# Patient Record
Sex: Female | Born: 1970 | Race: White | Hispanic: No | Smoking: Former smoker
Health system: Southern US, Community
[De-identification: ages and names within clinical notes are randomized; demographics above are authoritative.]

## PROBLEM LIST (undated history)

## (undated) DIAGNOSIS — G35 Multiple sclerosis: Secondary | ICD-10-CM

## (undated) DIAGNOSIS — G35D Multiple sclerosis, unspecified: Secondary | ICD-10-CM

## (undated) DIAGNOSIS — R42 Dizziness and giddiness: Secondary | ICD-10-CM

## (undated) DIAGNOSIS — R519 Headache, unspecified: Secondary | ICD-10-CM

## (undated) DIAGNOSIS — R200 Anesthesia of skin: Secondary | ICD-10-CM

## (undated) DIAGNOSIS — R202 Paresthesia of skin: Secondary | ICD-10-CM

## (undated) DIAGNOSIS — Z972 Presence of dental prosthetic device (complete) (partial): Secondary | ICD-10-CM

## (undated) DIAGNOSIS — M1990 Unspecified osteoarthritis, unspecified site: Secondary | ICD-10-CM

## (undated) DIAGNOSIS — E119 Type 2 diabetes mellitus without complications: Secondary | ICD-10-CM

## (undated) DIAGNOSIS — K219 Gastro-esophageal reflux disease without esophagitis: Secondary | ICD-10-CM

## (undated) DIAGNOSIS — G8918 Other acute postprocedural pain: Secondary | ICD-10-CM

## (undated) HISTORY — PX: TUMOR REMOVAL: SHX12

## (undated) HISTORY — PX: ABDOMINAL HYSTERECTOMY: SHX81

---
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
## 2016-11-23 DIAGNOSIS — G35 Multiple sclerosis: Secondary | ICD-10-CM | POA: Insufficient documentation

## 2016-11-23 DIAGNOSIS — G40309 Generalized idiopathic epilepsy and epileptic syndromes, not intractable, without status epilepticus: Secondary | ICD-10-CM | POA: Insufficient documentation

## 2016-11-23 DIAGNOSIS — R251 Tremor, unspecified: Secondary | ICD-10-CM | POA: Insufficient documentation

## 2016-12-21 ENCOUNTER — Other Ambulatory Visit: Payer: Self-pay | Admitting: Neurology

## 2016-12-21 DIAGNOSIS — G35 Multiple sclerosis: Secondary | ICD-10-CM

## 2017-01-04 ENCOUNTER — Ambulatory Visit: Payer: Medicaid Other

## 2017-01-09 ENCOUNTER — Ambulatory Visit: Payer: Medicaid Other | Attending: Neurology | Admitting: Physical Therapy

## 2017-01-10 ENCOUNTER — Ambulatory Visit
Admission: RE | Admit: 2017-01-10 | Discharge: 2017-01-10 | Disposition: A | Discharge status: Home / Self Care | Payer: Medicaid Other | Source: Ambulatory Visit | Attending: Neurology | Admitting: Neurology

## 2017-01-10 DIAGNOSIS — G35 Multiple sclerosis: Secondary | ICD-10-CM | POA: Diagnosis present

## 2017-01-10 DIAGNOSIS — G35D Multiple sclerosis, unspecified: Secondary | ICD-10-CM

## 2017-01-10 MED ORDER — GADOBENATE DIMEGLUMINE 529 MG/ML IV SOLN
20.0000 mL | Freq: Once | INTRAVENOUS | Status: AC | PRN
  Administered 2017-01-10: 16 mL via INTRAVENOUS

## 2017-01-30 DIAGNOSIS — G8929 Other chronic pain: Secondary | ICD-10-CM | POA: Insufficient documentation

## 2017-01-30 DIAGNOSIS — M533 Sacrococcygeal disorders, not elsewhere classified: Secondary | ICD-10-CM | POA: Insufficient documentation

## 2017-06-10 DIAGNOSIS — Z79899 Other long term (current) drug therapy: Secondary | ICD-10-CM | POA: Insufficient documentation

## 2017-06-18 ENCOUNTER — Other Ambulatory Visit: Payer: Self-pay | Admitting: Neurology

## 2017-06-18 DIAGNOSIS — M545 Low back pain: Secondary | ICD-10-CM

## 2017-07-02 ENCOUNTER — Ambulatory Visit
Admission: RE | Admit: 2017-07-02 | Discharge: 2017-07-02 | Disposition: A | Discharge status: Home / Self Care | Payer: Medicaid Other | Source: Ambulatory Visit | Attending: Neurology | Admitting: Neurology

## 2017-07-02 DIAGNOSIS — G8929 Other chronic pain: Secondary | ICD-10-CM | POA: Diagnosis not present

## 2017-07-02 DIAGNOSIS — M5136 Other intervertebral disc degeneration, lumbar region: Secondary | ICD-10-CM | POA: Insufficient documentation

## 2017-07-02 DIAGNOSIS — M5126 Other intervertebral disc displacement, lumbar region: Secondary | ICD-10-CM | POA: Insufficient documentation

## 2017-07-02 DIAGNOSIS — M5137 Other intervertebral disc degeneration, lumbosacral region: Secondary | ICD-10-CM | POA: Diagnosis not present

## 2017-07-02 DIAGNOSIS — M545 Low back pain: Secondary | ICD-10-CM

## 2017-11-12 ENCOUNTER — Ambulatory Visit: Payer: Medicaid Other | Admitting: Physical Therapy

## 2017-11-18 ENCOUNTER — Ambulatory Visit: Payer: Medicaid Other | Attending: Anesthesiology

## 2018-01-14 DIAGNOSIS — M792 Neuralgia and neuritis, unspecified: Secondary | ICD-10-CM | POA: Insufficient documentation

## 2018-01-14 DIAGNOSIS — F4321 Adjustment disorder with depressed mood: Secondary | ICD-10-CM | POA: Insufficient documentation

## 2018-01-14 DIAGNOSIS — F419 Anxiety disorder, unspecified: Secondary | ICD-10-CM | POA: Insufficient documentation

## 2018-01-14 DIAGNOSIS — F32A Depression, unspecified: Secondary | ICD-10-CM | POA: Insufficient documentation

## 2018-01-14 DIAGNOSIS — E559 Vitamin D deficiency, unspecified: Secondary | ICD-10-CM | POA: Insufficient documentation

## 2018-02-19 DIAGNOSIS — E1169 Type 2 diabetes mellitus with other specified complication: Secondary | ICD-10-CM | POA: Insufficient documentation

## 2018-02-19 DIAGNOSIS — E669 Obesity, unspecified: Secondary | ICD-10-CM

## 2018-05-19 DIAGNOSIS — D649 Anemia, unspecified: Secondary | ICD-10-CM | POA: Insufficient documentation

## 2018-08-18 ENCOUNTER — Emergency Department
Admission: EM | Admit: 2018-08-18 | Discharge: 2018-08-18 | Disposition: A | Discharge status: Home / Self Care | Payer: Medicaid Other | Attending: Emergency Medicine | Admitting: Emergency Medicine

## 2018-08-18 ENCOUNTER — Other Ambulatory Visit: Payer: Self-pay

## 2018-08-18 ENCOUNTER — Emergency Department: Payer: Medicaid Other

## 2018-08-18 DIAGNOSIS — S134XXA Sprain of ligaments of cervical spine, initial encounter: Secondary | ICD-10-CM | POA: Diagnosis not present

## 2018-08-18 DIAGNOSIS — E119 Type 2 diabetes mellitus without complications: Secondary | ICD-10-CM | POA: Insufficient documentation

## 2018-08-18 DIAGNOSIS — S299XXA Unspecified injury of thorax, initial encounter: Secondary | ICD-10-CM | POA: Diagnosis present

## 2018-08-18 DIAGNOSIS — Y92410 Unspecified street and highway as the place of occurrence of the external cause: Secondary | ICD-10-CM | POA: Insufficient documentation

## 2018-08-18 DIAGNOSIS — Z79899 Other long term (current) drug therapy: Secondary | ICD-10-CM | POA: Insufficient documentation

## 2018-08-18 DIAGNOSIS — Y999 Unspecified external cause status: Secondary | ICD-10-CM | POA: Diagnosis not present

## 2018-08-18 DIAGNOSIS — Z91040 Latex allergy status: Secondary | ICD-10-CM | POA: Diagnosis not present

## 2018-08-18 DIAGNOSIS — Y9389 Activity, other specified: Secondary | ICD-10-CM | POA: Diagnosis not present

## 2018-08-18 DIAGNOSIS — S20219A Contusion of unspecified front wall of thorax, initial encounter: Secondary | ICD-10-CM | POA: Insufficient documentation

## 2018-08-18 HISTORY — DX: Type 2 diabetes mellitus without complications: E11.9

## 2018-08-18 HISTORY — DX: Multiple sclerosis: G35

## 2018-08-18 HISTORY — DX: Multiple sclerosis, unspecified: G35.D

## 2018-08-18 LAB — COMPREHENSIVE METABOLIC PANEL
ALT: 35 U/L (ref 0–44)
ANION GAP: 10 (ref 5–15)
AST: 24 U/L (ref 15–41)
Albumin: 3.3 g/dL — ABNORMAL LOW (ref 3.5–5.0)
Alkaline Phosphatase: 102 U/L (ref 38–126)
BUN: 8 mg/dL (ref 6–20)
CHLORIDE: 99 mmol/L (ref 98–111)
CO2: 28 mmol/L (ref 22–32)
Calcium: 9.2 mg/dL (ref 8.9–10.3)
Creatinine, Ser: 0.72 mg/dL (ref 0.44–1.00)
GFR calc Af Amer: >60 mL/min (ref >60)
Glucose, Bld: 149 mg/dL — ABNORMAL HIGH (ref 70–99)
Potassium: 4.2 mmol/L (ref 3.5–5.1)
Sodium: 137 mmol/L (ref 135–145)
TOTAL PROTEIN: 8 g/dL (ref 6.5–8.1)
Total Bilirubin: 0.2 mg/dL — ABNORMAL LOW (ref 0.3–1.2)

## 2018-08-18 LAB — CBC WITH DIFFERENTIAL/PLATELET
Abs Immature Granulocytes: 0.05 10*3/uL (ref 0.00–0.07)
BASOS PCT: 1 %
Basophils Absolute: 0 10*3/uL (ref 0.0–0.1)
EOS ABS: 0.2 10*3/uL (ref 0.0–0.5)
EOS PCT: 2 %
HCT: 38.3 % (ref 36.0–46.0)
Hemoglobin: 11.9 g/dL — ABNORMAL LOW (ref 12.0–15.0)
IMMATURE GRANULOCYTES: 1 %
Lymphocytes Relative: 9 %
Lymphs Abs: 0.6 10*3/uL — ABNORMAL LOW (ref 0.7–4.0)
MCH: 26.6 pg (ref 26.0–34.0)
MCHC: 31.1 g/dL (ref 30.0–36.0)
MCV: 85.5 fL (ref 80.0–100.0)
MONOS PCT: 7 %
Monocytes Absolute: 0.5 10*3/uL (ref 0.1–1.0)
NEUTROS PCT: 80 %
Neutro Abs: 5.6 10*3/uL (ref 1.7–7.7)
PLATELETS: 226 10*3/uL (ref 150–400)
RBC: 4.48 MIL/uL (ref 3.87–5.11)
RDW: 20.2 % — AB (ref 11.5–15.5)
WBC: 6.9 10*3/uL (ref 4.0–10.5)
nRBC: 0 % (ref 0.0–0.2)

## 2018-08-18 MED ORDER — IOPAMIDOL (ISOVUE-300) INJECTION 61%
100.0000 mL | Freq: Once | INTRAVENOUS | Status: AC | PRN
  Administered 2018-08-18: 100 mL via INTRAVENOUS

## 2018-08-18 MED ORDER — SODIUM CHLORIDE 0.9 % IV BOLUS
1000.0000 mL | Freq: Once | INTRAVENOUS | Status: AC
  Administered 2018-08-18: 1000 mL via INTRAVENOUS

## 2018-08-18 MED ORDER — ONDANSETRON HCL 4 MG/2ML IJ SOLN
4.0000 mg | Freq: Once | INTRAMUSCULAR | Status: AC
  Administered 2018-08-18: 4 mg via INTRAVENOUS
  Filled 2018-08-18: qty 2

## 2018-08-18 MED ORDER — MORPHINE SULFATE (PF) 4 MG/ML IV SOLN
4.0000 mg | Freq: Once | INTRAVENOUS | Status: AC
  Administered 2018-08-18: 4 mg via INTRAVENOUS
  Filled 2018-08-18: qty 1
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
## 2018-08-18 NOTE — ED Notes (Signed)
Pt alert and oriented X4, active, cooperative, pt in NAD. RR even and unlabored, color WNL.  Pt informed to return if any life threatening symptoms occur.  Discharge and followup instructions reviewed. Given water and phone to call for ride.
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
## 2018-08-18 NOTE — ED Notes (Signed)
Pt sitting in stretcher. c-collar taken off by Dr. Clearnce Hasten

## 2018-08-18 NOTE — ED Notes (Signed)
Pt in CT.

## 2018-08-18 NOTE — ED Notes (Signed)
Pt sitting up in the bed, c collar remains on. Pt ambulated to and from bathroom while [facility] officer was in room per patient.

## 2018-08-18 NOTE — ED Notes (Signed)
SHP at bedside.

## 2018-08-18 NOTE — ED Notes (Signed)
ED Provider at bedside. 
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
## 2018-08-18 NOTE — ED Notes (Signed)
Pt requesting to use restroom, offered bedpan due to keeping patient still until CT are read for spine safety. Declines.
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
## 2018-11-27 ENCOUNTER — Encounter: Payer: Self-pay | Admitting: Nurse Practitioner

## 2018-11-27 ENCOUNTER — Other Ambulatory Visit: Payer: Self-pay

## 2018-11-27 ENCOUNTER — Ambulatory Visit: Payer: Medicaid Other | Attending: Nurse Practitioner | Admitting: Nurse Practitioner

## 2018-11-27 VITALS — BP 92/64 | HR 70 | Temp 97.7°F | Ht 61.0 in | Wt 217.0 lb

## 2018-11-27 DIAGNOSIS — G8929 Other chronic pain: Secondary | ICD-10-CM | POA: Insufficient documentation

## 2018-11-27 DIAGNOSIS — M79605 Pain in left leg: Secondary | ICD-10-CM | POA: Diagnosis present

## 2018-11-27 DIAGNOSIS — Z79899 Other long term (current) drug therapy: Secondary | ICD-10-CM | POA: Insufficient documentation

## 2018-11-27 DIAGNOSIS — G894 Chronic pain syndrome: Secondary | ICD-10-CM | POA: Insufficient documentation

## 2018-11-27 DIAGNOSIS — M5441 Lumbago with sciatica, right side: Secondary | ICD-10-CM

## 2018-11-27 DIAGNOSIS — M79604 Pain in right leg: Secondary | ICD-10-CM | POA: Diagnosis present

## 2018-11-27 DIAGNOSIS — M5442 Lumbago with sciatica, left side: Secondary | ICD-10-CM | POA: Diagnosis not present

## 2018-11-27 DIAGNOSIS — Z789 Other specified health status: Secondary | ICD-10-CM | POA: Insufficient documentation

## 2018-11-27 DIAGNOSIS — M899 Disorder of bone, unspecified: Secondary | ICD-10-CM | POA: Diagnosis present

## 2018-11-27 NOTE — Patient Instructions (Signed)
____________________________________________________________________________________________  Appointment Policy Summary  It is our goal and responsibility to provide the medical community with assistance in the evaluation and management of patients with chronic pain. Unfortunately our resources are limited. Because we do not have an unlimited amount of time, or available appointments, we are required to closely monitor and manage their use. The following rules exist to maximize their use:  Patient's responsibilities: 1. Punctuality:  At what time should I arrive? You should be physically present in our office 30 minutes before your scheduled appointment. Your scheduled appointment is with your assigned healthcare provider. However, it takes 5-10 minutes to be "checked-in", and another 15 minutes for the nurses to do the admission. If you arrive to our office at the time you were given for your appointment, you will end up being at least 20-25 minutes late to your appointment with the provider. 2. Tardiness:  What happens if I arrive only a few minutes after my scheduled appointment time? You will need to reschedule your appointment. The cutoff is your appointment time. This is why it is so important that you arrive at least 30 minutes before that appointment. If you have an appointment scheduled for 10:00 AM and you arrive at 10:01, you will be required to reschedule your appointment.  3. Plan ahead:  Always assume that you will encounter traffic on your way in. Plan for it. If you are dependent on a driver, make sure they understand these rules and the need to arrive early. 4. Other appointments and responsibilities:  Avoid scheduling any other appointments before or after your pain clinic appointments.  5. Be prepared:  Write down everything that you need to discuss with your healthcare provider and give this information to the admitting nurse. Write down the medications that you will need  refilled. Bring your pills and bottles (even the empty ones), to all of your appointments, except for those where a procedure is scheduled. 6. No children or pets:  Find someone to take care of them. It is not appropriate to bring them in. 7. Scheduling changes:  We request "advanced notification" of any changes or cancellations. 8. Advanced notification:  Defined as a time period of more than 24 hours prior to the originally scheduled appointment. This allows for the appointment to be offered to other patients. 9. Rescheduling:  When a visit is rescheduled, it will require the cancellation of the original appointment. For this reason they both fall within the category of "Cancellations".  10. Cancellations:  They require advanced notification. Any cancellation less than 24 hours before the  appointment will be recorded as a "No Show". 11. No Show:  Defined as an unkept appointment where the patient failed to notify or declare to the practice their intention or inability to keep the appointment.  Corrective process for repeat offenders:  1. Tardiness: Three (3) episodes of rescheduling due to late arrivals will be recorded as one (1) "No Show". 2. Cancellation or reschedule: Three (3) cancellations or rescheduling will be recorded as one (1) "No Show". 3. "No Shows": Three (3) "No Shows" within a 12 month period will result in discharge from the practice. ____________________________________________________________________________________________   ______________________________________________________________________________________________  Specialty Pain Scale  Introduction:  There are significant differences in how pain is reported. The word pain usually refers to physical pain, but it is also a common synonym of suffering. The medical community uses a scale from 0 (zero) to 10 (ten) to report pain level. Zero (0) is described as "no pain",   while ten (10) is described as "the worse pain  you can imagine". The problem with this scale is that physical pain is reported along with suffering. Suffering refers to mental pain, or more often yet it refers to any unpleasant feeling, emotion or aversion associated with the perception of harm or threat of harm. It is the psychological component of pain.  Pain Specialists prefer to separate the two components. The pain scale used by this practice is the Verbal Numerical Rating Scale (VNRS-11). This scale is for the physical pain only. DO NOT INCLUDE how your pain psychologically affects you. This scale is for adults 49 years of age and older. It has 11 (eleven) levels. The 1st level is 0/10. This means: "right now, I have no pain". In the context of pain management, it also means: "right now, my physical pain is under control with the current therapy".  General Information:  The scale should reflect your current level of pain. Unless you are specifically asked for the level of your worst pain, or your average pain. If you are asked for one of these two, then it should be understood that it is over the past 24 hours.  Levels 1 (one) through 5 (five) are described below, and can be treated as an outpatient. Ambulatory pain management facilities such as ours are more than adequate to treat these levels. Levels 6 (six) through 10 (ten) are also described below, however, these must be treated as a hospitalized patient. While levels 6 (six) and 7 (seven) may be evaluated at an urgent care facility, levels 8 (eight) through 10 (ten) constitute medical emergencies and as such, they belong in a hospital's emergency department. When having these levels (as described below), do not come to our office. Our facility is not equipped to manage these levels. Go directly to an urgent care facility or an emergency department to be evaluated.  Definitions:  Activities of Daily Living (ADL): Activities of daily living (ADL or ADLs) is a term used in healthcare to refer to  people's daily self-care activities. Health professionals often use a person's ability or inability to perform ADLs as a measurement of their functional status, particularly in regard to people post injury, with disabilities and the elderly. There are two ADL levels: Basic and Instrumental. Basic Activities of Daily Living (BADL  or BADLs) consist of self-care tasks that include: Bathing and showering; personal hygiene and grooming (including brushing/combing/styling hair); dressing; Toilet hygiene (getting to the toilet, cleaning oneself, and getting back up); eating and self-feeding (not including cooking or chewing and swallowing); functional mobility, often referred to as "transferring", as measured by the ability to walk, get in and out of bed, and get into and out of a chair; the broader definition (moving from one place to another while performing activities) is useful for people with different physical abilities who are still able to get around independently. Basic ADLs include the things many people do when they get up in the morning and get ready to go out of the house: get out of bed, go to the toilet, bathe, dress, groom, and eat. On the average, loss of function typically follows a particular order. Hygiene is the first to go, followed by loss of toilet use and locomotion. The last to go is the ability to eat. When there is only one remaining area in which the person is independent, there is a 62.9% chance that it is eating and only a 3.5% chance that it is hygiene. Instrumental Activities   of Daily Living (IADL or IADLs) are not necessary for fundamental functioning, but they let an individual live independently in a community. IADL consist of tasks that include: cleaning and maintaining the house; home establishment and maintenance; care of others (including selecting and supervising caregivers); care of pets; child rearing; managing money; managing financials (investments, etc.); meal preparation  and cleanup; shopping for groceries and necessities; moving within the community; safety procedures and emergency responses; health management and maintenance (taking prescribed medications); and using the telephone or other form of communication.  Instructions:  Most patients tend to report their pain as a combination of two factors, their physical pain and their psychosocial pain. This last one is also known as "suffering" and it is reflection of how physical pain affects you socially and psychologically. From now on, report them separately.  From this point on, when asked to report your pain level, report only your physical pain. Use the following table for reference.  Pain Clinic Pain Levels (0-5/10)  Pain Level Score  Description  No Pain 0   Mild pain 1 Nagging, annoying, but does not interfere with basic activities of daily living (ADL). Patients are able to eat, bathe, get dressed, toileting (being able to get on and off the toilet and perform personal hygiene functions), transfer (move in and out of bed or a chair without assistance), and maintain continence (able to control bladder and bowel functions). Blood pressure and heart rate are unaffected. A normal heart rate for a healthy adult ranges from 60 to 100 bpm (beats per minute).   Mild to moderate pain 2 Noticeable and distracting. Impossible to hide from other people. More frequent flare-ups. Still possible to adapt and function close to normal. It can be very annoying and may have occasional stronger flare-ups. With discipline, patients may get used to it and adapt.   Moderate pain 3 Interferes significantly with activities of daily living (ADL). It becomes difficult to feed, bathe, get dressed, get on and off the toilet or to perform personal hygiene functions. Difficult to get in and out of bed or a chair without assistance. Very distracting. With effort, it can be ignored when deeply involved in activities.   Moderately severe pain  4 Impossible to ignore for more than a few minutes. With effort, patients may still be able to manage work or participate in some social activities. Very difficult to concentrate. Signs of autonomic nervous system discharge are evident: dilated pupils (mydriasis); mild sweating (diaphoresis); sleep interference. Heart rate becomes elevated (>115 bpm). Diastolic blood pressure (lower number) rises above 100 mmHg. Patients find relief in laying down and not moving.   Severe pain 5 Intense and extremely unpleasant. Associated with frowning face and frequent crying. Pain overwhelms the senses.  Ability to do any activity or maintain social relationships becomes significantly limited. Conversation becomes difficult. Pacing back and forth is common, as getting into a comfortable position is nearly impossible. Pain wakes you up from deep sleep. Physical signs will be obvious: pupillary dilation; increased sweating; goosebumps; brisk reflexes; cold, clammy hands and feet; nausea, vomiting or dry heaves; loss of appetite; significant sleep disturbance with inability to fall asleep or to remain asleep. When persistent, significant weight loss is observed due to the complete loss of appetite and sleep deprivation.  Blood pressure and heart rate becomes significantly elevated. Caution: If elevated blood pressure triggers a pounding headache associated with blurred vision, then the patient should immediately seek attention at an urgent or emergency care unit, as   these may be signs of an impending stroke.    Emergency Department Pain Levels (6-10/10)  Emergency Room Pain 6 Severely limiting. Requires emergency care and should not be seen or managed at an outpatient pain management facility. Communication becomes difficult and requires great effort. Assistance to reach the emergency department may be required. Facial flushing and profuse sweating along with potentially dangerous increases in heart rate and blood pressure  will be evident.   Distressing pain 7 Self-care is very difficult. Assistance is required to transport, or use restroom. Assistance to reach the emergency department will be required. Tasks requiring coordination, such as bathing and getting dressed become very difficult.   Disabling pain 8 Self-care is no longer possible. At this level, pain is disabling. The individual is unable to do even the most "basic" activities such as walking, eating, bathing, dressing, transferring to a bed, or toileting. Fine motor skills are lost. It is difficult to think clearly.   Incapacitating pain 9 Pain becomes incapacitating. Thought processing is no longer possible. Difficult to remember your own name. Control of movement and coordination are lost.   The worst pain imaginable 10 At this level, most patients pass out from pain. When this level is reached, collapse of the autonomic nervous system occurs, leading to a sudden drop in blood pressure and heart rate. This in turn results in a temporary and dramatic drop in blood flow to the brain, leading to a loss of consciousness. Fainting is one of the body's self defense mechanisms. Passing out puts the brain in a calmed state and causes it to shut down for a while, in order to begin the healing process.    Summary: 1.   Refer to this scale when providing us with your pain level. 2.   Be accurate and careful when reporting your pain level. This will help with your care. 3.   Over-reporting your pain level will lead to loss of credibility. 4.   Even a level of 1/10 means that there is pain and will be treated at our facility. 5.   High, inaccurate reporting will be documented as "Symptom Exaggeration", leading to loss of credibility and suspicions of possible secondary gains such as obtaining more narcotics, or wanting to appear disabled, for fraudulent reasons. 6.   Only pain levels of 5 or below will be seen at our facility. 7.   Pain levels of 6 and above will be  sent to the Emergency Department and the appointment cancelled.  ______________________________________________________________________________________________     

## 2018-11-27 NOTE — Progress Notes (Addendum)
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 without complication ([place]) and MS (multiple sclerosis) ([place]). Family: family history is not on file. She was adopted.  Past Surgical History:  Procedure Laterality Date  . ABDOMINAL HYSTERECTOMY    . CESAREAN SECTION    . TUMOR REMOVAL Left    pt was 48 years old   Active Ambulatory Problems    Diagnosis Date Noted  . Anemia 05/19/2018  . Anxiety 01/14/2018  . Chronic back pain 12/05/2017  . Chronic low back pain without sciatica 07/19/2017  . Depression 01/14/2018  . Diabetes mellitus type 2 in obese ([place]) 02/19/2018  . Generalized seizure disorder ([place]) 11/23/2016  . Medication management 06/10/2017  . Multiple sclerosis ([place]) 11/23/2016  . Neuropathic pain 01/14/2018  . Pain disorder 06/10/2017  . Sacroiliac pain 01/30/2017  . Tremor 11/23/2016  . Vitamin D deficiency 01/14/2018  . Chronic pain syndrome 11/27/2018  . Chronic bilateral  low back pain with bilateral sciatica (Primary Area of Pain) (R>L) 11/27/2018  . Chronic pain of both lower extremities (Secondary Area of Pain) (R>L) 11/27/2018  . Long term prescription benzodiazepine use 11/27/2018  . Pharmacologic therapy 11/27/2018  . Disorder of skeletal system 11/27/2018  . Problems influencing health status 11/27/2018   Resolved Ambulatory Problems    Diagnosis Date Noted  . No Resolved Ambulatory Problems   Past Medical History:  Diagnosis Date  . Diabetes mellitus without complication ([place])   . MS (multiple sclerosis) ([place])    Constitutional Exam  General appearance: Well nourished, well developed, and well hydrated. In no apparent acute distress Vitals:   11/27/18 1331  BP: 92/64  Pulse: 70  Temp: 97.7 F (36.5 C)  SpO2: 94%  Weight: 217 lb (98.4 kg)  Height: _0  (1.549 m)   BMI Assessment: Estimated body mass index is 41 kg/m as calculated from the following:   Height as of this encounter: _1  (1.549 m).   Weight as of this encounter: 217 lb (98.4 kg).  BMI interpretation table: BMI level Category Range association with higher incidence of chronic pain  <18 kg/m2 Underweight   18.5-24.9 kg/m2 Ideal body weight   25-29.9 kg/m2 Overweight Increased incidence by 20%  30-34.9 kg/m2 Obese (Class I) Increased incidence by 68%  35-39.9 kg/m2 Severe obesity (Class II) Increased incidence by 136%  >40 kg/m2 Extreme obesity (Class III) Increased incidence by 254%   BMI Readings from Last 4 Encounters:  11/27/18 41.00 kg/m  08/18/18 42.51 kg/m   Wt Readings from Last 4 Encounters:  11/27/18 217 lb (98.4 kg)  08/18/18 225 lb (102.1 kg)  Psych/Mental status: Alert, oriented x 3 (person, place, & time) Ms. Cecilio speech  pattern and demeanor seems to suggest oversedation Eyes: PERLA Respiratory: No evidence of acute respiratory distress  Cervical Spine Exam  Inspection: No masses, redness, or swelling Alignment: Symmetrical Functional  ROM: Unrestricted ROM      Stability: No instability detected Muscle strength & Tone: Functionally intact Sensory: Unimpaired Palpation: No palpable anomalies              Upper Extremity (UE) Exam    Side: Right upper extremity  Side: Left upper extremity  Inspection: No masses, redness, swelling, or asymmetry. No contractures  Inspection: No masses, redness, swelling, or asymmetry. No contractures  Functional ROM: Unrestricted ROM          Functional ROM: Unrestricted ROM          Muscle strength & Tone: Functionally intact  Muscle strength & Tone: Functionally intact  Sensory: Unimpaired  Sensory: Unimpaired  Palpation: No palpable anomalies              Palpation: No palpable anomalies              Specialized Test(s): Deferred         Specialized Test(s): Deferred          Thoracic Spine Exam  Inspection: No masses, redness, or swelling Alignment: Symmetrical Functional ROM: Unrestricted ROM Stability: No instability detected Sensory: Unimpaired Muscle strength & Tone: No palpable anomalies  Lumbar Spine Exam  Inspection: No masses, redness, or swelling Alignment: Symmetrical Functional ROM: Unrestricted ROM      Stability: No instability detected Muscle strength & Tone: Functionally intact Sensory: Unimpaired Palpation: Complains of area being tender to palpation       Provocative Tests: Lumbar Hyperextension and rotation test: evaluation deferred today      due to patient unsteadiness Patrick's Maneuver: Non-diagnostic                    Gait & Posture Assessment  Ambulation: Unassisted Gait: Staggering Posture: Difficulty standing up straight, very unsteady  Lower Extremity Exam    Side: Right lower extremity  Side: Left lower extremity  Inspection: No masses, redness, swelling, or asymmetry. No contractures  Inspection: No masses, redness, swelling, or asymmetry. No contractures  Functional ROM: Unrestricted ROM          Functional ROM: Unrestricted ROM           Muscle strength & Tone: Functionally intact  Muscle strength & Tone: Functionally intact  Sensory: Unimpaired  Sensory: Unimpaired  Palpation: No palpable anomalies  Palpation: No palpable anomalies   Assessment  Primary Diagnosis & Pertinent Problem List: The primary encounter diagnosis was Chronic bilateral low back pain with bilateral sciatica (Primary Area of Pain) (R>L). Diagnoses of Chronic pain of both lower extremities (Secondary Area of Pain) (R>L), Long term prescription benzodiazepine use, Chronic pain syndrome, Pharmacologic therapy, Disorder of skeletal system, and Problems influencing health status were also pertinent to this visit.  Visit Diagnosis: 1. Chronic bilateral low back pain with bilateral sciatica (Primary Area of Pain) (R>L)   2. Chronic pain of both lower extremities (Secondary Area of Pain) (R>L)   3. Long term prescription benzodiazepine use   4. Chronic pain syndrome   5. Pharmacologic therapy   6. Disorder of skeletal system   7. Problems influencing health status    Plan of Care  Initial treatment plan:  Please be advised that as per protocol, today's visit has been an evaluation only. We have not taken over the patient's controlled substance management.  Problem-specific plan: No problem-specific Assessment & Plan notes found for this encounter.  Ordered Lab-work, Procedure(s), Referral(s), & Consult(s): Orders Placed This Encounter  Procedures  . Compliance Drug Analysis, Ur  . Comp. Metabolic Panel (12)  . Magnesium  . Vitamin B12  . Sedimentation rate  . 25-Hydroxyvitamin D Lcms D2+D3  . C-reactive protein  . Ambulatory referral to Physical Therapy   Pharmacotherapy: Medications ordered:  No orders of the defined types were placed in this encounter.  Medications administered during this visit: Itzell Mcaleer had no medications administered during this visit.   Pharmacotherapy under consideration:  Opioid Analgesics: The patient was  informed that there is no guarantee that she would be a candidate for opioid analgesics. The decision will be made following CDC guidelines. This decision will be based on the results of diagnostic studies, as well as Ms. Porr's risk profile.  Membrane stabilizer: To be determined at a later time Muscle relaxant: To be determined at a later time NSAID: To be determined at a later time Other analgesic(s): To be determined at a later time   Interventional therapies under consideration: Ms. Alia was informed that there is no guarantee that she would be a candidate for interventional therapies. The decision will be based on the results of diagnostic studies, as well as Ms. Sedlar's risk profile.  Possible procedure(s): None   Provider-requested follow-up: Return for 2nd Visit, w/ Dr. Dossie Arbour, medical record release.  Future Appointments  Date Time Provider [place]  12/17/2018  9:30 AM Milinda Pointer, MD [facility] None    Primary Care Physician: Marygrace Drought, MD Location: [facility] Note by:  Date: 11/27/2018; Time: 9:01 AM  Pain Score Disclaimer: We use the NRS-11 scale. This is a self-reported, subjective measurement of pain severity with only modest accuracy. It is used primarily to identify changes within a particular patient. It must be understood that outpatient pain scales are significantly less accurate that those used for research, where they can be applied under ideal controlled circumstances with minimal exposure to variables. In reality, the score is likely to be a combination of pain intensity and pain affect, where pain affect describes the degree of emotional arousal or changes in action readiness caused by the sensory experience of pain. Factors such as social and work situation, setting, emotional state, anxiety levels, expectation, and prior pain experience may influence pain perception and show large  inter-individual differences that may also be affected by time variables.  Patient instructions provided during this appointment: Patient Instructions   ____________________________________________________________________________________________  Appointment Policy Summary  It is our goal and responsibility to provide the medical community with assistance in the evaluation and management of patients with chronic pain. Unfortunately our resources are limited. Because we do not have an unlimited amount of time, or available appointments, we are required to closely monitor and manage their use. The following rules exist to maximize their use:  Patient's responsibilities: 1. Punctuality:  At what time should I arrive? You should be physically present in our office 30 minutes before your scheduled appointment. Your scheduled appointment is with your assigned healthcare provider. However, it takes 5-10 minutes to be "checked-in", and another 15 minutes for the nurses to do the admission. If you arrive to our office at the time you were given for your appointment, you will end up being at least 20-25 minutes late to your appointment with the provider. 2. Tardiness:  What happens if I arrive only a few minutes after my scheduled  appointment time? You will need to reschedule your appointment. The cutoff is your appointment time. This is why it is so important that you arrive at least 30 minutes before that appointment. If you have an appointment scheduled for 10:00 AM and you arrive at 10:01, you will be required to reschedule your appointment.  3. Plan ahead:  Always assume that you will encounter traffic on your way in. Plan for it. If you are dependent on a driver, make sure they understand these rules and the need to arrive early. 4. Other appointments and responsibilities:  Avoid scheduling any other appointments before or after your pain clinic appointments.  5. Be prepared:  Write down everything  that you need to discuss with your healthcare provider and give this information to the admitting nurse. Write down the medications that you will need refilled. Bring your pills and bottles (even the empty ones), to all of your appointments, except for those where a procedure is scheduled. 6. No children or pets:  Find someone to take care of them. It is not appropriate to bring them in. 7. Scheduling changes:  We request "advanced notification" of any changes or cancellations. 8. Advanced notification:  Defined as a time period of more than 24 hours prior to the originally scheduled appointment. This allows for the appointment to be offered to other patients. 9. Rescheduling:  When a visit is rescheduled, it will require the cancellation of the original appointment. For this reason they both fall within the category of "Cancellations".  10. Cancellations:  They require advanced notification. Any cancellation less than 24 hours before the  appointment will be recorded as a "No Show". 11. No Show:  Defined as an unkept appointment where the patient failed to notify or declare to the practice their intention or inability to keep the appointment.  Corrective process for repeat offenders:  1. Tardiness: Three (3) episodes of rescheduling due to late arrivals will be recorded as one (1) "No Show". 2. Cancellation or reschedule: Three (3) cancellations or rescheduling will be recorded as one (1) "No Show". 3. "No Shows": Three (3) "No Shows" within a 12 month period will result in discharge from the practice. ____________________________________________________________________________________________   ______________________________________________________________________________________________  Specialty Pain Scale  Introduction:  There are significant differences in how pain is reported. The word pain usually refers to physical pain, but it is also a common synonym of suffering. The medical  community uses a scale from 0 (zero) to 10 (ten) to report pain level. Zero (0) is described as "no pain", while ten (10) is described as "the worse pain you can imagine". The problem with this scale is that physical pain is reported along with suffering. Suffering refers to mental pain, or more often yet it refers to any unpleasant feeling, emotion or aversion associated with the perception of harm or threat of harm. It is the psychological component of pain.  Pain Specialists prefer to separate the two components. The pain scale used by this practice is the Verbal Numerical Rating Scale (VNRS-11). This scale is for the physical pain only. DO NOT INCLUDE how your pain psychologically affects you. This scale is for adults 7 years of age and older. It has 11 (eleven) levels. The 1st level is 0/10. This means: "right now, I have no pain". In the context of pain management, it also means: "right now, my physical pain is under control with the current therapy".  General Information:  The scale should reflect your current level of pain.  Unless you are specifically asked for the level of your worst pain, or your average pain. If you are asked for one of these two, then it should be understood that it is over the past 24 hours.  Levels 1 (one) through 5 (five) are described below, and can be treated as an outpatient. Ambulatory pain management facilities such as ours are more than adequate to treat these levels. Levels 6 (six) through 10 (ten) are also described below, however, these must be treated as a hospitalized patient. While levels 6 (six) and 7 (seven) may be evaluated at an urgent care facility, levels 8 (eight) through 10 (ten) constitute medical emergencies and as such, they belong in a hospital's emergency department. When having these levels (as described below), do not come to our office. Our facility is not equipped to manage these levels. Go directly to an urgent care facility or an emergency  department to be evaluated.  Definitions:  Activities of Daily Living (ADL): Activities of daily living (ADL or ADLs) is a term used in healthcare to refer to people's daily self-care activities. Health professionals often use a person's ability or inability to perform ADLs as a measurement of their functional status, particularly in regard to people post injury, with disabilities and the elderly. There are two ADL levels: Basic and Instrumental. Basic Activities of Daily Living (BADL  or BADLs) consist of self-care tasks that include: Bathing and showering; personal hygiene and grooming (including brushing/combing/styling hair); dressing; Toilet hygiene (getting to the toilet, cleaning oneself, and getting back up); eating and self-feeding (not including cooking or chewing and swallowing); functional mobility, often referred to as "transferring", as measured by the ability to walk, get in and out of bed, and get into and out of a chair; the broader definition (moving from one place to another while performing activities) is useful for people with different physical abilities who are still able to get around independently. Basic ADLs include the things many people do when they get up in the morning and get ready to go out of the house: get out of bed, go to the toilet, bathe, dress, groom, and eat. On the average, loss of function typically follows a particular order. Hygiene is the first to go, followed by loss of toilet use and locomotion. The last to go is the ability to eat. When there is only one remaining area in which the person is independent, there is a 62.9% chance that it is eating and only a 3.5% chance that it is hygiene. Instrumental Activities of Daily Living (IADL or IADLs) are not necessary for fundamental functioning, but they let an individual live independently in a community. IADL consist of tasks that include: cleaning and maintaining the house; home establishment and maintenance; care of  others (including selecting and supervising caregivers); care of pets; child rearing; managing money; managing financials (investments, etc.); meal preparation and cleanup; shopping for groceries and necessities; moving within the community; safety procedures and emergency responses; health management and maintenance (taking prescribed medications); and using the telephone or other form of communication.  Instructions:  Most patients tend to report their pain as a combination of two factors, their physical pain and their psychosocial pain. This last one is also known as "suffering" and it is reflection of how physical pain affects you socially and psychologically. From now on, report them separately.  From this point on, when asked to report your pain level, report only your physical pain. Use the following table for reference.  Pain Clinic Pain Levels (0-5/10)  Pain Level Score  Description  No Pain 0   Mild pain 1 Nagging, annoying, but does not interfere with basic activities of daily living (ADL). Patients are able to eat, bathe, get dressed, toileting (being able to get on and off the toilet and perform personal hygiene functions), transfer (move in and out of bed or a chair without assistance), and maintain continence (able to control bladder and bowel functions). Blood pressure and heart rate are unaffected. A normal heart rate for a healthy adult ranges from 60 to 100 bpm (beats per minute).   Mild to moderate pain 2 Noticeable and distracting. Impossible to hide from other people. More frequent flare-ups. Still possible to adapt and function close to normal. It can be very annoying and may have occasional stronger flare-ups. With discipline, patients may get used to it and adapt.   Moderate pain 3 Interferes significantly with activities of daily living (ADL). It becomes difficult to feed, bathe, get dressed, get on and off the toilet or to perform personal hygiene functions. Difficult to get  in and out of bed or a chair without assistance. Very distracting. With effort, it can be ignored when deeply involved in activities.   Moderately severe pain 4 Impossible to ignore for more than a few minutes. With effort, patients may still be able to manage work or participate in some social activities. Very difficult to concentrate. Signs of autonomic nervous system discharge are evident: dilated pupils (mydriasis); mild sweating (diaphoresis); sleep interference. Heart rate becomes elevated (>115 bpm). Diastolic blood pressure (lower number) rises above 100 mmHg. Patients find relief in laying down and not moving.   Severe pain 5 Intense and extremely unpleasant. Associated with frowning face and frequent crying. Pain overwhelms the senses.  Ability to do any activity or maintain social relationships becomes significantly limited. Conversation becomes difficult. Pacing back and forth is common, as getting into a comfortable position is nearly impossible. Pain wakes you up from deep sleep. Physical signs will be obvious: pupillary dilation; increased sweating; goosebumps; brisk reflexes; cold, clammy hands and feet; nausea, vomiting or dry heaves; loss of appetite; significant sleep disturbance with inability to fall asleep or to remain asleep. When persistent, significant weight loss is observed due to the complete loss of appetite and sleep deprivation.  Blood pressure and heart rate becomes significantly elevated. Caution: If elevated blood pressure triggers a pounding headache associated with blurred vision, then the patient should immediately seek attention at an urgent or emergency care unit, as these may be signs of an impending stroke.    Emergency Department Pain Levels (6-10/10)  Emergency Room Pain 6 Severely limiting. Requires emergency care and should not be seen or managed at an outpatient pain management facility. Communication becomes difficult and requires great effort. Assistance to  reach the emergency department may be required. Facial flushing and profuse sweating along with potentially dangerous increases in heart rate and blood pressure will be evident.   Distressing pain 7 Self-care is very difficult. Assistance is required to transport, or use restroom. Assistance to reach the emergency department will be required. Tasks requiring coordination, such as bathing and getting dressed become very difficult.   Disabling pain 8 Self-care is no longer possible. At this level, pain is disabling. The individual is unable to do even the most "basic" activities such as walking, eating, bathing, dressing, transferring to a bed, or toileting. Fine motor skills are lost. It is difficult to think clearly.  Incapacitating pain 9 Pain becomes incapacitating. Thought processing is no longer possible. Difficult to remember your own name. Control of movement and coordination are lost.   The worst pain imaginable 10 At this level, most patients pass out from pain. When this level is reached, collapse of the autonomic nervous system occurs, leading to a sudden drop in blood pressure and heart rate. This in turn results in a temporary and dramatic drop in blood flow to the brain, leading to a loss of consciousness. Fainting is one of the body's self defense mechanisms. Passing out puts the brain in a calmed state and causes it to shut down for a while, in order to begin the healing process.    Summary: 1. Refer to this scale when providing [place] with your pain level. 2. Be accurate and careful when reporting your pain level. This will help with your care. 3. Over-reporting your pain level will lead to loss of credibility. 4. Even a level of 1/10 means that there is pain and will be treated at our facility. 5. High, inaccurate reporting will be documented as "Symptom Exaggeration", leading to loss of credibility and suspicions of possible secondary gains such as obtaining more narcotics, or wanting  to appear disabled, for fraudulent reasons. 6. Only pain levels of 5 or below will be seen at our facility. 7. Pain levels of 6 and above will be sent to the Emergency Department and the appointment cancelled. ______________________________________________________________________________________________

## 2018-12-02 LAB — COMP. METABOLIC PANEL (12)
ALBUMIN: 4.1 g/dL (ref 3.8–4.8)
ALK PHOS: 91 IU/L (ref 39–117)
AST: 17 IU/L (ref 0–40)
Albumin/Globulin Ratio: 1.2 (ref 1.2–2.2)
BUN/Creatinine Ratio: 13 (ref 9–23)
BUN: 10 mg/dL (ref 6–24)
Bilirubin Total: <0.2 mg/dL (ref 0.0–1.2)
CALCIUM: 9.8 mg/dL (ref 8.7–10.2)
CREATININE: 0.79 mg/dL (ref 0.57–1.00)
Chloride: 102 mmol/L (ref 96–106)
GFR, EST AFRICAN AMERICAN: 102 mL/min/{1.73_m2} (ref >59)
GFR, EST NON AFRICAN AMERICAN: 89 mL/min/{1.73_m2} (ref >59)
GLOBULIN, TOTAL: 3.4 g/dL (ref 1.5–4.5)
GLUCOSE: 103 mg/dL — AB (ref 65–99)
Potassium: 4.1 mmol/L (ref 3.5–5.2)
SODIUM: 142 mmol/L (ref 134–144)
Total Protein: 7.5 g/dL (ref 6.0–8.5)

## 2018-12-02 LAB — 25-HYDROXY VITAMIN D LCMS D2+D3
25-Hydroxy, Vitamin D-3: 55 ng/mL
25-Hydroxy, Vitamin D: 55 ng/mL

## 2018-12-02 LAB — VITAMIN B12: VITAMIN B 12: 372 pg/mL (ref 232–1245)

## 2018-12-02 LAB — MAGNESIUM: Magnesium: 2.2 mg/dL (ref 1.6–2.3)

## 2018-12-02 LAB — C-REACTIVE PROTEIN: CRP: 10 mg/L (ref 0–10)

## 2018-12-02 LAB — SEDIMENTATION RATE: SED RATE: 42 mm/h — AB (ref 0–32)

## 2018-12-02 LAB — 25-HYDROXYVITAMIN D LCMS D2+D3

## 2018-12-02 LAB — COMPLIANCE DRUG ANALYSIS, UR

## 2018-12-16 NOTE — Progress Notes (Signed)
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 Tone/Strength: Functionally intact. No obvious neuro-muscular anomalies detected. Sensory (Neurological): Unimpaired Palpation: No palpable anomalies              Upper Extremity (UE) Exam    Side: Right upper extremity  Side: Left upper extremity  Skin & Extremity Inspection: Skin color, temperature, and hair growth are WNL. No peripheral edema or cyanosis. No masses, redness, swelling, asymmetry, or associated skin lesions. No contractures.  Skin & Extremity Inspection: Skin color, temperature, and hair growth are WNL. No peripheral edema or cyanosis. No masses, redness, swelling, asymmetry, or associated skin lesions. No contractures.  Functional ROM: Unrestricted ROM          Functional ROM: Unrestricted ROM          Muscle Tone/Strength: Functionally intact. No obvious neuro-muscular  anomalies detected.  Muscle Tone/Strength: Functionally intact. No obvious neuro-muscular anomalies detected.  Sensory (Neurological): Unimpaired          Sensory (Neurological): Unimpaired          Palpation: No palpable anomalies              Palpation: No palpable anomalies              Provocative Test(s):  Phalen's test: deferred Tinel's test: deferred Apley's scratch test (touch opposite shoulder):  Action 1 (Across chest): deferred Action 2 (Overhead): deferred Action 3 (LB reach): deferred   Provocative Test(s):  Phalen's test: deferred Tinel's test: deferred Apley's scratch test (touch opposite shoulder):  Action 1 (Across chest): deferred Action 2 (Overhead): deferred Action 3 (LB reach): deferred    Thoracic Spine Area Exam  Skin & Axial Inspection: No masses, redness, or swelling Alignment: Symmetrical Functional ROM: Unrestricted ROM Stability: No instability detected Muscle Tone/Strength: Functionally intact. No obvious neuro-muscular anomalies detected. Sensory (Neurological): Unimpaired Muscle strength & Tone: No palpable anomalies  Lumbar Spine Area Exam  Skin & Axial Inspection: No masses, redness, or swelling Alignment: Symmetrical Functional ROM: Minimal ROM affecting both sides Stability: No instability detected Muscle Tone/Strength: Increased muscle tone over affected area Sensory (Neurological): Movement-associated pain Palpation: Complains of area being tender to palpation       Provocative Tests: Hyperextension/rotation test: (+) bilaterally for facet joint pain. Lumbar quadrant test (Kemp's test): (+) bilaterally for facet joint pain. Lateral bending test: deferred today       Patrick's Maneuver: deferred today  FABER* test: deferred today                   S-I anterior distraction/compression test: deferred today         S-I lateral compression test: deferred today         S-I Thigh-thrust test: deferred today         S-I  Gaenslen's test: deferred today         *(Flexion, ABduction and External Rotation)  Gait & Posture Assessment  Ambulation: Limited Gait: Ataxic Posture: Antalgic   Lower Extremity Exam    Side: Right lower extremity  Side: Left lower extremity  Stability: No instability observed          Stability: No instability observed          Skin & Extremity Inspection: Skin color, temperature, and hair growth are WNL. No peripheral edema or cyanosis. No masses, redness, swelling, asymmetry, or associated skin lesions. No contractures.  Skin & Extremity Inspection: Skin color, temperature, and hair growth are WNL. No peripheral edema or cyanosis. No masses, redness, swelling, asymmetry, or associated skin lesions. No contractures.  Functional ROM: Unrestricted ROM                  Functional ROM: Unrestricted ROM                  Muscle Tone/Strength: Functionally intact. No obvious neuro-muscular anomalies detected.  Muscle Tone/Strength: Functionally intact. No obvious neuro-muscular anomalies detected.  Sensory (Neurological): Unimpaired        Sensory (Neurological): Unimpaired        DTR: Patellar: deferred today Achilles: deferred today Plantar: deferred today  DTR: Patellar: deferred today Achilles: deferred today Plantar: deferred today  Palpation: No palpable anomalies  Palpation: No palpable anomalies   Assessment & Plan  Primary Diagnosis & Pertinent Problem List: The primary encounter diagnosis was Chronic pain syndrome. Diagnoses of Multiple sclerosis ([place]), Chronic low back pain (Primary area of Pain) (Bilateral) (L>R) w/ sciatica (Bilateral), Chronic lower extremity pain (Secondary Area of Pain) (Bilateral) (R>L), Disorder of skeletal system, Pharmacologic therapy, Long term prescription benzodiazepine use, Problems influencing health status, Generalized seizure disorder ([place]), Muscle spasticity, and Neurogenic pain were also pertinent to this visit.  Visit Diagnosis: 1. Chronic  pain syndrome   2. Multiple sclerosis ([place])   3. Chronic low back pain (Primary area of Pain) (Bilateral) (L>R) w/ sciatica (Bilateral)   4. Chronic lower extremity pain (Secondary Area of Pain) (Bilateral) (R>L)   5. Disorder of skeletal system   6. Pharmacologic therapy   7. Long term prescription benzodiazepine use   8. Problems influencing health status   9. Generalized seizure disorder ([place])   10. Muscle spasticity   11. Neurogenic pain    Problems updated and reviewed during this visit: Problem  Chronic low back pain (Bilateral) (L>R) w/o sciatica  Muscle Spasticity  Chronic Pain Syndrome  Chronic low back pain (Primary area of Pain) (Bilateral) (L>R) w/ sciatica (Bilateral)  Chronic lower extremity pain (Secondary Area of Pain) (Bilateral) (R>L)  Neurogenic Pain  Sacroiliac Pain  Multiple Sclerosis ([facility])  Long Term Prescription Benzodiazepine Use  Pharmacologic Therapy  Disorder of Skeletal System  Problems Influencing Health Status  Vitamin D Deficiency  Medication Management  Generalized Seizure Disorder ([facility])  Anemia  Diabetes Mellitus Type 2 in Obese ([facility])  Anxiety  Depression  Tremor    Plan of Care  Pharmacotherapy (Medications Ordered): Meds ordered this encounter  Medications  .  baclofen (LIORESAL) 10 MG tablet    Sig: Take 2 tablets (20 mg total) by mouth 4 (four) times daily.    Dispense:  240 tablet    Refill:  2    Do not place medication on "Automatic Refill". Fill one day early if pharmacy is closed on scheduled refill date.  . gabapentin (NEURONTIN) 800 MG tablet    Sig: Take 1 tablet (800 mg total) by mouth every 8 (eight) hours.    Dispense:  90 tablet    Refill:  2    Do not place this medication, or any other prescription from our practice, on "Automatic Refill". Patient may have prescription filled one day early if pharmacy is closed on scheduled refill date.    Procedure Orders     LUMBAR FACET(MEDIAL BRANCH NERVE BLOCK) [facility] Lab  Orders  No laboratory test(s) ordered today   Imaging Orders  No imaging studies ordered today   Referral Orders  No referral(s) requested today   Pharmacological management options:  Opioid Analgesics: I will not be prescribing any opioids at this time Membrane stabilizer: Currently on a membrane stabilizer Muscle relaxant: Currently on a muscle relaxant NSAID: None prescribed at this time Other analgesic(s): None prescribed at this time   Interventional management options: Planned, scheduled, and/or pending:    Diagnostic bilateral lumbar facet block #1 under fluoroscopic guidance and IV seadation   Considering:   Diagnostic bilateral lumbar facet block  Possible bilateral lumbar facet RFA    PRN Procedures:   None at this time   Provider-requested follow-up: Return for Procedure (w/ sedation): (B) L-FCT BLK #1.  Future Appointments  Date Time Provider [place]  01/01/2019 12:45 PM Milinda Pointer, MD [facility] None    Primary Care Physician: Marygrace Drought, MD Location: [facility] Note by: [name], MD Date: 12/17/2018; Time: 5:32 PM

## 2018-12-17 ENCOUNTER — Encounter: Payer: Self-pay | Admitting: Pain Medicine

## 2018-12-17 ENCOUNTER — Ambulatory Visit: Payer: Medicaid Other | Attending: Pain Medicine | Admitting: Pain Medicine

## 2018-12-17 ENCOUNTER — Other Ambulatory Visit: Payer: Self-pay

## 2018-12-17 VITALS — BP 127/84 | HR 68 | Temp 98.7°F | Ht 61.0 in | Wt 215.0 lb

## 2018-12-17 DIAGNOSIS — G894 Chronic pain syndrome: Secondary | ICD-10-CM

## 2018-12-17 DIAGNOSIS — G35 Multiple sclerosis: Secondary | ICD-10-CM

## 2018-12-17 DIAGNOSIS — M79604 Pain in right leg: Secondary | ICD-10-CM

## 2018-12-17 DIAGNOSIS — M5441 Lumbago with sciatica, right side: Secondary | ICD-10-CM

## 2018-12-17 DIAGNOSIS — G40309 Generalized idiopathic epilepsy and epileptic syndromes, not intractable, without status epilepticus: Secondary | ICD-10-CM | POA: Diagnosis present

## 2018-12-17 DIAGNOSIS — M545 Low back pain: Secondary | ICD-10-CM

## 2018-12-17 DIAGNOSIS — M62838 Other muscle spasm: Secondary | ICD-10-CM | POA: Diagnosis present

## 2018-12-17 DIAGNOSIS — M79605 Pain in left leg: Secondary | ICD-10-CM

## 2018-12-17 DIAGNOSIS — M5442 Lumbago with sciatica, left side: Secondary | ICD-10-CM | POA: Diagnosis present

## 2018-12-17 DIAGNOSIS — M792 Neuralgia and neuritis, unspecified: Secondary | ICD-10-CM | POA: Diagnosis present

## 2018-12-17 DIAGNOSIS — M899 Disorder of bone, unspecified: Secondary | ICD-10-CM | POA: Diagnosis present

## 2018-12-17 DIAGNOSIS — Z789 Other specified health status: Secondary | ICD-10-CM

## 2018-12-17 DIAGNOSIS — Z79899 Other long term (current) drug therapy: Secondary | ICD-10-CM

## 2018-12-17 DIAGNOSIS — G8929 Other chronic pain: Secondary | ICD-10-CM

## 2018-12-17 MED ORDER — BACLOFEN 10 MG PO TABS: 20.0000 mg | ORAL_TABLET | Freq: Four times a day (QID) | ORAL | 2 refills | Status: DC

## 2018-12-17 MED ORDER — GABAPENTIN 800 MG PO TABS: 800.0000 mg | ORAL_TABLET | Freq: Three times a day (TID) | ORAL | 2 refills | Status: DC

## 2018-12-17 NOTE — Patient Instructions (Addendum)
______________________________________________________________________________________________  Specialty Pain Scale  Introduction:  There are significant differences in how pain is reported. The word pain usually refers to physical pain, but it is also a common synonym of suffering. The medical community uses a scale from 0 (zero) to 10 (ten) to report pain level. Zero (0) is described as "no pain", while ten (10) is described as "the worse pain you can imagine". The problem with this scale is that physical pain is reported along with suffering. Suffering refers to mental pain, or more often yet it refers to any unpleasant feeling, emotion or aversion associated with the perception of harm or threat of harm. It is the psychological component of pain.  Pain Specialists prefer to separate the two components. The pain scale used by this practice is the Verbal Numerical Rating Scale (VNRS-11). This scale is for the physical pain only. DO NOT INCLUDE how your pain psychologically affects you. This scale is for adults 21 years of age and older. It has 11 (eleven) levels. The 1st level is 0/10. This means: "right now, I have no pain". In the context of pain management, it also means: "right now, my physical pain is under control with the current therapy".  General Information:  The scale should reflect your current level of pain. Unless you are specifically asked for the level of your worst pain, or your average pain. If you are asked for one of these two, then it should be understood that it is over the past 24 hours.  Levels 1 (one) through 5 (five) are described below, and can be treated as an outpatient. Ambulatory pain management facilities such as ours are more than adequate to treat these levels. Levels 6 (six) through 10 (ten) are also described below, however, these must be treated as a hospitalized patient. While levels 6 (six) and 7 (seven) may be evaluated at an urgent care facility, levels 8  (eight) through 10 (ten) constitute medical emergencies and as such, they belong in a hospital's emergency department. When having these levels (as described below), do not come to our office. Our facility is not equipped to manage these levels. Go directly to an urgent care facility or an emergency department to be evaluated.  Definitions:  Activities of Daily Living (ADL): Activities of daily living (ADL or ADLs) is a term used in healthcare to refer to people's daily self-care activities. Health professionals often use a person's ability or inability to perform ADLs as a measurement of their functional status, particularly in regard to people post injury, with disabilities and the elderly. There are two ADL levels: Basic and Instrumental. Basic Activities of Daily Living (BADL  or BADLs) consist of self-care tasks that include: Bathing and showering; personal hygiene and grooming (including brushing/combing/styling hair); dressing; Toilet hygiene (getting to the toilet, cleaning oneself, and getting back up); eating and self-feeding (not including cooking or chewing and swallowing); functional mobility, often referred to as "transferring", as measured by the ability to walk, get in and out of bed, and get into and out of a chair; the broader definition (moving from one place to another while performing activities) is useful for people with different physical abilities who are still able to get around independently. Basic ADLs include the things many people do when they get up in the morning and get ready to go out of the house: get out of bed, go to the toilet, bathe, dress, groom, and eat. On the average, loss of function typically follows a particular order.   Hygiene is the first to go, followed by loss of toilet use and locomotion. The last to go is the ability to eat. When there is only one remaining area in which the person is independent, there is a 62.9% chance that it is eating and only a 3.5% chance  that it is hygiene. Instrumental Activities of Daily Living (IADL or IADLs) are not necessary for fundamental functioning, but they let an individual live independently in a community. IADL consist of tasks that include: cleaning and maintaining the house; home establishment and maintenance; care of others (including selecting and supervising caregivers); care of pets; child rearing; managing money; managing financials (investments, etc.); meal preparation and cleanup; shopping for groceries and necessities; moving within the community; safety procedures and emergency responses; health management and maintenance (taking prescribed medications); and using the telephone or other form of communication.  Instructions:  Most patients tend to report their pain as a combination of two factors, their physical pain and their psychosocial pain. This last one is also known as "suffering" and it is reflection of how physical pain affects you socially and psychologically. From now on, report them separately.  From this point on, when asked to report your pain level, report only your physical pain. Use the following table for reference.  Pain Clinic Pain Levels (0-5/10)  Pain Level Score  Description  No Pain 0   Mild pain 1 Nagging, annoying, but does not interfere with basic activities of daily living (ADL). Patients are able to eat, bathe, get dressed, toileting (being able to get on and off the toilet and perform personal hygiene functions), transfer (move in and out of bed or a chair without assistance), and maintain continence (able to control bladder and bowel functions). Blood pressure and heart rate are unaffected. A normal heart rate for a healthy adult ranges from 60 to 100 bpm (beats per minute).   Mild to moderate pain 2 Noticeable and distracting. Impossible to hide from other people. More frequent flare-ups. Still possible to adapt and function close to normal. It can be very annoying and may have  occasional stronger flare-ups. With discipline, patients may get used to it and adapt.   Moderate pain 3 Interferes significantly with activities of daily living (ADL). It becomes difficult to feed, bathe, get dressed, get on and off the toilet or to perform personal hygiene functions. Difficult to get in and out of bed or a chair without assistance. Very distracting. With effort, it can be ignored when deeply involved in activities.   Moderately severe pain 4 Impossible to ignore for more than a few minutes. With effort, patients may still be able to manage work or participate in some social activities. Very difficult to concentrate. Signs of autonomic nervous system discharge are evident: dilated pupils (mydriasis); mild sweating (diaphoresis); sleep interference. Heart rate becomes elevated (>115 bpm). Diastolic blood pressure (lower number) rises above 100 mmHg. Patients find relief in laying down and not moving.   Severe pain 5 Intense and extremely unpleasant. Associated with frowning face and frequent crying. Pain overwhelms the senses.  Ability to do any activity or maintain social relationships becomes significantly limited. Conversation becomes difficult. Pacing back and forth is common, as getting into a comfortable position is nearly impossible. Pain wakes you up from deep sleep. Physical signs will be obvious: pupillary dilation; increased sweating; goosebumps; brisk reflexes; cold, clammy hands and feet; nausea, vomiting or dry heaves; loss of appetite; significant sleep disturbance with inability to fall asleep or to   remain asleep. When persistent, significant weight loss is observed due to the complete loss of appetite and sleep deprivation.  Blood pressure and heart rate becomes significantly elevated. Caution: If elevated blood pressure triggers a pounding headache associated with blurred vision, then the patient should immediately seek attention at an urgent or emergency care unit, as  these may be signs of an impending stroke.    Emergency Department Pain Levels (6-10/10)  Emergency Room Pain 6 Severely limiting. Requires emergency care and should not be seen or managed at an outpatient pain management facility. Communication becomes difficult and requires great effort. Assistance to reach the emergency department may be required. Facial flushing and profuse sweating along with potentially dangerous increases in heart rate and blood pressure will be evident.   Distressing pain 7 Self-care is very difficult. Assistance is required to transport, or use restroom. Assistance to reach the emergency department will be required. Tasks requiring coordination, such as bathing and getting dressed become very difficult.   Disabling pain 8 Self-care is no longer possible. At this level, pain is disabling. The individual is unable to do even the most "basic" activities such as walking, eating, bathing, dressing, transferring to a bed, or toileting. Fine motor skills are lost. It is difficult to think clearly.   Incapacitating pain 9 Pain becomes incapacitating. Thought processing is no longer possible. Difficult to remember your own name. Control of movement and coordination are lost.   The worst pain imaginable 10 At this level, most patients pass out from pain. When this level is reached, collapse of the autonomic nervous system occurs, leading to a sudden drop in blood pressure and heart rate. This in turn results in a temporary and dramatic drop in blood flow to the brain, leading to a loss of consciousness. Fainting is one of the body's self defense mechanisms. Passing out puts the brain in a calmed state and causes it to shut down for a while, in order to begin the healing process.    Summary: 1. Refer to this scale when providing [place] with your pain level. 2. Be accurate and careful when reporting your pain level. This will help with your care. 3. Over-reporting your pain level will  lead to loss of credibility. 4. Even a level of 1/10 means that there is pain and will be treated at our facility. 5. High, inaccurate reporting will be documented as "Symptom Exaggeration", leading to loss of credibility and suspicions of possible secondary gains such as obtaining more narcotics, or wanting to appear disabled, for fraudulent reasons. 6. Only pain levels of 5 or below will be seen at our facility. 7. Pain levels of 6 and above will be sent to the Emergency Department and the appointment cancelled. ______________________________________________________________________________________________   ____________________________________________________________________________________________  Preparing for Procedure with Sedation  Instructions: . Oral Intake: Do not eat or drink anything for at least 8 hours prior to your procedure. . Transportation: Public transportation is not allowed. Bring an adult driver. The driver must be physically present in our waiting room before any procedure can be started. Marland Kitchen Physical Assistance: Bring an adult physically capable of assisting you, in the event you need help. This adult should keep you company at home for at least 6 hours after the procedure. . Blood Pressure Medicine: Take your blood pressure medicine with a sip of water the morning of the procedure. . Blood thinners: Notify our staff if you are taking any blood thinners. Depending on which one you take, there will be  specific instructions on how and when to stop it. . Diabetics on insulin: Notify the staff so that you can be scheduled 1st case in the morning. If your diabetes requires high dose insulin, take only  of your normal insulin dose the morning of the procedure and notify the staff that you have done so. . Preventing infections: Shower with an antibacterial soap the morning of your procedure. . Build-up your immune system: Take 1000 mg of Vitamin C with every meal (3 times a day)  the day prior to your procedure. Marland Kitchen Antibiotics: Inform the staff if you have a condition or reason that requires you to take antibiotics before dental procedures. . Pregnancy: If you are pregnant, call and cancel the procedure. . Sickness: If you have a cold, fever, or any active infections, call and cancel the procedure. . Arrival: You must be in the facility at least 30 minutes prior to your scheduled procedure. . Children: Do not bring children with you. . Dress appropriately: Bring dark clothing that you would not mind if they get stained. . Valuables: Do not bring any jewelry or valuables.  Procedure appointments are reserved for interventional treatments only. Marland Kitchen No Prescription Refills. . No medication changes will be discussed during procedure appointments. . No disability issues will be discussed.  Reasons to call and reschedule or cancel your procedure: (Following these recommendations will minimize the risk of a serious complication.) . Surgeries: Avoid having procedures within 2 weeks of any surgery. (Avoid for 2 weeks before or after any surgery). . Flu Shots: Avoid having procedures within 2 weeks of a flu shots or . (Avoid for 2 weeks before or after immunizations). . Barium: Avoid having a procedure within 7-10 days after having had a radiological study involving the use of radiological contrast. (Myelograms, Barium swallow or enema study). . Heart attacks: Avoid any elective procedures or surgeries for the initial 6 months after a "Myocardial Infarction" (Heart Attack). . Blood thinners: It is imperative that you stop these medications before procedures. Let us know if you if you take any blood thinner.  . Infection: Avoid procedures during or within two weeks of an infection (including chest colds or gastrointestinal problems). Symptoms associated with infections include: Localized redness, fever, chills, night sweats or profuse sweating, burning sensation when voiding, cough,  congestion, stuffiness, runny nose, sore throat, diarrhea, nausea, vomiting, cold or Flu symptoms, recent or current infections. It is specially important if the infection is over the area that we intend to treat. Marland Kitchen Heart and lung problems: Symptoms that may suggest an active cardiopulmonary problem include: cough, chest pain, breathing difficulties or shortness of breath, dizziness, ankle swelling, uncontrolled high or unusually low blood pressure, and/or palpitations. If you are experiencing any of these symptoms, cancel your procedure and contact your primary care physician for an evaluation.  Remember:  Regular Business hours are:  Monday to Thursday 8:00 AM to 4:00 PM  Provider's Schedule: Milinda Pointer, MD:  Procedure days: Tuesday and Thursday 7:30 AM to 4:00 PM  Gillis Santa, MD:  Procedure days: Monday and Wednesday 7:30 AM to 4:00 PM ____________________________________________________________________________________________   Facet Blocks Patient Information  Description: The facets are joints in the spine between the vertebrae.  Like any joints in the body, facets can become irritated and painful.  Arthritis can also effect the facets.  By injecting steroids and local anesthetic in and around these joints, we can temporarily block the nerve supply to them.  Steroids act directly on irritated nerves and  tissues to reduce selling and inflammation which often leads to decreased pain.  Facet blocks may be done anywhere along the spine from the neck to the low back depending upon the location of your pain.   After numbing the skin with local anesthetic (like Novocaine), a small needle is passed onto the facet joints under x-ray guidance.  You may experience a sensation of pressure while this is being done.  The entire block usually lasts about 15-25 minutes.   Conditions which may be treated by facet blocks:   Low back/buttock pain  Neck/shoulder pain  Certain types of  headaches  Preparation for the injection:  1. Do not eat any solid food or dairy products within 8 hours of your appointment. 2. You may drink clear liquid up to 3 hours before appointment.  Clear liquids include water, black coffee, juice or soda.  No milk or cream please. 3. You may take your regular medication, including pain medications, with a sip of water before your appointment.  Diabetics should hold regular insulin (if taken separately) and take 1/2 normal NPH dose the morning of the procedure.  Carry some sugar containing items with you to your appointment. 4. A driver must accompany you and be prepared to drive you home after your procedure. 5. Bring all your current medications with you. 6. An IV may be inserted and sedation may be given at the discretion of the physician. 7. A blood pressure cuff, EKG and other monitors will often be applied during the procedure.  Some patients may need to have extra oxygen administered for a short period. 8. You will be asked to provide medical information, including your allergies and medications, prior to the procedure.  We must know immediately if you are taking blood thinners (like Coumadin/Warfarin) or if you are allergic to IV iodine contrast (dye).  We must know if you could possible be pregnant.  Possible side-effects:   Bleeding from needle site  Infection (rare, may require surgery)  Nerve injury (rare)  Numbness & tingling (temporary)  Difficulty urinating (rare, temporary)  Spinal headache (a headache worse with upright posture)  Light-headedness (temporary)  Pain at injection site (serveral days)  Decreased blood pressure (rare, temporary)  Weakness in arm/leg (temporary)  Pressure sensation in back/neck (temporary)   Call if you experience:   Fever/chills associated with headache or increased back/neck pain  Headache worsened by an upright position  New onset, weakness or numbness of an extremity below the  injection site  Hives or difficulty breathing (go to the emergency room)  Inflammation or drainage at the injection site(s)  Severe back/neck pain greater than usual  New symptoms which are concerning to you  Please note:  Although the local anesthetic injected can often make your back or neck feel good for several hours after the injection, the pain will likely return. It takes 3-7 days for steroids to work.  You may not notice any pain relief for at least one week.  If effective, we will often do a series of 2-3 injections spaced 3-6 weeks apart to maximally decrease your pain.  After the initial series, you may be a candidate for a more permanent nerve block of the facets.  If you have any questions, please call #[phone]) [facility]

## 2019-01-01 ENCOUNTER — Ambulatory Visit
Admission: RE | Admit: 2019-01-01 | Discharge: 2019-01-01 | Disposition: A | Discharge status: Home / Self Care | Payer: Medicaid Other | Source: Ambulatory Visit | Attending: Pain Medicine | Admitting: Pain Medicine

## 2019-01-01 ENCOUNTER — Encounter: Payer: Self-pay | Admitting: Pain Medicine

## 2019-01-01 ENCOUNTER — Encounter (INDEPENDENT_AMBULATORY_CARE_PROVIDER_SITE_OTHER): Payer: Self-pay

## 2019-01-01 ENCOUNTER — Other Ambulatory Visit: Payer: Self-pay

## 2019-01-01 ENCOUNTER — Ambulatory Visit (HOSPITAL_BASED_OUTPATIENT_CLINIC_OR_DEPARTMENT_OTHER): Payer: Medicaid Other | Admitting: Pain Medicine

## 2019-01-01 VITALS — BP 95/54 | HR 78 | Temp 97.7°F | Resp 16 | Ht 61.0 in | Wt 215.0 lb

## 2019-01-01 DIAGNOSIS — M545 Low back pain: Secondary | ICD-10-CM

## 2019-01-01 DIAGNOSIS — M5137 Other intervertebral disc degeneration, lumbosacral region: Secondary | ICD-10-CM | POA: Insufficient documentation

## 2019-01-01 DIAGNOSIS — G8929 Other chronic pain: Secondary | ICD-10-CM | POA: Diagnosis present

## 2019-01-01 DIAGNOSIS — R937 Abnormal findings on diagnostic imaging of other parts of musculoskeletal system: Secondary | ICD-10-CM | POA: Insufficient documentation

## 2019-01-01 DIAGNOSIS — M51379 Other intervertebral disc degeneration, lumbosacral region without mention of lumbar back pain or lower extremity pain: Secondary | ICD-10-CM

## 2019-01-01 DIAGNOSIS — M47817 Spondylosis without myelopathy or radiculopathy, lumbosacral region: Secondary | ICD-10-CM | POA: Insufficient documentation

## 2019-01-01 DIAGNOSIS — M47816 Spondylosis without myelopathy or radiculopathy, lumbar region: Secondary | ICD-10-CM | POA: Insufficient documentation

## 2019-01-01 MED ORDER — FENTANYL CITRATE (PF) 100 MCG/2ML IJ SOLN
25.0000 ug | INTRAMUSCULAR | Status: DC | PRN
  Administered 2019-01-01: 50 ug via INTRAVENOUS
  Filled 2019-01-01: qty 2

## 2019-01-01 MED ORDER — TRIAMCINOLONE ACETONIDE 40 MG/ML IJ SUSP
80.0000 mg | Freq: Once | INTRAMUSCULAR | Status: AC
  Administered 2019-01-01: 80 mg
  Filled 2019-01-01: qty 2

## 2019-01-01 MED ORDER — ROPIVACAINE HCL 2 MG/ML IJ SOLN
18.0000 mL | Freq: Once | INTRAMUSCULAR | Status: AC
  Administered 2019-01-01: 18 mL via PERINEURAL
  Filled 2019-01-01: qty 20

## 2019-01-01 MED ORDER — MIDAZOLAM HCL 5 MG/5ML IJ SOLN
1.0000 mg | INTRAMUSCULAR | Status: DC | PRN
  Administered 2019-01-01: 3 mg via INTRAVENOUS
  Filled 2019-01-01: qty 5

## 2019-01-01 MED ORDER — LACTATED RINGERS IV SOLN
1000.0000 mL | Freq: Once | INTRAVENOUS | Status: AC
  Administered 2019-01-01: 1000 mL via INTRAVENOUS

## 2019-01-01 MED ORDER — LIDOCAINE HCL 2 % IJ SOLN
20.0000 mL | Freq: Once | INTRAMUSCULAR | Status: AC
  Administered 2019-01-01: 400 mg
  Filled 2019-01-01: qty 40

## 2019-01-01 NOTE — Progress Notes (Signed)
Patient's Name: Shannon Benitez  MRN: [id number]  Referring Provider: Marygrace Drought, MD  DOB: [birth year]  PCP: Marygrace Drought, MD  DOS: 01/01/2019  Note by: [name], MD  Service setting: Ambulatory outpatient  Specialty: Interventional Pain Management  Patient type: Established  Location: [facility] (AMB) Pain Management Facility  Visit type: Interventional Procedure   Primary Reason for Visit: Interventional Pain Management Treatment. CC: Back Pain (low)  Procedure:          Anesthesia, Analgesia, Anxiolysis:  Type: Lumbar Facet, Medial Branch Block(s) #1  Primary Purpose: Diagnostic Region: Posterolateral Lumbosacral Spine Level: L2, L3, L4, L5, & S1 Medial Branch Level(s). Injecting these levels blocks the L3-4, L4-5, and L5-S1 lumbar facet joints. Laterality: Bilateral  Type: Moderate (Conscious) Sedation combined with Local Anesthesia Indication(s): Analgesia and Anxiety Route: Intravenous (IV) IV Access: Secured Sedation: Meaningful verbal contact was maintained at all times during the procedure  Local Anesthetic: Lidocaine 1-2%  Position: Prone   Indications: 1. Spondylosis without myelopathy or radiculopathy, lumbosacral region   2. Lumbar facet syndrome (Bilateral) (L>R)   3. Lumbar facet hypertrophy (Multilevel) (Bilateral)   4. Osteoarthritis of facet joint of lumbar spine   5. Osteoarthritis of lumbar spine   6. DDD (degenerative disc disease), lumbosacral   7. Lumbar spondylosis   8. Chronic low back pain (Bilateral) (L>R) w/o sciatica   9. Abnormal MRI, lumbar spine (07/02/2017)    Pain Score: Pre-procedure: 5 /10 Post-procedure: 0-No pain/10  Pre-op Assessment:  Ms. Lingle is a 48 y.o. (year old), female patient, seen today for interventional treatment. She  has a past surgical history that includes Abdominal hysterectomy; Cesarean section; and Tumor removal (Left). Ms. Olberding has a current medication list which includes the following  prescription(s): baclofen, clonazepam, dimethyl fumarate, duloxetine, gabapentin, gabapentin, lamotrigine, metformin, omeprazole, primidone, propranolol, sitagliptin, and solifenacin, and the following Facility-Administered Medications: fentanyl and midazolam. Her primarily concern today is the Back Pain (low)  Initial Vital Signs:  Pulse/HCG Rate: 77ECG Heart Rate: 72 Temp: 97.6 F (36.4 C) Resp: 16 BP: 117/70 SpO2: 99 %  BMI: Estimated body mass index is 40.62 kg/m as calculated from the following:   Height as of this encounter: 5\' 1"  (1.549 m).   Weight as of this encounter: 215 lb (97.5 kg).  Risk Assessment: Allergies: Reviewed. She is allergic to glatiramer; latex; and rebif [interferon beta-1a].  Allergy Precautions: None required Coagulopathies: Reviewed. None identified.  Blood-thinner therapy: None at this time Active Infection(s): Reviewed. None identified. Ms. Mcmurphy is afebrile  Site Confirmation: Ms. Fritzsche was asked to confirm the procedure and laterality before marking the site Procedure checklist: Completed Consent: Before the procedure and under the influence of no sedative(s), amnesic(s), or anxiolytics, the patient was informed of the treatment options, risks and possible complications. To fulfill our ethical and legal obligations, as recommended by the American Medical Association's Code of Ethics, I have informed the patient of my clinical impression; the nature and purpose of the treatment or procedure; the risks, benefits, and possible complications of the intervention; the alternatives, including doing nothing; the risk(s) and benefit(s) of the alternative treatment(s) or procedure(s); and the risk(s) and benefit(s) of doing nothing. The patient was provided information about the general risks and possible complications associated with the procedure. These may include, but are not limited to: failure to achieve desired goals, infection, bleeding, organ or  nerve damage, allergic reactions, paralysis, and death. In addition, the patient was informed of those risks and complications associated to Spine-related procedures,  such as failure to decrease pain; infection (i.e.: Meningitis, epidural or intraspinal abscess); bleeding (i.e.: epidural hematoma, subarachnoid hemorrhage, or any other type of intraspinal or peri-dural bleeding); organ or nerve damage (i.e.: Any type of peripheral nerve, nerve root, or spinal cord injury) with subsequent damage to sensory, motor, and/or autonomic systems, resulting in permanent pain, numbness, and/or weakness of one or several areas of the body; allergic reactions; (i.e.: anaphylactic reaction); and/or death. Furthermore, the patient was informed of those risks and complications associated with the medications. These include, but are not limited to: allergic reactions (i.e.: anaphylactic or anaphylactoid reaction(s)); adrenal axis suppression; blood sugar elevation that in diabetics may result in ketoacidosis or comma; water retention that in patients with history of congestive heart failure may result in shortness of breath, pulmonary edema, and decompensation with resultant heart failure; weight gain; swelling or edema; medication-induced neural toxicity; particulate matter embolism and blood vessel occlusion with resultant organ, and/or nervous system infarction; and/or aseptic necrosis of one or more joints. Finally, the patient was informed that Medicine is not an exact science; therefore, there is also the possibility of unforeseen or unpredictable risks and/or possible complications that may result in a catastrophic outcome. The patient indicated having understood very clearly. We have given the patient no guarantees and we have made no promises. Enough time was given to the patient to ask questions, all of which were answered to the patient's satisfaction. Ms. Konicki has indicated that she wanted to continue with the  procedure. Attestation: I, the ordering provider, attest that I have discussed with the patient the benefits, risks, side-effects, alternatives, likelihood of achieving goals, and potential problems during recovery for the procedure that I have provided informed consent. Date  Time: 01/01/2019 12:38 PM  Pre-Procedure Preparation:  Monitoring: As per clinic protocol. Respiration, ETCO2, SpO2, BP, heart rate and rhythm monitor placed and checked for adequate function Safety Precautions: Patient was assessed for positional comfort and pressure points before starting the procedure. Time-out: I initiated and conducted the "Time-out" before starting the procedure, as per protocol. The patient was asked to participate by confirming the accuracy of the "Time Out" information. Verification of the correct person, site, and procedure were performed and confirmed by me, the nursing staff, and the patient. "Time-out" conducted as per Joint Commission's Universal Protocol (UP.01.01.01). Time: 1328  Description of Procedure:          Laterality: Bilateral. The procedure was performed in identical fashion on both sides. Levels:  L2, L3, L4, L5, & S1 Medial Branch Level(s) Area Prepped: Posterior Lumbosacral Region Prepping solution: ChloraPrep (2% chlorhexidine gluconate and 70% isopropyl alcohol) Safety Precautions: Aspiration looking for blood return was conducted prior to all injections. At no point did we inject any substances, as a needle was being advanced. Before injecting, the patient was told to immediately notify me if she was experiencing any new onset of "ringing in the ears, or metallic taste in the mouth". No attempts were made at seeking any paresthesias. Safe injection practices and needle disposal techniques used. Medications properly checked for expiration dates. SDV (single dose vial) medications used. After the completion of the procedure, all disposable equipment used was discarded in the proper  designated medical waste containers. Local Anesthesia: Protocol guidelines were followed. The patient was positioned over the fluoroscopy table. The area was prepped in the usual manner. The time-out was completed. The target area was identified using fluoroscopy. A 12-in long, straight, sterile hemostat was used with fluoroscopic guidance to locate the  targets for each level blocked. Once located, the skin was marked with an approved surgical skin marker. Once all sites were marked, the skin (epidermis, dermis, and hypodermis), as well as deeper tissues (fat, connective tissue and muscle) were infiltrated with a small amount of a short-acting local anesthetic, loaded on a 10cc syringe with a 25G, 1.5-in  Needle. An appropriate amount of time was allowed for local anesthetics to take effect before proceeding to the next step. Local Anesthetic: Lidocaine 2.0% The unused portion of the local anesthetic was discarded in the proper designated containers. Technical explanation of process:  L2 Medial Branch Nerve Block (MBB): The target area for the L2 medial branch is at the junction of the postero-lateral aspect of the superior articular process and the superior, posterior, and medial edge of the transverse process of L3. Under fluoroscopic guidance, a Quincke needle was inserted until contact was made with os over the superior postero-lateral aspect of the pedicular shadow (target area). After negative aspiration for blood, 0.5 mL of the nerve block solution was injected without difficulty or complication. The needle was removed intact. L3 Medial Branch Nerve Block (MBB): The target area for the L3 medial branch is at the junction of the postero-lateral aspect of the superior articular process and the superior, posterior, and medial edge of the transverse process of L4. Under fluoroscopic guidance, a Quincke needle was inserted until contact was made with os over the superior postero-lateral aspect of the  pedicular shadow (target area). After negative aspiration for blood, 0.5 mL of the nerve block solution was injected without difficulty or complication. The needle was removed intact. L4 Medial Branch Nerve Block (MBB): The target area for the L4 medial branch is at the junction of the postero-lateral aspect of the superior articular process and the superior, posterior, and medial edge of the transverse process of L5. Under fluoroscopic guidance, a Quincke needle was inserted until contact was made with os over the superior postero-lateral aspect of the pedicular shadow (target area). After negative aspiration for blood, 0.5 mL of the nerve block solution was injected without difficulty or complication. The needle was removed intact. L5 Medial Branch Nerve Block (MBB): The target area for the L5 medial branch is at the junction of the postero-lateral aspect of the superior articular process and the superior, posterior, and medial edge of the sacral ala. Under fluoroscopic guidance, a Quincke needle was inserted until contact was made with os over the superior postero-lateral aspect of the pedicular shadow (target area). After negative aspiration for blood, 0.5 mL of the nerve block solution was injected without difficulty or complication. The needle was removed intact. S1 Medial Branch Nerve Block (MBB): The target area for the S1 medial branch is at the posterior and inferior 6 o'clock position of the L5-S1 facet joint. Under fluoroscopic guidance, the Quincke needle inserted for the L5 MBB was redirected until contact was made with os over the inferior and postero aspect of the sacrum, at the 6 o' clock position under the L5-S1 facet joint (Target area). After negative aspiration for blood, 0.5 mL of the nerve block solution was injected without difficulty or complication. The needle was removed intact.  Nerve block solution: 0.2% PF-Ropivacaine + Triamcinolone (40 mg/mL) diluted to a final concentration of 4  mg of Triamcinolone/mL of Ropivacaine The unused portion of the solution was discarded in the proper designated containers. Procedural Needles: 22-gauge, 3.5-inch, Quincke needles used for all levels.  Once the entire procedure was completed, the treated  area was cleaned, making sure to leave some of the prepping solution back to take advantage of its long term bactericidal properties.   Illustration of the posterior view of the lumbar spine and the posterior neural structures. Laminae of L2 through S1 are labeled. DPRL5, dorsal primary ramus of L5; DPRS1, dorsal primary ramus of S1; DPR3, dorsal primary ramus of L3; FJ, facet (zygapophyseal) joint L3-L4; I, inferior articular process of L4; LB1, lateral branch of dorsal primary ramus of L1; IAB, inferior articular branches from L3 medial branch (supplies L4-L5 facet joint); IBP, intermediate branch plexus; MB3, medial branch of dorsal primary ramus of L3; NR3, third lumbar nerve root; S, superior articular process of L5; SAB, superior articular branches from L4 (supplies L4-5 facet joint also); TP3, transverse process of L3.  Vitals:   01/01/19 1340 01/01/19 1351 01/01/19 1400 01/01/19 1410  BP: 108/82 92/81 110/75 (!) 95/54  Pulse: 78     Resp: 12 11 14 16   Temp:  98.1 F (36.7 C)  97.7 F (36.5 C)  TempSrc:  Temporal    SpO2: 96% 100% 97% 100%  Weight:      Height:         Start Time: 1328 hrs. End Time: 1339 hrs.  Imaging Guidance (Spinal):          Type of Imaging Technique: Fluoroscopy Guidance (Spinal) Indication(s): Assistance in needle guidance and placement for procedures requiring needle placement in or near specific anatomical locations not easily accessible without such assistance. Exposure Time: Please see nurses notes. Contrast: None used. Fluoroscopic Guidance: I was personally present during the use of fluoroscopy. "Tunnel Vision Technique" used to obtain the best possible view of the target area. Parallax error  corrected before commencing the procedure. "Direction-depth-direction" technique used to introduce the needle under continuous pulsed fluoroscopy. Once target was reached, antero-posterior, oblique, and lateral fluoroscopic projection used confirm needle placement in all planes. Images permanently stored in EMR. Interpretation: No contrast injected. I personally interpreted the imaging intraoperatively. Adequate needle placement confirmed in multiple planes. Permanent images saved into the patient's record.  Antibiotic Prophylaxis:   Anti-infectives (From admission, onward)   None     Indication(s): None identified  Post-operative Assessment:  Post-procedure Vital Signs:  Pulse/HCG Rate: 7873 Temp: 97.7 F (36.5 C) Resp: 16 BP: (!) 95/54 SpO2: 100 %  EBL: None  Complications: No immediate post-treatment complications observed by team, or reported by patient.  Note: The patient tolerated the entire procedure well. A repeat set of vitals were taken after the procedure and the patient was kept under observation following institutional policy, for this type of procedure. Post-procedural neurological assessment was performed, showing return to baseline, prior to discharge. The patient was provided with post-procedure discharge instructions, including a section on how to identify potential problems. Should any problems arise concerning this procedure, the patient was given instructions to immediately contact us, at any time, without hesitation. In any case, we plan to contact the patient by telephone for a follow-up status report regarding this interventional procedure.  Comments:  No additional relevant information.  Plan of Care    Imaging Orders     DG C-Arm 1-60 Min-No Report  Procedure Orders     LUMBAR FACET(MEDIAL BRANCH NERVE BLOCK) MBNB  Medications ordered for procedure: Meds ordered this encounter  Medications  . lidocaine (XYLOCAINE) 2 % (with pres) injection 400 mg  .  midazolam (VERSED) 5 MG/5ML injection 1-2 mg    Make sure Flumazenil is available in the  pyxis when using this medication. If oversedation occurs, administer 0.2 mg IV over 15 sec. If after 45 sec no response, administer 0.2 mg again over 1 min; may repeat at 1 min intervals; not to exceed 4 doses (1 mg)  . fentaNYL (SUBLIMAZE) injection 25-50 mcg    Make sure Narcan is available in the pyxis when using this medication. In the event of respiratory depression (RR< 8/min): Titrate NARCAN (naloxone) in increments of 0.1 to 0.2 mg IV at 2-3 minute intervals, until desired degree of reversal.  . lactated ringers infusion 1,000 mL  . ropivacaine (PF) 2 mg/mL (0.2%) (NAROPIN) injection 18 mL  . triamcinolone acetonide (KENALOG-40) injection 80 mg   Medications administered: We administered lidocaine, midazolam, fentaNYL, lactated ringers, ropivacaine (PF) 2 mg/mL (0.2%), and triamcinolone acetonide.  See the medical record for exact dosing, route, and time of administration.  Disposition: Discharge home  Discharge Date & Time: 01/01/2019; 1417 hrs.   Physician-requested Follow-up: Return for PPE (2 wks), w/ Dr. Dossie Arbour.  Future Appointments  Date Time Provider Azure  01/19/2019 10:15 AM Milinda Pointer, MD [facility] None   Primary Care Physician: Marygrace Drought, MD Location: [facility] Note by: [name], MD Date: 01/01/2019; Time: 3:17 PM  Disclaimer:  Medicine is not an Chief Strategy Officer. The only guarantee in medicine is that nothing is guaranteed. It is important to note that the decision to proceed with this intervention was based on the information collected from the patient. The Data and conclusions were drawn from the patient's questionnaire, the interview, and the physical examination. Because the information was provided in large part by the patient, it cannot be guaranteed that it has not been purposely or unconsciously manipulated.  Every effort has been made to obtain as much relevant data as possible for this evaluation. It is important to note that the conclusions that lead to this procedure are derived in large part from the available data. Always take into account that the treatment will also be dependent on availability of resources and existing treatment guidelines, considered by other Pain Management Practitioners as being common knowledge and practice, at the time of the intervention. For Medico-Legal purposes, it is also important to point out that variation in procedural techniques and pharmacological choices are the acceptable norm. The indications, contraindications, technique, and results of the above procedure should only be interpreted and judged by a Board-Certified Interventional Pain Specialist with extensive familiarity and expertise in the same exact procedure and technique.

## 2019-01-01 NOTE — Progress Notes (Signed)
Safety precautions to be maintained throughout the outpatient stay will include: orient to surroundings, keep bed in low position, maintain call bell within reach at all times, provide assistance with transfer out of bed and ambulation.  

## 2019-01-01 NOTE — Patient Instructions (Signed)

## 2019-01-02 ENCOUNTER — Telehealth: Payer: Self-pay

## 2019-01-02 NOTE — Telephone Encounter (Signed)
Post procedure phone call. Patient states she is doing good.  

## 2019-01-18 NOTE — Progress Notes (Signed)
Patient's Name: Shannon Benitez  MRN: [id number]  Referring Provider: Marygrace Drought, MD  DOB: July 29, 1971  PCP: Marygrace Drought, MD  DOS: 01/19/2019  Note by: [name], MD  Service setting: Ambulatory outpatient  Specialty: Interventional Pain Management  Location: [facility] (AMB) Pain Management Facility    Patient type: Established   Primary Reason(s) for Visit: Encounter for post-procedure evaluation of chronic illness with mild to moderate exacerbation CC: Back Pain (low) and Leg Pain (bilateral and posterior)  HPI  Shannon Benitez is a 48 y.o. year old, female patient, who comes today for a post-procedure evaluation. She has Anemia; Anxiety; Situational depression; Diabetes mellitus type 2 in obese ([place]); Generalized seizure disorder ([place]); Medication management; Multiple sclerosis ([place]); Neurogenic pain; Sacroiliac pain; Tremor; Vitamin D deficiency; Chronic pain syndrome; Chronic low back pain (Primary area of Pain) (Bilateral) (L>R) w/ sciatica (Bilateral); Chronic lower extremity pain (Secondary Area of Pain) (Bilateral) (R>L); Long term prescription benzodiazepine use; Pharmacologic therapy; Disorder of skeletal system; Problems influencing health status; Chronic low back pain (Bilateral) (L>R) w/o sciatica; Muscle spasticity; Spondylosis without myelopathy or radiculopathy, lumbosacral region; Lumbar facet syndrome (Bilateral) (L>R); Abnormal MRI, lumbar spine (07/02/2017); Lumbar facet hypertrophy (Multilevel) (Bilateral); Osteoarthritis of facet joint of lumbar spine; Osteoarthritis of lumbar spine; DDD (degenerative disc disease), lumbosacral; Lumbar spondylosis; and Morbid obesity with BMI of 40.0-44.9, adult ([place]) on their problem list. Her primarily concern today is the Back Pain (low) and Leg Pain (bilateral and posterior)  Pain Assessment: Location: Lower Back Radiating: legs posterior Onset: More than a month ago Duration: Chronic pain Quality: Aching, Constant,  Throbbing Severity: 3 /10 (subjective, self-reported pain score)  Note: Reported level is compatible with observation.                         When using our objective Pain Scale, levels between 6 and 10/10 are said to belong in an emergency room, as it progressively worsens from a 6/10, described as severely limiting, requiring emergency care not usually available at an outpatient pain management facility. At a 6/10 level, communication becomes difficult and requires great effort. Assistance to reach the emergency department may be required. Facial flushing and profuse sweating along with potentially dangerous increases in heart rate and blood pressure will be evident. Timing: Constant Modifying factors: rest BP: 125/74  HR: 82  Shannon Benitez comes in today for post-procedure evaluation.  Based on today's evaluation, we have confirmed that the patient have facet arthropathy and a bilateral lumbar facet syndrome.  However, there are factors that seem to continue irritating the area and therefore the pain has returned.  We will go ahead and plan to repeat the procedure and see we get similar results.  She is having a lot of problems accepting that these are conditions that are chronic.  Today she again asked me if I could put her on some type of medication for the pain.  Even though we are writing for gabapentin and baclofen, she voices this in a manner that it would seem [place] we were not doing anything for her back pain.  When I told her that we needed to have her come back for her second diagnostic procedure, she said: "So you mean to say that I have to come back to get stuck over 20 times again".  I have extensively talked to the patient about this and today I went over it again.  I informed her that the first face is to diagnose where  her pain is coming from.  The second 1 would be to confirm that the results on the first diagnostic injection were correct and if so then see and compare the results  of the first to the second 1 to see if she is benefiting from subsequent steroid injection.  If she is not, then we will plan on doing a radiofrequency ablation.  Today I also talked with the patient about the origin of this type of pain and how it is associated with osteoarthritis and her BMI above 30.  The patient attempted to tell me about her prior exercise habits and I have pointed out that she now has chronic pain and therefore she needs to think more in terms of dieting as opposed to losing the weight by way of exercising.  Today I will start her on an NSAID (Mobic 15 mg p.o. daily) since her renal labs were within normal limits.  Today she spent quite a bit of time telling me how she is having some difficulty dealing with this secondary to the death of her son, and apparently other members of her immediate family.  She is also having difficulty dealing with the fact that now she has MS and that she has recently been diagnosed with bilateral lumbar facet syndrome.  It is for this reason that today I have placed on order for referral to medical psychology to help her deal with some of these problems, perhaps with some grief counseling and also for counseling associated to chronic pain.  In addition to this I have placed a referral for the patient to go to a medically supervised weight management program and also a referral for a bariatric surgery evaluation.  Hopefully she will keep these and begin to work on part of the cause of her pain.  Further details on both, my assessment(s), as well as the proposed treatment plan, please see below.  Post-Procedure Assessment  01/01/2019 Procedure: Diagnostic bilateral lumbar facet block #1 under fluoroscopic guidance and IV sedation Pre-procedure pain score:  5/10 Post-procedure pain score: 0/10 (100% relief) Influential Factors: BMI: 40.62 kg/m Intra-procedural challenges: None observed.         Assessment challenges: None detected.              Reported  side-effects: None.        Post-procedural adverse reactions or complications: None reported         Sedation: Sedation provided. When no sedatives are used, the analgesic levels obtained are directly associated to the effectiveness of the local anesthetics. However, when sedation is provided, the level of analgesia obtained during the initial 1 hour following the intervention, is believed to be the result of a combination of factors. These factors may include, but are not limited to: 1. The effectiveness of the local anesthetics used. 2. The effects of the analgesic(s) and/or anxiolytic(s) used. 3. The degree of discomfort experienced by the patient at the time of the procedure. 4. The patients ability and reliability in recalling and recording the events. 5. The presence and influence of possible secondary gains and/or psychosocial factors. Reported result: Relief experienced during the 1st hour after the procedure: 100 % (Ultra-Short Term Relief)            Interpretative annotation: Clinically appropriate result. Analgesia during this period is likely to be Local Anesthetic and/or IV Sedative (Analgesic/Anxiolytic) related.          Effects of local anesthetic: The analgesic effects attained during this period are directly  associated to the localized infiltration of local anesthetics and therefore cary significant diagnostic value as to the etiological location, or anatomical origin, of the pain. Expected duration of relief is directly dependent on the pharmacodynamics of the local anesthetic used. Long-acting (4-6 hours) anesthetics used.  Reported result: Relief during the next 4 to 6 hour after the procedure: 100 % (Short-Term Relief)            Interpretative annotation: Clinically appropriate result. Analgesia during this period is likely to be Local Anesthetic-related.          Long-term benefit: Defined as the period of time past the expected duration of local anesthetics (1 hour for  short-acting and 4-6 hours for long-acting). With the possible exception of prolonged sympathetic blockade from the local anesthetics, benefits during this period are typically attributed to, or associated with, other factors such as analgesic sensory neuropraxia, antiinflammatory effects, or beneficial biochemical changes provided by agents other than the local anesthetics.  Reported result: Extended relief following procedure: 75 %(patient states return of pre procedure pain after the third day) (Long-Term Relief)            Interpretative annotation: Clinically possible results. Good relief. No permanent benefit expected. Inflammation plays a part in the etiology to the pain.          Current benefits: Defined as reported results that persistent at this point in time.   Analgesia: <50 %            Function: Somewhat improved ROM: Somewhat improved Interpretative annotation: Recurrence of symptoms. No permanent benefit expected. Results would suggest persistent aggravating factors.          Interpretation: Results would suggest a successful diagnostic intervention.                  Plan:  Proceed with diagnostic procedure No.: 2          Laboratory Chemistry  Inflammation Markers (CRP: Acute Phase) (ESR: Chronic Phase) Lab Results  Component Value Date   CRP 10 11/27/2018   ESRSEDRATE 42 (H) 11/27/2018                         Renal Markers Lab Results  Component Value Date   BUN 10 11/27/2018   CREATININE 0.79 11/27/2018   BCR 13 11/27/2018   GFRAA 102 11/27/2018   GFRNONAA 89 11/27/2018                             Hepatic Markers Lab Results  Component Value Date   AST 17 11/27/2018   ALT 35 08/18/2018   ALBUMIN 4.1 11/27/2018                        Note: Lab results reviewed.  Recent Imaging Results   Results for orders placed in visit on 01/01/19  DG C-Arm 1-60 Min-No Report   Narrative Fluoroscopy was utilized by the requesting physician.  No radiographic   interpretation.    Interpretation Report: Fluoroscopy was used during the procedure to assist with needle guidance. The images were interpreted intraoperatively by the requesting physician.        Meds   Current Outpatient Medications:  .  baclofen (LIORESAL) 10 MG tablet, Take 2 tablets (20 mg total) by mouth 4 (four) times daily., Disp: 240 tablet, Rfl: 2 .  clonazePAM (KLONOPIN) 1 MG tablet, Take  1 mg by mouth 2 (two) times daily as needed for anxiety (sleep). , Disp: , Rfl:  .  Dimethyl Fumarate 240 MG CPDR, Take 240 mg by mouth 2 (two) times daily., Disp: , Rfl:  .  DULoxetine (CYMBALTA) 30 MG capsule, Take 30 mg by mouth at bedtime., Disp: , Rfl:  .  gabapentin (NEURONTIN) 800 MG tablet, Take 1 tablet (800 mg total) by mouth every 8 (eight) hours., Disp: 90 tablet, Rfl: 2 .  lamoTRIgine (LAMICTAL) 25 MG tablet, Take 50 mg by mouth 2 (two) times daily., Disp: , Rfl:  .  metFORMIN (GLUCOPHAGE-XR) 500 MG 24 hr tablet, Take 500 mg by mouth daily., Disp: , Rfl:  .  omeprazole (PRILOSEC) 20 MG capsule, Take 20 mg by mouth daily., Disp: , Rfl:  .  primidone (MYSOLINE) 50 MG tablet, Take 50 mg by mouth 4 (four) times daily., Disp: , Rfl:  .  propranolol (INDERAL) 40 MG tablet, Take 40 mg by mouth 3 (three) times daily., Disp: , Rfl:  .  sitaGLIPtin (JANUVIA) 100 MG tablet, Take 100 mg by mouth daily., Disp: , Rfl:  .  solifenacin (VESICARE) 10 MG tablet, Take by mouth daily., Disp: , Rfl:  .  gabapentin (NEURONTIN) 400 MG capsule, Take 800 mg by mouth 3 (three) times daily. , Disp: , Rfl:  .  meloxicam (MOBIC) 15 MG tablet, Take 1 tablet (15 mg total) by mouth daily for 30 days., Disp: 30 tablet, Rfl: 0  ROS  Constitutional: Denies any fever or chills Gastrointestinal: No reported hemesis, hematochezia, vomiting, or acute GI distress Musculoskeletal: Denies any acute onset joint swelling, redness, loss of ROM, or weakness Neurological: No reported episodes of acute onset apraxia, aphasia,  dysarthria, agnosia, amnesia, paralysis, loss of coordination, or loss of consciousness  Allergies  Ms. Schaff is allergic to glatiramer; latex; and rebif [interferon beta-1a].  Renova  Drug: Ms. Whelpley  has no history on file for drug. Alcohol:  reports no history of alcohol use. Tobacco:  reports that she quit smoking about 2 years ago. Her smoking use included cigarettes. She has quit using smokeless tobacco. Medical:  has a past medical history of Diabetes mellitus without complication ([place]) and MS (multiple sclerosis) ([place]). Surgical: Ms. Skalla  has a past surgical history that includes Abdominal hysterectomy; Cesarean section; and Tumor removal (Left). Family: family history is not on file. She was adopted.  Constitutional Exam  General appearance: Well nourished, well developed, and well hydrated. In no apparent acute distress Vitals:   01/19/19 1013  BP: 125/74  Pulse: 82  Resp: 18  Temp: 98.7 F (37.1 C)  TempSrc: Oral  SpO2: 98%  Weight: 215 lb (97.5 kg)  Height: 5' 1"  (1.549 m)   BMI Assessment: Estimated body mass index is 40.62 kg/m as calculated from the following:   Height as of this encounter: 5' 1"  (1.549 m).   Weight as of this encounter: 215 lb (97.5 kg).  BMI interpretation table: BMI level Category Range association with higher incidence of chronic pain  <18 kg/m2 Underweight   18.5-24.9 kg/m2 Ideal body weight   25-29.9 kg/m2 Overweight Increased incidence by 20%  30-34.9 kg/m2 Obese (Class I) Increased incidence by 68%  35-39.9 kg/m2 Severe obesity (Class II) Increased incidence by 136%  >40 kg/m2 Extreme obesity (Class III) Increased incidence by 254%   Patient's current BMI Ideal Body weight  Body mass index is 40.62 kg/m. Ideal body weight: 47.8 kg (105 lb 6.1 oz) Adjusted ideal body weight:  67.7 kg (149 lb 3.6 oz)   BMI Readings from Last 4 Encounters:  01/19/19 40.62 kg/m  01/01/19 40.62 kg/m  12/17/18 40.62 kg/m  11/27/18  41.00 kg/m   Wt Readings from Last 4 Encounters:  01/19/19 215 lb (97.5 kg)  01/01/19 215 lb (97.5 kg)  12/17/18 215 lb (97.5 kg)  11/27/18 217 lb (98.4 kg)  Psych/Mental status: Alert, oriented x 3 (person, place, & time)       Eyes: PERLA Respiratory: No evidence of acute respiratory distress  Cervical Spine Area Exam  Skin & Axial Inspection: No masses, redness, edema, swelling, or associated skin lesions Alignment: Symmetrical Functional ROM: Unrestricted ROM      Stability: No instability detected Muscle Tone/Strength: Functionally intact. No obvious neuro-muscular anomalies detected. Sensory (Neurological): Unimpaired Palpation: No palpable anomalies              Upper Extremity (UE) Exam    Side: Right upper extremity  Side: Left upper extremity  Skin & Extremity Inspection: Skin color, temperature, and hair growth are WNL. No peripheral edema or cyanosis. No masses, redness, swelling, asymmetry, or associated skin lesions. No contractures.  Skin & Extremity Inspection: Skin color, temperature, and hair growth are WNL. No peripheral edema or cyanosis. No masses, redness, swelling, asymmetry, or associated skin lesions. No contractures.  Functional ROM: Unrestricted ROM          Functional ROM: Unrestricted ROM          Muscle Tone/Strength: Functionally intact. No obvious neuro-muscular anomalies detected.  Muscle Tone/Strength: Functionally intact. No obvious neuro-muscular anomalies detected.  Sensory (Neurological): Unimpaired          Sensory (Neurological): Unimpaired          Palpation: No palpable anomalies              Palpation: No palpable anomalies              Provocative Test(s):  Phalen's test: deferred Tinel's test: deferred Apley's scratch test (touch opposite shoulder):  Action 1 (Across chest): deferred Action 2 (Overhead): deferred Action 3 (LB reach): deferred   Provocative Test(s):  Phalen's test: deferred Tinel's test: deferred Apley's scratch test  (touch opposite shoulder):  Action 1 (Across chest): deferred Action 2 (Overhead): deferred Action 3 (LB reach): deferred    Thoracic Spine Area Exam  Skin & Axial Inspection: No masses, redness, or swelling Alignment: Symmetrical Functional ROM: Unrestricted ROM Stability: No instability detected Muscle Tone/Strength: Functionally intact. No obvious neuro-muscular anomalies detected. Sensory (Neurological): Unimpaired Muscle strength & Tone: No palpable anomalies  Lumbar Spine Area Exam  Skin & Axial Inspection: No masses, redness, or swelling Alignment: Symmetrical Functional ROM: Decreased ROM affecting both sides Stability: No instability detected Muscle Tone/Strength: Functionally intact. No obvious neuro-muscular anomalies detected. Sensory (Neurological): Unimpaired Palpation: No palpable anomalies       Provocative Tests: Hyperextension/rotation test: (+) bilaterally for facet joint pain. Lumbar quadrant test (Kemp's test): (+) bilaterally for facet joint pain. Lateral bending test: deferred today       Patrick's Maneuver: deferred today                   FABER* test: deferred today                   S-I anterior distraction/compression test: deferred today         S-I lateral compression test: deferred today         S-I Thigh-thrust test: deferred today  S-I Gaenslen's test: deferred today         *(Flexion, ABduction and External Rotation)  Gait & Posture Assessment  Ambulation: Unassisted Gait: Relatively normal for age and body habitus Posture: Difficulty standing up straight, due to pain   Lower Extremity Exam    Side: Right lower extremity  Side: Left lower extremity  Stability: No instability observed          Stability: No instability observed          Skin & Extremity Inspection: Skin color, temperature, and hair growth are WNL. No peripheral edema or cyanosis. No masses, redness, swelling, asymmetry, or associated skin lesions. No contractures.   Skin & Extremity Inspection: Skin color, temperature, and hair growth are WNL. No peripheral edema or cyanosis. No masses, redness, swelling, asymmetry, or associated skin lesions. No contractures.  Functional ROM: Unrestricted ROM                  Functional ROM: Unrestricted ROM                  Muscle Tone/Strength: Functionally intact. No obvious neuro-muscular anomalies detected.  Muscle Tone/Strength: Functionally intact. No obvious neuro-muscular anomalies detected.  Sensory (Neurological): Unimpaired        Sensory (Neurological): Unimpaired        DTR: Patellar: deferred today Achilles: deferred today Plantar: deferred today  DTR: Patellar: deferred today Achilles: deferred today Plantar: deferred today  Palpation: No palpable anomalies  Palpation: No palpable anomalies   Assessment   Status Diagnosis  Recurring Controlled Persistent 1. Chronic low back pain (Bilateral) (L>R) w/o sciatica   2. Chronic lower extremity pain (Secondary Area of Pain) (Bilateral) (R>L)   3. Lumbar facet syndrome (Bilateral) (L>R)   4. Multiple sclerosis ([place])   5. Osteoarthritis of facet joint of lumbar spine   6. Osteoarthritis of lumbar spine   7. Situational depression   8. Morbid obesity with BMI of 40.0-44.9, adult ([place])   9. Diabetes mellitus type 2 in obese [facility])      Updated Problems: Problem  Morbid Obesity With Bmi of 40.0-44.9, Adult ([facility])  Situational Depression    Plan of Care  Pharmacotherapy (Medications Ordered): Meds ordered this encounter  Medications  . meloxicam (MOBIC) 15 MG tablet    Sig: Take 1 tablet (15 mg total) by mouth daily for 30 days.    Dispense:  30 tablet    Refill:  0    Do not add this medication to the electronic "Automatic Refill" notification system. Patient may have prescription filled one day early if pharmacy is closed on scheduled refill date.   Medications administered today: Seriyah Castelli had no medications administered during this  visit.  Orders:  Orders Placed This Encounter  Procedures  . LUMBAR FACET(MEDIAL BRANCH NERVE BLOCK) MBNB    Standing Status:   Future    Standing Expiration Date:   02/19/2019    Scheduling Instructions:     Side: Bilateral     Level: L3-4, L4-5, & L5-S1 Facets (L2, L3, L4, L5, & S1 Medial Branch Nerves)     Sedation: With Sedation.     Timeframe: ASAA    Order Specific Question:   Where will this procedure be performed?    Answer:   [facility]  . Ambulatory referral to Psychology    Referral Priority:   Routine    Referral Type:   Psychiatric    Referral Reason:   Specialty Services Required  Requested Specialty:   Psychology    Number of Visits Requested:   1  . Amb Ref to Medical Weight Management    Referral Priority:   Routine    Referral Type:   Consultation    Referral Reason:   Specialty Services Required    Number of Visits Requested:   1  . Amb Referral to Bariatric Surgery    Referral Priority:   Routine    Referral Type:   Consultation    Referral Reason:   Specialty Services Required    Number of Visits Requested:   1   Lab Orders  No laboratory test(s) ordered today   Imaging Orders  No imaging studies ordered today    Referral Orders     Ambulatory referral to Psychology     Amb Ref to Medical Weight Management     Amb Referral to Bariatric Surgery Planned follow-up:   Return for Procedure (w/ sedation): (B) L-FCT BLK #2. Diagnostic bilateral lumbar facet block #2 under fluoroscopic guidance and IV sedation NOTE: (NO RFA until BMI < 35)   Interventional management options: Considering:   Diagnostic bilateral lumbar facet block  Possible bilateral lumbar facet RFA (NO RFA until BMI < 35)   Palliative PRN treatment(s):   Palliative bilateral lumbar facet blocks    Future Appointments  Date Time Provider [place]  02/10/2019 12:45 PM Milinda Pointer, MD [facility] None   Primary Care Physician: Marygrace Drought, MD Location:  [facility] Note by: [name], MD Date: 01/19/2019; Time: 1:06 PM

## 2019-01-19 ENCOUNTER — Encounter: Payer: Self-pay | Admitting: Pain Medicine

## 2019-01-19 ENCOUNTER — Ambulatory Visit: Payer: Medicaid Other | Attending: Pain Medicine | Admitting: Pain Medicine

## 2019-01-19 ENCOUNTER — Other Ambulatory Visit: Payer: Self-pay

## 2019-01-19 VITALS — BP 125/74 | HR 82 | Temp 98.7°F | Resp 18 | Ht 61.0 in | Wt 215.0 lb

## 2019-01-19 DIAGNOSIS — G8929 Other chronic pain: Secondary | ICD-10-CM | POA: Diagnosis present

## 2019-01-19 DIAGNOSIS — E1169 Type 2 diabetes mellitus with other specified complication: Secondary | ICD-10-CM | POA: Insufficient documentation

## 2019-01-19 DIAGNOSIS — M79605 Pain in left leg: Secondary | ICD-10-CM | POA: Diagnosis present

## 2019-01-19 DIAGNOSIS — E66813 Obesity, class 3: Secondary | ICD-10-CM | POA: Insufficient documentation

## 2019-01-19 DIAGNOSIS — Z6841 Body Mass Index (BMI) 40.0 and over, adult: Secondary | ICD-10-CM | POA: Diagnosis present

## 2019-01-19 DIAGNOSIS — E669 Obesity, unspecified: Secondary | ICD-10-CM | POA: Diagnosis present

## 2019-01-19 DIAGNOSIS — M545 Low back pain: Secondary | ICD-10-CM | POA: Diagnosis not present

## 2019-01-19 DIAGNOSIS — F4321 Adjustment disorder with depressed mood: Secondary | ICD-10-CM

## 2019-01-19 DIAGNOSIS — M47816 Spondylosis without myelopathy or radiculopathy, lumbar region: Secondary | ICD-10-CM | POA: Diagnosis present

## 2019-01-19 DIAGNOSIS — G35D Multiple sclerosis, unspecified: Secondary | ICD-10-CM

## 2019-01-19 DIAGNOSIS — M79604 Pain in right leg: Secondary | ICD-10-CM | POA: Diagnosis not present

## 2019-01-19 DIAGNOSIS — G35 Multiple sclerosis: Secondary | ICD-10-CM | POA: Diagnosis present

## 2019-01-19 MED ORDER — MELOXICAM 15 MG PO TABS: 15.0000 mg | ORAL_TABLET | Freq: Every day | ORAL | 0 refills | Status: DC

## 2019-01-19 NOTE — Patient Instructions (Addendum)
____________________________________________________________________________________________  Preparing for Procedure with Sedation  Procedure appointments are limited to planned procedures: . No Prescription Refills. . No disability issues will be discussed. . No medication changes will be discussed.  Instructions: . Oral Intake: Do not eat or drink anything for at least 8 hours prior to your procedure. . Transportation: Public transportation is not allowed. Bring an adult driver. The driver must be physically present in our waiting room before any procedure can be started. . Physical Assistance: Bring an adult physically capable of assisting you, in the event you need help. This adult should keep you company at home for at least 6 hours after the procedure. . Blood Pressure Medicine: Take your blood pressure medicine with a sip of water the morning of the procedure. . Blood thinners: Notify our staff if you are taking any blood thinners. Depending on which one you take, there will be specific instructions on how and when to stop it. . Diabetics on insulin: Notify the staff so that you can be scheduled 1st case in the morning. If your diabetes requires high dose insulin, take only  of your normal insulin dose the morning of the procedure and notify the staff that you have done so. . Preventing infections: Shower with an antibacterial soap the morning of your procedure. . Build-up your immune system: Take 1000 mg of Vitamin C with every meal (3 times a day) the day prior to your procedure. . Antibiotics: Inform the staff if you have a condition or reason that requires you to take antibiotics before dental procedures. . Pregnancy: If you are pregnant, call and cancel the procedure. . Sickness: If you have a cold, fever, or any active infections, call and cancel the procedure. . Arrival: You must be in the facility at least 30 minutes prior to your scheduled procedure. . Children: Do not bring  children with you. . Dress appropriately: Bring dark clothing that you would not mind if they get stained. . Valuables: Do not bring any jewelry or valuables.  Reasons to call and reschedule or cancel your procedure: (Following these recommendations will minimize the risk of a serious complication.) . Surgeries: Avoid having procedures within 2 weeks of any surgery. (Avoid for 2 weeks before or after any surgery). . Flu Shots: Avoid having procedures within 2 weeks of a flu shots or . (Avoid for 2 weeks before or after immunizations). . Barium: Avoid having a procedure within 7-10 days after having had a radiological study involving the use of radiological contrast. (Myelograms, Barium swallow or enema study). . Heart attacks: Avoid any elective procedures or surgeries for the initial 6 months after a "Myocardial Infarction" (Heart Attack). . Blood thinners: It is imperative that you stop these medications before procedures. Let us know if you if you take any blood thinner.  . Infection: Avoid procedures during or within two weeks of an infection (including chest colds or gastrointestinal problems). Symptoms associated with infections include: Localized redness, fever, chills, night sweats or profuse sweating, burning sensation when voiding, cough, congestion, stuffiness, runny nose, sore throat, diarrhea, nausea, vomiting, cold or Flu symptoms, recent or current infections. It is specially important if the infection is over the area that we intend to treat. . Heart and lung problems: Symptoms that may suggest an active cardiopulmonary problem include: cough, chest pain, breathing difficulties or shortness of breath, dizziness, ankle swelling, uncontrolled high or unusually low blood pressure, and/or palpitations. If you are experiencing any of these symptoms, cancel your procedure and contact   your primary care physician for an evaluation.  Remember:  Regular Business hours are:  Monday to Thursday  8:00 AM to 4:00 PM  Provider's Schedule: Milinda Pointer, MD:  Procedure days: Tuesday and Thursday 7:30 AM to 4:00 PM  Gillis Santa, MD:  Procedure days: Monday and Wednesday 7:30 AM to 4:00 PM ____________________________________________________________________________________________    ______________________________________________________________________________________________  Weight Management Required  URGENT: Your weight has been found to be adversely affecting your health.  Dear Ms. Melnik:  Your current Body mass index is 40.62 kg/m.Marland Kitchen Estimated body mass index is 40.62 kg/m as calculated from the following:   Height as of this encounter: 5\' 1"  (1.549 m).   Weight as of this encounter: 215 lb (97.5 kg).  Your last four (4) weight and BMI calculations are as follows: Wt Readings from Last 4 Encounters:  01/19/19 215 lb (97.5 kg)  01/01/19 215 lb (97.5 kg)  12/17/18 215 lb (97.5 kg)  11/27/18 217 lb (98.4 kg)   BMI Readings from Last 4 Encounters:  01/19/19 40.62 kg/m  01/01/19 40.62 kg/m  12/17/18 40.62 kg/m  11/27/18 41.00 kg/m    Calculations estimate your ideal body weight to be: Ideal body weight: 47.8 kg (105 lb 6.1 oz) Adjusted ideal body weight: 67.7 kg (149 lb 3.6 oz)  Please use the table below to identify your weight category and associated incidence of chronic pain, secondary to your weight.  BMI interpretation table: BMI level Category Associated incidence of chronic pain  <18 kg/m2 Underweight   18.5-24.9 kg/m2 Ideal body weight   25-29.9 kg/m2 Overweight  20%  30-34.9 kg/m2 Obese (Class I)  68%  35-39.9 kg/m2 Severe obesity (Class II)  136%  >40 kg/m2 Extreme obesity (Class III)  254%   In addition: You will be considered "Morbidly Obese", if your BMI is above 30 and you have one or more of the following conditions which are known to be directly associated with obesity: 1.    Type 2 Diabetes (Which in turn can lead to  cardiovascular diseases (CVD), stroke, peripheral vascular diseases (PVD), retinopathy, nephropathy, and neuropathy) 2.    Cardiovascular Disease (High Blood Pressure; Congestive Heart Failure; High Cholesterol; Coronary Artery Disease; Angina; or History of Heart Attacks) 3.    Breathing problems (Asthma; obesity-hypoventilation syndrome; obstructive sleep apnea; chronic inflammatory airway disease; reactive airway disease; or shortness of breath) 4.    Chronic kidney disease 5.    Liver disease (nonalcoholic fatty liver disease) 6.    High blood pressure 7.    Acid reflux (gastroesophageal reflux disease; heartburn) 8.    Osteoarthritis (OA) (with any of the following: hip pain; knee pain; and/or low back pain) 9.    Low back pain (Lumbar Facet Syndrome; and/or Degenerative Disc Disease) 10.  Hip pain (Osteoarthritis of hip) (For every 1 lbs of added body weight, there is a 2 lbs increase in pressure inside of each hip articulation. 1:2 mechanical relationship) 11.  Knee pain (Osteoarthritis of knee) (For every 1 lbs of added body weight, there is a 4 lbs increase in pressure inside of each knee articulation. 1:4 mechanical relationship) (patients with a BMI>30 kg/m2 were 6.8 times more likely to develop knee OA than normal-weight individuals) 12.  Cancer. Epidemiological studies have shown that obesity is a risk factor for: post-menopausal breast cancer; cancers of the endometrium, colon and kidney cancer; malignant adenomas of the oesophagus. Obese subjects have an approximately 1.5-3.5-fold increased risk of developing these cancers compared with normal-weight subjects, and it has  been estimated that between 15 and 45% of these cancers can be attributed to overweight. More recent studies suggest that obesity may also increase the risk of other types of cancer, including pancreatic, hepatic and gallbladder cancer. (Ref: Obesity and cancer. Pischon T, Nthlings U, [employer] H. Proc Nutr Soc. 2008  May;67(2):128-45. doi: 96.2229/N9892119417408144.)  Recommendation: At this point it is urgent that you take a step back and concentrate in loosing weight. Dedicate 100% of your efforts on this task. Nothing else will improve your health more than bringing down your BMI to less than 30. Because most chronic pain patients do have difficulty exercising secondary to their pain, you must rely on proper nutrition and dieting in order to lose the weight. If your BMI is above 40, you should seriously consider bariatric surgery. A realistic goal is to lose 10% of your body weight over a period of 12 months.  If over time you have unsuccessfully try to lose weight, then it is time for you to seek professional help and to enter a medically supervised weight management program.  Pain management considerations:  1.    Pharmacological Problems: Be advised that the use of opioid analgesics (oxycodone; hydrocodone; morphine; methadone; codeine; and all of their derivatives) have been associated with decreased metabolism and weight gain.  For this reason, should we see that you are unable to lose weight while taking these medications, it may become necessary for [place] to taper down and indefinitely discontinue them.  2.    Technical Problems: The incidence of successful interventional therapies decreases as the patient's BMI increases. It is much more difficult to accomplish a safe and effective interventional therapy on a patient with a BMI above 35. Yours is Body mass index is 40.62 kg/m.Marland Kitchen  3.    Radiation Exposure Problems: The x-rays machine, used to accomplish injection therapies, will automatically increase their x-ray output in order to capture an appropriate bone image. This means that radiation exposure increases exponentially with the patient's BMI. (The higher the BMI, the higher the radiation exposure.) Although the level of radiation used at a given time is still safe to the patient, it is not for the physician  and/or assisting staff. Unfortunately, radiation exposure is accumulative. Because physicians and the staff have to do procedures and be exposed on a daily basis, this can result in health problems such as cancer and radiation burns. Radiation exposure to the staff is monitored by the radiation batches that they wear. The exposure levels are reported back to the staff on a quarterly basis. Depending on levels of exposure, physicians and staff may be obligated by law to decrease this exposure. This means that they have the right and obligation to refuse providing therapies where they may be overexposed to radiation. For this reason, physicians may decline to offer therapies such as radiofrequency ablation or implants to patients with a BMI above 40. 4.    Current Trends: Be advised that the current trend is to no longer offer certain therapies to patients with a BMI equal to, or above 35, due to increase perioperative risks, increased technical procedural difficulties, and excessive radiation exposure to healthcare personnel.  ______________________________________________________________________________________________   GENERAL RISKS AND COMPLICATIONS  What are the risk, side effects and possible complications? Generally speaking, most procedures are safe.  However, with any procedure there are risks, side effects, and the possibility of complications.  The risks and complications are dependent upon the sites that are lesioned, or the type of nerve block to  be performed.  The closer the procedure is to the spine, the more serious the risks are.  Great care is taken when placing the radio frequency needles, block needles or lesioning probes, but sometimes complications can occur. 1. Infection: Any time there is an injection through the skin, there is a risk of infection.  This is why sterile conditions are used for these blocks.  There are four possible types of infection. 1. Localized skin  infection. 2. Central Nervous System Infection-This can be in the form of Meningitis, which can be deadly. 3. Epidural Infections-This can be in the form of an epidural abscess, which can cause pressure inside of the spine, causing compression of the spinal cord with subsequent paralysis. This would require an emergency surgery to decompress, and there are no guarantees that the patient would recover from the paralysis. 4. Discitis-This is an infection of the intervertebral discs.  It occurs in about 1% of discography procedures.  It is difficult to treat and it may lead to surgery.        2. Pain: the needles have to go through skin and soft tissues, will cause soreness.       3. Damage to internal structures:  The nerves to be lesioned may be near blood vessels or    other nerves which can be potentially damaged.       4. Bleeding: Bleeding is more common if the patient is taking blood thinners such as  aspirin, Coumadin, Ticiid, Plavix, etc., or if he/she have some genetic predisposition  such as hemophilia. Bleeding into the spinal canal can cause compression of the spinal  cord with subsequent paralysis.  This would require an emergency surgery to  decompress and there are no guarantees that the patient would recover from the  paralysis.       5. Pneumothorax:  Puncturing of a lung is a possibility, every time a needle is introduced in  the area of the chest or upper back.  Pneumothorax refers to free air around the  collapsed lung(s), inside of the thoracic cavity (chest cavity).  Another two possible  complications related to a similar event would include: Hemothorax and Chylothorax.   These are variations of the Pneumothorax, where instead of air around the collapsed  lung(s), you may have blood or chyle, respectively.       6. Spinal headaches: They may occur with any procedures in the area of the spine.       7. Persistent CSF (Cerebro-Spinal Fluid) leakage: This is a rare problem, but may occur   with prolonged intrathecal or epidural catheters either due to the formation of a fistulous  track or a dural tear.       8. Nerve damage: By working so close to the spinal cord, there is always a possibility of  nerve damage, which could be as serious as a permanent spinal cord injury with  paralysis.       9. Death:  Although rare, severe deadly allergic reactions known as "Anaphylactic  reaction" can occur to any of the medications used.      10. Worsening of the symptoms:  We can always make thing worse.  What are the chances of something like this happening? Chances of any of this occuring are extremely low.  By statistics, you have more of a chance of getting killed in a motor vehicle accident: while driving to the hospital than any of the above occurring .  Nevertheless, you should be aware  that they are possibilities.  In general, it is similar to taking a shower.  Everybody knows that you can slip, hit your head and get killed.  Does that mean that you should not shower again?  Nevertheless always keep in mind that statistics do not mean anything if you happen to be on the wrong side of them.  Even if a procedure has a 1 (one) in a 1,000,000 (million) chance of going wrong, it you happen to be that one..Also, keep in mind that by statistics, you have more of a chance of having something go wrong when taking medications.  Who should not have this procedure? If you are on a blood thinning medication (e.g. Coumadin, Plavix, see list of "Blood Thinners"), or if you have an active infection going on, you should not have the procedure.  If you are taking any blood thinners, please inform your physician.  How should I prepare for this procedure?  Do not eat or drink anything at least six hours prior to the procedure.  Bring a driver with you .  It cannot be a taxi.  Come accompanied by an adult that can drive you back, and that is strong enough to help you if your legs get weak or numb from the  local anesthetic.  Take all of your medicines the morning of the procedure with just enough water to swallow them.  If you have diabetes, make sure that you are scheduled to have your procedure done first thing in the morning, whenever possible.  If you have diabetes, take only half of your insulin dose and notify our nurse that you have done so as soon as you arrive at the clinic.  If you are diabetic, but only take blood sugar pills (oral hypoglycemic), then do not take them on the morning of your procedure.  You may take them after you have had the procedure.  Do not take aspirin or any aspirin-containing medications, at least eleven (11) days prior to the procedure.  They may prolong bleeding.  Wear loose fitting clothing that may be easy to take off and that you would not mind if it got stained with Betadine or blood.  Do not wear any jewelry or perfume  Remove any nail coloring.  It will interfere with some of our monitoring equipment.  NOTE: Remember that this is not meant to be interpreted as a complete list of all possible complications.  Unforeseen problems may occur.  BLOOD THINNERS The following drugs contain aspirin or other products, which can cause increased bleeding during surgery and should not be taken for 2 weeks prior to and 1 week after surgery.  If you should need take something for relief of minor pain, you may take acetaminophen which is found in Tylenol,m Datril, Anacin-3 and Panadol. It is not blood thinner. The products listed below are.  Do not take any of the products listed below in addition to any listed on your instruction sheet.  A.P.C or A.P.C with Codeine Codeine Phosphate Capsules #3 Ibuprofen Ridaura  ABC compound Congesprin Imuran rimadil  Advil Cope Indocin Robaxisal  Alka-Seltzer Effervescent Pain Reliever and Antacid Coricidin or Coricidin-D  Indomethacin Rufen  Alka-Seltzer plus Cold Medicine Cosprin Ketoprofen S-A-C Tablets  Anacin Analgesic  Tablets or Capsules Coumadin Korlgesic Salflex  Anacin Extra Strength Analgesic tablets or capsules CP-2 Tablets Lanoril Salicylate  Anaprox Cuprimine Capsules Levenox Salocol  Anexsia-D Dalteparin Magan Salsalate  Anodynos Darvon compound Magnesium Salicylate Sine-off  Ansaid Dasin Capsules Magsal Sodium  Salicylate  Anturane Depen Capsules Marnal Soma  APF Arthritis pain formula Dewitt's Pills Measurin Stanback  Argesic Dia-Gesic Meclofenamic Sulfinpyrazone  Arthritis Bayer Timed Release Aspirin Diclofenac Meclomen Sulindac  Arthritis pain formula Anacin Dicumarol Medipren Supac  Analgesic (Safety coated) Arthralgen Diffunasal Mefanamic Suprofen  Arthritis Strength Bufferin Dihydrocodeine Mepro Compound Suprol  Arthropan liquid Dopirydamole Methcarbomol with Aspirin Synalgos  ASA tablets/Enseals Disalcid Micrainin Tagament  Ascriptin Doan's Midol Talwin  Ascriptin A/D Dolene Mobidin Tanderil  Ascriptin Extra Strength Dolobid Moblgesic Ticlid  Ascriptin with Codeine Doloprin or Doloprin with Codeine Momentum Tolectin  Asperbuf Duoprin Mono-gesic Trendar  Aspergum Duradyne Motrin or Motrin IB Triminicin  Aspirin plain, buffered or enteric coated Durasal Myochrisine Trigesic  Aspirin Suppositories Easprin Nalfon Trillsate  Aspirin with Codeine Ecotrin Regular or Extra Strength Naprosyn Uracel  Atromid-S Efficin Naproxen Ursinus  Auranofin Capsules Elmiron Neocylate Vanquish  Axotal Emagrin Norgesic Verin  Azathioprine Empirin or Empirin with Codeine Normiflo Vitamin E  Azolid Emprazil Nuprin Voltaren  Bayer Aspirin plain, buffered or children's or timed BC Tablets or powders Encaprin Orgaran Warfarin Sodium  Buff-a-Comp Enoxaparin Orudis Zorpin  Buff-a-Comp with Codeine Equegesic Os-Cal-Gesic   Buffaprin Excedrin plain, buffered or Extra Strength Oxalid   Bufferin Arthritis Strength Feldene Oxphenbutazone   Bufferin plain or Extra Strength Feldene Capsules Oxycodone with Aspirin    Bufferin with Codeine Fenoprofen Fenoprofen Pabalate or Pabalate-SF   Buffets II Flogesic Panagesic   Buffinol plain or Extra Strength Florinal or Florinal with Codeine Panwarfarin   Buf-Tabs Flurbiprofen Penicillamine   Butalbital Compound Four-way cold tablets Penicillin   Butazolidin Fragmin Pepto-Bismol   Carbenicillin Geminisyn Percodan   Carna Arthritis Reliever Geopen Persantine   Carprofen Gold's salt Persistin   Chloramphenicol Goody's Phenylbutazone   Chloromycetin Haltrain Piroxlcam   Clmetidine heparin Plaquenil   Cllnoril Hyco-pap Ponstel   Clofibrate Hydroxy chloroquine Propoxyphen         Before stopping any of these medications, be sure to consult the physician who ordered them.  Some, such as Coumadin (Warfarin) are ordered to prevent or treat serious conditions such as "deep thrombosis", "pumonary embolisms", and other heart problems.  The amount of time that you may need off of the medication may also vary with the medication and the reason for which you were taking it.  If you are taking any of these medications, please make sure you notify your pain physician before you undergo any procedures.         Pain Management Discharge Instructions  General Discharge Instructions :  If you need to reach your doctor call: Monday-Friday 8:00 am - 4:00 pm at [phone] or toll free [phone].  After clinic hours [phone] to have operator reach doctor.  Bring all of your medication bottles to all your appointments in the pain clinic.  To cancel or reschedule your appointment with Pain Management please remember to call 24 hours in advance to avoid a fee.  Refer to the educational materials which you have been given on: General Risks, I had my Procedure. Discharge Instructions, Post Sedation.  Post Procedure Instructions:  The drugs you were given will stay in your system until tomorrow, so for the next 24 hours you should not drive, make any legal decisions  or drink any alcoholic beverages.  You may eat anything you prefer, but it is better to start with liquids then soups and crackers, and gradually work up to solid foods.  Please notify your doctor immediately if you have any unusual bleeding, trouble breathing or pain  that is not related to your normal pain.  Depending on the type of procedure that was done, some parts of your body may feel week and/or numb.  This usually clears up by tonight or the next day.  Walk with the use of an assistive device or accompanied by an adult for the 24 hours.  You may use ice on the affected area for the first 24 hours.  Put ice in a Ziploc bag and cover with a towel and place against area 15 minutes on 15 minutes off.  You may switch to heat after 24 hours.Facet Blocks Patient Information  Description: The facets are joints in the spine between the vertebrae.  Like any joints in the body, facets can become irritated and painful.  Arthritis can also effect the facets.  By injecting steroids and local anesthetic in and around these joints, we can temporarily block the nerve supply to them.  Steroids act directly on irritated nerves and tissues to reduce selling and inflammation which often leads to decreased pain.  Facet blocks may be done anywhere along the spine from the neck to the low back depending upon the location of your pain.   After numbing the skin with local anesthetic (like Novocaine), a small needle is passed onto the facet joints under x-ray guidance.  You may experience a sensation of pressure while this is being done.  The entire block usually lasts about 15-25 minutes.   Conditions which may be treated by facet blocks:   Low back/buttock pain  Neck/shoulder pain  Certain types of headaches  Preparation for the injection:  1. Do not eat any solid food or dairy products within 8 hours of your appointment. 2. You may drink clear liquid up to 3 hours before appointment.  Clear liquids  include water, black coffee, juice or soda.  No milk or cream please. 3. You may take your regular medication, including pain medications, with a sip of water before your appointment.  Diabetics should hold regular insulin (if taken separately) and take 1/2 normal NPH dose the morning of the procedure.  Carry some sugar containing items with you to your appointment. 4. A driver must accompany you and be prepared to drive you home after your procedure. 5. Bring all your current medications with you. 6. An IV may be inserted and sedation may be given at the discretion of the physician. 7. A blood pressure cuff, EKG and other monitors will often be applied during the procedure.  Some patients may need to have extra oxygen administered for a short period. 8. You will be asked to provide medical information, including your allergies and medications, prior to the procedure.  We must know immediately if you are taking blood thinners (like Coumadin/Warfarin) or if you are allergic to IV iodine contrast (dye).  We must know if you could possible be pregnant.  Possible side-effects:   Bleeding from needle site  Infection (rare, may require surgery)  Nerve injury (rare)  Numbness & tingling (temporary)  Difficulty urinating (rare, temporary)  Spinal headache (a headache worse with upright posture)  Light-headedness (temporary)  Pain at injection site (serveral days)  Decreased blood pressure (rare, temporary)  Weakness in arm/leg (temporary)  Pressure sensation in back/neck (temporary)   Call if you experience:   Fever/chills associated with headache or increased back/neck pain  Headache worsened by an upright position  New onset, weakness or numbness of an extremity below the injection site  Hives or difficulty breathing (go to  the emergency room)  Inflammation or drainage at the injection site(s)  Severe back/neck pain greater than usual  New symptoms which are concerning to  you  Please note:  Although the local anesthetic injected can often make your back or neck feel good for several hours after the injection, the pain will likely return. It takes 3-7 days for steroids to work.  You may not notice any pain relief for at least one week.  If effective, we will often do a series of 2-3 injections spaced 3-6 weeks apart to maximally decrease your pain.  After the initial series, you may be a candidate for a more permanent nerve block of the facets.  If you have any questions, please call #[phone]) [facility]

## 2019-01-19 NOTE — Progress Notes (Signed)
Safety precautions to be maintained throughout the outpatient stay will include: orient to surroundings, keep bed in low position, maintain call bell within reach at all times, provide assistance with transfer out of bed and ambulation.  

## 2019-02-02 ENCOUNTER — Ambulatory Visit: Admit: 2019-02-02 | Payer: No Typology Code available for payment source | Admitting: Ophthalmology

## 2019-02-02 SURGERY — PHACOEMULSIFICATION, CATARACT, WITH IOL INSERTION
Anesthesia: Topical | Laterality: Right

## 2019-02-10 ENCOUNTER — Ambulatory Visit (HOSPITAL_BASED_OUTPATIENT_CLINIC_OR_DEPARTMENT_OTHER): Payer: Medicaid Other | Admitting: Pain Medicine

## 2019-02-10 ENCOUNTER — Ambulatory Visit
Admission: RE | Admit: 2019-02-10 | Discharge: 2019-02-10 | Disposition: A | Discharge status: Home / Self Care | Payer: Medicaid Other | Source: Ambulatory Visit | Attending: Pain Medicine | Admitting: Pain Medicine

## 2019-02-10 ENCOUNTER — Other Ambulatory Visit: Payer: Self-pay

## 2019-02-10 ENCOUNTER — Encounter: Payer: Self-pay | Admitting: Pain Medicine

## 2019-02-10 VITALS — BP 105/77 | HR 68 | Temp 98.1°F | Resp 16 | Ht 61.0 in | Wt 210.0 lb

## 2019-02-10 DIAGNOSIS — G8929 Other chronic pain: Secondary | ICD-10-CM | POA: Diagnosis present

## 2019-02-10 DIAGNOSIS — M5137 Other intervertebral disc degeneration, lumbosacral region: Secondary | ICD-10-CM | POA: Diagnosis present

## 2019-02-10 DIAGNOSIS — M545 Low back pain: Secondary | ICD-10-CM

## 2019-02-10 DIAGNOSIS — M47816 Spondylosis without myelopathy or radiculopathy, lumbar region: Secondary | ICD-10-CM

## 2019-02-10 DIAGNOSIS — M47817 Spondylosis without myelopathy or radiculopathy, lumbosacral region: Secondary | ICD-10-CM

## 2019-02-10 MED ORDER — LIDOCAINE HCL 2 % IJ SOLN
20.0000 mL | Freq: Once | INTRAMUSCULAR | Status: AC
  Administered 2019-02-10: 11:00:00 400 mg
  Filled 2019-02-10: qty 40

## 2019-02-10 MED ORDER — MIDAZOLAM HCL 5 MG/5ML IJ SOLN
1.0000 mg | INTRAMUSCULAR | Status: AC | PRN
  Administered 2019-02-10: 11:00:00 4 mg via INTRAVENOUS
  Filled 2019-02-10: qty 5

## 2019-02-10 MED ORDER — ROPIVACAINE HCL 2 MG/ML IJ SOLN
18.0000 mL | Freq: Once | INTRAMUSCULAR | Status: AC
  Administered 2019-02-10: 11:00:00 18 mL via PERINEURAL
  Filled 2019-02-10: qty 20

## 2019-02-10 MED ORDER — TRIAMCINOLONE ACETONIDE 40 MG/ML IJ SUSP
80.0000 mg | Freq: Once | INTRAMUSCULAR | Status: AC
  Administered 2019-02-10: 11:00:00 80 mg
  Filled 2019-02-10: qty 2

## 2019-02-10 MED ORDER — FENTANYL CITRATE (PF) 100 MCG/2ML IJ SOLN
25.0000 ug | INTRAMUSCULAR | Status: AC | PRN
  Administered 2019-02-10: 50 ug via INTRAVENOUS
  Filled 2019-02-10: qty 2

## 2019-02-10 MED ORDER — LACTATED RINGERS IV SOLN
1000.0000 mL | Freq: Once | INTRAVENOUS | Status: AC
  Administered 2019-02-10: 1000 mL via INTRAVENOUS

## 2019-02-10 NOTE — Progress Notes (Signed)
Patient's Name: Shannon Benitez  MRN: [id number]  Referring Provider: Marygrace Drought, MD  DOB: 1971-01-06  PCP: Marygrace Drought, MD  DOS: 02/10/2019  Note by: [name], MD  Service setting: Ambulatory outpatient  Specialty: Interventional Pain Management  Patient type: Established  Location: [facility] (AMB) Pain Management Facility  Visit type: Interventional Procedure   Primary Reason for Visit: Interventional Pain Management Treatment. CC: Back Pain (lower)  Procedure:          Anesthesia, Analgesia, Anxiolysis:  Type: Lumbar Facet, Medial Branch Block(s)          Primary Purpose: Diagnostic Region: Posterolateral Lumbosacral Spine Level: L2, L3, L4, L5, & S1 Medial Branch Level(s). Injecting these levels blocks the L3-4, L4-5, and L5-S1 lumbar facet joints. Laterality: Bilateral  Type: Moderate (Conscious) Sedation combined with Local Anesthesia Indication(s): Analgesia and Anxiety Route: Intravenous (IV) IV Access: Secured Sedation: Meaningful verbal contact was maintained at all times during the procedure  Local Anesthetic: Lidocaine 1-2%  Position: Prone   Indications: 1. Lumbar facet syndrome (Bilateral) (L>R)   2. Spondylosis without myelopathy or radiculopathy, lumbosacral region   3. Lumbar facet hypertrophy (Multilevel) (Bilateral)   4. Chronic low back pain (Bilateral) (L>R) w/o sciatica   5. DDD (degenerative disc disease), lumbosacral   6. Osteoarthritis of lumbar spine    Pain Score: Pre-procedure: 3 /10 Post-procedure: 0-No pain/10  Pre-op Assessment:  Shannon Benitez is a 48 y.o. (year old), female patient, seen today for interventional treatment. She  has a past surgical history that includes Abdominal hysterectomy; Cesarean section; and Tumor removal (Left). Shannon Benitez has a current medication list which includes the following prescription(s): baclofen, dimethyl fumarate, duloxetine, gabapentin, gabapentin, lamotrigine, meloxicam, metformin,  omeprazole, propranolol, sitagliptin, solifenacin, clonazepam, and primidone. Her primarily concern today is the Back Pain (lower)  Initial Vital Signs:  Pulse/HCG Rate: 74ECG Heart Rate: 71 Temp: 98.8 F (37.1 C) Resp: 12 BP: 109/70 SpO2: 99 %  BMI: Estimated body mass index is 39.68 kg/m as calculated from the following:   Height as of this encounter: 5\' 1"  (1.549 m).   Weight as of this encounter: 210 lb (95.3 kg).  Risk Assessment: Allergies: Reviewed. She is allergic to glatiramer; latex; and rebif [interferon beta-1a].  Allergy Precautions: None required Coagulopathies: Reviewed. None identified.  Blood-thinner therapy: None at this time Active Infection(s): Reviewed. None identified. Shannon Benitez is afebrile  Site Confirmation: Shannon Benitez was asked to confirm the procedure and laterality before marking the site Procedure checklist: Completed Consent: Before the procedure and under the influence of no sedative(s), amnesic(s), or anxiolytics, the patient was informed of the treatment options, risks and possible complications. To fulfill our ethical and legal obligations, as recommended by the American Medical Association's Code of Ethics, I have informed the patient of my clinical impression; the nature and purpose of the treatment or procedure; the risks, benefits, and possible complications of the intervention; the alternatives, including doing nothing; the risk(s) and benefit(s) of the alternative treatment(s) or procedure(s); and the risk(s) and benefit(s) of doing nothing. The patient was provided information about the general risks and possible complications associated with the procedure. These may include, but are not limited to: failure to achieve desired goals, infection, bleeding, organ or nerve damage, allergic reactions, paralysis, and death. In addition, the patient was informed of those risks and complications associated to Spine-related procedures, such as  failure to decrease pain; infection (i.e.: Meningitis, epidural or intraspinal abscess); bleeding (i.e.: epidural hematoma, subarachnoid hemorrhage, or any other type  of intraspinal or peri-dural bleeding); organ or nerve damage (i.e.: Any type of peripheral nerve, nerve root, or spinal cord injury) with subsequent damage to sensory, motor, and/or autonomic systems, resulting in permanent pain, numbness, and/or weakness of one or several areas of the body; allergic reactions; (i.e.: anaphylactic reaction); and/or death. Furthermore, the patient was informed of those risks and complications associated with the medications. These include, but are not limited to: allergic reactions (i.e.: anaphylactic or anaphylactoid reaction(s)); adrenal axis suppression; blood sugar elevation that in diabetics may result in ketoacidosis or comma; water retention that in patients with history of congestive heart failure may result in shortness of breath, pulmonary edema, and decompensation with resultant heart failure; weight gain; swelling or edema; medication-induced neural toxicity; particulate matter embolism and blood vessel occlusion with resultant organ, and/or nervous system infarction; and/or aseptic necrosis of one or more joints. Finally, the patient was informed that Medicine is not an exact science; therefore, there is also the possibility of unforeseen or unpredictable risks and/or possible complications that may result in a catastrophic outcome. The patient indicated having understood very clearly. We have given the patient no guarantees and we have made no promises. Enough time was given to the patient to ask questions, all of which were answered to the patient's satisfaction. Shannon Benitez has indicated that she wanted to continue with the procedure. Attestation: I, the ordering provider, attest that I have discussed with the patient the benefits, risks, side-effects, alternatives, likelihood of achieving goals,  and potential problems during recovery for the procedure that I have provided informed consent. Date   Time: 02/10/2019 10:15 AM  Pre-Procedure Preparation:  Monitoring: As per clinic protocol. Respiration, ETCO2, SpO2, BP, heart rate and rhythm monitor placed and checked for adequate function Safety Precautions: Patient was assessed for positional comfort and pressure points before starting the procedure. Time-out: I initiated and conducted the "Time-out" before starting the procedure, as per protocol. The patient was asked to participate by confirming the accuracy of the "Time Out" information. Verification of the correct person, site, and procedure were performed and confirmed by me, the nursing staff, and the patient. "Time-out" conducted as per Joint Commission's Universal Protocol (UP.01.01.01). Time: 1103  Description of Procedure:          Laterality: Bilateral. The procedure was performed in identical fashion on both sides. Levels:  L2, L3, L4, L5, & S1 Medial Branch Level(s) Area Prepped: Posterior Lumbosacral Region Prepping solution: ChloraPrep (2% chlorhexidine gluconate and 70% isopropyl alcohol) Safety Precautions: Aspiration looking for blood return was conducted prior to all injections. At no point did we inject any substances, as a needle was being advanced. Before injecting, the patient was told to immediately notify me if she was experiencing any new onset of "ringing in the ears, or metallic taste in the mouth". No attempts were made at seeking any paresthesias. Safe injection practices and needle disposal techniques used. Medications properly checked for expiration dates. SDV (single dose vial) medications used. After the completion of the procedure, all disposable equipment used was discarded in the proper designated medical waste containers. Local Anesthesia: Protocol guidelines were followed. The patient was positioned over the fluoroscopy table. The area was prepped in the  usual manner. The time-out was completed. The target area was identified using fluoroscopy. A 12-in long, straight, sterile hemostat was used with fluoroscopic guidance to locate the targets for each level blocked. Once located, the skin was marked with an approved surgical skin marker. Once all sites were marked,  the skin (epidermis, dermis, and hypodermis), as well as deeper tissues (fat, connective tissue and muscle) were infiltrated with a small amount of a short-acting local anesthetic, loaded on a 10cc syringe with a 25G, 1.5-in  Needle. An appropriate amount of time was allowed for local anesthetics to take effect before proceeding to the next step. Local Anesthetic: Lidocaine 2.0% The unused portion of the local anesthetic was discarded in the proper designated containers. Technical explanation of process:  L2 Medial Branch Nerve Block (MBB): The target area for the L2 medial branch is at the junction of the postero-lateral aspect of the superior articular process and the superior, posterior, and medial edge of the transverse process of L3. Under fluoroscopic guidance, a Quincke needle was inserted until contact was made with os over the superior postero-lateral aspect of the pedicular shadow (target area). After negative aspiration for blood, 0.5 mL of the nerve block solution was injected without difficulty or complication. The needle was removed intact. L3 Medial Branch Nerve Block (MBB): The target area for the L3 medial branch is at the junction of the postero-lateral aspect of the superior articular process and the superior, posterior, and medial edge of the transverse process of L4. Under fluoroscopic guidance, a Quincke needle was inserted until contact was made with os over the superior postero-lateral aspect of the pedicular shadow (target area). After negative aspiration for blood, 0.5 mL of the nerve block solution was injected without difficulty or complication. The needle was removed  intact. L4 Medial Branch Nerve Block (MBB): The target area for the L4 medial branch is at the junction of the postero-lateral aspect of the superior articular process and the superior, posterior, and medial edge of the transverse process of L5. Under fluoroscopic guidance, a Quincke needle was inserted until contact was made with os over the superior postero-lateral aspect of the pedicular shadow (target area). After negative aspiration for blood, 0.5 mL of the nerve block solution was injected without difficulty or complication. The needle was removed intact. L5 Medial Branch Nerve Block (MBB): The target area for the L5 medial branch is at the junction of the postero-lateral aspect of the superior articular process and the superior, posterior, and medial edge of the sacral ala. Under fluoroscopic guidance, a Quincke needle was inserted until contact was made with os over the superior postero-lateral aspect of the pedicular shadow (target area). After negative aspiration for blood, 0.5 mL of the nerve block solution was injected without difficulty or complication. The needle was removed intact. S1 Medial Branch Nerve Block (MBB): The target area for the S1 medial branch is at the posterior and inferior 6 o'clock position of the L5-S1 facet joint. Under fluoroscopic guidance, the Quincke needle inserted for the L5 MBB was redirected until contact was made with os over the inferior and postero aspect of the sacrum, at the 6 o' clock position under the L5-S1 facet joint (Target area). After negative aspiration for blood, 0.5 mL of the nerve block solution was injected without difficulty or complication. The needle was removed intact.  Nerve block solution: 0.2% PF-Ropivacaine + Triamcinolone (40 mg/mL) diluted to a final concentration of 4 mg of Triamcinolone/mL of Ropivacaine The unused portion of the solution was discarded in the proper designated containers. Procedural Needles: 22-gauge, 3.5-inch, Quincke  needles used for all levels.  Once the entire procedure was completed, the treated area was cleaned, making sure to leave some of the prepping solution back to take advantage of its long term bactericidal properties.  Illustration of the posterior view of the lumbar spine and the posterior neural structures. Laminae of L2 through S1 are labeled. DPRL5, dorsal primary ramus of L5; DPRS1, dorsal primary ramus of S1; DPR3, dorsal primary ramus of L3; FJ, facet (zygapophyseal) joint L3-L4; I, inferior articular process of L4; LB1, lateral branch of dorsal primary ramus of L1; IAB, inferior articular branches from L3 medial branch (supplies L4-L5 facet joint); IBP, intermediate branch plexus; MB3, medial branch of dorsal primary ramus of L3; NR3, third lumbar nerve root; S, superior articular process of L5; SAB, superior articular branches from L4 (supplies L4-5 facet joint also); TP3, transverse process of L3.  Vitals:   02/10/19 1115 02/10/19 1125 02/10/19 1135 02/10/19 1146  BP: 110/68 105/72 124/67 105/77  Pulse: 68     Resp: 16 16 15 16   Temp:  98 F (36.7 C)  98.1 F (36.7 C)  TempSrc:      SpO2: 99% 97% 100% 99%  Weight:      Height:         Start Time: 1103 hrs. End Time:   hrs.  Imaging Guidance (Spinal):          Type of Imaging Technique: Fluoroscopy Guidance (Spinal) Indication(s): Assistance in needle guidance and placement for procedures requiring needle placement in or near specific anatomical locations not easily accessible without such assistance. Exposure Time: Please see nurses notes. Contrast: None used. Fluoroscopic Guidance: I was personally present during the use of fluoroscopy. "Tunnel Vision Technique" used to obtain the best possible view of the target area. Parallax error corrected before commencing the procedure. "Direction-depth-direction" technique used to introduce the needle under continuous pulsed fluoroscopy. Once target was reached, antero-posterior,  oblique, and lateral fluoroscopic projection used confirm needle placement in all planes. Images permanently stored in EMR. Interpretation: No contrast injected. I personally interpreted the imaging intraoperatively. Adequate needle placement confirmed in multiple planes. Permanent images saved into the patient's record.  Antibiotic Prophylaxis:   Anti-infectives (From admission, onward)   None     Indication(s): None identified  Post-operative Assessment:  Post-procedure Vital Signs:  Pulse/HCG Rate: 6871 Temp: 98.1 F (36.7 C) Resp: 16 BP: 105/77 SpO2: 99 %  EBL: None  Complications: No immediate post-treatment complications observed by team, or reported by patient.  Note: The patient tolerated the entire procedure well. A repeat set of vitals were taken after the procedure and the patient was kept under observation following institutional policy, for this type of procedure. Post-procedural neurological assessment was performed, showing return to baseline, prior to discharge. The patient was provided with post-procedure discharge instructions, including a section on how to identify potential problems. Should any problems arise concerning this procedure, the patient was given instructions to immediately contact us, at any time, without hesitation. In any case, we plan to contact the patient by telephone for a follow-up status report regarding this interventional procedure.  Comments:  No additional relevant information.  Plan of Care  Orders:  Orders Placed This Encounter  Procedures   LUMBAR FACET(MEDIAL BRANCH NERVE BLOCK) MBNB    Scheduling Instructions:     Side: Bilateral     Level: L3-4, L4-5, & L5-S1 Facets (L2, L3, L4, L5, & S1 Medial Branch Nerves)     Sedation: With Sedation.     Timeframe: Today    Order Specific Question:   Where will this procedure be performed?    Answer:   [facility]   DG C-Arm 1-60 Min-No Report    Intraoperative  interpretation by  procedural physician at [place].    Standing Status:   Standing    Number of Occurrences:   1    Order Specific Question:   Reason for exam:    Answer:   Assistance in needle guidance and placement for procedures requiring needle placement in or near specific anatomical locations not easily accessible without such assistance.   Provider attestation of informed consent for procedure/surgical case    I, the ordering provider, attest that I have discussed with the patient the benefits, risks, side effects, alternatives, likelihood of achieving goals and potential problems during recovery for the procedure that I have provided informed consent.    Standing Status:   Standing    Number of Occurrences:   1   Informed Consent Details: Transcribe to consent form and obtain patient signature    Standing Status:   Standing    Number of Occurrences:   1    Order Specific Question:   Procedure    Answer:   Bilateral Lumbar facet block (medial branch block) under fluoroscopic guidance. (See notes for levels.)    Order Specific Question:   Surgeon    Answer:   Iva Posten A. Dossie Arbour, MD    Order Specific Question:   Indication/Reason    Answer:   Bilateral low back pain with or without lower extremity pain   Medications ordered for procedure: Meds ordered this encounter  Medications   lidocaine (XYLOCAINE) 2 % (with pres) injection 400 mg   lactated ringers infusion 1,000 mL   midazolam (VERSED) 5 MG/5ML injection 1-2 mg    Make sure Flumazenil is available in the pyxis when using this medication. If oversedation occurs, administer 0.2 mg IV over 15 sec. If after 45 sec no response, administer 0.2 mg again over 1 min; may repeat at 1 min intervals; not to exceed 4 doses (1 mg)   fentaNYL (SUBLIMAZE) injection 25-50 mcg    Make sure Narcan is available in the pyxis when using this medication. In the event of respiratory depression (RR< 8/min): Titrate NARCAN (naloxone) in increments  of 0.1 to 0.2 mg IV at 2-3 minute intervals, until desired degree of reversal.   ropivacaine (PF) 2 mg/mL (0.2%) (NAROPIN) injection 18 mL   triamcinolone acetonide (KENALOG-40) injection 80 mg   Medications administered: We administered lidocaine, lactated ringers, midazolam, fentaNYL, ropivacaine (PF) 2 mg/mL (0.2%), and triamcinolone acetonide.  See the medical record for exact dosing, route, and time of administration.  Disposition: Discharge home  Discharge Date & Time: 02/10/2019; 1146 hrs.   Follow-up plan:   Return for EPP (2 wks), w/ Dr. Dossie Arbour, (Virtual).     Future Appointments  Date Time Provider [place]  02/25/2019  8:30 AM Milinda Pointer, MD [facility] None   Primary Care Physician: Marygrace Drought, MD Location: [facility] Note by: [name], MD Date: 02/10/2019; Time: 11:46 AM  Disclaimer:  Medicine is not an exact science. The only guarantee in medicine is that nothing is guaranteed. It is important to note that the decision to proceed with this intervention was based on the information collected from the patient. The Data and conclusions were drawn from the patient's questionnaire, the interview, and the physical examination. Because the information was provided in large part by the patient, it cannot be guaranteed that it has not been purposely or unconsciously manipulated. Every effort has been made to obtain as much relevant data as possible for this evaluation. It is  important to note that the conclusions that lead to this procedure are derived in large part from the available data. Always take into account that the treatment will also be dependent on availability of resources and existing treatment guidelines, considered by other Pain Management Practitioners as being common knowledge and practice, at the time of the intervention. For Medico-Legal purposes, it is also important to point out that variation in procedural  techniques and pharmacological choices are the acceptable norm. The indications, contraindications, technique, and results of the above procedure should only be interpreted and judged by a Board-Certified Interventional Pain Specialist with extensive familiarity and expertise in the same exact procedure and technique.

## 2019-02-10 NOTE — Progress Notes (Signed)
Safety precautions to be maintained throughout the outpatient stay will include: orient to surroundings, keep bed in low position, maintain call bell within reach at all times, provide assistance with transfer out of bed and ambulation.  

## 2019-02-10 NOTE — Patient Instructions (Addendum)

## 2019-02-11 ENCOUNTER — Telehealth: Payer: Self-pay

## 2019-02-11 NOTE — Telephone Encounter (Signed)
Post procedure phone call.  Patient states she is doing "same ole, same ole"  Instructed patient to be very specific on her pain diary and to call us for any further questions or concerns.

## 2019-02-23 ENCOUNTER — Other Ambulatory Visit: Payer: Self-pay

## 2019-02-23 ENCOUNTER — Ambulatory Visit: Payer: Medicaid Other | Attending: Pain Medicine | Admitting: Pain Medicine

## 2019-02-23 DIAGNOSIS — M5442 Lumbago with sciatica, left side: Secondary | ICD-10-CM | POA: Diagnosis not present

## 2019-02-23 DIAGNOSIS — G894 Chronic pain syndrome: Secondary | ICD-10-CM | POA: Diagnosis not present

## 2019-02-23 DIAGNOSIS — G35 Multiple sclerosis: Secondary | ICD-10-CM

## 2019-02-23 DIAGNOSIS — E1142 Type 2 diabetes mellitus with diabetic polyneuropathy: Secondary | ICD-10-CM

## 2019-02-23 DIAGNOSIS — G8929 Other chronic pain: Secondary | ICD-10-CM

## 2019-02-23 DIAGNOSIS — M79605 Pain in left leg: Secondary | ICD-10-CM

## 2019-02-23 DIAGNOSIS — M5441 Lumbago with sciatica, right side: Secondary | ICD-10-CM

## 2019-02-23 DIAGNOSIS — M79604 Pain in right leg: Secondary | ICD-10-CM

## 2019-02-23 DIAGNOSIS — M47816 Spondylosis without myelopathy or radiculopathy, lumbar region: Secondary | ICD-10-CM

## 2019-02-23 DIAGNOSIS — M792 Neuralgia and neuritis, unspecified: Secondary | ICD-10-CM

## 2019-02-23 DIAGNOSIS — M62838 Other muscle spasm: Secondary | ICD-10-CM

## 2019-02-23 MED ORDER — BACLOFEN 10 MG PO TABS: 20.0000 mg | ORAL_TABLET | Freq: Four times a day (QID) | ORAL | 0 refills | Status: DC

## 2019-02-23 MED ORDER — MELOXICAM 15 MG PO TABS: 15.0000 mg | ORAL_TABLET | Freq: Every day | ORAL | 0 refills | Status: DC

## 2019-02-23 MED ORDER — OXYCODONE HCL 5 MG PO TABS: 5.0000 mg | ORAL_TABLET | Freq: Two times a day (BID) | ORAL | 0 refills | Status: DC | PRN

## 2019-02-23 MED ORDER — PREGABALIN 25 MG PO CAPS: 25.0000 mg | ORAL_CAPSULE | Freq: Three times a day (TID) | ORAL | 0 refills | Status: DC

## 2019-02-23 NOTE — Progress Notes (Signed)
Pain Management Virtual Encounter Note - Virtual Visit via Telephone Telehealth (real-time audio visits between healthcare provider and patient).  Patient's Phone No. & Preferred Pharmacy:  [phone] (home); There is no such number on file (mobile).; (Preferred) [phone] [email or web address]  [place], [place] [address] Phone: [phone] Fax: [phone]   Pre-screening note:  Our staff contacted Shannon Benitez and offered her an "in person", "face-to-face" appointment versus a telephone encounter. She indicated preferring the telephone encounter, at this time.  Reason for Virtual Visit: COVID-19*  Social distancing based on CDC and AMA recommendations.   I contacted Shannon Benitez on 02/23/2019 at 10:39 AM via telephone and clearly identified myself as [name], MD. I verified that I was speaking with the correct person using two identifiers (Name and date of birth: Apr 21, 1971).  Advanced Informed Consent I sought verbal advanced consent from Shannon Benitez for virtual visit interactions. I informed Shannon Benitez of possible security and privacy concerns, risks, and limitations associated with providing "not-in-person" medical evaluation and management services. I also informed Shannon Benitez of the availability of "in-person" appointments. Finally, I informed her that there would be a charge for the virtual visit and that she could be  personally, fully or partially, financially responsible for it. Shannon Benitez expressed understanding and agreed to proceed.   Historic Elements   Shannon Benitez is a 48 y.o. year old, female patient evaluated today after her last encounter by our practice on 02/11/2019. Shannon Benitez  has a past medical history of Diabetes mellitus without complication ([place]) and MS (multiple sclerosis) ([place]). She also  has a past Shannon history that includes Abdominal hysterectomy;  Cesarean section; and Tumor removal ([place]). Shannon Benitez has a current medication list which includes the following prescription(s): dimethyl fumarate, duloxetine, gabapentin, lamotrigine, metformin, mirabegron er, omeprazole, primidone, propranolol, sitagliptin, baclofen, meloxicam, oxycodone, and pregabalin. She  reports that she quit smoking about 2 years ago. Her smoking use included cigarettes. She has quit using smokeless tobacco. She reports that she does not drink alcohol. No history on file for drug. Shannon Benitez is allergic to glatiramer; latex; and rebif [interferon beta-1a].   HPI  I last saw her on 02/10/2019. She is being evaluated for a post-procedure assessment.  According to the patient she experienced 100% relief of the pain for the duration of local anesthetic.  Unfortunately, once the local anesthetic wore off, she did not get any long-term benefit from the steroids.  With this would suggest this that the patient's pain appears to be coming from the area of the facet joints, but the mechanism sustaining this pain is not an inflammatory one.  This would suggest that the patient's etiology of the pain is 1 associated with mechanical pathology of the facet joints.  Because of this, she would be an excellent candidate for radiofrequency ablation.  Unfortunately, her BMI is still high and we are working on lowering this before we proceed with that radiofrequency ablation.  Today we went over the patient's medications and the first thing that we had to do was to explain to her the difference between medications that "helped" with her pain vs. complete elimination of her pain which is what her expectations were.  Once we addressed these unrealistic expectations and I explained to her that her pain was a combination of neuropathic pain, arthralgia, and musculoskeletal somatic pain, then it was easier to explain to her how different medicines would contribute  towards getting her better.  We spent a  great deal of time with the membrane stabilizers since she claims that she does not get any type of benefit by taking gabapentin 800 mg 3 times daily.  I seriously doubt that this is the case.  I simply think that she has difficulties identifying these benefits.  Because of this, the first thing that I suggest that she do is to start going down her gabapentin from 800 mg 3 times daily to twice daily, and then to 4 times daily before completely stopping it.  Once she stops the gabapentin she was asked to reassess her pain and then let me know whether or not she truly felt that the gabapentin was not contributing to her analgesia.  I also told the patient that in the event that the gabapentin was really not contributing to her analgesia, we would go ahead and do a trial of Lyrica.  She stated that she has had Lyrica in the past and that "also did not help".  However, in looking at her [place], it would seem that she did get a total of 2 prescriptions for Lyrica 75 mg to be taken twice daily, but then it was stopped.  Clearly this is not an adequate trial and therefore today I will write a prescription for Lyrica 25 mg to be taken 1 tablet p.o. 3 times daily and I will continue to increase it until she reaches a the maximum dose of 450 mg/day or she gets some side effects from the medication.  After having addressed the issue of the membrane stabilizers and then neuropathic component of her pain, then we moved onto the arthralgia.  I have provided her with a prescription for Mobic 15 mg p.o. daily which she states that she has take in the past.  Before prescribing the medicine I checked her latest kidney function labs and all of it seemed to be okay.  In addition, since she does have an elevated sed rate, I would seem that she would benefit from some type of anti-inflammatory medication.  Following this we moved our attention to the muscle relaxants.  Here she had no  problem in admitting that the baclofen does provide her with good relief of the pain and she has also noticed that when the effects of the baclofen are going down, she feels more stiffness in the legs.  Therefore, will continue with this medicine.  Finally, we moved to the opioid analgesics.  Her entire visit today seem to have been geared towards getting to that point.  It would appear that she has to use some oxycodone/APAP 5/325 twice daily in the past, and primarily for pain from a fracture.  Because she has tried this medication in the past, I went ahead and wrote her a prescription for oxycodone IR 5 mg to be taken 1 tablet p.o. twice daily PRN for pain.   She was given all the appropriate warnings and she was instructed to go into "my chart" and look for today's after visit summary so that she can n in the past with some benefit and no side effects.read the information that I put in there about our medication rules, regulations, and policies.  I also make sure to tell her that this medication cannot be mixed with any type of alcohol or benzodiazepine since it can lead to respiratory depression and death.    Post-Procedure Evaluation  Procedure: Diagnostic bilateral lumbar facet block #2 under  fluoroscopic guidance and IV sedation Pre-procedure pain level:  3/10 Post-procedure: 0/10 (100% relief)  Sedation: Sedation provided.  Effectiveness during initial hour after procedure(Ultra-Short Term Relief): 100 %  Local anesthetic used: Long-acting (4-6 hours) Effectiveness: Defined as any analgesic benefit obtained secondary to the administration of local anesthetics. This carries significant diagnostic value as to the etiological location, or anatomical origin, of the pain. Duration of benefit is expected to coincide with the duration of the local anesthetic used.  Effectiveness during initial 4-6 hours after procedure(Short-Term Relief): 100 % x 4 hrs.  Long-term benefit: Defined as any relief past  the pharmacologic duration of the local anesthetics.  Effectiveness past the initial 6 hours after procedure(Long-Term Relief): 0 %  Current benefits: Defined as benefit that persist at this time.   Analgesia:  Back to baseline Function: Back to baseline ROM: Back to baseline  Review of recent tests  DG C-Arm 1-60 Min-No Report Fluoroscopy was utilized by the requesting physician.  No radiographic  interpretation.    Office Visit on 11/27/2018  Component Date Value Ref Range Status  . Summary 11/27/2018 FINAL   Final   Comment: ==================================================================== [facility] COMP DRUG ANALYSIS,UR ==================================================================== Test                             Result       Flag       Units Drug Present and Declared for Prescription Verification   7-aminoclonazepam              319          EXPECTED   ng/mg creat    7-aminoclonazepam is an expected metabolite of clonazepam. Source    of clonazepam is a scheduled prescription medication.   Primidone                      PRESENT      EXPECTED   Phenobarbital                  PRESENT      EXPECTED    Phenobarbital is an expected metabolite of primidone;    Phenobarbital may also be administered as a prescription drug.   Gabapentin                     PRESENT      EXPECTED   Lamotrigine                    PRESENT      EXPECTED   Baclofen                       PRESENT      EXPECTED   Duloxetine                     PRESENT      EXPECTED   Propranolol                    PRESENT      E                          XPECTED Drug Present not Declared for Prescription Verification   Metaxalone                     PRESENT      UNEXPECTED  Acetaminophen                  PRESENT      UNEXPECTED   Diphenhydramine                PRESENT      UNEXPECTED ==================================================================== Test                      Result    Flag   Units      Ref  Range   Creatinine              47               mg/dL      >=20 ==================================================================== Declared Medications:  The flagging and interpretation on this report are based on the  following declared medications.  Unexpected results may arise from  inaccuracies in the declared medications.  **Note: The testing scope of this panel includes these medications:  Baclofen (Lioresal)  Clonazepam (Klonopin)  Duloxetine (Cymbalta)  Gabapentin (Neurontin)  Lamotrigine (Lamictal)  Primidone (Mysoline)  Propranolol (Inderal)  **Note: The testing scope of this panel does not includ                          e following  reported medications:  Dimethyl Fumarate  Metformin (Glucophage)  Mirabegron (Myrbetriq)  Omeprazole (Prilosec)  Sitagliptin (Januvia) ==================================================================== For clinical consultation, please call [phone]. ====================================================================   . Glucose 11/27/2018 103* 65 - 99 mg/dL Final  . BUN 11/27/2018 10  6 - 24 mg/dL Final  . Creatinine, Ser 11/27/2018 0.79  0.57 - 1.00 mg/dL Final  . GFR calc non Af Amer 11/27/2018 89  >59 mL/min/1.73 Final  . GFR calc Af Amer 11/27/2018 102  >59 mL/min/1.73 Final  . BUN/Creatinine Ratio 11/27/2018 13  9 - 23 Final  . Sodium 11/27/2018 142  134 - 144 mmol/L Final  . Potassium 11/27/2018 4.1  3.5 - 5.2 mmol/L Final  . Chloride 11/27/2018 102  96 - 106 mmol/L Final  . Calcium 11/27/2018 9.8  8.7 - 10.2 mg/dL Final  . Total Protein 11/27/2018 7.5  6.0 - 8.5 g/dL Final  . Albumin 11/27/2018 4.1  3.8 - 4.8 g/dL Final                 **Please note reference interval change**  . Globulin, Total 11/27/2018 3.4  1.5 - 4.5 g/dL Final  . Albumin/Globulin Ratio 11/27/2018 1.2  1.2 - 2.2 Final  . Bilirubin Total 11/27/2018 <0.2  0.0 - 1.2 mg/dL Final  . Alkaline Phosphatase 11/27/2018 91  39 - 117 IU/L Final  . AST  11/27/2018 17  0 - 40 IU/L Final  . Magnesium 11/27/2018 2.2  1.6 - 2.3 mg/dL Final  . Vitamin B-12 11/27/2018 372  232 - 1,245 pg/mL Final  . Sed Rate 11/27/2018 42* 0 - 32 mm/hr Final  . 25-Hydroxy, Vitamin D 11/27/2018 55  ng/mL Final   Comment: Reference Range: All Ages: Target levels 30 - 100   . 25-Hydroxy, Vitamin D-2 11/27/2018 <1.0  ng/mL Final  . 25-Hydroxy, Vitamin D-3 11/27/2018 55  ng/mL Final  . CRP 11/27/2018 10  0 - 10 mg/L Final   Assessment  The primary encounter diagnosis was Chronic pain syndrome. 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  Plan of Care  I have discontinued Shannon Benitez's clonazePAM, solifenacin, and meloxicam. I have also changed her baclofen. Additionally, I am having her start on meloxicam, pregabalin, and oxyCODONE. Lastly, I am having her maintain her propranolol, primidone, omeprazole, sitaGLIPtin, metFORMIN, lamoTRIgine, Dimethyl Fumarate, gabapentin, DULoxetine, and mirabegron ER.  Pharmacotherapy (Medications Ordered): Meds ordered this encounter  Medications  . baclofen (LIORESAL) 10 MG tablet    Sig: Take 2 tablets (20 mg total) by mouth 4 (four) times daily for 30 days.    Dispense:  240 tablet    Refill:  0    Do not place medication on "Automatic Refill". Fill one day early if pharmacy is closed on scheduled refill date.  . meloxicam (MOBIC) 15 MG tablet    Sig: Take 1 tablet (15 mg total) by mouth daily for 30 days.    Dispense:  30 tablet    Refill:  0    Do not add this medication to the electronic "Automatic Refill" notification system. Patient may have prescription filled one day early if  pharmacy is closed on scheduled refill date.  . pregabalin (LYRICA) 25 MG capsule    Sig: Take 1 capsule (25 mg total) by mouth 3 (three) times daily for 30 days.    Dispense:  90 capsule    Refill:  0    Do not place this medication, or any other prescription from our practice, on "Automatic Refill". Patient may have prescription filled one day early if pharmacy is closed on scheduled refill date.  Marland Kitchen oxyCODONE (OXY IR/ROXICODONE) 5 MG immediate release tablet    Sig: Take 1 tablet (5 mg total) by mouth 2 (two) times daily as needed for up to 30 days for severe pain. Must last 30 days.    Dispense:  60 tablet    Refill:  0    [place] STOP ACT - Not applicable to Chronic Pain Syndrome (G89.4) diagnosis. Fill one day early if pharmacy is closed on scheduled refill date. Do not fill until: 02/23/19. To last until: 03/25/19.   Orders:  No orders of the defined types were placed in this encounter.  Follow-up plan:   Return in about 1 month (around 03/25/2019) for Med-Mgmt, w/ Dr. Dossie Arbour.  At that time, we will review how she is doing on the Lyrica and we will continue to increase it until we get to 450 mg/day.  We will also review whether or not the gabapentin was helping her with her pain.  Will also go over her new prescription of oxycodone 5 mg p.o. twice daily to see if she had any problems with this.  The long-term goal is to have her drop her BMI below 35 at which time we plan to then do the radiofrequency ablation. NOTE: (NO RFA until BMI < 35)   Interventional management options: Considering:   Diagnostic bilateral lumbar facet block Possible bilateral lumbar facet RFA(NO RFA until BMI < 35)   Palliative PRN treatment(s):   Palliative bilateral lumbar facet blocks    I discussed the assessment and treatment plan with the patient. The patient was provided an opportunity to ask questions and all were answered. The patient agreed with the plan and demonstrated an understanding of the  instructions.  Patient advised to call back or seek an in-person evaluation if the symptoms or condition worsens.  Total  duration of non-face-to-face encounter: 40 minutes.  Note by: [name], MD Date: 02/23/2019; Time: 10:39 AM  Disclaimer:  * Given the special circumstances of the COVID-19 pandemic, the federal government has announced that the Office for Civil Rights (OCR) will exercise its enforcement discretion and will not impose penalties on physicians using telehealth in the event of noncompliance with regulatory requirements under the [place] and Accountability Act (HIPAA) in connection with the good faith provision of telehealth during the WUGQB-16 national public health emergency. ([place])

## 2019-02-23 NOTE — Patient Instructions (Signed)
____________________________________________________________________________________________  Medication Rules  Purpose: To inform patients, and their family members, of our rules and regulations.  Applies to: All patients receiving prescriptions (written or electronic).  Pharmacy of record: Pharmacy where electronic prescriptions will be sent. If written prescriptions are taken to a different pharmacy, please inform the nursing staff. The pharmacy listed in the electronic medical record should be the one where you would like electronic prescriptions to be sent.  Electronic prescriptions: In compliance with the [place] Strengthen Opioid Misuse Prevention (STOP) Act of 2017 (Session Law 2017-74/H243), effective November 05, 2018, all controlled substances must be electronically prescribed. Calling prescriptions to the pharmacy will cease to exist.  Prescription refills: Only during scheduled appointments. Applies to all prescriptions.  NOTE: The following applies primarily to controlled substances (Opioid* Pain Medications).   Patient's responsibilities: 1. Pain Pills: Bring all pain pills to every appointment (except for procedure appointments). 2. Pill Bottles: Bring pills in original pharmacy bottle. Always bring the newest bottle. Bring bottle, even if empty. 3. Medication refills: You are responsible for knowing and keeping track of what medications you take and those you need refilled. The day before your appointment: write a list of all prescriptions that need to be refilled. The day of the appointment: give the list to the admitting nurse. Prescriptions will be written only during appointments. No prescriptions will be written on procedure days. If you forget a medication: it will not be "Called in", "Faxed", or "electronically sent". You will need to get another appointment to get these prescribed. No early refills. Do not call asking to have your prescription filled  early. 4. Prescription Accuracy: You are responsible for carefully inspecting your prescriptions before leaving our office. Have the discharge nurse carefully go over each prescription with you, before taking them home. Make sure that your name is accurately spelled, that your address is correct. Check the name and dose of your medication to make sure it is accurate. Check the number of pills, and the written instructions to make sure they are clear and accurate. Make sure that you are given enough medication to last until your next medication refill appointment. 5. Taking Medication: Take medication as prescribed. When it comes to controlled substances, taking less pills or less frequently than prescribed is permitted and encouraged. Never take more pills than instructed. Never take medication more frequently than prescribed.  6. Inform other Doctors: Always inform, all of your healthcare providers, of all the medications you take. 7. Pain Medication from other Providers: You are not allowed to accept any additional pain medication from any other Doctor or Healthcare provider. There are two exceptions to this rule. (see below) In the event that you require additional pain medication, you are responsible for notifying us, as stated below. 8. Medication Agreement: You are responsible for carefully reading and following our Medication Agreement. This must be signed before receiving any prescriptions from our practice. Safely store a copy of your signed Agreement. Violations to the Agreement will result in no further prescriptions. (Additional copies of our Medication Agreement are available upon request.) 9. Laws, Rules, & Regulations: All patients are expected to follow all Federal and State Laws, Statutes, Rules, & Regulations. Ignorance of the Laws does not constitute a valid excuse. The use of any illegal substances is prohibited. 10. Adopted CDC guidelines & recommendations: Target dosing levels will be  at or below 60 MME/day. Use of benzodiazepines** is not recommended.  Exceptions: There are only two exceptions to the rule of not   receiving pain medications from other Healthcare Providers. 1. Exception #1 (Emergencies): In the event of an emergency (i.e.: accident requiring emergency care), you are allowed to receive additional pain medication. However, you are responsible for: As soon as you are able, call our office [phone], at any time of the day or night, and leave a message stating your name, the date and nature of the emergency, and the name and dose of the medication prescribed. In the event that your call is answered by a member of our staff, make sure to document and save the date, time, and the name of the person that took your information.  2. Exception #2 (Planned Surgery): In the event that you are scheduled by another doctor or dentist to have any type of surgery or procedure, you are allowed (for a period no longer than 30 days), to receive additional pain medication, for the acute post-op pain. However, in this case, you are responsible for picking up a copy of our "Post-op Pain Management for Surgeons" handout, and giving it to your surgeon or dentist. This document is available at our office, and does not require an appointment to obtain it. Simply go to our office during business hours (Monday-Thursday from 8:00 AM to 4:00 PM) (Friday 8:00 AM to 12:00 Noon) or if you have a scheduled appointment with us, prior to your surgery, and ask for it by name. In addition, you will need to provide us with your name, name of your surgeon, type of surgery, and date of procedure or surgery.  *Opioid medications include: morphine, codeine, oxycodone, oxymorphone, hydrocodone, hydromorphone, meperidine, tramadol, tapentadol, buprenorphine, fentanyl, methadone. **Benzodiazepine medications include: diazepam (Valium), alprazolam (Xanax), clonazepam (Klonopine), lorazepam (Ativan), clorazepate  (Tranxene), chlordiazepoxide (Librium), estazolam (Prosom), oxazepam (Serax), temazepam (Restoril), triazolam (Halcion) (Last updated: 01/02/2018) ____________________________________________________________________________________________   ____________________________________________________________________________________________  Medication Recommendations and Reminders  Applies to: All patients receiving prescriptions (written and/or electronic).  Medication Rules & Regulations: These rules and regulations exist for your safety and that of others. They are not flexible and neither are we. Dismissing or ignoring them will be considered "non-compliance" with medication therapy, resulting in complete and irreversible termination of such therapy. (See document titled "Medication Rules" for more details.) In all conscience, because of safety reasons, we cannot continue providing a therapy where the patient does not follow instructions.  Pharmacy of record:   Definition: This is the pharmacy where your electronic prescriptions will be sent.   We do not endorse any particular pharmacy.  You are not restricted in your choice of pharmacy.  The pharmacy listed in the electronic medical record should be the one where you want electronic prescriptions to be sent.  If you choose to change pharmacy, simply notify our nursing staff of your choice of new pharmacy.  Recommendations:  Keep all of your pain medications in a safe place, under lock and key, even if you live alone.   After you fill your prescription, take 1 week's worth of pills and put them away in a safe place. You should keep a separate, properly labeled bottle for this purpose. The remainder should be kept in the original bottle. Use this as your primary supply, until it runs out. Once it's gone, then you know that you have 1 week's worth of medicine, and it is time to come in for a prescription refill. If you do this correctly, it  is unlikely that you will ever run out of medicine.  To make sure that the above recommendation works,   it is very important that you make sure your medication refill appointments are scheduled at least 1 week before you run out of medicine. To do this in an effective manner, make sure that you do not leave the office without scheduling your next medication management appointment. Always ask the nursing staff to show you in your prescription , when your medication will be running out. Then arrange for the receptionist to get you a return appointment, at least 7 days before you run out of medicine. Do not wait until you have 1 or 2 pills left, to come in. This is very poor planning and does not take into consideration that we may need to cancel appointments due to bad weather, sickness, or emergencies affecting our staff.  "Partial Fill": If for any reason your pharmacy does not have enough pills/tablets to completely fill or refill your prescription, do not allow for a "partial fill". You will need a separate prescription to fill the remaining amount, which we will not provide. If the reason for the partial fill is your insurance, you will need to talk to the pharmacist about payment alternatives for the remaining tablets, but again, do not accept a partial fill.  Prescription refills and/or changes in medication(s):   Prescription refills, and/or changes in dose or medication, will be conducted only during scheduled medication management appointments. (Applies to both, written and electronic prescriptions.)  No refills on procedure days. No medication will be changed or started on procedure days. No changes, adjustments, and/or refills will be conducted on a procedure day. Doing so will interfere with the diagnostic portion of the procedure.  No phone refills. No medications will be "called into the pharmacy".  No Fax refills.  No weekend refills.  No Holliday refills.  No after hours  refills.  Remember:  Business hours are:  Monday to Thursday 8:00 AM to 4:00 PM Provider's Schedule: Crystal King, NP - Appointments are:  Medication management: Monday to Thursday 8:00 AM to 4:00 PM Chloeanne Poteet, MD - Appointments are:  Medication management: Monday and Wednesday 8:00 AM to 4:00 PM Procedure day: Tuesday and Thursday 7:30 AM to 4:00 PM Bilal Lateef, MD - Appointments are:  Medication management: Tuesday and Thursday 8:00 AM to 4:00 PM Procedure day: Monday and Wednesday 7:30 AM to 4:00 PM (Last update: 01/02/2018) ____________________________________________________________________________________________   ____________________________________________________________________________________________  CANNABIDIOL (AKA: CBD Oil or Pills)  Applies to: All patients receiving prescriptions of controlled substances (written and/or electronic).  General Information: Cannabidiol (CBD) was discovered in 1940. It is one of some 113 identified cannabinoids in cannabis (Marijuana) plants, accounting for up to 40% of the plant's extract. As of 2018, preliminary clinical research on cannabidiol included studies of anxiety, cognition, movement disorders, and pain.  Cannabidiol is consummed in multiple ways, including inhalation of cannabis smoke or vapor, as an aerosol spray into the cheek, and by mouth. It may be supplied as CBD oil containing CBD as the active ingredient (no added tetrahydrocannabinol (THC) or terpenes), a full-plant CBD-dominant hemp extract oil, capsules, dried cannabis, or as a liquid solution. CBD is thought not have the same psychoactivity as THC, and may affect the actions of THC. Studies suggest that CBD may interact with different biological targets, including cannabinoid receptors and other neurotransmitter receptors. As of 2018 the mechanism of action for its biological effects has not been determined.  In the [place], cannabidiol has a limited  approval by the Food and Drug Administration (FDA) for treatment of only two types   of epilepsy disorders. The side effects of long-term use of the drug include somnolence, decreased appetite, diarrhea, fatigue, malaise, weakness, sleeping problems, and others.  CBD remains a Schedule I drug prohibited for any use.  Legality: Some manufacturers ship CBD products nationally, an illegal action which the FDA has not enforced in 2018, with CBD remaining the subject of an FDA investigational new drug evaluation, and is not considered legal as a dietary supplement or food ingredient as of December 2018. Federal illegality has made it difficult historically to conduct research on CBD. CBD is openly sold in head shops and health food stores in some states where such sales have not been explicitly legalized.  Warning: Because it is not FDA approved for general use or treatment of pain, it is not required to undergo the same manufacturing controls as prescription drugs.  This means that the available cannabidiol (CBD) may be contaminated with THC.  If this is the case, it will trigger a positive urine drug screen (UDS) test for cannabinoids (Marijuana).  Because a positive UDS for illicit substances is a violation of our medication agreement, your opioid analgesics (pain medicine) may be permanently discontinued. (Last update: 01/23/2018) ____________________________________________________________________________________________    

## 2019-02-25 ENCOUNTER — Telehealth: Payer: Medicaid Other | Admitting: Pain Medicine

## 2019-03-11 ENCOUNTER — Other Ambulatory Visit: Payer: Self-pay | Admitting: Pain Medicine

## 2019-03-11 DIAGNOSIS — M792 Neuralgia and neuritis, unspecified: Secondary | ICD-10-CM

## 2019-03-13 ENCOUNTER — Other Ambulatory Visit
Admission: RE | Admit: 2019-03-13 | Discharge: 2019-03-13 | Disposition: A | Discharge status: Home / Self Care | Payer: Medicaid Other | Source: Ambulatory Visit | Attending: Ophthalmology | Admitting: Ophthalmology

## 2019-03-13 ENCOUNTER — Other Ambulatory Visit: Payer: Self-pay

## 2019-03-13 DIAGNOSIS — Z1159 Encounter for screening for other viral diseases: Secondary | ICD-10-CM | POA: Diagnosis present

## 2019-03-14 LAB — NOVEL CORONAVIRUS, NAA (HOSP ORDER, SEND-OUT TO REF LAB; TAT 18-24 HRS): SARS-CoV-2, NAA: NOT DETECTED

## 2019-03-16 ENCOUNTER — Encounter: Payer: Self-pay | Admitting: *Deleted

## 2019-03-16 ENCOUNTER — Other Ambulatory Visit: Payer: Self-pay

## 2019-03-17 NOTE — Discharge Instructions (Signed)

## 2019-03-18 ENCOUNTER — Ambulatory Visit
Admission: RE | Admit: 2019-03-18 | Discharge: 2019-03-18 | Disposition: A | Discharge status: Home / Self Care | Payer: Medicaid Other | Attending: Ophthalmology | Admitting: Ophthalmology

## 2019-03-18 ENCOUNTER — Ambulatory Visit: Payer: Medicaid Other | Admitting: Anesthesiology

## 2019-03-18 ENCOUNTER — Encounter
Admission: RE | Disposition: A | Discharge status: Home / Self Care | Payer: Self-pay | Source: Home / Self Care | Attending: Ophthalmology

## 2019-03-18 DIAGNOSIS — R569 Unspecified convulsions: Secondary | ICD-10-CM | POA: Insufficient documentation

## 2019-03-18 DIAGNOSIS — H2511 Age-related nuclear cataract, right eye: Secondary | ICD-10-CM | POA: Diagnosis present

## 2019-03-18 DIAGNOSIS — E119 Type 2 diabetes mellitus without complications: Secondary | ICD-10-CM | POA: Diagnosis not present

## 2019-03-18 DIAGNOSIS — Z87891 Personal history of nicotine dependence: Secondary | ICD-10-CM | POA: Insufficient documentation

## 2019-03-18 DIAGNOSIS — K219 Gastro-esophageal reflux disease without esophagitis: Secondary | ICD-10-CM | POA: Insufficient documentation

## 2019-03-18 DIAGNOSIS — G35 Multiple sclerosis: Secondary | ICD-10-CM | POA: Diagnosis not present

## 2019-03-18 HISTORY — DX: Anesthesia of skin: R20.0

## 2019-03-18 HISTORY — DX: Anesthesia of skin: R20.2

## 2019-03-18 HISTORY — PX: CATARACT EXTRACTION W/PHACO: SHX586

## 2019-03-18 HISTORY — DX: Gastro-esophageal reflux disease without esophagitis: K21.9

## 2019-03-18 HISTORY — DX: Unspecified osteoarthritis, unspecified site: M19.90

## 2019-03-18 HISTORY — DX: Headache, unspecified: R51.9

## 2019-03-18 HISTORY — DX: Presence of dental prosthetic device (complete) (partial): Z97.2

## 2019-03-18 HISTORY — DX: Dizziness and giddiness: R42

## 2019-03-18 LAB — GLUCOSE, CAPILLARY
Glucose-Capillary: 136 mg/dL — ABNORMAL HIGH (ref 70–99)
Glucose-Capillary: 99 mg/dL (ref 70–99)

## 2019-03-18 SURGERY — PHACOEMULSIFICATION, CATARACT, WITH IOL INSERTION
Anesthesia: Monitor Anesthesia Care | Site: Eye | Laterality: Right

## 2019-03-18 MED ORDER — FENTANYL CITRATE (PF) 100 MCG/2ML IJ SOLN
INTRAMUSCULAR | Status: DC | PRN
  Administered 2019-03-18: 50 ug via INTRAVENOUS

## 2019-03-18 MED ORDER — LIDOCAINE HCL (PF) 2 % IJ SOLN
INTRAOCULAR | Status: DC | PRN
  Administered 2019-03-18: 09:00:00 2 mL via INTRAOCULAR

## 2019-03-18 MED ORDER — EPINEPHRINE PF 1 MG/ML IJ SOLN
INTRAOCULAR | Status: DC | PRN
  Administered 2019-03-18: 55 mL via OPHTHALMIC

## 2019-03-18 MED ORDER — MOXIFLOXACIN HCL 0.5 % OP SOLN
OPHTHALMIC | Status: DC | PRN
  Administered 2019-03-18: 0.2 mL via OPHTHALMIC

## 2019-03-18 MED ORDER — MIDAZOLAM HCL 2 MG/2ML IJ SOLN
INTRAMUSCULAR | Status: DC | PRN
  Administered 2019-03-18: 2 mg via INTRAVENOUS

## 2019-03-18 MED ORDER — SODIUM HYALURONATE 10 MG/ML IO SOLN
INTRAOCULAR | Status: DC | PRN
  Administered 2019-03-18: 0.55 mL via INTRAOCULAR

## 2019-03-18 MED ORDER — ACETAMINOPHEN 160 MG/5ML PO SOLN: 325.0000 mg | Freq: Once | ORAL | Status: DC

## 2019-03-18 MED ORDER — SODIUM HYALURONATE 23 MG/ML IO SOLN
INTRAOCULAR | Status: DC | PRN
  Administered 2019-03-18: 0.6 mL via INTRAOCULAR

## 2019-03-18 MED ORDER — TETRACAINE HCL 0.5 % OP SOLN
1.0000 [drp] | OPHTHALMIC | Status: DC | PRN
  Administered 2019-03-18 (×3): 1 [drp] via OPHTHALMIC

## 2019-03-18 MED ORDER — ARMC OPHTHALMIC DILATING DROPS
1.0000 "application " | OPHTHALMIC | Status: DC | PRN
  Administered 2019-03-18 (×3): 1 via OPHTHALMIC

## 2019-03-18 MED ORDER — LACTATED RINGERS IV SOLN: INTRAVENOUS | Status: DC

## 2019-03-18 MED ORDER — ACETAMINOPHEN 325 MG PO TABS: 325.0000 mg | ORAL_TABLET | Freq: Once | ORAL | Status: DC

## 2019-03-18 SURGICAL SUPPLY — 18 items
CANNULA ANT/CHMB 27GA (MISCELLANEOUS) ×6 IMPLANT
DISSECTOR HYDRO NUCLEUS 50X22 (MISCELLANEOUS) ×3 IMPLANT
DRSG TEGADERM 4X4.75 (GAUZE/BANDAGES/DRESSINGS) ×3 IMPLANT
GLOVE PI ULTRA LF STRL 7.5 (GLOVE) ×1 IMPLANT
GLOVE PI ULTRA NON LATEX 7.5 (GLOVE) ×2
GLOVE SURG SYN 8.5  E (GLOVE) ×2
GLOVE SURG SYN 8.5 E (GLOVE) ×1 IMPLANT
GOWN STRL REUS W/ TWL LRG LVL3 (GOWN DISPOSABLE) ×2 IMPLANT
GOWN STRL REUS W/TWL LRG LVL3 (GOWN DISPOSABLE) ×4
LENS IOL TECNIS ITEC 25.0 (Intraocular Lens) ×3 IMPLANT
MARKER SKIN DUAL TIP RULER LAB (MISCELLANEOUS) ×3 IMPLANT
PACK DR. KING ARMS (PACKS) ×3 IMPLANT
PACK EYE AFTER SURG (MISCELLANEOUS) ×3 IMPLANT
PACK OPTHALMIC (MISCELLANEOUS) ×3 IMPLANT
SYR 3ML LL SCALE MARK (SYRINGE) ×3 IMPLANT
SYR TB 1ML LUER SLIP (SYRINGE) ×3 IMPLANT
WATER STERILE IRR 500ML POUR (IV SOLUTION) ×3 IMPLANT
WIPE NON LINTING 3.25X3.25 (MISCELLANEOUS) ×3 IMPLANT

## 2019-03-18 NOTE — Anesthesia Postprocedure Evaluation (Signed)
Anesthesia Post Note  Patient: Shannon Benitez  Procedure(s) Performed: CATARACT EXTRACTION PHACO AND INTRAOCULAR LENS PLACEMENT (IOC) RIGHT (Right Eye)  Patient location during evaluation: PACU Anesthesia Type: MAC Level of consciousness: awake and alert and oriented Pain management: satisfactory to patient Vital Signs Assessment: post-procedure vital signs reviewed and stable Respiratory status: spontaneous breathing, nonlabored ventilation and respiratory function stable Cardiovascular status: blood pressure returned to baseline and stable Postop Assessment: Adequate PO intake and No signs of nausea or vomiting Anesthetic complications: no    Raliegh Ip

## 2019-03-18 NOTE — Anesthesia Procedure Notes (Signed)
Procedure Name: MAC Date/Time: 03/18/2019 8:26 AM Performed by: Janna Arch, CRNA Pre-anesthesia Checklist: Patient identified, Emergency Drugs available, Suction available, Timeout performed and Patient being monitored Patient Re-evaluated:Patient Re-evaluated prior to induction Oxygen Delivery Method: Nasal cannula Placement Confirmation: positive ETCO2

## 2019-03-18 NOTE — H&P (Signed)

## 2019-03-18 NOTE — Transfer of Care (Signed)
Immediate Anesthesia Transfer of Care Note  Patient: Shannon Benitez  Procedure(s) Performed: CATARACT EXTRACTION PHACO AND INTRAOCULAR LENS PLACEMENT (IOC) RIGHT (Right Eye)  Patient Location: PACU  Anesthesia Type: MAC  Level of Consciousness: awake, alert  and patient cooperative  Airway and Oxygen Therapy: Patient Spontanous Breathing and Patient connected to supplemental oxygen  Post-op Assessment: Post-op Vital signs reviewed, Patient's Cardiovascular Status Stable, Respiratory Function Stable, Patent Airway and No signs of Nausea or vomiting  Post-op Vital Signs: Reviewed and stable  Complications: No apparent anesthesia complications

## 2019-03-18 NOTE — Anesthesia Preprocedure Evaluation (Addendum)
Anesthesia Evaluation  Patient identified by MRN, date of birth, ID band Patient awake    Reviewed: Allergy & Precautions, H&P , NPO status , Patient's Chart, lab work & pertinent test results  Airway Mallampati: III  TM Distance: >3 FB Neck ROM: full    Dental no notable dental hx.    Pulmonary former smoker,    Pulmonary exam normal breath sounds clear to auscultation       Cardiovascular Normal cardiovascular exam Rhythm:regular Rate:Normal     Neuro/Psych Seizures -,  PSYCHIATRIC DISORDERS MS  Neuromuscular disease    GI/Hepatic GERD  ,  Endo/Other  diabetes  Renal/GU      Musculoskeletal   Abdominal   Peds  Hematology   Anesthesia Other Findings   Reproductive/Obstetrics                             Anesthesia Physical  Anesthesia Plan  ASA: III  Anesthesia Plan: MAC   Post-op Pain Management:    Induction:   PONV Risk Score and Plan: 2 and Midazolam and Treatment may vary due to age or medical condition  Airway Management Planned:   Additional Equipment:   Intra-op Plan:   Post-operative Plan:   Informed Consent: I have reviewed the patients History and Physical, chart, labs and discussed the procedure including the risks, benefits and alternatives for the proposed anesthesia with the patient or authorized representative who has indicated his/her understanding and acceptance.       Plan Discussed with: CRNA  Anesthesia Plan Comments:         Anesthesia Quick Evaluation  

## 2019-03-18 NOTE — Op Note (Signed)
OPERATIVE NOTE  Shannon Benitez [id number] 03/18/2019   PREOPERATIVE DIAGNOSIS:  Nuclear sclerotic cataract right eye.  H25.11   POSTOPERATIVE DIAGNOSIS:    Nuclear sclerotic cataract right eye.     PROCEDURE:  Phacoemusification with posterior chamber intraocular lens placement of the right eye   LENS:   Implant Name Type Inv. Item Serial No. Manufacturer Lot No. LRB No. Used  LENS IOL DIOP 25.0 - [id number] Intraocular Lens LENS IOL DIOP 25.0 [id number]  Right 1       PCB00 +25.0   ULTRASOUND TIME: 0 minutes 7 seconds.  CDE 0.32   SURGEON:  Benay Pillow, MD, MPH  ANESTHESIOLOGIST: Anesthesiologist: Ronelle Nigh, MD CRNA: Janna Arch, CRNA   ANESTHESIA:  Topical with tetracaine drops augmented with 1% preservative-free intracameral lidocaine.  ESTIMATED BLOOD LOSS: less than 1 mL.   COMPLICATIONS:  None.   DESCRIPTION OF PROCEDURE:  The patient was identified in the holding room and transported to the operating room and placed in the supine position under the operating microscope.  The right eye was identified as the operative eye and it was prepped and draped in the usual sterile ophthalmic fashion.   A 1.0 millimeter clear-corneal paracentesis was made at the 10:30 position. 0.5 ml of preservative-free 1% lidocaine with epinephrine was injected into the anterior chamber.  The anterior chamber was filled with Healon 5 viscoelastic.  A 2.4 millimeter keratome was used to make a near-clear corneal incision at the 8:00 position.  A curvilinear capsulorrhexis was made with a cystotome and capsulorrhexis forceps.  Balanced salt solution was used to hydrodissect and hydrodelineate the nucleus.   Phacoemulsification was then used in stop and chop fashion to remove the lens nucleus and epinucleus.   The lens was relatively soft.  The remaining cortex was then removed using the irrigation and aspiration handpiece. Healon was then placed into the capsular bag to  distend it for lens placement.  A lens was then injected into the capsular bag.  The remaining viscoelastic was aspirated.   Wounds were hydrated with balanced salt solution.  The anterior chamber was inflated to a physiologic pressure with balanced salt solution.   Intracameral vigamox 0.1 mL undiluted was injected into the eye and a drop placed onto the ocular surface.  No wound leaks were noted.  The patient was taken to the recovery room in stable condition without complications of anesthesia or surgery  Benay Pillow 03/18/2019, 8:48 AM

## 2019-03-19 ENCOUNTER — Encounter: Payer: Self-pay | Admitting: Ophthalmology

## 2019-03-19 NOTE — OR Nursing (Signed)
Called patient to follow up after cataract extraction. Patient seemed confused and could not answer questions. Spoke with son. Son stated that this is not her baseline. Encouraged son to take mom to ED. Notified Dr. Edison Pace and Dr. Edison Pace stated that he would follow-up.

## 2019-03-24 ENCOUNTER — Encounter: Payer: Self-pay | Admitting: Pain Medicine

## 2019-03-24 ENCOUNTER — Other Ambulatory Visit: Payer: Self-pay | Admitting: Pain Medicine

## 2019-03-24 DIAGNOSIS — M47816 Spondylosis without myelopathy or radiculopathy, lumbar region: Secondary | ICD-10-CM

## 2019-03-25 ENCOUNTER — Other Ambulatory Visit: Payer: Self-pay

## 2019-03-25 ENCOUNTER — Ambulatory Visit: Payer: Medicaid Other | Attending: Pain Medicine | Admitting: Pain Medicine

## 2019-03-25 DIAGNOSIS — E1142 Type 2 diabetes mellitus with diabetic polyneuropathy: Secondary | ICD-10-CM

## 2019-03-25 DIAGNOSIS — G894 Chronic pain syndrome: Secondary | ICD-10-CM

## 2019-03-25 DIAGNOSIS — M47816 Spondylosis without myelopathy or radiculopathy, lumbar region: Secondary | ICD-10-CM

## 2019-03-25 DIAGNOSIS — M5137 Other intervertebral disc degeneration, lumbosacral region: Secondary | ICD-10-CM | POA: Diagnosis not present

## 2019-03-25 DIAGNOSIS — M792 Neuralgia and neuritis, unspecified: Secondary | ICD-10-CM

## 2019-03-25 DIAGNOSIS — G8929 Other chronic pain: Secondary | ICD-10-CM

## 2019-03-25 DIAGNOSIS — M5441 Lumbago with sciatica, right side: Secondary | ICD-10-CM

## 2019-03-25 DIAGNOSIS — M79605 Pain in left leg: Secondary | ICD-10-CM

## 2019-03-25 DIAGNOSIS — M5442 Lumbago with sciatica, left side: Secondary | ICD-10-CM

## 2019-03-25 DIAGNOSIS — G35 Multiple sclerosis: Secondary | ICD-10-CM

## 2019-03-25 DIAGNOSIS — M79604 Pain in right leg: Secondary | ICD-10-CM

## 2019-03-25 DIAGNOSIS — M62838 Other muscle spasm: Secondary | ICD-10-CM

## 2019-03-25 MED ORDER — MELOXICAM 15 MG PO TABS: 15.0000 mg | ORAL_TABLET | Freq: Every day | ORAL | 0 refills | Status: DC

## 2019-03-25 MED ORDER — PREGABALIN 75 MG PO CAPS: 75.0000 mg | ORAL_CAPSULE | Freq: Three times a day (TID) | ORAL | 0 refills | Status: DC

## 2019-03-25 MED ORDER — OXYCODONE HCL 5 MG PO TABS: 5.0000 mg | ORAL_TABLET | Freq: Two times a day (BID) | ORAL | 0 refills | Status: DC | PRN

## 2019-03-25 MED ORDER — BACLOFEN 20 MG PO TABS: 20.0000 mg | ORAL_TABLET | Freq: Four times a day (QID) | ORAL | 0 refills | Status: DC

## 2019-03-25 NOTE — Progress Notes (Signed)
Pain Management Virtual Encounter Note - Virtual Visit via Telephone Telehealth (real-time audio visits between healthcare provider and patient).  Patient's Phone No. & Preferred Pharmacy:  [phone] (home); [phone] (mobile); (Preferred) [phone] [email or web address]  [place] [place] [address] Phone: [phone] Fax: [phone]   Pre-screening note:  Our staff contacted Shannon Benitez and offered her an "in person", "face-to-face" appointment versus a telephone encounter. She indicated preferring the telephone encounter, at this time.  Reason for Virtual Visit: COVID-19*  Social distancing based on CDC and AMA recommendations.   I contacted Shannon Benitez on 03/25/2019 at 10:25 AM via telephone.      I clearly identified myself as [name], MD. I verified that I was speaking with the correct person using two identifiers (Name and date of birth: 10-22-1971).  Advanced Informed Consent I sought verbal advanced consent from [facility] for virtual visit interactions. I informed Shannon Benitez of possible security and privacy concerns, risks, and limitations associated with providing "not-in-person" medical evaluation and management services. I also informed Shannon Benitez of the availability of "in-person" appointments. Finally, I informed her that there would be a charge for the virtual visit and that she could be  personally, fully or partially, financially responsible for it. Shannon Benitez expressed understanding and agreed to proceed.   Historic Elements   Shannon Benitez is a 48 y.o. year old, female patient evaluated today after her last encounter by our practice on 03/24/2019. Shannon Benitez  has a past medical history of Diabetes mellitus without complication ([place]), GERD (gastroesophageal reflux disease), Headache, MS (multiple sclerosis) ([place]), Numbness and tingling of both legs below knees,  Osteoarthritis, Vertigo, and Wears dentures. She also  has a past surgical history that includes Abdominal hysterectomy; Cesarean section; Tumor removal (Left); and Cataract extraction w/PHACO (Right, 03/18/2019). Shannon Benitez has a current medication list which includes the following prescription(s): aspirin-acetaminophen-caffeine, baclofen, buspirone, duloxetine, lamotrigine, loperamide, meloxicam, metformin, mirabegron er, omeprazole, oxycodone, pregabalin, primidone, propranolol, and sitagliptin. She  reports that she quit smoking about 2 years ago. Her smoking use included cigarettes. She has a 9.00 pack-year smoking history. She has quit using smokeless tobacco. She reports that she does not drink alcohol. No history on file for drug. Shannon Benitez is allergic to glatiramer; rebif [interferon beta-1a]; and latex.   HPI  I last communicated with her on 03/24/2019. Today, she is being contacted for medication management.  Today I went over each of the patient's medications.  She seems to be doing well with all of them and describes no side effects or adverse reactions.  In terms of the gabapentin, she has stopped using it and she had no problems coming off of it as she was also taking the Lyrica.  Initially I prescribed the Lyrica 25 mg 3 times daily and what she ended up doing was taking all 3 tablets at a time, at bedtime.  She describes that taking all 3 tablets at once provided her with several hours of complete relief, but unfortunately did not last long.  She also described known side effects or adverse reactions to taking the medication in this matter.  In view of the fact that she was able to tolerate the 75 mg once, today I have given her a new prescription to start taking Lyrica 75 mg twice daily with instructions to then go to 3 times daily after 1 week, should she experience no side effects.  I  had the patient repeat this back to me, which she did without problems.  Next we talked about the  meloxicam and the oxycodone, both of which she takes and she is not having problems with.  She indicates that she has actually been able to occasionally stay away from taking the oxycodone and because of this, she indicates that she still has about 10 pills left.  Both, the meloxicam and the oxycodone we will keep the same.  The last medication that we spoke off was the baclofen 10 mg 4 times daily.  She indicates that the medication works wonders for her and she describes that if she does not take the medication then she will experience significant spasticity in her legs.  When I asked her how she was taking the medicine she indicated that she was taking 30 mg at bedtime and 20 mg during the day.  This is a total of 50 mg/day.  I reminded the patient that she could take up to 80 mg/day but I instructed her to take it as I was prescribing it.  She indicated that she had taken it like she described because of the fact that she had been taking it like that before.  I informed her that she needs to be careful about this type of pain and she needs to follow-up with directions and the bottle.  She mentioned that she was having difficulty reading the bottle, but as of last week she should not because of some cataract surgery that she had which now allows her to see better.  Once again, I had the patient repeat back to me the changes that were planning on making with the medications and she did, without any problems.  I will follow-up with her in approximately a month and if she is doing well, we will continue to increase the Lyrica, as tolerated, as long as she still feels that she is having some difficulties with the pain and is willing to try going higher.  At this point she was taking 75 mg/day and if she can tolerate the increase that were planning, she will be taking 225 mg/day.  The maximum dose on this medication is approximately 450 mg/day.  Pharmacotherapy Assessment  Analgesic: Oxycodone IR 5 mg 1 tablet p.o.  twice daily (10 mg/day of oxycodone) MME/day: 15 mg/day.   Monitoring: Pharmacotherapy: No side-effects or adverse reactions reported. [place] PMP: PDMP reviewed during this encounter.       Compliance: No problems identified. Effectiveness: Clinically acceptable. Plan: Refer to "POC".  Review of recent tests   Lab Review  Kidney Function Lab Results  Component Value Date   BUN 10 11/27/2018   CREATININE 0.79 11/27/2018   BCR 13 11/27/2018   GFRAA 102 11/27/2018   GFRNONAA 89 11/27/2018  Liver Function Lab Results  Component Value Date   AST 17 11/27/2018   ALT 35 08/18/2018   ALBUMIN 4.1 11/27/2018  Note: Above Lab results reviewed. DG C-Arm 1-60 Min-No Report Fluoroscopy was utilized by the requesting physician.  No radiographic  interpretation.    Admission on 03/18/2019, Discharged on 03/18/2019  Component Date Value Ref Range Status  . Glucose-Capillary 03/18/2019 136* 70 - 99 mg/dL Final  . Glucose-Capillary 03/18/2019 99  70 - 99 mg/dL Final   Assessment  The primary encounter diagnosis was Chronic pain syndrome. 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  Plan of Care  I have discontinued Cledith Pullman's Dimethyl Fumarate, gabapentin, and ergocalciferol. I have also changed her pregabalin and baclofen. Additionally, I am having her maintain her propranolol, omeprazole, sitaGLIPtin, metFORMIN, lamoTRIgine, DULoxetine, mirabegron ER, loperamide, aspirin-acetaminophen-caffeine, busPIRone, primidone, oxyCODONE, and meloxicam.  Pharmacotherapy (Medications Ordered): Meds ordered this  encounter  Medications  . oxyCODONE (OXY IR/ROXICODONE) 5 MG immediate release tablet    Sig: Take 1 tablet (5 mg total) by mouth 2 (two) times daily as needed for up to 30 days for severe pain. Must last 30 days.    Dispense:  60 tablet    Refill:  0    Chronic Pain. (STOP Act - Not applicable). Fill one day early if closed on scheduled refill date. Do not fill until: 03/25/19. To last until: 04/24/19.  Marland Kitchen pregabalin (LYRICA) 75 MG capsule    Sig: Take 1 capsule (75 mg total) by mouth 3 (three) times daily for 30 days.    Dispense:  90 capsule    Refill:  0    Do not place this medication, or any other prescription from our practice, on "Automatic Refill". Patient may have prescription filled one day early if pharmacy is closed on scheduled refill date.  . baclofen (LIORESAL) 20 MG tablet    Sig: Take 1 tablet (20 mg total) by mouth 4 (four) times daily for 30 days.    Dispense:  120 tablet    Refill:  0    Do not place medication on "Automatic Refill". Fill one day early if pharmacy is closed on scheduled refill date.  . meloxicam (MOBIC) 15 MG tablet    Sig: Take 1 tablet (15 mg total) by mouth daily for 30 days.    Dispense:  30 tablet    Refill:  0    Do not add this medication to the electronic "Automatic Refill" notification system. Patient may have prescription filled one day early if pharmacy is closed on scheduled refill date.   Orders:  No orders of the defined types were placed in this encounter.  Follow-up plan:   Return for (1 mo) Med-Mgmt, (Virtual Visit).   I discussed the assessment and treatment plan with the patient. The patient was provided an opportunity to ask questions and all were answered. The patient agreed with the plan and demonstrated an understanding of the instructions.  Patient advised to call back or seek an in-person evaluation if the symptoms or condition worsens.  Total duration of non-face-to-face encounter: 20 minutes.  Note by: [name], MD Date: 03/25/2019; Time: 10:25 AM  Disclaimer:  * Given the special circumstances of the COVID-19 pandemic, the federal government has announced that the Office for Civil Rights (OCR) will exercise its enforcement discretion and will not impose penalties on physicians using telehealth in the event of noncompliance with regulatory requirements under the [place] and Accountability Act (HIPAA) in connection with the good faith provision of telehealth during the HYQMV-78 national public health emergency. ([place])

## 2019-04-07 ENCOUNTER — Encounter: Payer: Self-pay | Admitting: *Deleted

## 2019-04-07 ENCOUNTER — Other Ambulatory Visit: Payer: Self-pay

## 2019-04-09 ENCOUNTER — Other Ambulatory Visit: Payer: Self-pay

## 2019-04-09 ENCOUNTER — Other Ambulatory Visit
Admission: RE | Admit: 2019-04-09 | Discharge: 2019-04-09 | Disposition: A | Discharge status: Home / Self Care | Payer: Medicaid Other | Source: Ambulatory Visit | Attending: Ophthalmology | Admitting: Ophthalmology

## 2019-04-09 ENCOUNTER — Other Ambulatory Visit: Payer: Self-pay | Admitting: Pain Medicine

## 2019-04-09 DIAGNOSIS — Z1159 Encounter for screening for other viral diseases: Secondary | ICD-10-CM | POA: Diagnosis present

## 2019-04-09 DIAGNOSIS — M62838 Other muscle spasm: Secondary | ICD-10-CM

## 2019-04-09 NOTE — Discharge Instructions (Signed)

## 2019-04-10 LAB — NOVEL CORONAVIRUS, NAA (HOSP ORDER, SEND-OUT TO REF LAB; TAT 18-24 HRS): SARS-CoV-2, NAA: NOT DETECTED

## 2019-04-13 ENCOUNTER — Ambulatory Visit
Admission: RE | Admit: 2019-04-13 | Discharge: 2019-04-13 | Disposition: A | Discharge status: Home / Self Care | Payer: Medicaid Other | Attending: Ophthalmology | Admitting: Ophthalmology

## 2019-04-13 ENCOUNTER — Other Ambulatory Visit: Payer: Self-pay

## 2019-04-13 ENCOUNTER — Encounter
Admission: RE | Disposition: A | Discharge status: Home / Self Care | Payer: Self-pay | Source: Home / Self Care | Attending: Ophthalmology

## 2019-04-13 ENCOUNTER — Ambulatory Visit: Payer: Medicaid Other | Admitting: Anesthesiology

## 2019-04-13 DIAGNOSIS — R569 Unspecified convulsions: Secondary | ICD-10-CM | POA: Insufficient documentation

## 2019-04-13 DIAGNOSIS — G35 Multiple sclerosis: Secondary | ICD-10-CM | POA: Insufficient documentation

## 2019-04-13 DIAGNOSIS — Z79899 Other long term (current) drug therapy: Secondary | ICD-10-CM | POA: Diagnosis not present

## 2019-04-13 DIAGNOSIS — Z7984 Long term (current) use of oral hypoglycemic drugs: Secondary | ICD-10-CM | POA: Insufficient documentation

## 2019-04-13 DIAGNOSIS — H2512 Age-related nuclear cataract, left eye: Secondary | ICD-10-CM | POA: Diagnosis present

## 2019-04-13 DIAGNOSIS — Z87891 Personal history of nicotine dependence: Secondary | ICD-10-CM | POA: Diagnosis not present

## 2019-04-13 DIAGNOSIS — Z791 Long term (current) use of non-steroidal anti-inflammatories (NSAID): Secondary | ICD-10-CM | POA: Diagnosis not present

## 2019-04-13 DIAGNOSIS — F329 Major depressive disorder, single episode, unspecified: Secondary | ICD-10-CM | POA: Insufficient documentation

## 2019-04-13 DIAGNOSIS — F419 Anxiety disorder, unspecified: Secondary | ICD-10-CM | POA: Diagnosis not present

## 2019-04-13 DIAGNOSIS — E1136 Type 2 diabetes mellitus with diabetic cataract: Secondary | ICD-10-CM | POA: Insufficient documentation

## 2019-04-13 DIAGNOSIS — K219 Gastro-esophageal reflux disease without esophagitis: Secondary | ICD-10-CM | POA: Insufficient documentation

## 2019-04-13 DIAGNOSIS — Z888 Allergy status to other drugs, medicaments and biological substances status: Secondary | ICD-10-CM | POA: Diagnosis not present

## 2019-04-13 HISTORY — PX: CATARACT EXTRACTION W/PHACO: SHX586

## 2019-04-13 LAB — GLUCOSE, CAPILLARY
Glucose-Capillary: 144 mg/dL — ABNORMAL HIGH (ref 70–99)
Glucose-Capillary: 113 mg/dL — ABNORMAL HIGH (ref 70–99)

## 2019-04-13 SURGERY — PHACOEMULSIFICATION, CATARACT, WITH IOL INSERTION
Anesthesia: Monitor Anesthesia Care | Site: Eye | Laterality: Left

## 2019-04-13 MED ORDER — MOXIFLOXACIN HCL 0.5 % OP SOLN
OPHTHALMIC | Status: DC | PRN
  Administered 2019-04-13: 0.2 mL via OPHTHALMIC

## 2019-04-13 MED ORDER — ONDANSETRON HCL 4 MG/2ML IJ SOLN: 4.0000 mg | Freq: Once | INTRAMUSCULAR | Status: DC | PRN

## 2019-04-13 MED ORDER — MIDAZOLAM HCL 2 MG/2ML IJ SOLN
INTRAMUSCULAR | Status: DC | PRN
  Administered 2019-04-13: 2 mg via INTRAVENOUS

## 2019-04-13 MED ORDER — TETRACAINE HCL 0.5 % OP SOLN
1.0000 [drp] | OPHTHALMIC | Status: DC | PRN
  Administered 2019-04-13 (×3): 1 [drp] via OPHTHALMIC

## 2019-04-13 MED ORDER — FENTANYL CITRATE (PF) 100 MCG/2ML IJ SOLN
INTRAMUSCULAR | Status: DC | PRN
  Administered 2019-04-13: 50 ug via INTRAVENOUS

## 2019-04-13 MED ORDER — LIDOCAINE HCL (PF) 2 % IJ SOLN
INTRAOCULAR | Status: DC | PRN
  Administered 2019-04-13: 1 mL via INTRAOCULAR

## 2019-04-13 MED ORDER — EPINEPHRINE PF 1 MG/ML IJ SOLN
INTRAOCULAR | Status: DC | PRN
  Administered 2019-04-13: 49 mL via OPHTHALMIC

## 2019-04-13 MED ORDER — SODIUM HYALURONATE 10 MG/ML IO SOLN
INTRAOCULAR | Status: DC | PRN
  Administered 2019-04-13: 0.55 mL via INTRAOCULAR

## 2019-04-13 MED ORDER — ARMC OPHTHALMIC DILATING DROPS
1.0000 "application " | OPHTHALMIC | Status: DC | PRN
  Administered 2019-04-13 (×3): 1 via OPHTHALMIC

## 2019-04-13 MED ORDER — LACTATED RINGERS IV SOLN: 10.0000 mL/h | INTRAVENOUS | Status: DC

## 2019-04-13 MED ORDER — SODIUM HYALURONATE 23 MG/ML IO SOLN
INTRAOCULAR | Status: DC | PRN
  Administered 2019-04-13: 0.6 mL via INTRAOCULAR

## 2019-04-13 SURGICAL SUPPLY — 16 items
CANNULA ANT/CHMB 27G (MISCELLANEOUS) ×2 IMPLANT
CANNULA ANT/CHMB 27GA (MISCELLANEOUS) ×6 IMPLANT
DISSECTOR HYDRO NUCLEUS 50X22 (MISCELLANEOUS) ×3 IMPLANT
GLOVE SKINSENSE NS SZ7.5 (GLOVE) ×6
GLOVE SKINSENSE STRL SZ7.5 (GLOVE) IMPLANT
GOWN STRL REUS W/ TWL LRG LVL3 (GOWN DISPOSABLE) ×2 IMPLANT
GOWN STRL REUS W/TWL LRG LVL3 (GOWN DISPOSABLE) ×4
LENS IOL TECNIS ITEC 26.0 (Intraocular Lens) ×2 IMPLANT
MARKER SKIN DUAL TIP RULER LAB (MISCELLANEOUS) ×3 IMPLANT
PACK DR. KING ARMS (PACKS) ×3 IMPLANT
PACK EYE AFTER SURG (MISCELLANEOUS) ×3 IMPLANT
PACK OPTHALMIC (MISCELLANEOUS) ×3 IMPLANT
SYR 3ML LL SCALE MARK (SYRINGE) ×3 IMPLANT
SYR TB 1ML LUER SLIP (SYRINGE) ×3 IMPLANT
WATER STERILE IRR 250ML POUR (IV SOLUTION) ×3 IMPLANT
WIPE NON LINTING 3.25X3.25 (MISCELLANEOUS) ×3 IMPLANT

## 2019-04-13 NOTE — Anesthesia Preprocedure Evaluation (Signed)
Anesthesia Evaluation  Patient identified by MRN, date of birth, ID band Patient awake    Reviewed: Allergy & Precautions, H&P , NPO status , Patient's Chart, lab work & pertinent test results  Airway Mallampati: III  TM Distance: >3 FB Neck ROM: full    Dental no notable dental hx.    Pulmonary former smoker,    Pulmonary exam normal breath sounds clear to auscultation       Cardiovascular Normal cardiovascular exam Rhythm:regular Rate:Normal     Neuro/Psych Seizures -,  PSYCHIATRIC DISORDERS MS  Neuromuscular disease    GI/Hepatic GERD  ,  Endo/Other  diabetes  Renal/GU      Musculoskeletal   Abdominal   Peds  Hematology   Anesthesia Other Findings   Reproductive/Obstetrics                             Anesthesia Physical  Anesthesia Plan  ASA: III  Anesthesia Plan: MAC   Post-op Pain Management:    Induction:   PONV Risk Score and Plan: 2 and Midazolam and Treatment may vary due to age or medical condition  Airway Management Planned:   Additional Equipment:   Intra-op Plan:   Post-operative Plan:   Informed Consent: I have reviewed the patients History and Physical, chart, labs and discussed the procedure including the risks, benefits and alternatives for the proposed anesthesia with the patient or authorized representative who has indicated his/her understanding and acceptance.       Plan Discussed with: CRNA  Anesthesia Plan Comments:         Anesthesia Quick Evaluation

## 2019-04-13 NOTE — Op Note (Signed)
OPERATIVE NOTE  Shannon Benitez [id number] 04/13/2019   PREOPERATIVE DIAGNOSIS:  Nuclear sclerotic cataract left eye.  H25.12   POSTOPERATIVE DIAGNOSIS:    Nuclear sclerotic cataract left eye.     PROCEDURE:  Phacoemusification with posterior chamber intraocular lens placement of the left eye   LENS:   Implant Name Type Inv. Item Serial No. Manufacturer Lot No. LRB No. Used  LENS IOL DIOP 26.0 - [id number] Intraocular Lens LENS IOL DIOP 26.0 [id number] AMO  Left 1       PCB00 +26.0   ULTRASOUND TIME: 0 minutes 15 seconds.  CDE 2.00   SURGEON:  Benay Pillow, MD, MPH   ANESTHESIA:  Topical with tetracaine drops augmented with 1% preservative-free intracameral lidocaine.  ESTIMATED BLOOD LOSS: <1 mL   COMPLICATIONS:  None.   DESCRIPTION OF PROCEDURE:  The patient was identified in the holding room and transported to the operating room and placed in the supine position under the operating microscope.  The left eye was identified as the operative eye and it was prepped and draped in the usual sterile ophthalmic fashion.   A 1.0 millimeter clear-corneal paracentesis was made at the 5:00 position. 0.5 ml of preservative-free 1% lidocaine with epinephrine was injected into the anterior chamber.  The anterior chamber was filled with Healon 5 viscoelastic.  A 2.4 millimeter keratome was used to make a near-clear corneal incision at the 2:00 position.  A curvilinear capsulorrhexis was made with a cystotome and capsulorrhexis forceps.  Balanced salt solution was used to hydrodissect and hydrodelineate the nucleus.   Phacoemulsification was then used in stop and chop fashion to remove the lens nucleus and epinucleus.  The remaining cortex was then removed using the irrigation and aspiration handpiece. Healon was then placed into the capsular bag to distend it for lens placement.  A lens was then injected into the capsular bag.  The remaining viscoelastic was aspirated.   Wounds were hydrated  with balanced salt solution.  The anterior chamber was inflated to a physiologic pressure with balanced salt solution.  Intracameral vigamox 0.1 mL undiltued was injected into the eye and a drop placed onto the ocular surface.  No wound leaks were noted.  The patient was taken to the recovery room in stable condition without complications of anesthesia or surgery  Benay Pillow 04/13/2019, 8:40 AM

## 2019-04-13 NOTE — H&P (Signed)

## 2019-04-13 NOTE — Transfer of Care (Signed)
Immediate Anesthesia Transfer of Care Note  Patient: Shannon Benitez  Procedure(s) Performed: CATARACT EXTRACTION PHACO AND INTRAOCULAR LENS PLACEMENT (IOC)  LEFT (Left Eye)  Patient Location: PACU  Anesthesia Type: MAC  Level of Consciousness: awake, alert  and patient cooperative  Airway and Oxygen Therapy: Patient Spontanous Breathing and Patient connected to supplemental oxygen  Post-op Assessment: Post-op Vital signs reviewed, Patient's Cardiovascular Status Stable, Respiratory Function Stable, Patent Airway and No signs of Nausea or vomiting  Post-op Vital Signs: Reviewed and stable  Complications: No apparent anesthesia complications

## 2019-04-13 NOTE — Anesthesia Postprocedure Evaluation (Signed)
Anesthesia Post Note  Patient: Shannon Benitez  Procedure(s) Performed: CATARACT EXTRACTION PHACO AND INTRAOCULAR LENS PLACEMENT (IOC)  LEFT (Left Eye)  Patient location during evaluation: PACU Anesthesia Type: MAC Level of consciousness: awake and alert Pain management: pain level controlled Vital Signs Assessment: post-procedure vital signs reviewed and stable Respiratory status: spontaneous breathing, nonlabored ventilation, respiratory function stable and patient connected to nasal cannula oxygen Cardiovascular status: stable and blood pressure returned to baseline Postop Assessment: no apparent nausea or vomiting Anesthetic complications: no    Denny Lave ELAINE

## 2019-04-13 NOTE — Anesthesia Procedure Notes (Signed)
Procedure Name: MAC Date/Time: 04/13/2019 8:22 AM Performed by: Cameron Ali, CRNA Pre-anesthesia Checklist: Patient identified, Emergency Drugs available, Suction available, Timeout performed and Patient being monitored Patient Re-evaluated:Patient Re-evaluated prior to induction Oxygen Delivery Method: Nasal cannula Placement Confirmation: positive ETCO2

## 2019-04-14 ENCOUNTER — Encounter: Payer: Self-pay | Admitting: Ophthalmology

## 2019-04-21 ENCOUNTER — Encounter: Payer: Self-pay | Admitting: Pain Medicine

## 2019-04-21 NOTE — Progress Notes (Signed)
Pain Management Virtual Encounter Note - Virtual Visit via Telephone Telehealth (real-time audio visits between healthcare provider and patient).   Patient's Phone No. & Preferred Pharmacy:  [phone] (home); [phone] (mobile); (Preferred) [phone] [email or web address]  [name], [name] Phone: [phone] Fax: [phone]    Pre-screening note:  Our staff contacted Shannon Benitez and offered her an "in person", "face-to-face" appointment versus a telephone encounter. She indicated preferring the telephone encounter, at this time.   Reason for Virtual Visit: COVID-19*  Social distancing based on CDC and AMA recommendations.   I contacted Briante Baruch on 04/22/2019 via telephone.      I clearly identified myself as [name], MD. I verified that I was speaking with the correct person using two identifiers (Name: Shannon Benitez, and date of birth: 1971/08/10).  Advanced Informed Consent I sought verbal advanced consent from [facility] for virtual visit interactions. I informed [name] of possible security and privacy concerns, risks, and limitations associated with providing "not-in-person" medical evaluation and management services. I also informed [name] of the availability of "in-person" appointments. Finally, I informed her that there would be a charge for the virtual visit and that she could be  personally, fully or partially, financially responsible for it. Ms. Bienaime expressed understanding and agreed to proceed.   Historic Elements   Shannon Benitez is a 48 y.o. year old, female patient evaluated today after her last encounter by our practice on 04/09/2019. Shannon Benitez  has a past medical history of Diabetes mellitus without complication ([place]), GERD (gastroesophageal reflux disease), Headache, MS (multiple sclerosis) ([place]), Numbness and tingling of both legs  below knees, Osteoarthritis, Vertigo, and Wears dentures. She also  has a past surgical history that includes Abdominal hysterectomy; Cesarean section; Tumor removal (Left); Cataract extraction w/PHACO (Right, 03/18/2019); and Cataract extraction w/PHACO (Left, 04/13/2019). Shannon Benitez has a current medication list which includes the following prescription(s): accu-chek softclix lancets, aspirin-acetaminophen-caffeine, baclofen, cholecalciferol, duloxetine, lamotrigine, loperamide, meloxicam, metformin, mirabegron er, omeprazole, oxycodone, pregabalin, primidone, propranolol, and sitagliptin. She  reports that she quit smoking about 2 years ago. Her smoking use included cigarettes. She has a 9.00 pack-year smoking history. She has quit using smokeless tobacco. She reports that she does not drink alcohol. No history on file for drug. Shannon Benitez is allergic to glatiramer; rebif [interferon beta-1a]; and latex.   HPI  Today, she is being contacted for medication management.  The patient seems to be doing well with her medication regimen and she describes not having any type of side effects or adverse reactions to any of the medicines that she has been taking.  The medicines seem to be controlling her pain, right up to the point where she tripped with a stump and fell on her "buttocks".  Since then her low back pain has worsened.  We had done facet blocks scopic guidance and IV sedation.  The patient did get pleat relief of the pain for the duration of the local anesthetic, but no long-term benefit.  Seem that she has type 2 diabetes and the use of steroids tends to trigger hyperglycemic episodes, I believe it is medically indicated for her to undergo radiofrequency ablation of the lumbar facets the purpose of extending the benefits seen with the time drastic injections.  Today I took the time to explain to the patient and her alternatives gluing the risk and possible complications.  The patient indicated being  interested in this option.  In addition, today we have continue with the upward titration of the Lyrica.  He tolerated this 75 mg p.o. 3 times daily well and therefore we have decided to go up to the 100 mg pills.  She indicated not having any side effects or adverse reactions to the medicine including no problems with somnolence, mental cloudiness, or memory problems.  Pharmacotherapy Assessment  Analgesic: Oxycodone IR 5 mg 1 tablet p.o. twice daily (10 mg/day of oxycodone) MME/day: 15 mg/day.  Monitoring: Pharmacotherapy: No side-effects or adverse reactions reported. [place] PMP: PDMP reviewed during this encounter.       Compliance: No problems identified. Effectiveness: Clinically acceptable. Plan: Refer to "POC".  Pertinent Labs   SAFETY SCREENING Profile Lab Results  Component Value Date   SARSCOV2NAA NOT DETECTED 04/09/2019   COVIDSOURCE NASOPHARYNGEAL 04/09/2019   Renal Function Lab Results  Component Value Date   BUN 10 11/27/2018   CREATININE 0.79 11/27/2018   BCR 13 11/27/2018   GFRAA 102 11/27/2018   GFRNONAA 89 11/27/2018   Hepatic Function Lab Results  Component Value Date   AST 17 11/27/2018   ALT 35 08/18/2018   ALBUMIN 4.1 11/27/2018   UDS Summary  Date Value Ref Range Status  11/27/2018 FINAL  Final    Comment:    ==================================================================== TOXASSURE COMP DRUG ANALYSIS,UR ==================================================================== Test                             Result       Flag       Units Drug Present and Declared for Prescription Verification   7-aminoclonazepam              319          EXPECTED   ng/mg creat    7-aminoclonazepam is an expected metabolite of clonazepam. Source    of clonazepam is a scheduled prescription medication.   Primidone                      PRESENT      EXPECTED   Phenobarbital                  PRESENT      EXPECTED    Phenobarbital is an expected metabolite of  primidone;    Phenobarbital may also be administered as a prescription drug.   Gabapentin                     PRESENT      EXPECTED   Lamotrigine                    PRESENT      EXPECTED   Baclofen                       PRESENT      EXPECTED   Duloxetine                     PRESENT      EXPECTED   Propranolol                    PRESENT      EXPECTED Drug Present not Declared for Prescription Verification   Metaxalone                     PRESENT  UNEXPECTED   Acetaminophen                  PRESENT      UNEXPECTED   Diphenhydramine                PRESENT      UNEXPECTED ==================================================================== Test                      Result    Flag   Units      Ref Range   Creatinine              47               mg/dL      >=20 ==================================================================== Declared Medications:  The flagging and interpretation on this report are based on the  following declared medications.  Unexpected results may arise from  inaccuracies in the declared medications.  **Note: The testing scope of this panel includes these medications:  Baclofen (Lioresal)  Clonazepam (Klonopin)  Duloxetine (Cymbalta)  Gabapentin (Neurontin)  Lamotrigine (Lamictal)  Primidone (Mysoline)  Propranolol (Inderal)  **Note: The testing scope of this panel does not include following  reported medications:  Dimethyl Fumarate  Metformin (Glucophage)  Mirabegron (Myrbetriq)  Omeprazole (Prilosec)  Sitagliptin (Januvia) ==================================================================== For clinical consultation, please call [phone]. ====================================================================    Note: Above Lab results reviewed.  Recent imaging  DG C-Arm 1-60 Min-No Report Fluoroscopy was utilized by the requesting physician.  No radiographic  interpretation.   Assessment  The primary encounter diagnosis was Chronic pain  syndrome. 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  Plan of Care  I have discontinued Farryn Gavel's busPIRone and gabapentin. I have also changed her pregabalin. Additionally, I am having her maintain her propranolol, omeprazole, lamoTRIgine, DULoxetine, mirabegron ER, loperamide, aspirin-acetaminophen-caffeine, primidone, oxyCODONE, baclofen, meloxicam, Cholecalciferol, Accu-Chek Softclix Lancets, metFORMIN, and sitaGLIPtin.  Pharmacotherapy (Medications Ordered): Meds ordered this encounter  Medications  . oxyCODONE (OXY IR/ROXICODONE) 5 MG immediate release tablet    Sig: Take 1 tablet (5 mg total) by mouth 2 (two) times daily as needed for up to 30 days for severe pain. Must last 30 days.    Dispense:  60 tablet    Refill:  0    Chronic Pain: STOP Act - Not applicable. Fill 1 day early if closed on scheduled refill date. Do not fill until: 04/24/2019. To last until: 05/24/2019. Instruct to avoid benzodiazepines within 8 hours of opioid.  . pregabalin (LYRICA) 100 MG capsule    Sig: Take 1 capsule (100 mg total) by mouth 3 (three) times daily for 30 days.    Dispense:  90 capsule    Refill:  0    Fill one day early if pharmacy is closed on scheduled refill date. May substitute for generic if available.  . baclofen (LIORESAL) 20 MG tablet    Sig: Take 1 tablet (20 mg total) by mouth 4 (four) times daily for 30 days.    Dispense:  120 tablet    Refill:  0    Fill one day early if pharmacy is closed on scheduled refill date. May substitute for generic if available.  . meloxicam (MOBIC) 15 MG tablet    Sig: Take  1 tablet (15 mg total) by mouth daily for 30 days.    Dispense:   30 tablet    Refill:  0    Fill one day early if pharmacy is closed on scheduled refill date. May substitute for generic if available.   Orders:  Orders Placed This Encounter  Procedures  . Radiofrequency,Lumbar    Standing Status:   Future    Standing Expiration Date:   10/21/2020    Scheduling Instructions:     Side(s): Left-sided     Level: L3-4, L4-5, & L5-S1 Facets (L2, L3, L4, L5, & S1 Medial Branch Nerves)     Sedation: With Sedation     Scheduling Timeframe: As soon as pre-approved    Order Specific Question:   Where will this procedure be performed?    Answer:   [facility]   Follow-up plan:   Return in about 1 month (around 05/22/2019) for (VV), E/M (MM), in addition, RFA: (L) L-FCT RFA #1, (ASAP).    I discussed the assessment and treatment plan with the patient. The patient was provided an opportunity to ask questions and all were answered. The patient agreed with the plan and demonstrated an understanding of the instructions.  Patient advised to call back or seek an in-person evaluation if the symptoms or condition worsens.  Total duration of non-face-to-face encounter: 15 minutes.  Note by: [name], MD Date: 04/22/2019; Time: 2:27 PM  Note: This dictation was prepared with Dragon dictation. Any transcriptional errors that may result from this process are unintentional.  Disclaimer:  * Given the special circumstances of the COVID-19 pandemic, the federal government has announced that the Office for Civil Rights (OCR) will exercise its enforcement discretion and will not impose penalties on physicians using telehealth in the event of noncompliance with regulatory requirements under the [place] (HIPAA) in connection with the good faith provision of telehealth during the WPVXY-80 national public health emergency. ([place])

## 2019-04-22 ENCOUNTER — Encounter: Payer: Self-pay | Admitting: Pain Medicine

## 2019-04-22 ENCOUNTER — Other Ambulatory Visit: Payer: Self-pay

## 2019-04-22 ENCOUNTER — Ambulatory Visit: Payer: Medicaid Other | Attending: Pain Medicine | Admitting: Pain Medicine

## 2019-04-22 DIAGNOSIS — M5442 Lumbago with sciatica, left side: Secondary | ICD-10-CM | POA: Diagnosis not present

## 2019-04-22 DIAGNOSIS — M79604 Pain in right leg: Secondary | ICD-10-CM | POA: Diagnosis not present

## 2019-04-22 DIAGNOSIS — M792 Neuralgia and neuritis, unspecified: Secondary | ICD-10-CM

## 2019-04-22 DIAGNOSIS — M47816 Spondylosis without myelopathy or radiculopathy, lumbar region: Secondary | ICD-10-CM

## 2019-04-22 DIAGNOSIS — G894 Chronic pain syndrome: Secondary | ICD-10-CM | POA: Diagnosis not present

## 2019-04-22 DIAGNOSIS — M79605 Pain in left leg: Secondary | ICD-10-CM

## 2019-04-22 DIAGNOSIS — M5441 Lumbago with sciatica, right side: Secondary | ICD-10-CM

## 2019-04-22 DIAGNOSIS — E1142 Type 2 diabetes mellitus with diabetic polyneuropathy: Secondary | ICD-10-CM

## 2019-04-22 DIAGNOSIS — M62838 Other muscle spasm: Secondary | ICD-10-CM

## 2019-04-22 DIAGNOSIS — G8929 Other chronic pain: Secondary | ICD-10-CM

## 2019-04-22 MED ORDER — OXYCODONE HCL 5 MG PO TABS: 5.0000 mg | ORAL_TABLET | Freq: Two times a day (BID) | ORAL | 0 refills | Status: DC | PRN

## 2019-04-22 MED ORDER — BACLOFEN 20 MG PO TABS: 20.0000 mg | ORAL_TABLET | Freq: Four times a day (QID) | ORAL | 0 refills | Status: DC

## 2019-04-22 MED ORDER — MELOXICAM 15 MG PO TABS: 15.0000 mg | ORAL_TABLET | Freq: Every day | ORAL | 0 refills | Status: DC

## 2019-04-22 MED ORDER — PREGABALIN 100 MG PO CAPS: 100.0000 mg | ORAL_CAPSULE | Freq: Three times a day (TID) | ORAL | 0 refills | Status: DC

## 2019-04-22 NOTE — Patient Instructions (Signed)

## 2019-04-27 ENCOUNTER — Other Ambulatory Visit
Admission: RE | Admit: 2019-04-27 | Discharge: 2019-04-27 | Disposition: A | Discharge status: Home / Self Care | Payer: Medicaid Other | Source: Ambulatory Visit | Attending: Pain Medicine | Admitting: Pain Medicine

## 2019-04-27 DIAGNOSIS — Z1159 Encounter for screening for other viral diseases: Secondary | ICD-10-CM | POA: Diagnosis not present

## 2019-04-29 LAB — NOVEL CORONAVIRUS, NAA (HOSP ORDER, SEND-OUT TO REF LAB; TAT 18-24 HRS): SARS-CoV-2, NAA: NOT DETECTED

## 2019-04-30 ENCOUNTER — Other Ambulatory Visit: Payer: Self-pay

## 2019-04-30 ENCOUNTER — Encounter: Payer: Self-pay | Admitting: Pain Medicine

## 2019-04-30 ENCOUNTER — Ambulatory Visit
Admission: RE | Admit: 2019-04-30 | Discharge: 2019-04-30 | Disposition: A | Discharge status: Home / Self Care | Payer: Medicaid Other | Source: Ambulatory Visit | Attending: Pain Medicine | Admitting: Pain Medicine

## 2019-04-30 ENCOUNTER — Ambulatory Visit (HOSPITAL_BASED_OUTPATIENT_CLINIC_OR_DEPARTMENT_OTHER): Payer: Medicaid Other | Admitting: Pain Medicine

## 2019-04-30 VITALS — BP 117/93 | HR 87 | Temp 97.5°F | Resp 16 | Ht 61.0 in | Wt 200.0 lb

## 2019-04-30 DIAGNOSIS — M545 Low back pain, unspecified: Secondary | ICD-10-CM

## 2019-04-30 DIAGNOSIS — G8918 Other acute postprocedural pain: Secondary | ICD-10-CM

## 2019-04-30 DIAGNOSIS — M47817 Spondylosis without myelopathy or radiculopathy, lumbosacral region: Secondary | ICD-10-CM

## 2019-04-30 DIAGNOSIS — M5137 Other intervertebral disc degeneration, lumbosacral region: Secondary | ICD-10-CM | POA: Insufficient documentation

## 2019-04-30 DIAGNOSIS — G8929 Other chronic pain: Secondary | ICD-10-CM | POA: Diagnosis present

## 2019-04-30 DIAGNOSIS — M47816 Spondylosis without myelopathy or radiculopathy, lumbar region: Secondary | ICD-10-CM | POA: Diagnosis present

## 2019-04-30 DIAGNOSIS — M51379 Other intervertebral disc degeneration, lumbosacral region without mention of lumbar back pain or lower extremity pain: Secondary | ICD-10-CM

## 2019-04-30 HISTORY — DX: Other acute postprocedural pain: G89.18

## 2019-04-30 MED ORDER — TRIAMCINOLONE ACETONIDE 40 MG/ML IJ SUSP
40.0000 mg | Freq: Once | INTRAMUSCULAR | Status: AC
  Administered 2019-04-30: 40 mg
  Filled 2019-04-30: qty 1

## 2019-04-30 MED ORDER — ROPIVACAINE HCL 2 MG/ML IJ SOLN
9.0000 mL | Freq: Once | INTRAMUSCULAR | Status: AC
  Administered 2019-04-30: 10 mL via PERINEURAL
  Filled 2019-04-30: qty 10

## 2019-04-30 MED ORDER — MIDAZOLAM HCL 5 MG/5ML IJ SOLN
1.0000 mg | INTRAMUSCULAR | Status: DC | PRN
  Administered 2019-04-30: 3 mg via INTRAVENOUS
  Filled 2019-04-30: qty 5

## 2019-04-30 MED ORDER — LACTATED RINGERS IV SOLN
1000.0000 mL | Freq: Once | INTRAVENOUS | Status: AC
  Administered 2019-04-30: 1000 mL via INTRAVENOUS

## 2019-04-30 MED ORDER — FENTANYL CITRATE (PF) 100 MCG/2ML IJ SOLN
25.0000 ug | INTRAMUSCULAR | Status: DC | PRN
  Administered 2019-04-30: 100 ug via INTRAVENOUS
  Filled 2019-04-30: qty 2

## 2019-04-30 MED ORDER — HYDROCODONE-ACETAMINOPHEN 5-325 MG PO TABS: 1.0000 | ORAL_TABLET | Freq: Four times a day (QID) | ORAL | 0 refills | Status: DC | PRN

## 2019-04-30 MED ORDER — LIDOCAINE HCL 2 % IJ SOLN
20.0000 mL | Freq: Once | INTRAMUSCULAR | Status: AC
  Administered 2019-04-30: 11:00:00 400 mg
  Filled 2019-04-30: qty 40

## 2019-04-30 NOTE — Patient Instructions (Signed)

## 2019-04-30 NOTE — Progress Notes (Signed)
Patient's Name: Shannon Benitez  MRN: [id number]  Referring Provider: Marygrace Drought, MD  DOB: 06-May-1971  PCP: Marygrace Drought, MD  DOS: 04/30/2019  Note by: [name], MD  Service setting: Ambulatory outpatient  Specialty: Interventional Pain Management  Patient type: Established  Location: [facility] (AMB) Pain [facility] Facility  Visit type: Interventional Procedure   Primary Reason for Visit: Interventional Pain Management Treatment. CC: Back Pain (lower)  Procedure:          Anesthesia, Analgesia, Anxiolysis:  Type: Thermal Lumbar Facet, Medial Branch Radiofrequency Ablation/Neurotomy  #1  Primary Purpose: Therapeutic Region: Posterolateral Lumbosacral Spine Level: L2, L3, L4, L5, & S1 Medial Branch Level(s). These levels will denervate the L3-4, L4-5, and the L5-S1 lumbar facet joints. Laterality: Left  Type: Moderate (Conscious) Sedation combined with Local Anesthesia Indication(s): Analgesia and Anxiety Route: Intravenous (IV) IV Access: Secured Sedation: Meaningful verbal contact was maintained at all times during the procedure  Local Anesthetic: Lidocaine 1-2%  Position: Prone   Indications: 1. Lumbar facet syndrome (Bilateral) (L>R)   2. Spondylosis without myelopathy or radiculopathy, lumbosacral region   3. Lumbar facet hypertrophy (Multilevel) (Bilateral)   4. DDD (degenerative disc disease), lumbosacral   5. Osteoarthritis of facet joint of lumbar spine   6. Lumbar spondylosis   7. Chronic low back pain (Bilateral) (L>R) w/o sciatica    Shannon Benitez has been dealing with the above chronic pain for longer than three months and has either failed to respond, was unable to tolerate, or simply did not get enough benefit from other more conservative therapies including, but not limited to: 1. Over-the-counter medications 2. Anti-inflammatory medications 3. Muscle relaxants 4. Membrane stabilizers 5. Opioids 6. Physical therapy and/or chiropractic  manipulation 7. Modalities (Heat, ice, etc.) 8. Invasive techniques such as nerve blocks. Ms. Danser has attained more than 50% relief of the pain from a series of diagnostic injections conducted in separate occasions.  Pain Score: Pre-procedure: 6 /10 Post-procedure: 0-No pain/10  Pharmacotherapy Assessment  Analgesic: Oxycodone IR 5 mg 1 tablet p.o. twice daily (10 mg/day of oxycodone) MME/day:15mg /day.  Monitoring: Pharmacotherapy: No side-effects or adverse reactions reported. [place] PMP: PDMP reviewed during this encounter.       Compliance: No problems identified. Effectiveness: Clinically acceptable. Plan: Refer to "POC".  Pre-op Assessment:  Shannon Benitez is a 48 y.o. (year old), female patient, seen today for interventional treatment. She  has a past surgical history that includes Abdominal hysterectomy; Cesarean section; Tumor removal (Left); Cataract extraction w/PHACO (Right, 03/18/2019); and Cataract extraction w/PHACO (Left, 04/13/2019). Shannon Benitez has a current medication list which includes the following prescription(s): accu-chek softclix lancets, aspirin-acetaminophen-caffeine, baclofen, cholecalciferol, duloxetine, lamotrigine, loperamide, meloxicam, metformin, mirabegron er, omeprazole, oxycodone, pregabalin, primidone, propranolol, sitagliptin, hydrocodone-acetaminophen, and hydrocodone-acetaminophen, and the following Facility-Administered Medications: fentanyl and midazolam. Her primarily concern today is the Back Pain (lower)  Initial Vital Signs:  Pulse/HCG Rate: 87ECG Heart Rate: 79 Temp: 98.4 F (36.9 C) Resp: 18 BP: (!) 138/92 SpO2: 98 %  BMI: Estimated body mass index is 37.79 kg/m as calculated from the following:   Height as of this encounter: 5\' 1"  (1.549 m).   Weight as of this encounter: 200 lb (90.7 kg).  Risk Assessment: Allergies: Reviewed. She is allergic to glatiramer; rebif [interferon beta-1a]; and latex.  Allergy Precautions: None  required Coagulopathies: Reviewed. None identified.  Blood-thinner therapy: None at this time Active Infection(s): Reviewed. None identified. Shannon Benitez is afebrile  Site Confirmation: Shannon Benitez was asked to confirm the procedure and laterality  before marking the site Procedure checklist: Completed Consent: Before the procedure and under the influence of no sedative(s), amnesic(s), or anxiolytics, the patient was informed of the treatment options, risks and possible complications. To fulfill our ethical and legal obligations, as recommended by the American Medical Association's Code of Ethics, I have informed the patient of my clinical impression; the nature and purpose of the treatment or procedure; the risks, benefits, and possible complications of the intervention; the alternatives, including doing nothing; the risk(s) and benefit(s) of the alternative treatment(s) or procedure(s); and the risk(s) and benefit(s) of doing nothing. The patient was provided information about the general risks and possible complications associated with the procedure. These may include, but are not limited to: failure to achieve desired goals, infection, bleeding, organ or nerve damage, allergic reactions, paralysis, and death. In addition, the patient was informed of those risks and complications associated to Spine-related procedures, such as failure to decrease pain; infection (i.e.: Meningitis, epidural or intraspinal abscess); bleeding (i.e.: epidural hematoma, subarachnoid hemorrhage, or any other type of intraspinal or peri-dural bleeding); organ or nerve damage (i.e.: Any type of peripheral nerve, nerve root, or spinal cord injury) with subsequent damage to sensory, motor, and/or autonomic systems, resulting in permanent pain, numbness, and/or weakness of one or several areas of the body; allergic reactions; (i.e.: anaphylactic reaction); and/or death. Furthermore, the patient was informed of those risks  and complications associated with the medications. These include, but are not limited to: allergic reactions (i.e.: anaphylactic or anaphylactoid reaction(s)); adrenal axis suppression; blood sugar elevation that in diabetics may result in ketoacidosis or comma; water retention that in patients with history of congestive heart failure may result in shortness of breath, pulmonary edema, and decompensation with resultant heart failure; weight gain; swelling or edema; medication-induced neural toxicity; particulate matter embolism and blood vessel occlusion with resultant organ, and/or nervous system infarction; and/or aseptic necrosis of one or more joints. Finally, the patient was informed that Medicine is not an exact science; therefore, there is also the possibility of unforeseen or unpredictable risks and/or possible complications that may result in a catastrophic outcome. The patient indicated having understood very clearly. We have given the patient no guarantees and we have made no promises. Enough time was given to the patient to ask questions, all of which were answered to the patient's satisfaction. Ms. Mcauley has indicated that she wanted to continue with the procedure. Attestation: I, the ordering provider, attest that I have discussed with the patient the benefits, risks, side-effects, alternatives, likelihood of achieving goals, and potential problems during recovery for the procedure that I have provided informed consent. Date   Time: 04/30/2019 10:39 AM  Pre-Procedure Preparation:  Monitoring: As per clinic protocol. Respiration, ETCO2, SpO2, BP, heart rate and rhythm monitor placed and checked for adequate function Safety Precautions: Patient was assessed for positional comfort and pressure points before starting the procedure. Time-out: I initiated and conducted the "Time-out" before starting the procedure, as per protocol. The patient was asked to participate by confirming the accuracy  of the "Time Out" information. Verification of the correct person, site, and procedure were performed and confirmed by me, the nursing staff, and the patient. "Time-out" conducted as per Joint Commission's Universal Protocol (UP.01.01.01). Time: 1112  Description of Procedure:          Laterality: Left Levels:  L2, L3, L4, L5, & S1 Medial Branch Level(s), at the L3-4, L4-5, and the L5-S1 lumbar facet joints. Area Prepped: Lumbosacral Prepping solution: DuraPrep (Iodine Povacrylex [  0.7% available iodine] and Isopropyl Alcohol, 74% w/w) Safety Precautions: Aspiration looking for blood return was conducted prior to all injections. At no point did we inject any substances, as a needle was being advanced. Before injecting, the patient was told to immediately notify me if she was experiencing any new onset of "ringing in the ears, or metallic taste in the mouth". No attempts were made at seeking any paresthesias. Safe injection practices and needle disposal techniques used. Medications properly checked for expiration dates. SDV (single dose vial) medications used. After the completion of the procedure, all disposable equipment used was discarded in the proper designated medical waste containers. Local Anesthesia: Protocol guidelines were followed. The patient was positioned over the fluoroscopy table. The area was prepped in the usual manner. The time-out was completed. The target area was identified using fluoroscopy. A 12-in long, straight, sterile hemostat was used with fluoroscopic guidance to locate the targets for each level blocked. Once located, the skin was marked with an approved surgical skin marker. Once all sites were marked, the skin (epidermis, dermis, and hypodermis), as well as deeper tissues (fat, connective tissue and muscle) were infiltrated with a small amount of a short-acting local anesthetic, loaded on a 10cc syringe with a 25G, 1.5-in  Needle. An appropriate amount of time was allowed for  local anesthetics to take effect before proceeding to the next step. Local Anesthetic: Lidocaine 2.0% The unused portion of the local anesthetic was discarded in the proper designated containers. Technical explanation of process:  Radiofrequency Ablation (RFA) L2 Medial Branch Nerve RFA: The target area for the L2 medial branch is at the junction of the postero-lateral aspect of the superior articular process and the superior, posterior, and medial edge of the transverse process of L3. Under fluoroscopic guidance, a Radiofrequency needle was inserted until contact was made with os over the superior postero-lateral aspect of the pedicular shadow (target area). Sensory and motor testing was conducted to properly adjust the position of the needle. Once satisfactory placement of the needle was achieved, the numbing solution was slowly injected after negative aspiration for blood. 2.0 mL of the nerve block solution was injected without difficulty or complication. After waiting for at least 3 minutes, the ablation was performed. Once completed, the needle was removed intact. L3 Medial Branch Nerve RFA: The target area for the L3 medial branch is at the junction of the postero-lateral aspect of the superior articular process and the superior, posterior, and medial edge of the transverse process of L4. Under fluoroscopic guidance, a Radiofrequency needle was inserted until contact was made with os over the superior postero-lateral aspect of the pedicular shadow (target area). Sensory and motor testing was conducted to properly adjust the position of the needle. Once satisfactory placement of the needle was achieved, the numbing solution was slowly injected after negative aspiration for blood. 2.0 mL of the nerve block solution was injected without difficulty or complication. After waiting for at least 3 minutes, the ablation was performed. Once completed, the needle was removed intact. L4 Medial Branch Nerve RFA: The  target area for the L4 medial branch is at the junction of the postero-lateral aspect of the superior articular process and the superior, posterior, and medial edge of the transverse process of L5. Under fluoroscopic guidance, a Radiofrequency needle was inserted until contact was made with os over the superior postero-lateral aspect of the pedicular shadow (target area). Sensory and motor testing was conducted to properly adjust the position of the needle. Once satisfactory  placement of the needle was achieved, the numbing solution was slowly injected after negative aspiration for blood. 2.0 mL of the nerve block solution was injected without difficulty or complication. After waiting for at least 3 minutes, the ablation was performed. Once completed, the needle was removed intact. L5 Medial Branch Nerve RFA: The target area for the L5 medial branch is at the junction of the postero-lateral aspect of the superior articular process of S1 and the superior, posterior, and medial edge of the sacral ala. Under fluoroscopic guidance, a Radiofrequency needle was inserted until contact was made with os over the superior postero-lateral aspect of the pedicular shadow (target area). Sensory and motor testing was conducted to properly adjust the position of the needle. Once satisfactory placement of the needle was achieved, the numbing solution was slowly injected after negative aspiration for blood. 2.0 mL of the nerve block solution was injected without difficulty or complication. After waiting for at least 3 minutes, the ablation was performed. Once completed, the needle was removed intact. S1 Medial Branch Nerve RFA: The target area for the S1 medial branch is located inferior to the junction of the S1 superior articular process and the L5 inferior articular process, posterior, inferior, and lateral to the 6 o'clock position of the L5-S1 facet joint, just superior to the S1 posterior foramen. Under fluoroscopic guidance,  the Radiofrequency needle was advanced until contact was made with os over the Target area. Sensory and motor testing was conducted to properly adjust the position of the needle. Once satisfactory placement of the needle was achieved, the numbing solution was slowly injected after negative aspiration for blood. 2.0 mL of the nerve block solution was injected without difficulty or complication. After waiting for at least 3 minutes, the ablation was performed. Once completed, the needle was removed intact. Radiofrequency lesioning (ablation):  Radiofrequency Generator: NeuroTherm [id number] Sensory Stimulation Parameters: 50 Hz was used to locate & identify the nerve, making sure that the needle was positioned such that there was no sensory stimulation below 0.3 V or above 0.7 V. Motor Stimulation Parameters: 2 Hz was used to evaluate the motor component. Care was taken not to lesion any nerves that demonstrated motor stimulation of the lower extremities at an output of less than 2.5 times that of the sensory threshold, or a maximum of 2.0 V. Lesioning Technique Parameters: Standard Radiofrequency settings. (Not bipolar or pulsed.) Temperature Settings: 80 degrees C Lesioning time: 60 seconds Intra-operative Compliance: Compliant Materials & Medications: Needle(s) (Electrode/Cannula) Type: Teflon-coated, curved tip, Radiofrequency needle(s) Gauge: 22G Length: 10cm Numbing solution: 0.2% PF-Ropivacaine + Triamcinolone (40 mg/mL) diluted to a final concentration of 4 mg of Triamcinolone/mL of Ropivacaine The unused portion of the solution was discarded in the proper designated containers.  Once the entire procedure was completed, the treated area was cleaned, making sure to leave some of the prepping solution back to take advantage of its long term bactericidal properties.  Illustration of the posterior view of the lumbar spine and the posterior neural structures. Laminae of L2 through S1 are labeled.  DPRL5, dorsal primary ramus of L5; DPRS1, dorsal primary ramus of S1; DPR3, dorsal primary ramus of L3; FJ, facet (zygapophyseal) joint L3-L4; I, inferior articular process of L4; LB1, lateral branch of dorsal primary ramus of L1; IAB, inferior articular branches from L3 medial branch (supplies L4-L5 facet joint); IBP, intermediate branch plexus; MB3, medial branch of dorsal primary ramus of L3; NR3, third lumbar nerve root; S, superior articular process of L5; SAB, superior  articular branches from L4 (supplies L4-5 facet joint also); TP3, transverse process of L3.  Vitals:   04/30/19 1146 04/30/19 1156 04/30/19 1206 04/30/19 1216  BP: 123/88 (!) 107/93 110/90 (!) 117/93  Pulse:      Resp: 13 17 15 16   Temp:  (!) 97.3 F (36.3 C)  (!) 97.5 F (36.4 C)  TempSrc:      SpO2: 95% 99% 100% 100%  Weight:      Height:        Start Time: 1112 hrs. End Time: 1146 hrs.  Imaging Guidance (Spinal):          Type of Imaging Technique: Fluoroscopy Guidance (Spinal) Indication(s): Assistance in needle guidance and placement for procedures requiring needle placement in or near specific anatomical locations not easily accessible without such assistance. Exposure Time: Please see nurses notes. Contrast: None used. Fluoroscopic Guidance: I was personally present during the use of fluoroscopy. "Tunnel Vision Technique" used to obtain the best possible view of the target area. Parallax error corrected before commencing the procedure. "Direction-depth-direction" technique used to introduce the needle under continuous pulsed fluoroscopy. Once target was reached, antero-posterior, oblique, and lateral fluoroscopic projection used confirm needle placement in all planes. Images permanently stored in EMR. Interpretation: No contrast injected. I personally interpreted the imaging intraoperatively. Adequate needle placement confirmed in multiple planes. Permanent images saved into the patient's record.  Antibiotic  Prophylaxis:   Anti-infectives (From admission, onward)   None     Indication(s): None identified  Post-operative Assessment:  Post-procedure Vital Signs:  Pulse/HCG Rate: 8780 Temp: (!) 97.5 F (36.4 C) Resp: 16 BP: (!) 117/93 SpO2: 100 %  EBL: None  Complications: No immediate post-treatment complications observed by team, or reported by patient.  Note: The patient tolerated the entire procedure well. A repeat set of vitals were taken after the procedure and the patient was kept under observation following institutional policy, for this type of procedure. Post-procedural neurological assessment was performed, showing return to baseline, prior to discharge. The patient was provided with post-procedure discharge instructions, including a section on how to identify potential problems. Should any problems arise concerning this procedure, the patient was given instructions to immediately contact us, at any time, without hesitation. In any case, we plan to contact the patient by telephone for a follow-up status report regarding this interventional procedure.  Comments:  No additional relevant information.  Plan of Care  Orders:  Orders Placed This Encounter  Procedures   Radiofrequency,Lumbar    Scheduling Instructions:     Side(s): Left-sided     Level: L3-4, L4-5, & L5-S1 Facets (L2, L3, L4, L5, & S1 Medial Branch Nerves)     Sedation: With Sedation     Timeframe: Today    Order Specific Question:   Where will this procedure be performed?    Answer:   [facility]   Radiofrequency,Lumbar    Standing Status:   Future    Standing Expiration Date:   10/29/2020    Scheduling Instructions:     Side(s): Right-sided     Level: L3-4, L4-5, & L5-S1 Facets (L2, L3, L4, L5, & S1 Medial Branch Nerves)     Sedation: With Sedation     Scheduling Timeframe: 2 weeks from now    Order Specific Question:   Where will this procedure be performed?    Answer:   [facility]     DG PAIN CLINIC C-ARM 1-60 MIN NO REPORT    Intraoperative interpretation by procedural  physician at [facility].    Standing Status:   Standing    Number of Occurrences:   1    Order Specific Question:   Reason for exam:    Answer:   Assistance in needle guidance and placement for procedures requiring needle placement in or near specific anatomical locations not easily accessible without such assistance.   Provider attestation of informed consent for procedure/surgical case    I, the ordering provider, attest that I have discussed with the patient the benefits, risks, side effects, alternatives, likelihood of achieving goals and potential problems during recovery for the procedure that I have provided informed consent.    Standing Status:   Standing    Number of Occurrences:   1   Informed Consent Details: Transcribe to consent form and obtain patient signature    Consent Attestation: I, the ordering provider, attest that I have discussed with the patient the benefits, risks, side-effects, alternatives, likelihood of achieving goals, and potential problems during recovery for the procedure that I have provided informed consent.    Standing Status:   Standing    Number of Occurrences:   1    Order Specific Question:   Procedure    Answer:   Left Lumbar facet medial branch RFA under fluoroscopic guidance.    Order Specific Question:   Surgeon    Answer:   Kathlen Brunswick. Dossie Arbour, MD    Order Specific Question:   Indication/Reason    Answer:   Chronic low back pain secondary to lumbar facet syndrome   Medications ordered for procedure: Meds ordered this encounter  Medications   lidocaine (XYLOCAINE) 2 % (with pres) injection 400 mg   lactated ringers infusion 1,000 mL   midazolam (VERSED) 5 MG/5ML injection 1-2 mg    Make sure Flumazenil is available in the pyxis when using this medication. If oversedation occurs, administer 0.2 mg IV over 15 sec. If after 45 sec no  response, administer 0.2 mg again over 1 min; may repeat at 1 min intervals; not to exceed 4 doses (1 mg)   fentaNYL (SUBLIMAZE) injection 25-50 mcg    Make sure Narcan is available in the pyxis when using this medication. In the event of respiratory depression (RR< 8/min): Titrate NARCAN (naloxone) in increments of 0.1 to 0.2 mg IV at 2-3 minute intervals, until desired degree of reversal.   ropivacaine (PF) 2 mg/mL (0.2%) (NAROPIN) injection 9 mL   triamcinolone acetonide (KENALOG-40) injection 40 mg   HYDROcodone-acetaminophen (NORCO/VICODIN) 5-325 MG tablet    Sig: Take 1 tablet by mouth every 6 (six) hours as needed for up to 7 days for severe pain. Must last 7 days.    Dispense:  28 tablet    Refill:  0    For acute post-operative pain. Not to be refilled. Must last 7 days.   HYDROcodone-acetaminophen (NORCO/VICODIN) 5-325 MG tablet    Sig: Take 1 tablet by mouth every 6 (six) hours as needed for up to 7 days for severe pain. Must last 7 days.    Dispense:  28 tablet    Refill:  0    For acute post-operative pain. Not to be refilled.  Must last 7 days.   Medications administered: We administered lidocaine, lactated ringers, midazolam, fentaNYL, ropivacaine (PF) 2 mg/mL (0.2%), and triamcinolone acetonide.  See the medical record for exact dosing, route, and time of administration.  Disposition: Discharge home  Discharge Date & Time: 04/30/2019; 1217 hrs.   Follow-up plan:  Return for scheduled appointment, in addition, RFA: (R) L-FCT #1 in 2 wks..     Recent Visits Date Type Provider Dept  04/22/19 Office Visit Milinda Pointer, MD [facility]  03/25/19 Office Visit Milinda Pointer, MD [facility]  02/23/19 Office Visit Milinda Pointer, MD [facility]  02/10/19 Procedure visit Milinda Pointer, MD [facility]  Showing recent visits within past 90 days and meeting all other requirements   Today's Visits Date Type Provider  Dept  04/30/19 Procedure visit Milinda Pointer, MD [facility]  Showing today's visits and meeting all other requirements   Future Appointments Date Type Provider Dept  05/14/19 Appointment Milinda Pointer, MD [facility]  05/20/19 Appointment Milinda Pointer, MD [facility]  Showing future appointments within next 90 days and meeting all other requirements   Primary Care Physician: Marygrace Drought, MD Location: [facility] Note by: [name], MD Date: 04/30/2019; Time: 12:20 PM  Disclaimer:  Medicine is not an Chief Strategy Officer. The only guarantee in medicine is that nothing is guaranteed. It is important to note that the decision to proceed with this intervention was based on the information collected from the patient. The Data and conclusions were drawn from the patient's questionnaire, the interview, and the physical examination. Because the information was provided in large part by the patient, it cannot be guaranteed that it has not been purposely or unconsciously manipulated. Every effort has been made to obtain as much relevant data as possible for this evaluation. It is important to note that the conclusions that lead to this procedure are derived in large part from the available data. Always take into account that the treatment will also be dependent on availability of resources and existing treatment guidelines, considered by other Pain Management Practitioners as being common knowledge and practice, at the time of the intervention. For Medico-Legal purposes, it is also important to point out that variation in procedural techniques and pharmacological choices are the acceptable norm. The indications, contraindications, technique, and results of the above procedure should only be interpreted and judged by a Board-Certified Interventional Pain Specialist with extensive familiarity and expertise in the same exact  procedure and technique.

## 2019-04-30 NOTE — Progress Notes (Signed)
Safety precautions to be maintained throughout the outpatient stay will include: orient to surroundings, keep bed in low position, maintain call bell within reach at all times, provide assistance with transfer out of bed and ambulation.  

## 2019-05-01 ENCOUNTER — Telehealth: Payer: Self-pay

## 2019-05-01 NOTE — Telephone Encounter (Signed)
Denies any needs at this time. Instructed to call if needed. 

## 2019-05-14 ENCOUNTER — Ambulatory Visit: Payer: Medicaid Other | Admitting: Pain Medicine

## 2019-05-18 ENCOUNTER — Other Ambulatory Visit: Payer: Self-pay

## 2019-05-18 ENCOUNTER — Other Ambulatory Visit
Admission: RE | Admit: 2019-05-18 | Discharge: 2019-05-18 | Disposition: A | Discharge status: Home / Self Care | Payer: Medicaid Other | Source: Ambulatory Visit | Attending: Pain Medicine | Admitting: Pain Medicine

## 2019-05-18 DIAGNOSIS — Z1159 Encounter for screening for other viral diseases: Secondary | ICD-10-CM | POA: Insufficient documentation

## 2019-05-19 ENCOUNTER — Encounter: Payer: Self-pay | Admitting: Pain Medicine

## 2019-05-19 LAB — SARS CORONAVIRUS 2 (TAT 6-24 HRS): SARS Coronavirus 2: NEGATIVE

## 2019-05-19 NOTE — Progress Notes (Addendum)
Pain Management Virtual Encounter Note - Virtual Visit via Telephone Telehealth (real-time audio visits between healthcare provider and patient).   Patient's Phone No. & Preferred Pharmacy:  [phone] (home); [phone] (mobile); (Preferred) [phone] [email or web address]  [place], [address] [place] [address] Phone: [phone] Fax: [phone]    Pre-screening note:  Our staff contacted Shannon Benitez and offered her an "in person", "face-to-face" appointment versus a telephone encounter. She indicated preferring the telephone encounter, at this time.   Reason for Virtual Visit: COVID-19*  Social distancing based on CDC and AMA recommendations.   I contacted Shannon Benitez on 05/20/2019 via telephone.      I clearly identified myself as [name], MD. I verified that I was speaking with the correct person using two identifiers (Name: Shannon Benitez, and date of birth: 20-May-1971).  Advanced Informed Consent I sought verbal advanced consent from [facility] for virtual visit interactions. I informed Shannon Benitez of possible security and privacy concerns, risks, and limitations associated with providing "not-in-person" medical evaluation and management services. I also informed Shannon Benitez of the availability of "in-person" appointments. Finally, I informed her that there would be a charge for the virtual visit and that she could be  personally, fully or partially, financially responsible for it. Shannon Benitez expressed understanding and agreed to proceed.   Historic Elements   Shannon Benitez is a 48 y.o. year old, female patient evaluated today after her last encounter by our practice on 05/01/2019. Shannon Benitez  has a past medical history of Diabetes mellitus without complication ([place]), GERD (gastroesophageal reflux disease), Headache, MS (multiple sclerosis) ([place]), Numbness and tingling of both legs  below knees, Osteoarthritis, Vertigo, and Wears dentures. She also  has a past surgical history that includes Abdominal hysterectomy; Cesarean section; Tumor removal (Left); Cataract extraction w/PHACO (Right, 03/18/2019); and Cataract extraction w/PHACO (Left, 04/13/2019). Shannon Benitez has a current medication list which includes the following prescription(s): accu-chek softclix lancets, aspirin-acetaminophen-caffeine, baclofen, cholecalciferol, duloxetine, lamotrigine, loperamide, meloxicam, metformin, mirabegron er, oxycodone, oxycodone, oxycodone, pregabalin, primidone, propranolol, sitagliptin, and omeprazole. She  reports that she quit smoking about 2 years ago. Her smoking use included cigarettes. She has a 9.00 pack-year smoking history. She has quit using smokeless tobacco. She reports that she does not drink alcohol. No history on file for drug. Shannon Benitez is allergic to glatiramer; rebif [interferon beta-1a]; and latex.   HPI  Today, she is being contacted for both, medication management and a post-procedure assessment.  The patient indicates doing much better in terms of the left side.  She is scheduled to come in tomorrow for radiofrequency ablation on the right side.  Post-Procedure Evaluation  Procedure (04/30/2019): Therapeutic left-sided lumbar facet RFA #1 under fluoroscopic guidance and IV sedation Pre-procedure pain level:  6/10 Post-procedure: 0/10 (100% relief)  Sedation: Sedation provided.  Effectiveness during initial hour after procedure(Ultra-Short Term Relief): 100 %   Local anesthetic used: Long-acting (4-6 hours) Effectiveness: Defined as any analgesic benefit obtained secondary to the administration of local anesthetics. This carries significant diagnostic value as to the etiological location, or anatomical origin, of the pain. Duration of benefit is expected to coincide with the duration of the local anesthetic used.  Effectiveness during initial 4-6 hours after  procedure(Short-Term Relief): 100 %   Long-term benefit: Defined as any relief past the pharmacologic duration of the local anesthetics.  Effectiveness past the initial 6 hours after procedure(Long-Term Relief): 50 %(ongoing)   Current  benefits: Defined as benefit that persist at this time.   Analgesia:  50% improved Function: Shannon Benitez reports improvement in function ROM: Shannon Benitez reports improvement in ROM  Pharmacotherapy Assessment  Analgesic: Oxycodone IR 5 mg, 1 tab PO BID (10 mg/day of oxycodone).  (Hydrocodone/APAP 5/325 1 tablet p.o. every 6 hours for acute post-RFA pain) MME/day:15mg /day.   Monitoring: Pharmacotherapy: No side-effects or adverse reactions reported. [place] PMP: PDMP reviewed during this encounter.       Compliance: No problems identified. Effectiveness: Clinically acceptable. Plan: Refer to "POC".  Pertinent Labs   SAFETY SCREENING Profile Lab Results  Component Value Date   SARSCOV2NAA NEGATIVE 05/18/2019   COVIDSOURCE NASOPHARYNGEAL 04/27/2019   Renal Function Lab Results  Component Value Date   BUN 10 11/27/2018   CREATININE 0.79 11/27/2018   BCR 13 11/27/2018   GFRAA 102 11/27/2018   GFRNONAA 89 11/27/2018   Hepatic Function Lab Results  Component Value Date   AST 17 11/27/2018   ALT 35 08/18/2018   ALBUMIN 4.1 11/27/2018   UDS Summary  Date Value Ref Range Status  11/27/2018 FINAL  Final    Comment:    ==================================================================== TOXASSURE COMP DRUG ANALYSIS,UR ==================================================================== Test                             Result       Flag       Units Drug Present and Declared for Prescription Verification   7-aminoclonazepam              319          EXPECTED   ng/mg creat    7-aminoclonazepam is an expected metabolite of clonazepam. Source    of clonazepam is a scheduled prescription medication.   Primidone                      PRESENT       EXPECTED   Phenobarbital                  PRESENT      EXPECTED    Phenobarbital is an expected metabolite of primidone;    Phenobarbital may also be administered as a prescription drug.   Gabapentin                     PRESENT      EXPECTED   Lamotrigine                    PRESENT      EXPECTED   Baclofen                       PRESENT      EXPECTED   Duloxetine                     PRESENT      EXPECTED   Propranolol                    PRESENT      EXPECTED Drug Present not Declared for Prescription Verification   Metaxalone                     PRESENT      UNEXPECTED   Acetaminophen                  PRESENT  UNEXPECTED   Diphenhydramine                PRESENT      UNEXPECTED ==================================================================== Test                      Result    Flag   Units      Ref Range   Creatinine              47               mg/dL      >=20 ==================================================================== Declared Medications:  The flagging and interpretation on this report are based on the  following declared medications.  Unexpected results may arise from  inaccuracies in the declared medications.  **Note: The testing scope of this panel includes these medications:  Baclofen (Lioresal)  Clonazepam (Klonopin)  Duloxetine (Cymbalta)  Gabapentin (Neurontin)  Lamotrigine (Lamictal)  Primidone (Mysoline)  Propranolol (Inderal)  **Note: The testing scope of this panel does not include following  reported medications:  Dimethyl Fumarate  Metformin (Glucophage)  Mirabegron (Myrbetriq)  Omeprazole (Prilosec)  Sitagliptin (Januvia) ==================================================================== For clinical consultation, please call [phone]. ====================================================================    Note: Above Lab results reviewed.  Recent imaging  DG C-Arm 1-60 Min-No Report Fluoroscopy was utilized by the requesting  physician.  No radiographic  interpretation.   Assessment  The primary encounter diagnosis was Chronic pain syndrome. 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  Plan of Care  I have changed Shannon Benitez's oxyCODONE, pregabalin, baclofen, and meloxicam. I am also having her start on oxyCODONE and oxyCODONE. Additionally, I am having her maintain her propranolol, omeprazole, lamoTRIgine, DULoxetine, mirabegron ER, loperamide, aspirin-acetaminophen-caffeine, primidone, Cholecalciferol, Accu-Chek Softclix Lancets, metFORMIN, and sitaGLIPtin.  Pharmacotherapy (Medications Ordered): Meds ordered this encounter  Medications  . oxyCODONE (OXY IR/ROXICODONE) 5 MG immediate release tablet    Sig: Take 1 tablet (5 mg total) by mouth 2 (two) times daily as needed for severe pain. Must last 30 days.    Dispense:  60 tablet    Refill:  0    Chronic Pain: STOP Act - Not applicable. Fill 1 day early if closed on scheduled refill date. Do not fill until: 05/24/2019. To last until: 06/23/2019. Instruct to avoid benzodiazepines within 8 hours of opioid.  . pregabalin (LYRICA) 150 MG capsule    Sig: Take 1 capsule (150 mg total) by mouth 3 (three) times daily.    Dispense:  90 capsule    Refill:  2    Fill one day early if pharmacy is closed on scheduled refill date. May substitute for generic if available.  . baclofen (LIORESAL) 20 MG tablet    Sig: Take 1 tablet (20 mg total) by mouth 4 (four) times daily.    Dispense:  120 tablet    Refill:  2    Fill one day early if pharmacy is closed on scheduled refill date. May substitute for generic if available.  . meloxicam (MOBIC) 15 MG tablet    Sig: Take 1 tablet (15 mg total) by mouth  daily.    Dispense:  30 tablet    Refill:  2    Fill one day early if pharmacy is closed on scheduled refill date. May substitute for  generic if available.  Marland Kitchen oxyCODONE (OXY IR/ROXICODONE) 5 MG immediate release tablet    Sig: Take 1 tablet (5 mg total) by mouth 2 (two) times daily as needed for severe pain. Must last 30 days.    Dispense:  60 tablet    Refill:  0    Chronic Pain: STOP Act - Not applicable. Fill 1 day early if closed on scheduled refill date. Do not fill until: 06/23/2019. To last until: 07/23/2019. Instruct to avoid benzodiazepines within 8 hours of opioid.  Marland Kitchen oxyCODONE (OXY IR/ROXICODONE) 5 MG immediate release tablet    Sig: Take 1 tablet (5 mg total) by mouth 2 (two) times daily as needed for severe pain. Must last 30 days.    Dispense:  60 tablet    Refill:  0    Chronic Pain: STOP Act - Not applicable. Fill 1 day early if closed on scheduled refill date. Do not fill until: 07/23/2019. To last until: 08/22/2019. Instruct to avoid benzodiazepines within 8 hours of opioid.   Orders:  No orders of the defined types were placed in this encounter.  Follow-up plan:   Return in about 13 weeks (around 08/19/2019) for (VV), E/M (MM), in addition, (R) L-FCT RFA #1 tomorrow.     Interventional management options: Considering:   Continue encouraging the patient to bring her BMI below 35. Diagnostic bilateral T11-12 lumbar facet block #1  Therapeutic right-sided lumbar facet RFA #1    Palliative PRN treatment(s):   Palliative bilateral lumbar facet blocks  Palliative left-sided lumbar facet RFA #2 (last done on 04/30/2019)    Recent Visits Date Type Provider Dept  04/30/19 Procedure visit Milinda Pointer, MD [facility]  04/22/19 Office Visit Milinda Pointer, MD [facility]  03/25/19 Office Visit Milinda Pointer, MD [facility]  02/23/19 Office Visit Milinda Pointer, MD [facility]  Showing recent visits within past 90 days  and meeting all other requirements   Today's Visits Date Type Provider Dept  05/20/19 Office Visit Milinda Pointer, MD [facility]  Showing today's visits and meeting all other requirements   Future Appointments Date Type Provider Dept  05/21/19 Appointment Milinda Pointer, MD [facility]  Showing future appointments within next 90 days and meeting all other requirements   I discussed the assessment and treatment plan with the patient. The patient was provided an opportunity to ask questions and all were answered. The patient agreed with the plan and demonstrated an understanding of the instructions.  Patient advised to call back or seek an in-person evaluation if the symptoms or condition worsens.  Total duration of non-face-to-face encounter: 15 minutes.  Note by: [name], MD Date: 05/20/2019; Time: 2:05 PM  Note: This dictation was prepared with Dragon dictation. Any transcriptional errors that may result from this process are unintentional.  Disclaimer:  * Given the special circumstances of the COVID-19 pandemic, the federal government has announced that the Office for Civil Rights (OCR) will exercise its enforcement discretion and will not impose penalties on physicians using telehealth in the event of noncompliance with regulatory requirements under the [place] (HIPAA) in connection with the good faith provision of telehealth during the IRJJO-84 national public health emergency. ([place])

## 2019-05-20 ENCOUNTER — Ambulatory Visit: Payer: Medicaid Other | Attending: Pain Medicine | Admitting: Pain Medicine

## 2019-05-20 ENCOUNTER — Other Ambulatory Visit: Payer: Self-pay

## 2019-05-20 DIAGNOSIS — M5441 Lumbago with sciatica, right side: Secondary | ICD-10-CM

## 2019-05-20 DIAGNOSIS — M5442 Lumbago with sciatica, left side: Secondary | ICD-10-CM

## 2019-05-20 DIAGNOSIS — M79604 Pain in right leg: Secondary | ICD-10-CM | POA: Diagnosis not present

## 2019-05-20 DIAGNOSIS — M62838 Other muscle spasm: Secondary | ICD-10-CM

## 2019-05-20 DIAGNOSIS — M79605 Pain in left leg: Secondary | ICD-10-CM

## 2019-05-20 DIAGNOSIS — G894 Chronic pain syndrome: Secondary | ICD-10-CM | POA: Diagnosis not present

## 2019-05-20 DIAGNOSIS — G8929 Other chronic pain: Secondary | ICD-10-CM

## 2019-05-20 DIAGNOSIS — M792 Neuralgia and neuritis, unspecified: Secondary | ICD-10-CM

## 2019-05-20 DIAGNOSIS — M47816 Spondylosis without myelopathy or radiculopathy, lumbar region: Secondary | ICD-10-CM

## 2019-05-20 DIAGNOSIS — E1142 Type 2 diabetes mellitus with diabetic polyneuropathy: Secondary | ICD-10-CM

## 2019-05-20 MED ORDER — MELOXICAM 15 MG PO TABS: 15.0000 mg | ORAL_TABLET | Freq: Every day | ORAL | 2 refills | Status: DC

## 2019-05-20 MED ORDER — OXYCODONE HCL 5 MG PO TABS: 5.0000 mg | ORAL_TABLET | Freq: Two times a day (BID) | ORAL | 0 refills | Status: DC | PRN

## 2019-05-20 MED ORDER — BACLOFEN 20 MG PO TABS: 20.0000 mg | ORAL_TABLET | Freq: Four times a day (QID) | ORAL | 2 refills | Status: DC

## 2019-05-20 MED ORDER — PREGABALIN 150 MG PO CAPS: 150.0000 mg | ORAL_CAPSULE | Freq: Three times a day (TID) | ORAL | 2 refills | Status: DC

## 2019-05-20 NOTE — Progress Notes (Signed)
Patient's Name: Shannon Benitez  MRN: [id number]  Referring Provider: Marygrace Drought, MD  DOB: 01-02-1971  PCP: Marygrace Drought, MD  DOS: 05/21/2019  Note by: [name], MD  Service setting: Ambulatory outpatient  Specialty: Interventional Pain Management  Patient type: Established  Location: [facility] (AMB) Pain Management Facility  Visit type: Interventional Procedure   Primary Reason for Visit: Interventional Pain Management Treatment. CC: Back Pain (right, lower)  Procedure:          Anesthesia, Analgesia, Anxiolysis:  Type: Thermal Lumbar Facet, Medial Branch Radiofrequency Ablation/Neurotomy  #1  Primary Purpose: Therapeutic Region: Posterolateral Lumbosacral Spine Level: L2, L3, L4, L5, & S1 Medial Branch Level(s). These levels will denervate the L3-4, L4-5, and the L5-S1 lumbar facet joints. Laterality: Right  Type: Moderate (Conscious) Sedation combined with Local Anesthesia Indication(s): Analgesia and Anxiety Route: Intravenous (IV) IV Access: Secured Sedation: Meaningful verbal contact was maintained at all times during the procedure  Local Anesthetic: Lidocaine 1-2%  Position: Prone   Indications: 1. Lumbar facet syndrome (Bilateral) (L>R)   2. Spondylosis without myelopathy or radiculopathy, lumbosacral region   3. Chronic low back pain (Bilateral) (L>R) w/o sciatica   4. DDD (degenerative disc disease), lumbosacral   5. Lumbar facet hypertrophy (Multilevel) (Bilateral)   6. Acute postoperative pain    Shannon Benitez has been dealing with the above chronic pain for longer than three months and has either failed to respond, was unable to tolerate, or simply did not get enough benefit from other more conservative therapies including, but not limited to: 1. Over-the-counter medications 2. Anti-inflammatory medications 3. Muscle relaxants 4. Membrane stabilizers 5. Opioids 6. Physical therapy and/or chiropractic manipulation 7. Modalities (Heat, ice,  etc.) 8. Invasive techniques such as nerve blocks. Shannon Benitez has attained more than 50% relief of the pain from a series of diagnostic injections conducted in separate occasions.  Pain Score: Pre-procedure: 5 /10 Post-procedure: 0-No pain/10  Pertinent Labs  COVID-19 screennig: Lab Results  Component Value Date   Why NEGATIVE 05/18/2019   Pre-op Assessment:  Shannon Benitez is a 48 y.o. (year old), female patient, seen today for interventional treatment. She  has a past surgical history that includes Abdominal hysterectomy; Cesarean section; Tumor removal (Left); Cataract extraction w/PHACO (Right, 03/18/2019); and Cataract extraction w/PHACO (Left, 04/13/2019). Shannon Benitez has a current medication list which includes the following prescription(s): accu-chek softclix lancets, aspirin-acetaminophen-caffeine, baclofen, cholecalciferol, duloxetine, hydrocodone-acetaminophen, hydrocodone-acetaminophen, lamotrigine, loperamide, meloxicam, metformin, mirabegron er, oxycodone, oxycodone, oxycodone, pregabalin, primidone, propranolol, sitagliptin, and omeprazole, and the following Facility-Administered Medications: fentanyl and midazolam. Her primarily concern today is the Back Pain (right, lower)  Initial Vital Signs:  Pulse/HCG Rate: 75ECG Heart Rate: 69 Temp: 98.6 F (37 C) Resp: 14 BP: 113/89 SpO2: 98 %  BMI: Estimated body mass index is 36.09 kg/m as calculated from the following:   Height as of this encounter: 5\' 1"  (1.549 m).   Weight as of this encounter: 191 lb (86.6 kg).  Risk Assessment: Allergies: Reviewed. She is allergic to glatiramer; rebif [interferon beta-1a]; and latex.  Allergy Precautions: None required Coagulopathies: Reviewed. None identified.  Blood-thinner therapy: None at this time Active Infection(s): Reviewed. None identified. Shannon Benitez is afebrile  Site Confirmation: Shannon Benitez was asked to confirm the procedure and laterality before  marking the site Procedure checklist: Completed Consent: Before the procedure and under the influence of no sedative(s), amnesic(s), or anxiolytics, the patient was informed of the treatment options, risks and possible complications. To fulfill our ethical and legal obligations,  as recommended by the American Medical Association's Code of Ethics, I have informed the patient of my clinical impression; the nature and purpose of the treatment or procedure; the risks, benefits, and possible complications of the intervention; the alternatives, including doing nothing; the risk(s) and benefit(s) of the alternative treatment(s) or procedure(s); and the risk(s) and benefit(s) of doing nothing. The patient was provided information about the general risks and possible complications associated with the procedure. These may include, but are not limited to: failure to achieve desired goals, infection, bleeding, organ or nerve damage, allergic reactions, paralysis, and death. In addition, the patient was informed of those risks and complications associated to Spine-related procedures, such as failure to decrease pain; infection (i.e.: Meningitis, epidural or intraspinal abscess); bleeding (i.e.: epidural hematoma, subarachnoid hemorrhage, or any other type of intraspinal or peri-dural bleeding); organ or nerve damage (i.e.: Any type of peripheral nerve, nerve root, or spinal cord injury) with subsequent damage to sensory, motor, and/or autonomic systems, resulting in permanent pain, numbness, and/or weakness of one or several areas of the body; allergic reactions; (i.e.: anaphylactic reaction); and/or death. Furthermore, the patient was informed of those risks and complications associated with the medications. These include, but are not limited to: allergic reactions (i.e.: anaphylactic or anaphylactoid reaction(s)); adrenal axis suppression; blood sugar elevation that in diabetics may result in ketoacidosis or comma; water  retention that in patients with history of congestive heart failure may result in shortness of breath, pulmonary edema, and decompensation with resultant heart failure; weight gain; swelling or edema; medication-induced neural toxicity; particulate matter embolism and blood vessel occlusion with resultant organ, and/or nervous system infarction; and/or aseptic necrosis of one or more joints. Finally, the patient was informed that Medicine is not an exact science; therefore, there is also the possibility of unforeseen or unpredictable risks and/or possible complications that may result in a catastrophic outcome. The patient indicated having understood very clearly. We have given the patient no guarantees and we have made no promises. Enough time was given to the patient to ask questions, all of which were answered to the patient's satisfaction. Shannon Benitez has indicated that she wanted to continue with the procedure. Attestation: I, the ordering provider, attest that I have discussed with the patient the benefits, risks, side-effects, alternatives, likelihood of achieving goals, and potential problems during recovery for the procedure that I have provided informed consent. Date   Time: 05/21/2019 10:38 AM  Pre-Procedure Preparation:  Monitoring: As per clinic protocol. Respiration, ETCO2, SpO2, BP, heart rate and rhythm monitor placed and checked for adequate function Safety Precautions: Patient was assessed for positional comfort and pressure points before starting the procedure. Time-out: I initiated and conducted the "Time-out" before starting the procedure, as per protocol. The patient was asked to participate by confirming the accuracy of the "Time Out" information. Verification of the correct person, site, and procedure were performed and confirmed by me, the nursing staff, and the patient. "Time-out" conducted as per Joint Commission's Universal Protocol (UP.01.01.01). Time: 1120  Description of  Procedure:          Laterality: Right Levels:  L2, L3, L4, L5, & S1 Medial Branch Level(s), at the L3-4, L4-5, and the L5-S1 lumbar facet joints. Area Prepped: Lumbosacral Prepping solution: DuraPrep (Iodine Povacrylex [0.7% available iodine] and Isopropyl Alcohol, 74% w/w) Safety Precautions: Aspiration looking for blood return was conducted prior to all injections. At no point did we inject any substances, as a needle was being advanced. Before injecting, the patient was  told to immediately notify me if she was experiencing any new onset of "ringing in the ears, or metallic taste in the mouth". No attempts were made at seeking any paresthesias. Safe injection practices and needle disposal techniques used. Medications properly checked for expiration dates. SDV (single dose vial) medications used. After the completion of the procedure, all disposable equipment used was discarded in the proper designated medical waste containers. Local Anesthesia: Protocol guidelines were followed. The patient was positioned over the fluoroscopy table. The area was prepped in the usual manner. The time-out was completed. The target area was identified using fluoroscopy. A 12-in long, straight, sterile hemostat was used with fluoroscopic guidance to locate the targets for each level blocked. Once located, the skin was marked with an approved surgical skin marker. Once all sites were marked, the skin (epidermis, dermis, and hypodermis), as well as deeper tissues (fat, connective tissue and muscle) were infiltrated with a small amount of a short-acting local anesthetic, loaded on a 10cc syringe with a 25G, 1.5-in  Needle. An appropriate amount of time was allowed for local anesthetics to take effect before proceeding to the next step. Local Anesthetic: Lidocaine 2.0% The unused portion of the local anesthetic was discarded in the proper designated containers. Technical explanation of process:  Radiofrequency Ablation (RFA) L2  Medial Branch Nerve RFA: The target area for the L2 medial branch is at the junction of the postero-lateral aspect of the superior articular process and the superior, posterior, and medial edge of the transverse process of L3. Under fluoroscopic guidance, a Radiofrequency needle was inserted until contact was made with os over the superior postero-lateral aspect of the pedicular shadow (target area). Sensory and motor testing was conducted to properly adjust the position of the needle. Once satisfactory placement of the needle was achieved, the numbing solution was slowly injected after negative aspiration for blood. 2.0 mL of the nerve block solution was injected without difficulty or complication. After waiting for at least 3 minutes, the ablation was performed. Once completed, the needle was removed intact. L3 Medial Branch Nerve RFA: The target area for the L3 medial branch is at the junction of the postero-lateral aspect of the superior articular process and the superior, posterior, and medial edge of the transverse process of L4. Under fluoroscopic guidance, a Radiofrequency needle was inserted until contact was made with os over the superior postero-lateral aspect of the pedicular shadow (target area). Sensory and motor testing was conducted to properly adjust the position of the needle. Once satisfactory placement of the needle was achieved, the numbing solution was slowly injected after negative aspiration for blood. 2.0 mL of the nerve block solution was injected without difficulty or complication. After waiting for at least 3 minutes, the ablation was performed. Once completed, the needle was removed intact. L4 Medial Branch Nerve RFA: The target area for the L4 medial branch is at the junction of the postero-lateral aspect of the superior articular process and the superior, posterior, and medial edge of the transverse process of L5. Under fluoroscopic guidance, a Radiofrequency needle was inserted  until contact was made with os over the superior postero-lateral aspect of the pedicular shadow (target area). Sensory and motor testing was conducted to properly adjust the position of the needle. Once satisfactory placement of the needle was achieved, the numbing solution was slowly injected after negative aspiration for blood. 2.0 mL of the nerve block solution was injected without difficulty or complication. After waiting for at least 3 minutes, the ablation was  performed. Once completed, the needle was removed intact. L5 Medial Branch Nerve RFA: The target area for the L5 medial branch is at the junction of the postero-lateral aspect of the superior articular process of S1 and the superior, posterior, and medial edge of the sacral ala. Under fluoroscopic guidance, a Radiofrequency needle was inserted until contact was made with os over the superior postero-lateral aspect of the pedicular shadow (target area). Sensory and motor testing was conducted to properly adjust the position of the needle. Once satisfactory placement of the needle was achieved, the numbing solution was slowly injected after negative aspiration for blood. 2.0 mL of the nerve block solution was injected without difficulty or complication. After waiting for at least 3 minutes, the ablation was performed. Once completed, the needle was removed intact. S1 Medial Branch Nerve RFA: The target area for the S1 medial branch is located inferior to the junction of the S1 superior articular process and the L5 inferior articular process, posterior, inferior, and lateral to the 6 o'clock position of the L5-S1 facet joint, just superior to the S1 posterior foramen. Under fluoroscopic guidance, the Radiofrequency needle was advanced until contact was made with os over the Target area. Sensory and motor testing was conducted to properly adjust the position of the needle. Once satisfactory placement of the needle was achieved, the numbing solution was  slowly injected after negative aspiration for blood. 2.0 mL of the nerve block solution was injected without difficulty or complication. After waiting for at least 3 minutes, the ablation was performed. Once completed, the needle was removed intact. Radiofrequency lesioning (ablation):  Radiofrequency Generator: NeuroTherm [id number] Sensory Stimulation Parameters: 50 Hz was used to locate & identify the nerve, making sure that the needle was positioned such that there was no sensory stimulation below 0.3 V or above 0.7 V. Motor Stimulation Parameters: 2 Hz was used to evaluate the motor component. Care was taken not to lesion any nerves that demonstrated motor stimulation of the lower extremities at an output of less than 2.5 times that of the sensory threshold, or a maximum of 2.0 V. Lesioning Technique Parameters: Standard Radiofrequency settings. (Not bipolar or pulsed.) Temperature Settings: 80 degrees C Lesioning time: 60 seconds Intra-operative Compliance: Compliant Materials & Medications: Needle(s) (Electrode/Cannula) Type: Teflon-coated, curved tip, Radiofrequency needle(s) Gauge: 22G Length: 10cm Numbing solution: 0.2% PF-Ropivacaine + Triamcinolone (40 mg/mL) diluted to a final concentration of 4 mg of Triamcinolone/mL of Ropivacaine The unused portion of the solution was discarded in the proper designated containers.  Once the entire procedure was completed, the treated area was cleaned, making sure to leave some of the prepping solution back to take advantage of its long term bactericidal properties.  Illustration of the posterior view of the lumbar spine and the posterior neural structures. Laminae of L2 through S1 are labeled. DPRL5, dorsal primary ramus of L5; DPRS1, dorsal primary ramus of S1; DPR3, dorsal primary ramus of L3; FJ, facet (zygapophyseal) joint L3-L4; I, inferior articular process of L4; LB1, lateral branch of dorsal primary ramus of L1; IAB, inferior articular branches  from L3 medial branch (supplies L4-L5 facet joint); IBP, intermediate branch plexus; MB3, medial branch of dorsal primary ramus of L3; NR3, third lumbar nerve root; S, superior articular process of L5; SAB, superior articular branches from L4 (supplies L4-5 facet joint also); TP3, transverse process of L3.  Vitals:   05/21/19 1149 05/21/19 1158 05/21/19 1209 05/21/19 1219  BP: 106/72 111/74 102/70 105/79  Pulse:      Resp:  14 16 10 12   Temp:      TempSrc:      SpO2: 94% 95% 97% 100%  Weight:      Height:        Start Time: 1120 hrs. End Time: 1149 hrs.  Imaging Guidance (Spinal):          Type of Imaging Technique: Fluoroscopy Guidance (Spinal) Indication(s): Assistance in needle guidance and placement for procedures requiring needle placement in or near specific anatomical locations not easily accessible without such assistance. Exposure Time: Please see nurses notes. Contrast: None used. Fluoroscopic Guidance: I was personally present during the use of fluoroscopy. "Tunnel Vision Technique" used to obtain the best possible view of the target area. Parallax error corrected before commencing the procedure. "Direction-depth-direction" technique used to introduce the needle under continuous pulsed fluoroscopy. Once target was reached, antero-posterior, oblique, and lateral fluoroscopic projection used confirm needle placement in all planes. Images permanently stored in EMR. Interpretation: No contrast injected. I personally interpreted the imaging intraoperatively. Adequate needle placement confirmed in multiple planes. Permanent images saved into the patient's record.  Antibiotic Prophylaxis:   Anti-infectives (From admission, onward)   None     Indication(s): None identified  Post-operative Assessment:  Post-procedure Vital Signs:  Pulse/HCG Rate: 7574 Temp: 98.6 F (37 C) Resp: 12 BP: 105/79 SpO2: 100 %  EBL: None  Complications: No immediate post-treatment complications  observed by team, or reported by patient.  Note: The patient tolerated the entire procedure well. A repeat set of vitals were taken after the procedure and the patient was kept under observation following institutional policy, for this type of procedure. Post-procedural neurological assessment was performed, showing return to baseline, prior to discharge. The patient was provided with post-procedure discharge instructions, including a section on how to identify potential problems. Should any problems arise concerning this procedure, the patient was given instructions to immediately contact us, at any time, without hesitation. In any case, we plan to contact the patient by telephone for a follow-up status report regarding this interventional procedure.  Comments:  No additional relevant information.  Plan of Care  Orders:  Orders Placed This Encounter  Procedures   Radiofrequency,Lumbar    Scheduling Instructions:     Side(s): Right-sided     Level: L3-4, L4-5, & L5-S1 Facets (L2, L3, L4, L5, & S1 Medial Branch Nerves)     Sedation: With Sedation     Timeframe: Today    Order Specific Question:   Where will this procedure be performed?    Answer:   [facility]   SARS Coronavirus 2 (Performed in [place] hospital lab)    Standing Status:   Standing    Number of Occurrences:   1    Order Specific Question:   Pre-procedural testing    Answer:   Yes   [facility] C-ARM 1-60 MIN NO REPORT    Intraoperative interpretation by procedural physician at [place].    Standing Status:   Standing    Number of Occurrences:   1    Order Specific Question:   Reason for exam:    Answer:   Assistance in needle guidance and placement for procedures requiring needle placement in or near specific anatomical locations not easily accessible without such assistance.   Provider attestation of informed consent for procedure/surgical case    I, the ordering provider, attest that I  have discussed with the patient the benefits, risks, side effects, alternatives, likelihood of achieving goals and  potential problems during recovery for the procedure that I have provided informed consent.    Standing Status:   Standing    Number of Occurrences:   1   Informed Consent Details: Transcribe to consent form and obtain patient signature    Consent Attestation: I, the ordering provider, attest that I have discussed with the patient the benefits, risks, side-effects, alternatives, likelihood of achieving goals, and potential problems during recovery for the procedure that I have provided informed consent.    Standing Status:   Standing    Number of Occurrences:   1    Order Specific Question:   Procedure    Answer:   Right Lumbar facet medial branch RFA under fluoroscopic guidance.    Order Specific Question:   Surgeon    Answer:   Kathlen Brunswick. Dossie Arbour, MD    Order Specific Question:   Indication/Reason    Answer:   Chronic low back pain secondary to lumbar facet syndrome   Chronic Opioid Analgesic:  Oxycodone IR 5 mg, 1 tab PO BID (10 mg/day of oxycodone).  (Hydrocodone/APAP 5/325 1 tablet p.o. every 6 hours for acute post-RFA pain) MME/day:15mg /day.   Medications ordered for procedure: Meds ordered this encounter  Medications   lidocaine (XYLOCAINE) 2 % (with pres) injection 400 mg   lactated ringers infusion 1,000 mL   midazolam (VERSED) 5 MG/5ML injection 1-2 mg    Make sure Flumazenil is available in the pyxis when using this medication. If oversedation occurs, administer 0.2 mg IV over 15 sec. If after 45 sec no response, administer 0.2 mg again over 1 min; may repeat at 1 min intervals; not to exceed 4 doses (1 mg)   fentaNYL (SUBLIMAZE) injection 25-50 mcg    Make sure Narcan is available in the pyxis when using this medication. In the event of respiratory depression (RR< 8/min): Titrate NARCAN (naloxone) in increments of 0.1 to 0.2 mg IV at 2-3 minute intervals,  until desired degree of reversal.   ropivacaine (PF) 2 mg/mL (0.2%) (NAROPIN) injection 9 mL   triamcinolone acetonide (KENALOG-40) injection 40 mg   HYDROcodone-acetaminophen (NORCO/VICODIN) 5-325 MG tablet    Sig: Take 1 tablet by mouth every 6 (six) hours as needed for up to 7 days for severe pain. Must last 7 days.    Dispense:  28 tablet    Refill:  0    For acute post-operative pain. Not to be refilled.  Must last 7 days.   HYDROcodone-acetaminophen (NORCO/VICODIN) 5-325 MG tablet    Sig: Take 1 tablet by mouth every 6 (six) hours as needed for up to 7 days for severe pain. Must last 7 days.    Dispense:  28 tablet    Refill:  0    For acute post-operative pain. Not to be refilled.  Must last 7 days.   Medications administered: We administered lidocaine, lactated ringers, midazolam, fentaNYL, ropivacaine (PF) 2 mg/mL (0.2%), and triamcinolone acetonide.  See the medical record for exact dosing, route, and time of administration.  Follow-up plan:   Return for (VV), 2 wk PP-F/U Eval.       Interventional management options: Considering:   Continue encouraging the patient to bring her BMI below 35. Diagnostic bilateral T11-12 lumbar facet block #1  Therapeutic right-sided lumbar facet RFA #1    Palliative PRN treatment(s):   Palliative bilateral lumbar facet blocks  Palliative left-sided lumbar facet RFA #2 (last done on 04/30/2019)     Recent Visits Date Type Provider Dept  05/20/19 Office Visit Milinda Pointer, MD [facility]  04/30/19 Procedure visit Milinda Pointer, MD [facility]  04/22/19 Office Visit Milinda Pointer, MD [facility]  03/25/19 Office Visit Milinda Pointer, MD [facility]  02/23/19 Office Visit Milinda Pointer, MD [facility]  Showing recent visits within past 90 days and meeting all other requirements   Today's Visits Date Type Provider Dept  05/21/19 Procedure visit Milinda Pointer, MD [facility]  Showing today's visits and meeting all other requirements   Future Appointments Date Type Provider Dept  06/09/19 Appointment Milinda Pointer, MD [facility]  08/19/19 Appointment Milinda Pointer, MD [facility]  Showing future appointments within next 90 days and meeting all other requirements   Disposition: Discharge home  Discharge Date & Time: 05/21/2019; 1220 hrs.   Primary Care Physician: Marygrace Drought, MD Location: [facility] Note by: [name], MD Date: 05/21/2019; Time: 12:27 PM  Disclaimer:  Medicine is not an Chief Strategy Officer. The only guarantee in medicine is that nothing is guaranteed. It is important to note that the decision to proceed with this intervention was based on the information collected from the patient. The Data and conclusions were drawn from the patient's questionnaire, the interview, and the physical examination. Because the information was provided in large part by the patient, it cannot be guaranteed that it has not been purposely or unconsciously manipulated. Every effort has been made to obtain as much relevant data as possible for this evaluation. It is important to note that the conclusions that lead to this procedure are derived in large part from the available data. Always take into account that the treatment will also be dependent on availability of resources and existing treatment guidelines, considered by other Pain Management Practitioners as being common knowledge and practice, at the time of the intervention. For Medico-Legal purposes, it is also important to point out that variation in procedural techniques and pharmacological choices are the acceptable norm. The indications, contraindications, technique, and results of the above procedure should only be interpreted and judged by a Board-Certified Interventional Pain Specialist with extensive familiarity  and expertise in the same exact procedure and technique.

## 2019-05-20 NOTE — Patient Instructions (Signed)

## 2019-05-21 ENCOUNTER — Other Ambulatory Visit: Payer: Self-pay

## 2019-05-21 ENCOUNTER — Encounter: Payer: Self-pay | Admitting: Pain Medicine

## 2019-05-21 ENCOUNTER — Ambulatory Visit (HOSPITAL_BASED_OUTPATIENT_CLINIC_OR_DEPARTMENT_OTHER): Payer: Medicaid Other | Admitting: Pain Medicine

## 2019-05-21 ENCOUNTER — Ambulatory Visit
Admission: RE | Admit: 2019-05-21 | Discharge: 2019-05-21 | Disposition: A | Discharge status: Home / Self Care | Payer: Medicaid Other | Source: Ambulatory Visit | Attending: Pain Medicine | Admitting: Pain Medicine

## 2019-05-21 VITALS — BP 110/66 | HR 75 | Temp 98.6°F | Resp 20 | Ht 61.0 in | Wt 191.0 lb

## 2019-05-21 DIAGNOSIS — M47816 Spondylosis without myelopathy or radiculopathy, lumbar region: Secondary | ICD-10-CM | POA: Insufficient documentation

## 2019-05-21 DIAGNOSIS — G8929 Other chronic pain: Secondary | ICD-10-CM

## 2019-05-21 DIAGNOSIS — G8918 Other acute postprocedural pain: Secondary | ICD-10-CM

## 2019-05-21 DIAGNOSIS — M5137 Other intervertebral disc degeneration, lumbosacral region: Secondary | ICD-10-CM | POA: Insufficient documentation

## 2019-05-21 DIAGNOSIS — M545 Low back pain: Secondary | ICD-10-CM

## 2019-05-21 DIAGNOSIS — M47817 Spondylosis without myelopathy or radiculopathy, lumbosacral region: Secondary | ICD-10-CM | POA: Insufficient documentation

## 2019-05-21 MED ORDER — HYDROCODONE-ACETAMINOPHEN 5-325 MG PO TABS: 1.0000 | ORAL_TABLET | Freq: Four times a day (QID) | ORAL | 0 refills | Status: DC | PRN

## 2019-05-21 MED ORDER — FENTANYL CITRATE (PF) 100 MCG/2ML IJ SOLN
25.0000 ug | INTRAMUSCULAR | Status: DC | PRN
  Administered 2019-05-21: 100 ug via INTRAVENOUS
  Filled 2019-05-21: qty 2

## 2019-05-21 MED ORDER — MIDAZOLAM HCL 5 MG/5ML IJ SOLN
1.0000 mg | INTRAMUSCULAR | Status: DC | PRN
  Administered 2019-05-21: 3 mg via INTRAVENOUS
  Filled 2019-05-21: qty 5

## 2019-05-21 MED ORDER — LACTATED RINGERS IV SOLN
1000.0000 mL | Freq: Once | INTRAVENOUS | Status: AC
  Administered 2019-05-21: 1000 mL via INTRAVENOUS

## 2019-05-21 MED ORDER — LIDOCAINE HCL 2 % IJ SOLN
20.0000 mL | Freq: Once | INTRAMUSCULAR | Status: AC
  Administered 2019-05-21: 400 mg
  Filled 2019-05-21: qty 40

## 2019-05-21 MED ORDER — ROPIVACAINE HCL 2 MG/ML IJ SOLN
9.0000 mL | Freq: Once | INTRAMUSCULAR | Status: AC
  Administered 2019-05-21: 9 mL via PERINEURAL
  Filled 2019-05-21: qty 10

## 2019-05-21 MED ORDER — TRIAMCINOLONE ACETONIDE 40 MG/ML IJ SUSP
40.0000 mg | Freq: Once | INTRAMUSCULAR | Status: AC
  Administered 2019-05-21: 40 mg
  Filled 2019-05-21: qty 1

## 2019-05-21 NOTE — Progress Notes (Deleted)
TEST 1          Break 1   Break 2   Break 3   Break 4   Break 5    Break 6    Break 7   Break 8   Break 9   Break 10   Break 11   Break 12   Break 13   Break 14   Break 15   Break 16   Break 17   Break 18   Break 19   Break 20   Break 21   Break 22   Break 23   Break 24   Break 25   Break 26   Break 27   Break 28   Break 29   Break 31   Break 32   Break 33   Break 34    Break 36     Disregard this section. 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    Progress note subjective bookmarks    Break 1   Break 2             Progress note objective bookmarks      Break 3   Break 4             Progress note assessment bookmarks    Break 5   Break 6             Progress note plan bookmarks    Break 7   Break 8            Progress note Section A bookmarks    Break 9        Progress note Section B bookmarks    Break 10        Progress note Section C bookmarks    Break 11        Progress note Section D bookmarks    Break 12

## 2019-05-21 NOTE — Progress Notes (Deleted)
TEST 1   TEST 1. Please disregard. 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

## 2019-05-21 NOTE — Progress Notes (Deleted)
TEST 1. Please disregard. 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  (Hydrocodone/APAP 5/325 1 tablet p.o. every 6 hours for acute post-RFA pain) MME/day:15mg /day. -SECANOHEADERBEGIN-LASTVISITSECTIONATEXT-SECTIONAENDOxycodone IR 5 mg, 1 tab PO BID (10 mg/day of oxycodone).  (Hydrocodone/APAP 5/325 1 tablet p.o. every 6 hours for acute post-RFA pain) MME/day:15mg /day.   Break 2 Section B SECBNOHEADERBEGIN SECTIONBEND  LASTVISITSECTIONBTEXTOxycodone IR 5 mg, 1 tab PO BID (10 mg/day of oxycodone).  (Hydrocodone/APAP 5/325 1 tablet p.o. every 6 hours for acute post-RFA pain) MME/day:15mg /day. -SECBNOHEADERBEGIN-LASTVISITSECTIONBTEXT-SECTIONBENDOxycodone IR 5 mg, 1 tab PO BID (10 mg/day of oxycodone).  (Hydrocodone/APAP 5/325 1 tablet p.o. every 6 hours for acute post-RFA pain) MME/day:15mg /day.   Break 3 Section C SECCNOHEADERBEGIN SECTIONCEND  LASTVISITSECTIONCTEXTOxycodone IR 5 mg, 1 tab PO BID (10 mg/day of oxycodone).  (Hydrocodone/APAP 5/325 1 tablet p.o. every 6 hours for acute post-RFA pain) MME/day:15mg /day. -SECCNOHEADERBEGIN-LASTVISITSECTIONCTEXT-SECTIONCENDOxycodone IR 5 mg, 1 tab PO BID (10 mg/day of oxycodone).  (Hydrocodone/APAP 5/325 1 tablet p.o. every 6 hours for acute post-RFA pain) MME/day:15mg /day.   Break 4 Section D SECDNOHEADERBEGIN SECTIONDEND  LASTVISITSECTIONDTEXTOxycodone IR 5 mg, 1 tab PO BID (10 mg/day of oxycodone).  (Hydrocodone/APAP 5/325 1 tablet p.o. every 6 hours for acute post-RFA pain) MME/day:15mg /day. -SECDNOHEADERBEGIN-LASTVISITSECTIONDTEXT-SECTIONDENDOxycodone IR 5 mg, 1 tab PO BID (10 mg/day of oxycodone).  (Hydrocodone/APAP 5/325 1 tablet p.o. every 6 hours for acute post-RFA pain) MME/day:15mg /day.   Break 5 Progress Note (SOAP) Bookmarks: Subjective section: Code to mark where to Begin:     Code  to mark where to End:     Code to pull the above section from the last note into the new one: Oxycodone IR 5 mg, 1 tab PO BID (10 mg/day of oxycodone).  (Hydrocodone/APAP 5/325 1 tablet p.o. every 6 hours for acute post-RFA pain) MME/day:15mg /day.   This brings it together from note to note:   Oxycodone IR 5 mg, 1 tab PO BID (10 mg/day of oxycodone).  (Hydrocodone/APAP 5/325 1 tablet p.o. every 6 hours for acute post-RFA pain) MME/day:15mg /day.    Objective section: Code to mark where to Begin:     Code to mark where to End:     Code to pull the above section from the last note into the new one: {There is no content from the last Objective section.}   This brings it together from note to note:   {There is no content from the last Objective section.}    Assessment section: Code to mark where to Begin:     Code to mark where to End:     Code to pull the above section from the last note into the new one: {There is no content from the last Assessment section.}   This brings it together from note to note:   {There is no content from the last Assessment section.}    Plan section: Code to mark where to Begin:     Code to mark where to End:     Code to pull the above section from the last note into the new one:  Interventional management options: Considering:   Continue encouraging the patient to bring her BMI below 35. Diagnostic bilateral T11-12 lumbar facet block #1  Therapeutic right-sided lumbar facet RFA #1    Palliative PRN treatment(s):   Palliative bilateral lumbar facet blocks  Palliative left-sided lumbar facet RFA #2 (last done on 04/30/2019)     This  brings it together from note to note:    Interventional management options: Considering:   Continue encouraging the patient to bring her BMI below 35. Diagnostic bilateral T11-12 lumbar facet block #1  Therapeutic right-sided lumbar facet RFA #1    Palliative PRN treatment(s):   Palliative bilateral lumbar facet blocks   Palliative left-sided lumbar facet RFA #2 (last done on 04/30/2019)

## 2019-05-21 NOTE — Progress Notes (Signed)
Safety precautions to be maintained throughout the outpatient stay will include: orient to surroundings, keep bed in low position, maintain call bell within reach at all times, provide assistance with transfer out of bed and ambulation.  

## 2019-05-22 ENCOUNTER — Telehealth: Payer: Self-pay | Admitting: *Deleted

## 2019-06-08 ENCOUNTER — Encounter: Payer: Self-pay | Admitting: Pain Medicine

## 2019-06-09 ENCOUNTER — Encounter: Payer: Self-pay | Admitting: Pain Medicine

## 2019-06-09 ENCOUNTER — Other Ambulatory Visit: Payer: Self-pay

## 2019-06-09 ENCOUNTER — Ambulatory Visit: Payer: Medicaid Other | Attending: Pain Medicine | Admitting: Pain Medicine

## 2019-06-09 DIAGNOSIS — M79605 Pain in left leg: Secondary | ICD-10-CM

## 2019-06-09 DIAGNOSIS — G894 Chronic pain syndrome: Secondary | ICD-10-CM | POA: Diagnosis not present

## 2019-06-09 DIAGNOSIS — G8929 Other chronic pain: Secondary | ICD-10-CM

## 2019-06-09 DIAGNOSIS — M79604 Pain in right leg: Secondary | ICD-10-CM

## 2019-06-09 DIAGNOSIS — M5441 Lumbago with sciatica, right side: Secondary | ICD-10-CM | POA: Diagnosis not present

## 2019-06-09 DIAGNOSIS — M5442 Lumbago with sciatica, left side: Secondary | ICD-10-CM

## 2019-06-09 NOTE — Progress Notes (Signed)
Pain Management Virtual Encounter Note - Virtual Visit via Telephone Telehealth (real-time audio visits between healthcare provider and patient).   Patient's Phone No. & Preferred Pharmacy:  [phone] (home); [phone] (mobile); (Preferred) [phone] [email or web address]  Shannon Benitez, Shannon Benitez [place] [id number] Phone: [phone] Fax: [phone]    Pre-screening note:  Our staff contacted Shannon Benitez and offered her an "in person", "face-to-face" appointment versus a telephone encounter. She indicated preferring the telephone encounter, at this time.   Reason for Virtual Visit: COVID-19*  Social distancing based on CDC and AMA recommendations.   I contacted Shannon Benitez on 06/09/2019 via telephone.      I clearly identified myself as [name], MD. I verified that I was speaking with the correct person using two identifiers (Name: Shannon Benitez, and date of birth: [birth year]).  Advanced Informed Consent I sought verbal advanced consent from [facility] for virtual visit interactions. I informed Shannon Benitez of possible security and privacy concerns, risks, and limitations associated with providing "not-in-person" medical evaluation and management services. I also informed Shannon Benitez of the availability of "in-person" appointments. Finally, I informed her that there would be a charge for the virtual visit and that she could be  personally, fully or partially, financially responsible for it. Shannon Benitez expressed understanding and agreed to proceed.   Historic Elements   Shannon Benitez is a 48 y.o. year old, female patient evaluated today after her last encounter by our practice on 05/22/2019. Shannon Benitez  has a past medical history of Acute postoperative pain (04/30/2019), Diabetes mellitus without complication ([place]), GERD (gastroesophageal reflux disease), Headache, MS (multiple sclerosis)  ([place]), Numbness and tingling of both legs below knees, Osteoarthritis, Vertigo, and Wears dentures. She also  has a past surgical history that includes Abdominal hysterectomy; Cesarean section; Tumor removal (Left); Cataract extraction w/PHACO (Right, 03/18/2019); and Cataract extraction w/PHACO (Left, 04/13/2019). Shannon Benitez has a current medication list which includes the following prescription(s): accu-chek softclix lancets, aspirin-acetaminophen-caffeine, baclofen, duloxetine, lamotrigine, loperamide, meloxicam, metformin, mirabegron er, oxycodone, oxycodone, oxycodone, pregabalin, primidone, propranolol, sitagliptin, and omeprazole. She  reports that she quit smoking about 2 years ago. Her smoking use included cigarettes. She has a 9.00 pack-year smoking history. She has quit using smokeless tobacco. She reports that she does not drink alcohol. No history on file for drug. Shannon Benitez is allergic to glatiramer; rebif [interferon beta-1a]; and latex.   HPI  Today, she is being contacted for a post-procedure assessment.  The patient is extremely happy with the results that she has obtained.  She indicates that prior to coming to our clinic she was not able to even walk and at this point she is walking laps around her house and has been able to lose quite a bit of weight.  She feels that she has gotten a new lease on life and she was very thankful for it.  At this point will simply continue managing her pain as it develops.  She is using her medications appropriately and has increased her level of activity significantly.  Post-Procedure Evaluation  Procedure: Therapeutic right-sided lumbar facet RFA #1 under fluoroscopic guidance and IV sedation Pre-procedure pain level:  5/10 Post-procedure: 0/10 (100% relief)  Sedation: Sedation provided.  Effectiveness during initial hour after procedure(Ultra-Short Term Relief): 100 %   Local anesthetic used: Long-acting (4-6 hours) Effectiveness: Defined as  any analgesic benefit obtained secondary to the administration of local anesthetics. This carries significant diagnostic  value as to the etiological location, or anatomical origin, of the pain. Duration of benefit is expected to coincide with the duration of the local anesthetic used.  Effectiveness during initial 4-6 hours after procedure(Short-Term Relief): 100 %   Long-term benefit: Defined as any relief past the pharmacologic duration of the local anesthetics.  Effectiveness past the initial 6 hours after procedure(Long-Term Relief): 75 %   Current benefits: Defined as benefit that persist at this time.   Analgesia:  >75% relief Function: Shannon Benitez reports improvement in function ROM: Shannon Benitez reports improvement in ROM  Pharmacotherapy Assessment  Analgesic: Oxycodone IR 5 mg, 1 tab PO BID (10 mg/day of oxycodone) (has enough to last until 08/22/2019) MME/day:15mg /day.   Monitoring: Pharmacotherapy: No side-effects or adverse reactions reported. [place] PMP: PDMP reviewed during this encounter.       Compliance: No problems identified. Effectiveness: Clinically acceptable. Plan: Refer to "POC".  UDS:  Summary  Date Value Ref Range Status  11/27/2018 FINAL  Final    Comment:    ==================================================================== TOXAS[facility] COMP DRUG ANALYSIS,UR ==================================================================== Test                             Result       Flag       Units Drug Present and Declared for Prescription Verification   7-aminoclonazepam              319          EXPECTED   ng/mg creat    7-aminoclonazepam is an expected metabolite of clonazepam. Source    of clonazepam is a scheduled prescription medication.   Primidone                      PRESENT      EXPECTED   Phenobarbital                  PRESENT      EXPECTED    Phenobarbital is an expected metabolite of primidone;    Phenobarbital may also be administered as a  prescription drug.   Gabapentin                     PRESENT      EXPECTED   Lamotrigine                    PRESENT      EXPECTED   Baclofen                       PRESENT      EXPECTED   Duloxetine                     PRESENT      EXPECTED   Propranolol                    PRESENT      EXPECTED Drug Present not Declared for Prescription Verification   Metaxalone                     PRESENT      UNEXPECTED   Acetaminophen                  PRESENT      UNEXPECTED   Diphenhydramine  PRESENT      UNEXPECTED ==================================================================== Test                      Result    Flag   Units      Ref Range   Creatinine              47               mg/dL      >=20 ==================================================================== Declared Medications:  The flagging and interpretation on this report are based on the  following declared medications.  Unexpected results may arise from  inaccuracies in the declared medications.  **Note: The testing scope of this panel includes these medications:  Baclofen (Lioresal)  Clonazepam (Klonopin)  Duloxetine (Cymbalta)  Gabapentin (Neurontin)  Lamotrigine (Lamictal)  Primidone (Mysoline)  Propranolol (Inderal)  **Note: The testing scope of this panel does not include following  reported medications:  Dimethyl Fumarate  Metformin (Glucophage)  Mirabegron (Myrbetriq)  Omeprazole (Prilosec)  Sitagliptin (Januvia) ==================================================================== For clinical consultation, please call [phone]. ====================================================================    Laboratory Chemistry Profile (12 mo)  Renal: 11/27/2018: BUN 10; BUN/Creatinine Ratio 13; Creatinine, Ser 0.79; GFR calc Af Amer 102; GFR calc non Af Amer 89  Hepatic: 08/18/2018: ALT 35 11/27/2018: Albumin 4.1; AST 17 Other: 11/27/2018: 25-Hydroxy, Vitamin D 55; 25-Hydroxy, Vitamin D-2 <1.0;  25-Hydroxy, Vitamin D-3 55; CRP 10; Sed Rate 42; Vitamin B-12 372 Note: Above Lab results reviewed.  Imaging  Last 90 days:  Dg Pain Clinic C-arm 1-60 Min No Report  Result Date: 05/21/2019 Fluoro was used, but no Radiologist interpretation will be provided. Please refer to "NOTES" tab for provider progress note.  Last Hospital Admission:  Dg Pain Clinic C-arm 1-60 Min No Report  Result Date: 05/21/2019 Fluoro was used, but no Radiologist interpretation will be provided. Please refer to "NOTES" tab for provider progress note.  Assessment  The primary encounter diagnosis was Chronic pain syndrome. 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  Plan of Care  I have discontinued Shanikia Weygandt's Cholecalciferol, HYDROcodone-acetaminophen, and HYDROcodone-acetaminophen. I am also having her maintain her propranolol, omeprazole, lamoTRIgine, DULoxetine, mirabegron ER, loperamide, aspirin-acetaminophen-caffeine, primidone, Accu-Chek Softclix Lancets, metFORMIN, sitaGLIPtin, oxyCODONE, pregabalin, baclofen, meloxicam, oxyCODONE, and oxyCODONE.  Pharmacotherapy (Medications Ordered): No orders of the defined types were placed in this encounter.  Orders:  No orders of the defined types were placed in this encounter.  Follow-up plan:   Return for scheduled appointment.      Interventional management options: Considering:   Continue encouraging the patient to bring her BMI below 35. Diagnostic bilateral T11-12 lumbar facet block #1  Therapeutic right-sided lumbar facet RFA #1    Palliative PRN treatment(s):   Palliative bilateral lumbar facet blocks  Palliative left-sided lumbar facet RFA #2 (last done on 04/30/2019)      Recent Visits Date Type Provider Dept  06/09/19 Office Visit Milinda Pointer, MD [facility]  05/21/19 Procedure visit Milinda Pointer, MD [facility]  05/20/19 Office Visit Milinda Pointer, MD [facility]  04/30/19 Procedure visit Milinda Pointer, MD [facility]  04/22/19 Office Visit Milinda Pointer, MD [facility]  03/25/19 Office Visit Milinda Pointer, MD [facility]  Showing recent visits within past 90 days and meeting all other requirements   Future Appointments Date Type  Provider Dept  08/19/19 Appointment Milinda Pointer, MD [facility]  Showing future appointments within next 90 days and meeting all other requirements   I discussed the assessment and treatment plan with the patient. The patient was provided an opportunity to ask questions and all were answered. The patient agreed with the plan and demonstrated an understanding of the instructions.  Patient advised to call back or seek an in-person evaluation if the symptoms or condition worsens.  Total duration of non-face-to-face encounter: 12 minutes.  Note by: [name], MD Date: 06/09/2019; Time: 6:20 PM  Note: This dictation was prepared with Dragon dictation. Any transcriptional errors that may result from this process are unintentional.  Disclaimer:  * Given the special circumstances of the COVID-19 pandemic, the federal government has announced that the Office for Civil Rights (OCR) will exercise its enforcement discretion and will not impose penalties on physicians using telehealth in the event of noncompliance with regulatory requirements under the [place] (HIPAA) in connection with the good faith provision of telehealth during the GYIRS-85 national public health emergency. ([place])

## 2019-07-06 ENCOUNTER — Telehealth: Payer: Self-pay

## 2019-07-06 NOTE — Telephone Encounter (Signed)
She is out of meloxicam and wants to know if it can be called out. Dr. Dossie Arbour doesn't have any appointments available for a couple of weeks.

## 2019-07-06 NOTE — Telephone Encounter (Signed)
Patient notified that she has 2 refills on her Meloxicam.

## 2019-07-27 ENCOUNTER — Encounter: Payer: Self-pay | Admitting: Pain Medicine

## 2019-08-13 ENCOUNTER — Encounter: Payer: Self-pay | Admitting: Pain Medicine

## 2019-08-16 NOTE — Progress Notes (Signed)
Pain Management Virtual Encounter Note - Virtual Visit via Telephone Telehealth (real-time audio visits between healthcare provider and patient).   Patient's Phone No. & Preferred Pharmacy:  [phone] (home); [phone] (mobile); (Preferred) [phone] [email or web address]  [place], [place] [address] Phone: [phone] Fax: [phone]    Pre-screening note:  Our staff contacted Shannon Benitez and offered her an "in person", "face-to-face" appointment versus a telephone encounter. She indicated preferring the telephone encounter, at this time.   Reason for Virtual Visit: COVID-19*  Social distancing based on CDC and AMA recommendations.   I contacted Shannon Benitez on 08/17/2019 via telephone.      I clearly identified myself as [name], MD. I verified that I was speaking with the correct person using two identifiers (Name: Shannon Benitez, and date of birth: 1971/04/25).  Advanced Informed Consent I sought verbal advanced consent from [facility] for virtual visit interactions. I informed Shannon Benitez of possible security and privacy concerns, risks, and limitations associated with providing "not-in-person" medical evaluation and management services. I also informed Shannon Benitez of the availability of "in-person" appointments. Finally, I informed her that there would be a charge for the virtual visit and that she could be  personally, fully or partially, financially responsible for it. Shannon Benitez expressed understanding and agreed to proceed.   Historic Elements   Shannon Benitez is a 48 y.o. year old, female patient evaluated today after her last encounter by our practice on 07/06/2019. Shannon Benitez  has a past medical history of Acute postoperative pain (04/30/2019), Diabetes mellitus without complication ([place]), GERD (gastroesophageal reflux disease), Headache, MS (multiple sclerosis)  ([place]), Numbness and tingling of both legs below knees, Osteoarthritis, Vertigo, and Wears dentures. She also  has a past surgical history that includes Abdominal hysterectomy; Cesarean section; Tumor removal (Left); Cataract extraction w/PHACO (Right, 03/18/2019); and Cataract extraction w/PHACO (Left, 04/13/2019). Shannon Benitez has a current medication list which includes the following prescription(s): accu-chek softclix lancets, aspirin-acetaminophen-caffeine, baclofen, duloxetine, lamotrigine, loperamide, meloxicam, metformin, mirabegron er, oxycodone, oxycodone, oxycodone, pregabalin, primidone, propranolol, sitagliptin, and omeprazole. She  reports that she quit smoking about 2 years ago. Her smoking use included cigarettes. She has a 9.00 pack-year smoking history. She has quit using smokeless tobacco. She reports that she does not drink alcohol. No history on file for drug. Shannon Benitez is allergic to glatiramer; rebif [interferon beta-1a]; and latex.   HPI  Today, she is being contacted for medication management.  The patient indicates doing well with the current medication regimen. No adverse reactions or side effects reported to the medications.  Unfortunately, the patient indicates that she recently had some MRIs done at [facility] for follow-up on her multiple sclerosis.  As it turns out, because she has some involuntary movements, this caused MRIs to last a lot longer since some images had to be repeated.  I went over some of the results of the thoracic and cervical MRI with her, but I suggested that she go over the MRI with the ordering physician so that they can talk about the MS and some of the findings including some cysts located around some of her cervical nerve roots.  In any case, she indicates that this MRI (07/22/2019) study lasted over 2 hours where she had to lay on her back, which she normally does not do.  After that, then she had a flareup of her lower back pain.  This has caused some of  the  benefits that she had from the radiofrequency ablation to have subsided and she is again having some pain.  Because this is an acute exacerbation, I will go ahead and bring her in for a palliative bilateral lumbar facet block under fluoroscopic guidance and IV sedation with the hopes that this will allow for the benefits of the radiofrequency to again kick in and help her with this pain.  The cervical MRI revealed a small disc bulge at C3-4 resulting in mild canal stenosis. Small bilateral perineural cysts C6-7, C7-T1, and T1-2.  While the thoracic MRI revealed evidence of right subarticular disc protrusion at T7-8.   She is very thankful for the benefit that she has obtained from our treatments and she already knows that she wants to repeat her radiofrequency ablation, once it wears off since it did provide her with significant benefit that I allowed her to have less discomfort and be more active to the point where she lost a significant amount of weight due to the fact that she could move better and do more.  Pharmacotherapy Assessment  Analgesic: Oxycodone IR 5 mg, 1 tab PO BID (10 mg/day of oxycodone)  MME/day:15mg /day.   Monitoring: Pharmacotherapy: No side-effects or adverse reactions reported. [place] PMP: PDMP reviewed during this encounter.       Compliance: No problems identified. Effectiveness: Clinically acceptable. Plan: Refer to "POC".  UDS:  Summary  Date Value Ref Range Status  11/27/2018 FINAL  Final    Comment:    ==================================================================== TOXAS[facility]RE COMP DRUG ANALYSIS,UR ==================================================================== Test                             Result       Flag       Units Drug Present and Declared for Prescription Verification   7-aminoclonazepam              319          EXPECTED   ng/mg creat    7-aminoclonazepam is an expected metabolite of clonazepam. Source    of clonazepam is a scheduled  prescription medication.   Primidone                      PRESENT      EXPECTED   Phenobarbital                  PRESENT      EXPECTED    Phenobarbital is an expected metabolite of primidone;    Phenobarbital may also be administered as a prescription drug.   Gabapentin                     PRESENT      EXPECTED   Lamotrigine                    PRESENT      EXPECTED   Baclofen                       PRESENT      EXPECTED   Duloxetine                     PRESENT      EXPECTED   Propranolol                    PRESENT      EXPECTED Drug  Present not Declared for Prescription Verification   Metaxalone                     PRESENT      UNEXPECTED   Acetaminophen                  PRESENT      UNEXPECTED   Diphenhydramine                PRESENT      UNEXPECTED ==================================================================== Test                      Result    Flag   Units      Ref Range   Creatinine              47               mg/dL      >=20 ==================================================================== Declared Medications:  The flagging and interpretation on this report are based on the  following declared medications.  Unexpected results may arise from  inaccuracies in the declared medications.  **Note: The testing scope of this panel includes these medications:  Baclofen (Lioresal)  Clonazepam (Klonopin)  Duloxetine (Cymbalta)  Gabapentin (Neurontin)  Lamotrigine (Lamictal)  Primidone (Mysoline)  Propranolol (Inderal)  **Note: The testing scope of this panel does not include following  reported medications:  Dimethyl Fumarate  Metformin (Glucophage)  Mirabegron (Myrbetriq)  Omeprazole (Prilosec)  Sitagliptin (Januvia) ==================================================================== For clinical consultation, please call [phone]. ====================================================================    Laboratory Chemistry Profile (12 mo)  Renal: 11/27/2018:  BUN 10; BUN/Creatinine Ratio 13; Creatinine, Ser 0.79  Lab Results  Component Value Date   GFRAA 102 11/27/2018   GFRNONAA 89 11/27/2018   Hepatic: 11/27/2018: Albumin 4.1 Lab Results  Component Value Date   AST 17 11/27/2018   ALT 35 08/18/2018   Other: 11/27/2018: 25-Hydroxy, Vitamin D 55; 25-Hydroxy, Vitamin D-2 <1.0; 25-Hydroxy, Vitamin D-3 55; CRP 10; Sed Rate 42; Vitamin B-12 372 Note: Above Lab results reviewed.  Imaging  Last 90 days:  [facility] C-arm 1-60 Min No Report  Result Date: 05/21/2019 Fluoro was used, but no Radiologist interpretation will be provided. Please refer to "NOTES" tab for provider progress note.   Assessment  The primary encounter diagnosis was Chronic pain syndrome. 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  Plan of Care  I am having Shannon Benitez start on oxyCODONE and oxyCODONE. I am also having her maintain her propranolol, omeprazole, lamoTRIgine, DULoxetine, mirabegron ER, loperamide, aspirin-acetaminophen-caffeine, primidone, Accu-Chek Softclix Lancets, metFORMIN, sitaGLIPtin, meloxicam, baclofen, pregabalin, and oxyCODONE.  Pharmacotherapy (Medications Ordered): Meds ordered this encounter  Medications  . meloxicam (MOBIC) 15 MG tablet    Sig: Take 1 tablet (15 mg total) by mouth daily.    Dispense:  30 tablet    Refill:  5    Fill one day early if pharmacy is closed on scheduled refill date. May substitute for generic if available.  . baclofen  (LIORESAL) 20 MG tablet    Sig: Take 1 tablet (20 mg total) by mouth 4 (four)  times daily.    Dispense:  120 tablet    Refill:  5    Fill one day early if pharmacy is closed on scheduled refill date. May substitute for generic if available.  . pregabalin (LYRICA) 150 MG capsule    Sig: Take 1 capsule (150 mg total) by mouth 3 (three) times daily.    Dispense:  90 capsule    Refill:  5    Fill one day early if pharmacy is closed on scheduled refill date. May substitute for generic if available.  Marland Kitchen oxyCODONE (OXY IR/ROXICODONE) 5 MG immediate release tablet    Sig: Take 1 tablet (5 mg total) by mouth 2 (two) times daily as needed for severe pain. Must last 30 days.    Dispense:  60 tablet    Refill:  0    Chronic Pain: STOP Act (Not applicable) Fill 1 day early if closed on refill date. Do not fill until: 08/22/2019. To last until: 09/21/2019. Avoid benzodiazepines within 8 hours of opioids  . oxyCODONE (OXY IR/ROXICODONE) 5 MG immediate release tablet    Sig: Take 1 tablet (5 mg total) by mouth 2 (two) times daily as needed for severe pain. Must last 30 days.    Dispense:  60 tablet    Refill:  0    Chronic Pain: STOP Act (Not applicable) Fill 1 day early if closed on refill date. Do not fill until: 09/21/2019. To last until: 10/21/2019. Avoid benzodiazepines within 8 hours of opioids  . oxyCODONE (OXY IR/ROXICODONE) 5 MG immediate release tablet    Sig: Take 1 tablet (5 mg total) by mouth 2 (two) times daily as needed for severe pain. Must last 30 days.    Dispense:  60 tablet    Refill:  0    Chronic Pain: STOP Act (Not applicable) Fill 1 day early if closed on refill date. Do not fill until: 10/21/2019. To last until: 11/20/2019. Avoid benzodiazepines within 8 hours of opioids   Orders:  Orders Placed This Encounter  Procedures  . LUMBAR FACET(MEDIAL BRANCH NERVE BLOCK) MBNB    Standing Status:   Future    Standing Expiration Date:   09/17/2019    Scheduling Instructions:     Side:  Bilateral     Level: L3-4, L4-5, & L5-S1 Facets (L2, L3, L4, L5, & S1 Medial Branch Nerves)     Sedation: Patient's choice.     Timeframe: ASAA    Order Specific Question:   Where will this procedure be performed?    Answer:   [facility]   Follow-up plan:   Return for Procedure (w/ sedation): (B) L-FCT Blk #3, (ASAP).      Interventional management options: Considering:   Continue encouraging the patient to bring her BMI below 35. Diagnostic bilateral T11-12 lumbar facet block #1  Diagnostic right T7-8 TESI #1  Diagnostic right T7 TFESI #1  Diagnostic ML CESI #1    Palliative PRN treatment(s):   Palliative bilateral lumbar facet blocks  Palliative left lumbar facet RFA #2 (last done on 04/30/2019)  Palliative right lumbar facet RFA #2 (last done on 05/21/2019)     Recent Visits Date Type Provider Dept  05/21/19 Procedure visit Milinda Pointer, MD [facility]  05/20/19 Office Visit Milinda Pointer, MD [facility]  Showing recent visits within past 90 days and meeting all other requirements   Today's Visits Date Type Provider Dept  08/17/19 Office Visit Milinda Pointer, MD [facility]  Showing  today's visits and meeting all other requirements   Future Appointments No visits were found meeting these conditions.  Showing future appointments within next 90 days and meeting all other requirements   I discussed the assessment and treatment plan with the patient. The patient was provided an opportunity to ask questions and all were answered. The patient agreed with the plan and demonstrated an understanding of the instructions.  Patient advised to call back or seek an in-person evaluation if the symptoms or condition worsens.  Total duration of non-face-to-face encounter: 20 minutes.  Note by: [name], MD Date: 08/17/2019; Time: 10:58 AM  Note: This dictation was prepared with Dragon dictation. Any transcriptional  errors that may result from this process are unintentional.  Disclaimer:  * Given the special circumstances of the COVID-19 pandemic, the federal government has announced that the Office for Civil Rights (OCR) will exercise its enforcement discretion and will not impose penalties on physicians using telehealth in the event of noncompliance with regulatory requirements under the [place] and [place] (HIPAA) in connection with the good faith provision of telehealth during the XX123456 national public health emergency. ([place])

## 2019-08-17 ENCOUNTER — Other Ambulatory Visit: Payer: Self-pay

## 2019-08-17 ENCOUNTER — Ambulatory Visit: Payer: Medicaid Other | Attending: Pain Medicine | Admitting: Pain Medicine

## 2019-08-17 ENCOUNTER — Encounter: Payer: Self-pay | Admitting: Pain Medicine

## 2019-08-17 DIAGNOSIS — M792 Neuralgia and neuritis, unspecified: Secondary | ICD-10-CM

## 2019-08-17 DIAGNOSIS — M5442 Lumbago with sciatica, left side: Secondary | ICD-10-CM

## 2019-08-17 DIAGNOSIS — M79605 Pain in left leg: Secondary | ICD-10-CM

## 2019-08-17 DIAGNOSIS — G8929 Other chronic pain: Secondary | ICD-10-CM

## 2019-08-17 DIAGNOSIS — M5104 Intervertebral disc disorders with myelopathy, thoracic region: Secondary | ICD-10-CM

## 2019-08-17 DIAGNOSIS — R937 Abnormal findings on diagnostic imaging of other parts of musculoskeletal system: Secondary | ICD-10-CM | POA: Insufficient documentation

## 2019-08-17 DIAGNOSIS — M47816 Spondylosis without myelopathy or radiculopathy, lumbar region: Secondary | ICD-10-CM | POA: Diagnosis not present

## 2019-08-17 DIAGNOSIS — M79604 Pain in right leg: Secondary | ICD-10-CM | POA: Diagnosis not present

## 2019-08-17 DIAGNOSIS — M5441 Lumbago with sciatica, right side: Secondary | ICD-10-CM

## 2019-08-17 DIAGNOSIS — G894 Chronic pain syndrome: Secondary | ICD-10-CM | POA: Diagnosis not present

## 2019-08-17 DIAGNOSIS — G35 Multiple sclerosis: Secondary | ICD-10-CM

## 2019-08-17 DIAGNOSIS — M62838 Other muscle spasm: Secondary | ICD-10-CM

## 2019-08-17 DIAGNOSIS — E1142 Type 2 diabetes mellitus with diabetic polyneuropathy: Secondary | ICD-10-CM

## 2019-08-17 MED ORDER — BACLOFEN 20 MG PO TABS: 20.0000 mg | ORAL_TABLET | Freq: Four times a day (QID) | ORAL | 5 refills | Status: DC

## 2019-08-17 MED ORDER — MELOXICAM 15 MG PO TABS: 15.0000 mg | ORAL_TABLET | Freq: Every day | ORAL | 5 refills | Status: DC

## 2019-08-17 MED ORDER — PREGABALIN 150 MG PO CAPS: 150.0000 mg | ORAL_CAPSULE | Freq: Three times a day (TID) | ORAL | 5 refills | Status: DC

## 2019-08-17 MED ORDER — OXYCODONE HCL 5 MG PO TABS: 5.0000 mg | ORAL_TABLET | Freq: Two times a day (BID) | ORAL | 0 refills | Status: DC | PRN

## 2019-08-17 NOTE — Patient Instructions (Signed)

## 2019-08-19 ENCOUNTER — Ambulatory Visit: Payer: Medicaid Other | Admitting: Pain Medicine

## 2019-08-20 ENCOUNTER — Telehealth: Payer: Self-pay | Admitting: *Deleted

## 2019-08-20 NOTE — Telephone Encounter (Signed)
Patient states she is just sore.  Instructed to go to get xrays if she felt needed.

## 2019-08-25 ENCOUNTER — Ambulatory Visit
Admission: RE | Admit: 2019-08-25 | Discharge: 2019-08-25 | Disposition: A | Discharge status: Home / Self Care | Payer: Medicaid Other | Source: Ambulatory Visit | Attending: Pain Medicine | Admitting: Pain Medicine

## 2019-08-25 ENCOUNTER — Other Ambulatory Visit: Payer: Self-pay

## 2019-08-25 ENCOUNTER — Encounter: Payer: Self-pay | Admitting: Pain Medicine

## 2019-08-25 ENCOUNTER — Ambulatory Visit (HOSPITAL_BASED_OUTPATIENT_CLINIC_OR_DEPARTMENT_OTHER): Payer: Medicaid Other | Admitting: Pain Medicine

## 2019-08-25 VITALS — BP 132/73 | HR 69 | Temp 98.0°F | Resp 16 | Ht 61.0 in | Wt 180.0 lb

## 2019-08-25 DIAGNOSIS — M5137 Other intervertebral disc degeneration, lumbosacral region: Secondary | ICD-10-CM | POA: Diagnosis present

## 2019-08-25 DIAGNOSIS — M47816 Spondylosis without myelopathy or radiculopathy, lumbar region: Secondary | ICD-10-CM | POA: Diagnosis present

## 2019-08-25 DIAGNOSIS — G8929 Other chronic pain: Secondary | ICD-10-CM

## 2019-08-25 DIAGNOSIS — M545 Low back pain: Secondary | ICD-10-CM | POA: Diagnosis present

## 2019-08-25 DIAGNOSIS — M47817 Spondylosis without myelopathy or radiculopathy, lumbosacral region: Secondary | ICD-10-CM | POA: Insufficient documentation

## 2019-08-25 DIAGNOSIS — M51379 Other intervertebral disc degeneration, lumbosacral region without mention of lumbar back pain or lower extremity pain: Secondary | ICD-10-CM

## 2019-08-25 MED ORDER — LACTATED RINGERS IV SOLN
1000.0000 mL | Freq: Once | INTRAVENOUS | Status: AC
  Administered 2019-08-25: 14:00:00 1000 mL via INTRAVENOUS

## 2019-08-25 MED ORDER — FENTANYL CITRATE (PF) 100 MCG/2ML IJ SOLN
25.0000 ug | INTRAMUSCULAR | Status: DC | PRN
  Administered 2019-08-25: 14:00:00 100 ug via INTRAVENOUS
  Filled 2019-08-25: qty 2

## 2019-08-25 MED ORDER — TRIAMCINOLONE ACETONIDE 40 MG/ML IJ SUSP
80.0000 mg | Freq: Once | INTRAMUSCULAR | Status: AC
  Administered 2019-08-25: 14:00:00 80 mg
  Filled 2019-08-25: qty 2

## 2019-08-25 MED ORDER — LIDOCAINE HCL 2 % IJ SOLN
20.0000 mL | Freq: Once | INTRAMUSCULAR | Status: AC
  Administered 2019-08-25: 400 mg
  Filled 2019-08-25: qty 40

## 2019-08-25 MED ORDER — MIDAZOLAM HCL 5 MG/5ML IJ SOLN
1.0000 mg | INTRAMUSCULAR | Status: DC | PRN
  Administered 2019-08-25: 14:00:00 3 mg via INTRAVENOUS
  Filled 2019-08-25: qty 5

## 2019-08-25 MED ORDER — ROPIVACAINE HCL 2 MG/ML IJ SOLN
18.0000 mL | Freq: Once | INTRAMUSCULAR | Status: AC
  Administered 2019-08-25: 14:00:00 18 mL via PERINEURAL
  Filled 2019-08-25: qty 20

## 2019-08-25 NOTE — Patient Instructions (Addendum)

## 2019-08-25 NOTE — Progress Notes (Signed)
Safety precautions to be maintained throughout the outpatient stay will include: orient to surroundings, keep bed in low position, maintain call bell within reach at all times, provide assistance with transfer out of bed and ambulation.  

## 2019-08-25 NOTE — Progress Notes (Signed)
Patient's Name: Shannon Benitez  MRN: [id number]  Referring Provider: Marygrace Drought, MD  DOB: 05-24-1971  PCP: Marygrace Drought, MD  DOS: 08/25/2019  Note by: [name], MD  Service setting: Ambulatory outpatient  Specialty: Interventional Pain Management  Patient type: Established  Location: [facility] (AMB) Pain Management Facility  Visit type: Interventional Procedure   Primary Reason for Visit: Interventional Pain Management Treatment. CC: Back Pain (low)  Procedure:          Anesthesia, Analgesia, Anxiolysis:  Type: Lumbar Facet, Medial Branch Block(s) #3  Primary Purpose: Diagnostic Region: Posterolateral Lumbosacral Spine Level: L2, L3, L4, L5, & S1 Medial Branch Level(s). Injecting these levels blocks the L3-4, L4-5, and L5-S1 lumbar facet joints. Laterality: Bilateral  Type: Moderate (Conscious) Sedation combined with Local Anesthesia Indication(s): Analgesia and Anxiety Route: Intravenous (IV) IV Access: Secured Sedation: Meaningful verbal contact was maintained at all times during the procedure  Local Anesthetic: Lidocaine 1-2%  Position: Prone   Indications: 1. Lumbar facet syndrome (Bilateral) (L>R)   2. Lumbar facet hypertrophy (Multilevel) (Bilateral)   3. Spondylosis without myelopathy or radiculopathy, lumbosacral region   4. Osteoarthritis of lumbar spine   5. Osteoarthritis of facet joint of lumbar spine   6. DDD (degenerative disc disease), lumbosacral   7. Chronic low back pain (Bilateral) (L>R) w/o sciatica    Pain Score: Pre-procedure: 8 /10 Post-procedure: 0-No pain/10   Pre-op Assessment:  Shannon Benitez is a 48 y.o. (year old), female patient, seen today for interventional treatment. She  has a past surgical history that includes Abdominal hysterectomy; Cesarean section; Tumor removal (Left); Cataract extraction w/PHACO (Right, 03/18/2019); and Cataract extraction w/PHACO (Left, 04/13/2019). Shannon Benitez has a current medication list which  includes the following prescription(s): accu-chek softclix lancets, aspirin-acetaminophen-caffeine, baclofen, dimethyl fumarate, duloxetine, lamotrigine, loperamide, meloxicam, metformin, mirabegron er, omeprazole, oxycodone, oxycodone, oxycodone, pregabalin, primidone, propranolol, and sitagliptin, and the following Facility-Administered Medications: fentanyl and midazolam. Her primarily concern today is the Back Pain (low)  Initial Vital Signs:  Pulse/HCG Rate: 69ECG Heart Rate: 68 Temp: 98.1 F (36.7 C) Resp: 16 BP: 110/78 SpO2: 98 %  BMI: Estimated body mass index is 34.01 kg/m as calculated from the following:   Height as of this encounter: 5\' 1"  (1.549 m).   Weight as of this encounter: 180 lb (81.6 kg).  Risk Assessment: Allergies: Reviewed. She is allergic to 5-alpha reductase inhibitors; glatiramer; rebif [interferon beta-1a]; and latex.  Allergy Precautions: None required Coagulopathies: Reviewed. None identified.  Blood-thinner therapy: None at this time Active Infection(s): Reviewed. None identified. Shannon Benitez is afebrile  Site Confirmation: Shannon Benitez was asked to confirm the procedure and laterality before marking the site Procedure checklist: Completed Consent: Before the procedure and under the influence of no sedative(s), amnesic(s), or anxiolytics, the patient was informed of the treatment options, risks and possible complications. To fulfill our ethical and legal obligations, as recommended by the American Medical Association's Code of Ethics, I have informed the patient of my clinical impression; the nature and purpose of the treatment or procedure; the risks, benefits, and possible complications of the intervention; the alternatives, including doing nothing; the risk(s) and benefit(s) of the alternative treatment(s) or procedure(s); and the risk(s) and benefit(s) of doing nothing. The patient was provided information about the general risks and possible  complications associated with the procedure. These may include, but are not limited to: failure to achieve desired goals, infection, bleeding, organ or nerve damage, allergic reactions, paralysis, and death. In addition, the patient was informed  of those risks and complications associated to Spine-related procedures, such as failure to decrease pain; infection (i.e.: Meningitis, epidural or intraspinal abscess); bleeding (i.e.: epidural hematoma, subarachnoid hemorrhage, or any other type of intraspinal or peri-dural bleeding); organ or nerve damage (i.e.: Any type of peripheral nerve, nerve root, or spinal cord injury) with subsequent damage to sensory, motor, and/or autonomic systems, resulting in permanent pain, numbness, and/or weakness of one or several areas of the body; allergic reactions; (i.e.: anaphylactic reaction); and/or death. Furthermore, the patient was informed of those risks and complications associated with the medications. These include, but are not limited to: allergic reactions (i.e.: anaphylactic or anaphylactoid reaction(s)); adrenal axis suppression; blood sugar elevation that in diabetics may result in ketoacidosis or comma; water retention that in patients with history of congestive heart failure may result in shortness of breath, pulmonary edema, and decompensation with resultant heart failure; weight gain; swelling or edema; medication-induced neural toxicity; particulate matter embolism and blood vessel occlusion with resultant organ, and/or nervous system infarction; and/or aseptic necrosis of one or more joints. Finally, the patient was informed that Medicine is not an exact science; therefore, there is also the possibility of unforeseen or unpredictable risks and/or possible complications that may result in a catastrophic outcome. The patient indicated having understood very clearly. We have given the patient no guarantees and we have made no promises. Enough time was given to the  patient to ask questions, all of which were answered to the patient's satisfaction. Shannon Benitez has indicated that she wanted to continue with the procedure. Attestation: I, the ordering provider, attest that I have discussed with the patient the benefits, risks, side-effects, alternatives, likelihood of achieving goals, and potential problems during recovery for the procedure that I have provided informed consent. Date   Time: 08/25/2019  1:23 PM  Pre-Procedure Preparation:  Monitoring: As per clinic protocol. Respiration, ETCO2, SpO2, BP, heart rate and rhythm monitor placed and checked for adequate function Safety Precautions: Patient was assessed for positional comfort and pressure points before starting the procedure. Time-out: I initiated and conducted the "Time-out" before starting the procedure, as per protocol. The patient was asked to participate by confirming the accuracy of the "Time Out" information. Verification of the correct person, site, and procedure were performed and confirmed by me, the nursing staff, and the patient. "Time-out" conducted as per Joint Commission's Universal Protocol (UP.01.01.01). Time: 1406  Description of Procedure:          Laterality: Bilateral. The procedure was performed in identical fashion on both sides. Levels:  L2, L3, L4, L5, & S1 Medial Branch Level(s) Area Prepped: Posterior Lumbosacral Region Prepping solution: DuraPrep (Iodine Povacrylex [0.7% available iodine] and Isopropyl Alcohol, 74% w/w) Safety Precautions: Aspiration looking for blood return was conducted prior to all injections. At no point did we inject any substances, as a needle was being advanced. Before injecting, the patient was told to immediately notify me if she was experiencing any new onset of "ringing in the ears, or metallic taste in the mouth". No attempts were made at seeking any paresthesias. Safe injection practices and needle disposal techniques used. Medications  properly checked for expiration dates. SDV (single dose vial) medications used. After the completion of the procedure, all disposable equipment used was discarded in the proper designated medical waste containers. Local Anesthesia: Protocol guidelines were followed. The patient was positioned over the fluoroscopy table. The area was prepped in the usual manner. The time-out was completed. The target area was identified using fluoroscopy.  A 12-in long, straight, sterile hemostat was used with fluoroscopic guidance to locate the targets for each level blocked. Once located, the skin was marked with an approved surgical skin marker. Once all sites were marked, the skin (epidermis, dermis, and hypodermis), as well as deeper tissues (fat, connective tissue and muscle) were infiltrated with a small amount of a short-acting local anesthetic, loaded on a 10cc syringe with a 25G, 1.5-in  Needle. An appropriate amount of time was allowed for local anesthetics to take effect before proceeding to the next step. Local Anesthetic: Lidocaine 2.0% The unused portion of the local anesthetic was discarded in the proper designated containers. Technical explanation of process:  L2 Medial Branch Nerve Block (MBB): The target area for the L2 medial branch is at the junction of the postero-lateral aspect of the superior articular process and the superior, posterior, and medial edge of the transverse process of L3. Under fluoroscopic guidance, a Quincke needle was inserted until contact was made with os over the superior postero-lateral aspect of the pedicular shadow (target area). After negative aspiration for blood, 0.5 mL of the nerve block solution was injected without difficulty or complication. The needle was removed intact. L3 Medial Branch Nerve Block (MBB): The target area for the L3 medial branch is at the junction of the postero-lateral aspect of the superior articular process and the superior, posterior, and medial edge of  the transverse process of L4. Under fluoroscopic guidance, a Quincke needle was inserted until contact was made with os over the superior postero-lateral aspect of the pedicular shadow (target area). After negative aspiration for blood, 0.5 mL of the nerve block solution was injected without difficulty or complication. The needle was removed intact. L4 Medial Branch Nerve Block (MBB): The target area for the L4 medial branch is at the junction of the postero-lateral aspect of the superior articular process and the superior, posterior, and medial edge of the transverse process of L5. Under fluoroscopic guidance, a Quincke needle was inserted until contact was made with os over the superior postero-lateral aspect of the pedicular shadow (target area). After negative aspiration for blood, 0.5 mL of the nerve block solution was injected without difficulty or complication. The needle was removed intact. L5 Medial Branch Nerve Block (MBB): The target area for the L5 medial branch is at the junction of the postero-lateral aspect of the superior articular process and the superior, posterior, and medial edge of the sacral ala. Under fluoroscopic guidance, a Quincke needle was inserted until contact was made with os over the superior postero-lateral aspect of the pedicular shadow (target area). After negative aspiration for blood, 0.5 mL of the nerve block solution was injected without difficulty or complication. The needle was removed intact. S1 Medial Branch Nerve Block (MBB): The target area for the S1 medial branch is at the posterior and inferior 6 o'clock position of the L5-S1 facet joint. Under fluoroscopic guidance, the Quincke needle inserted for the L5 MBB was redirected until contact was made with os over the inferior and postero aspect of the sacrum, at the 6 o' clock position under the L5-S1 facet joint (Target area). After negative aspiration for blood, 0.5 mL of the nerve block solution was injected without  difficulty or complication. The needle was removed intact.  Nerve block solution: 0.2% PF-Ropivacaine + Triamcinolone (40 mg/mL) diluted to a final concentration of 4 mg of Triamcinolone/mL of Ropivacaine The unused portion of the solution was discarded in the proper designated containers. Procedural Needles: 22-gauge, 3.5-inch, Quincke  needles used for all levels.  Once the entire procedure was completed, the treated area was cleaned, making sure to leave some of the prepping solution back to take advantage of its long term bactericidal properties.   Illustration of the posterior view of the lumbar spine and the posterior neural structures. Laminae of L2 through S1 are labeled. DPRL5, dorsal primary ramus of L5; DPRS1, dorsal primary ramus of S1; DPR3, dorsal primary ramus of L3; FJ, facet (zygapophyseal) joint L3-L4; I, inferior articular process of L4; LB1, lateral branch of dorsal primary ramus of L1; IAB, inferior articular branches from L3 medial branch (supplies L4-L5 facet joint); IBP, intermediate branch plexus; MB3, medial branch of dorsal primary ramus of L3; NR3, third lumbar nerve root; S, superior articular process of L5; SAB, superior articular branches from L4 (supplies L4-5 facet joint also); TP3, transverse process of L3.  Vitals:   08/25/19 1415 08/25/19 1422 08/25/19 1435 08/25/19 1445  BP: 125/76 121/66 122/60 132/73  Pulse:      Resp: 12 14 14 16   Temp:  98 F (36.7 C)    SpO2: 95% 98% 98% 96%  Weight:      Height:         Start Time: 1406 hrs. End Time: 1415 hrs.  Imaging Guidance (Spinal):          Type of Imaging Technique: Fluoroscopy Guidance (Spinal) Indication(s): Assistance in needle guidance and placement for procedures requiring needle placement in or near specific anatomical locations not easily accessible without such assistance. Exposure Time: Please see nurses notes. Contrast: None used. Fluoroscopic Guidance: I was personally present during the use  of fluoroscopy. "Tunnel Vision Technique" used to obtain the best possible view of the target area. Parallax error corrected before commencing the procedure. "Direction-depth-direction" technique used to introduce the needle under continuous pulsed fluoroscopy. Once target was reached, antero-posterior, oblique, and lateral fluoroscopic projection used confirm needle placement in all planes. Images permanently stored in EMR. Interpretation: No contrast injected. I personally interpreted the imaging intraoperatively. Adequate needle placement confirmed in multiple planes. Permanent images saved into the patient's record.  Antibiotic Prophylaxis:   Anti-infectives (From admission, onward)   None     Indication(s): None identified  Post-operative Assessment:  Post-procedure Vital Signs:  Pulse/HCG Rate: 6967 Temp: 98 F (36.7 C) Resp: 16 BP: 132/73 SpO2: 96 %  EBL: None  Complications: No immediate post-treatment complications observed by team, or reported by patient.  Note: The patient tolerated the entire procedure well. A repeat set of vitals were taken after the procedure and the patient was kept under observation following institutional policy, for this type of procedure. Post-procedural neurological assessment was performed, showing return to baseline, prior to discharge. The patient was provided with post-procedure discharge instructions, including a section on how to identify potential problems. Should any problems arise concerning this procedure, the patient was given instructions to immediately contact us, at any time, without hesitation. In any case, we plan to contact the patient by telephone for a follow-up status report regarding this interventional procedure.  Comments:  No additional relevant information.  Plan of Care  Orders:  Orders Placed This Encounter  Procedures   LUMBAR FACET(MEDIAL BRANCH NERVE BLOCK) MBNB    Scheduling Instructions:     Procedure: Lumbar  facet block (AKA.: Lumbosacral medial branch nerve block)     Side: Bilateral     Level: L3-4, L4-5, & L5-S1 Facets (L2, L3, L4, L5, & S1 Medial Branch Nerves)  Sedation: Patient's choice.     Timeframe: Today    Order Specific Question:   Where will this procedure be performed?    Answer:   [facility]   DG PAIN [facility] C-ARM 1-60 MIN NO REPORT    Intraoperative interpretation by procedural physician at [place].    Standing Status:   Standing    Number of Occurrences:   1    Order Specific Question:   Reason for exam:    Answer:   Assistance in needle guidance and placement for procedures requiring needle placement in or near specific anatomical locations not easily accessible without such assistance.   Informed Consent Details: Physician/Practitioner Attestation; Transcribe to consent form and obtain patient signature    Nursing Order: Transcribe to consent form and obtain patient signature. Note: Always confirm laterality of pain with Ms. Ates, before procedure. Procedure: Lumbar Facet Block  under fluoroscopic guidance Indication/Reason: Low Back Pain, with our without leg pain, due to Facet Joint Arthralgia (Joint Pain) known as Lumbar Facet Syndrome, secondary to Lumbar, and/or Lumbosacral Spondylosis (Arthritis of the Spine), without myelopathy or radiculopathy (Nerve Damage). Provider Attestation: I, Tipton Dossie Arbour, MD, (Pain Management Specialist), the physician/practitioner, attest that I have discussed with the patient the benefits, risks, side effects, alternatives, likelihood of achieving goals and potential problems during recovery for the procedure that I have provided informed consent.   Provide equipment / supplies at bedside    Equipment required: Single use, disposable, "Block Tray"    Standing Status:   Standing    Number of Occurrences:   1    Order Specific Question:   Specify    Answer:   Block Tray   Chronic Opioid Analgesic:   Oxycodone IR 5 mg, 1 tab PO BID (10 mg/day of oxycodone)  MME/day:15mg /day.   Medications ordered for procedure: Meds ordered this encounter  Medications   lidocaine (XYLOCAINE) 2 % (with pres) injection 400 mg   lactated ringers infusion 1,000 mL   midazolam (VERSED) 5 MG/5ML injection 1-2 mg    Make sure Flumazenil is available in the pyxis when using this medication. If oversedation occurs, administer 0.2 mg IV over 15 sec. If after 45 sec no response, administer 0.2 mg again over 1 min; may repeat at 1 min intervals; not to exceed 4 doses (1 mg)   fentaNYL (SUBLIMAZE) injection 25-50 mcg    Make sure Narcan is available in the pyxis when using this medication. In the event of respiratory depression (RR< 8/min): Titrate NARCAN (naloxone) in increments of 0.1 to 0.2 mg IV at 2-3 minute intervals, until desired degree of reversal.   ropivacaine (PF) 2 mg/mL (0.2%) (NAROPIN) injection 18 mL   triamcinolone acetonide (KENALOG-40) injection 80 mg   Medications administered: We administered lidocaine, lactated ringers, midazolam, fentaNYL, ropivacaine (PF) 2 mg/mL (0.2%), and triamcinolone acetonide.  See the medical record for exact dosing, route, and time of administration.  Follow-up plan:   Return in about 2 weeks (around 09/08/2019) for (VV), (PP).       Interventional management options: Considering:   Continue encouraging the patient to bring her BMI below 35. Diagnostic bilateral T11-12 lumbar facet block #1  Diagnostic right T7-8 TESI #1  Diagnostic right T7 TFESI #1  Diagnostic ML CESI #1    Palliative PRN treatment(s):   Palliative bilateral lumbar facet blocks  Palliative left lumbar facet RFA #2 (last done on 04/30/2019)  Palliative right lumbar facet RFA #2 (last done on 05/21/2019)  Recent Visits Date Type Provider Dept  08/17/19 Office Visit Milinda Pointer, MD [facility]  Showing recent visits within past 90 days and meeting all other  requirements   Today's Visits Date Type Provider Dept  08/25/19 Procedure visit Milinda Pointer, MD [facility]  Showing today's visits and meeting all other requirements   Future Appointments Date Type Provider Dept  09/14/19 Appointment Milinda Pointer, MD [facility]  11/16/19 Appointment Milinda Pointer, MD [facility]  Showing future appointments within next 90 days and meeting all other requirements   Disposition: Discharge home  Discharge Date & Time: 08/25/2019; 1450 hrs.   Primary Care Physician: Marygrace Drought, MD Location: [facility] Note by: [name], MD Date: 08/25/2019; Time: 4:29 PM  Disclaimer:  Medicine is not an Chief Strategy Officer. The only guarantee in medicine is that nothing is guaranteed. It is important to note that the decision to proceed with this intervention was based on the information collected from the patient. The Data and conclusions were drawn from the patient's questionnaire, the interview, and the physical examination. Because the information was provided in large part by the patient, it cannot be guaranteed that it has not been purposely or unconsciously manipulated. Every effort has been made to obtain as much relevant data as possible for this evaluation. It is important to note that the conclusions that lead to this procedure are derived in large part from the available data. Always take into account that the treatment will also be dependent on availability of resources and existing treatment guidelines, considered by other Pain Management Practitioners as being common knowledge and practice, at the time of the intervention. For Medico-Legal purposes, it is also important to point out that variation in procedural techniques and pharmacological choices are the acceptable norm. The indications, contraindications, technique, and results of the above procedure should only be  interpreted and judged by a Board-Certified Interventional Pain Specialist with extensive familiarity and expertise in the same exact procedure and technique.

## 2019-08-26 ENCOUNTER — Telehealth: Payer: Self-pay

## 2019-08-26 NOTE — Telephone Encounter (Signed)
Post procedure phone call. Patient states she is doing good.  

## 2019-09-13 NOTE — Progress Notes (Signed)
Pain Management Virtual Encounter Note - Virtual Visit via Telephone Telehealth (real-time audio visits between healthcare provider and patient).   Patient's Phone No. & Preferred Pharmacy:  [phone] (home); [phone] (mobile); (Preferred) [phone] [email or web address]  [place], [place] [address] Phone: [phone] Fax: [phone]    Pre-screening note:  Our staff contacted Shannon Benitez and offered her an "in person", "face-to-face" appointment versus a telephone encounter. She indicated preferring the telephone encounter, at this time.   Reason for Virtual Visit: COVID-19*  Social distancing based on CDC and AMA recommendations.   I contacted Shannon Benitez on 09/14/2019 via telephone.      I clearly identified myself as [name], MD. I verified that I was speaking with the correct person using two identifiers (Name: Shannon Benitez, and date of birth: 11/05/1971).  Advanced Informed Consent I sought verbal advanced consent from Shannon Benitez for virtual visit interactions. I informed Shannon Benitez of possible security and privacy concerns, risks, and limitations associated with providing "not-in-person" medical evaluation and management services. I also informed Shannon Benitez of the availability of "in-person" appointments. Finally, I informed her that there would be a charge for the virtual visit and that she could be  personally, fully or partially, financially responsible for it. Shannon Benitez expressed understanding and agreed to proceed.   Historic Elements   Shannon Benitez is a 48 y.o. year old, female patient evaluated today after her last encounter by our practice on 08/26/2019. Shannon Benitez  has a past medical history of Acute postoperative pain (04/30/2019), Diabetes mellitus without complication ([place]), GERD (gastroesophageal reflux disease), Headache, MS (multiple sclerosis)  ([place]), Numbness and tingling of both legs below knees, Osteoarthritis, Vertigo, and Wears dentures. She also  has a past surgical history that includes Abdominal hysterectomy; Cesarean section; Tumor removal (Left); Cataract extraction w/PHACO (Right, 03/18/2019); and Cataract extraction w/PHACO (Left, 04/13/2019). Shannon Benitez has a current medication list which includes the following prescription(s): accu-chek softclix lancets, aspirin-acetaminophen-caffeine, baclofen, dimethyl fumarate, duloxetine, lamotrigine, loperamide, meloxicam, metformin, mirabegron er, omeprazole, oxycodone, oxycodone, oxycodone, pregabalin, primidone, propranolol, and sitagliptin. She  reports that she quit smoking about 2 years ago. Her smoking use included cigarettes. She has a 9.00 pack-year smoking history. She has quit using smokeless tobacco. She reports that she does not drink alcohol. No history on file for drug. Shannon Benitez is allergic to 5-alpha reductase inhibitors; glatiramer; rebif [interferon beta-1a]; and latex.   HPI  Today, she is being contacted for both, medication management and a post-procedure assessment.  The patient indicates doing well with the current medication regimen. No adverse reactions or side effects reported to the medications.   The patient returns today after having had her third diagnostic bilateral lumbar facet block under fluoroscopic guidance and IV sedation.  Once again, she has attained 100% relief of the pain for the duration of the local anesthetic followed by complete relief for 2 days which then later started wearing off and that this point she still remains with 50% improvement of her pain.  However, she indicates that although the pain is completely gone on the left side, she still having pain on the right side.  The patient has already tried physical therapy for her lower back and [place].  She indicates that while doing that, she was experiencing more pain after she had each  session, which is typically compatible with facet joint problems.  She has already tried less aggressive means  of managing this and at this point it is clear to me that we need to move on with radiofrequency ablation.  Since the right side seems to be the worst at this time, we will go ahead and start with that one.  Medical Necessity: Shannon Benitez has experienced debilitating chronic pain from the Lumbosacral Facet Syndrome (Spondylosis without myelopathy or radiculopathy, lumbosacral region [M47.817]) that has persisted for longer than three months of failed non-surgical care and has either failed to respond, or was unable to tolerate, or simply did not get enough benefit from other more conservative therapies including, but not limited to: 1. Over-the-counter oral analgesic medications (i.e.: ibuprofen, naproxen, etc.) 2. Anti-inflammatory medications 3. Muscle relaxants 4. Membrane stabilizers 5. Opioids 6. Physical therapy (PT), chiropractic manipulation, and/or home exercise program (HEP). 7. Modalities (Heat, ice, etc.) 8. Invasive techniques such as nerve blocks.  Shannon Benitez has attained greater than 50% reduction in pain from at least two (2) diagnostic medial branch blocks conducted in separate occasions. For this reason, I believe it is medically necessary to proceed with Non-Pulsed Radiofrequency Ablation for the purpose of attempting to prolong the duration of the benefits seen with the diagnostic injections.  This patient has already had lumbar facet radiofrequency ablation, the last time we did the right side was on 05/21/2019.  Apparently she attained only partial relief and therefore we have decided to repeat it.  Post-Procedure Evaluation  Procedure: Diagnostic bilateral lumbar facet block #3 under fluoroscopic guidance and IV sedation Pre-procedure pain level: 8/10 Post-procedure: 0/10 (100% relief)  Sedation: Sedation provided.  Pain relief after procedure (treated  area only): (Questions asked to patient) 1. Starting about 15 minutes after the procedure, and "while the area was still numb" (from the local anesthetics), were you having any of your usual pain "in that area"? (NOT including the discomfort from the needle sticks.) First 1 hour: 100 % better. First 4-6 hours: 100 % better. 2. Assuming that it did get numb. How long was the area numb? 100 % benefit, longer than 6 hours. How long? 2 days. 3. How much better is your pain now, when compared to before the procedure? Current benefit: 50 % better. 4. Can you move better now? Improvement in ROM (Range of Motion): Yes. 5. Can you do more now? Improvement in function: Yes. 4. Did you have any problems with the procedure? Side-effects/Complications: No.  Current benefits: Defined as benefit that persist at this time.   Analgesia:  50% improved Function: Shannon Benitez reports improvement in function ROM: Shannon Benitez reports improvement in ROM  Pharmacotherapy Assessment  Analgesic: Oxycodone IR 5 mg, 1 tab PO BID (10 mg/day of oxycodone)  MME/day:15mg /day.   Monitoring: Pharmacotherapy: No side-effects or adverse reactions reported. [place] PMP: PDMP reviewed during this encounter.       Compliance: No problems identified. Effectiveness: Clinically acceptable. Plan: Refer to "POC".  UDS:  Summary  Date Value Ref Range Status  11/27/2018 FINAL  Final    Comment:    ==================================================================== [facility] COMP DRUG ANALYSIS,UR ==================================================================== Test                             Result       Flag       Units Drug Present and Declared for Prescription Verification   7-aminoclonazepam              319          EXPECTED  ng/mg creat    7-aminoclonazepam is an expected metabolite of clonazepam. Source    of clonazepam is a scheduled prescription medication.   Primidone                      PRESENT       EXPECTED   Phenobarbital                  PRESENT      EXPECTED    Phenobarbital is an expected metabolite of primidone;    Phenobarbital may also be administered as a prescription drug.   Gabapentin                     PRESENT      EXPECTED   Lamotrigine                    PRESENT      EXPECTED   Baclofen                       PRESENT      EXPECTED   Duloxetine                     PRESENT      EXPECTED   Propranolol                    PRESENT      EXPECTED Drug Present not Declared for Prescription Verification   Metaxalone                     PRESENT      UNEXPECTED   Acetaminophen                  PRESENT      UNEXPECTED   Diphenhydramine                PRESENT      UNEXPECTED ==================================================================== Test                      Result    Flag   Units      Ref Range   Creatinine              47               mg/dL      >=20 ==================================================================== Declared Medications:  The flagging and interpretation on this report are based on the  following declared medications.  Unexpected results may arise from  inaccuracies in the declared medications.  **Note: The testing scope of this panel includes these medications:  Baclofen (Lioresal)  Clonazepam (Klonopin)  Duloxetine (Cymbalta)  Gabapentin (Neurontin)  Lamotrigine (Lamictal)  Primidone (Mysoline)  Propranolol (Inderal)  **Note: The testing scope of this panel does not include following  reported medications:  Dimethyl Fumarate  Metformin (Glucophage)  Mirabegron (Myrbetriq)  Omeprazole (Prilosec)  Sitagliptin (Januvia) ==================================================================== For clinical consultation, please call [phone]. ====================================================================    Laboratory Chemistry Profile (12 mo)  Renal: 11/27/2018: BUN 10; BUN/Creatinine Ratio 13; Creatinine, Ser 0.79  Lab Results   Component Value Date   GFRAA 102 11/27/2018   GFRNONAA 89 11/27/2018   Hepatic: 11/27/2018: Albumin 4.1 Lab Results  Component Value Date   AST 17 11/27/2018   ALT 35 08/18/2018   Other: 11/27/2018: 25-Hydroxy, Vitamin D 55; 25-Hydroxy, Vitamin D-2 <1.0; 25-Hydroxy, Vitamin D-3 55; CRP 10; Sed Rate  42; Vitamin B-12 372 Note: Above Lab results reviewed.  Imaging  Last 90 days:  Dg Pain Clinic C-arm 1-60 Min No Report  Result Date: 08/25/2019 Fluoro was used, but no Radiologist interpretation will be provided. Please refer to "NOTES" tab for provider progress note.   Assessment  The primary encounter diagnosis was Lumbar facet hypertrophy (Multilevel) (Bilateral). Diagnoses of Lumbar facet syndrome (Bilateral) (R>L), Osteoarthritis of facet joint of lumbar spine, Spondylosis without myelopathy or radiculopathy, lumbosacral region, Osteoarthritis of lumbar spine, and Chronic pain syndrome were also pertinent to this visit.  Plan of Care  I am having Shannon Benitez maintain her propranolol, omeprazole, lamoTRIgine, DULoxetine, mirabegron ER, loperamide, aspirin-acetaminophen-caffeine, primidone, Accu-Chek Softclix Lancets, metFORMIN, sitaGLIPtin, meloxicam, baclofen, pregabalin, oxyCODONE, oxyCODONE, Dimethyl Fumarate, and oxyCODONE.  Pharmacotherapy (Medications Ordered): Meds ordered this encounter  Medications  . oxyCODONE (OXY IR/ROXICODONE) 5 MG immediate release tablet    Sig: Take 1 tablet (5 mg total) by mouth 2 (two) times daily as needed for severe pain. Must last 30 days.    Dispense:  60 tablet    Refill:  0    Chronic Pain: STOP Act (Not applicable) Fill 1 day early if closed on refill date. Do not fill until: 11/20/2019. To last until: 12/20/2019. Avoid benzodiazepines within 8 hours of opioids   Orders:  Orders Placed This Encounter  Procedures  . RFA - Lumbar Facet (Schedule)    Standing Status:   Future    Standing Expiration Date:   03/13/2021    Scheduling  Instructions:     Side(s): Right-sided     Level: L3-4, L4-5, & L5-S1 Facets (L2, L3, L4, L5, & S1 Medial Branch Nerves)     Sedation: With Sedation     Scheduling Timeframe: As soon as pre-approved    Order Specific Question:   Where will this procedure be performed?    Answer:   [facility]   Follow-up plan:   Return in about 3 months (around 12/16/2019) for (VV), (MM), in addition, RFA (w/ sedation): (R) L-FCT RFA #1.      Interventional management options: Considering:   Continue encouraging the patient to bring her BMI below 35. Repeat right-sided lumbar facet RFA #2  Diagnostic bilateral T11-12 lumbar facet block #1  Diagnostic right T7-8 TESI #1  Diagnostic right T7 TFESI #1  Diagnostic ML CESI #1    Palliative PRN treatment(s):   Palliative bilateral lumbar facet block #4  Palliative left lumbar facet RFA #2 (last done on 04/30/2019)  Palliative right lumbar facet RFA #2 (last done on 05/21/2019)     Recent Visits Date Type Provider Dept  08/25/19 Procedure visit Milinda Pointer, MD [facility]  08/17/19 Office Visit Milinda Pointer, MD [facility]  Showing recent visits within past 90 days and meeting all other requirements   Today's Visits Date Type Provider Dept  09/14/19 Telemedicine Milinda Pointer, MD [facility]  Showing today's visits and meeting all other requirements   Future Appointments Date Type Provider Dept  11/16/19 Appointment Milinda Pointer, MD [facility]  Showing future appointments within next 90 days and meeting all other requirements   I discussed the assessment and treatment plan with the patient. The patient was provided an opportunity to ask questions and all were answered. The patient agreed with the plan and demonstrated an understanding of the instructions.  Patient advised to call back or seek an in-person evaluation if the symptoms or condition worsens.  Total duration of  non-face-to-face encounter: 16 minutes.  Note by: [name], MD Date: 09/14/2019; Time: 5:15 PM  Note: This dictation was prepared with Dragon dictation. Any transcriptional errors that may result from this process are unintentional.  Disclaimer:  * Given the special circumstances of the COVID-19 pandemic, the federal government has announced that the Office for Civil Rights (OCR) will exercise its enforcement discretion and will not impose penalties on physicians using telehealth in the event of noncompliance with regulatory requirements under the [place] (HIPAA) in connection with the good faith provision of telehealth during the XX123456 national public health emergency. ([place])

## 2019-09-14 ENCOUNTER — Ambulatory Visit: Payer: Medicaid Other | Attending: Pain Medicine | Admitting: Pain Medicine

## 2019-09-14 ENCOUNTER — Other Ambulatory Visit: Payer: Self-pay

## 2019-09-14 DIAGNOSIS — G894 Chronic pain syndrome: Secondary | ICD-10-CM

## 2019-09-14 DIAGNOSIS — M47816 Spondylosis without myelopathy or radiculopathy, lumbar region: Secondary | ICD-10-CM | POA: Diagnosis not present

## 2019-09-14 DIAGNOSIS — M47817 Spondylosis without myelopathy or radiculopathy, lumbosacral region: Secondary | ICD-10-CM | POA: Diagnosis not present

## 2019-09-14 MED ORDER — OXYCODONE HCL 5 MG PO TABS: 5.0000 mg | ORAL_TABLET | Freq: Two times a day (BID) | ORAL | 0 refills | Status: DC | PRN

## 2019-09-14 NOTE — Progress Notes (Signed)
Pain relief after procedure (treated area only): (Questions asked to patient) 1. Starting about 15 minutes after the procedure, and "while the area was still numb" (from the local anesthetics), were you having any of your usual pain "in that area"? (NOT including the discomfort from the needle sticks.) First 1 hour: 100 % better. First 4-6 hours: 100 % better. 2. Assuming that it did get numb. How long was the area numb? 100 % benefit, longer than 6 hours. How long? 2 days. 3. How much better is your pain now, when compared to before the procedure? Current benefit: 50 % better. 4. Can you move better now? Improvement in ROM (Range of Motion): Yes. 5. Can you do more now? Improvement in function: Yes. 4. Did you have any problems with the procedure? Side-effects/Complications: No.

## 2019-09-25 ENCOUNTER — Emergency Department: Payer: Medicaid Other

## 2019-09-25 ENCOUNTER — Emergency Department
Admission: EM | Admit: 2019-09-25 | Discharge: 2019-09-25 | Disposition: A | Discharge status: Home / Self Care | Payer: Medicaid Other | Attending: Student | Admitting: Student

## 2019-09-25 ENCOUNTER — Other Ambulatory Visit: Payer: Self-pay

## 2019-09-25 ENCOUNTER — Encounter: Payer: Self-pay | Admitting: Emergency Medicine

## 2019-09-25 DIAGNOSIS — R0602 Shortness of breath: Secondary | ICD-10-CM | POA: Diagnosis present

## 2019-09-25 DIAGNOSIS — Z91040 Latex allergy status: Secondary | ICD-10-CM | POA: Diagnosis not present

## 2019-09-25 DIAGNOSIS — G35 Multiple sclerosis: Secondary | ICD-10-CM | POA: Diagnosis not present

## 2019-09-25 DIAGNOSIS — E119 Type 2 diabetes mellitus without complications: Secondary | ICD-10-CM | POA: Diagnosis not present

## 2019-09-25 DIAGNOSIS — Z79899 Other long term (current) drug therapy: Secondary | ICD-10-CM | POA: Diagnosis not present

## 2019-09-25 DIAGNOSIS — R0789 Other chest pain: Secondary | ICD-10-CM | POA: Diagnosis not present

## 2019-09-25 DIAGNOSIS — R071 Chest pain on breathing: Secondary | ICD-10-CM | POA: Diagnosis not present

## 2019-09-25 DIAGNOSIS — Z7984 Long term (current) use of oral hypoglycemic drugs: Secondary | ICD-10-CM | POA: Diagnosis not present

## 2019-09-25 DIAGNOSIS — R0781 Pleurodynia: Secondary | ICD-10-CM

## 2019-09-25 LAB — CBC WITH DIFFERENTIAL/PLATELET
Abs Immature Granulocytes: 0.04 10*3/uL (ref 0.00–0.07)
Basophils Absolute: 0 10*3/uL (ref 0.0–0.1)
Basophils Relative: 0 %
Eosinophils Absolute: 0.1 10*3/uL (ref 0.0–0.5)
Eosinophils Relative: 1 %
HCT: 36.3 % (ref 36.0–46.0)
Hemoglobin: 11.9 g/dL — ABNORMAL LOW (ref 12.0–15.0)
Immature Granulocytes: 1 %
Lymphocytes Relative: 7 %
Lymphs Abs: 0.6 10*3/uL — ABNORMAL LOW (ref 0.7–4.0)
MCH: 27.1 pg (ref 26.0–34.0)
MCHC: 32.8 g/dL (ref 30.0–36.0)
MCV: 82.7 fL (ref 80.0–100.0)
Monocytes Absolute: 0.5 10*3/uL (ref 0.1–1.0)
Monocytes Relative: 7 %
Neutro Abs: 6.4 10*3/uL (ref 1.7–7.7)
Neutrophils Relative %: 84 %
Platelets: 245 10*3/uL (ref 150–400)
RBC: 4.39 MIL/uL (ref 3.87–5.11)
RDW: 14.6 % (ref 11.5–15.5)
WBC: 7.6 10*3/uL (ref 4.0–10.5)
nRBC: 0 % (ref 0.0–0.2)

## 2019-09-25 LAB — BASIC METABOLIC PANEL
Anion gap: 11 (ref 5–15)
BUN: 13 mg/dL (ref 6–20)
CO2: 24 mmol/L (ref 22–32)
Calcium: 8.8 mg/dL — ABNORMAL LOW (ref 8.9–10.3)
Chloride: 101 mmol/L (ref 98–111)
Creatinine, Ser: 0.53 mg/dL (ref 0.44–1.00)
GFR calc Af Amer: >60 mL/min (ref >60)
GFR calc non Af Amer: >60 mL/min (ref >60)
Glucose, Bld: 123 mg/dL — ABNORMAL HIGH (ref 70–99)
Potassium: 3.8 mmol/L (ref 3.5–5.1)
Sodium: 136 mmol/L (ref 135–145)

## 2019-09-25 LAB — TROPONIN I (HIGH SENSITIVITY): Troponin I (High Sensitivity): 2 ng/L (ref <18)

## 2019-09-25 MED ORDER — SODIUM CHLORIDE 0.9 % IV BOLUS
1000.0000 mL | Freq: Once | INTRAVENOUS | Status: AC
  Administered 2019-09-25: 1000 mL via INTRAVENOUS

## 2019-09-25 MED ORDER — IOHEXOL 350 MG/ML SOLN
75.0000 mL | Freq: Once | INTRAVENOUS | Status: AC | PRN
  Administered 2019-09-25: 75 mL via INTRAVENOUS
  Filled 2019-09-25: qty 75

## 2019-09-25 NOTE — ED Provider Notes (Signed)
[facility] Emergency Department Provider Note  ____________________________________________   First MD Initiated Contact with Patient 09/25/19 1325     (approximate)  I have reviewed the triage vital signs and the nursing notes.  History  Chief Complaint Shortness of Breath    HPI Shannon Benitez is a 48 y.o. female who presents for R sided chest pain. She reports pain when taking a deep breath. She describes it as sharp, "catching" type pain. Moderate in severity. Located at the R anterior lateral chest wall. No radiation. Worse with the deep breaths and palpation. She states she fell into a door 3-4 days ago and hit her right side. She is concerned she may have a rib fracture from this fall versus a PE, as she has a history of this and the pain w/ inspiration feels similar. She denies any cough, hemoptysis, leg swelling, or recent travel. She is not currently on anticoagulation (reportedly had PE in setting of PNA many years ago).    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) 08/17/2019   Abnormal MRI, cervical spine (07/22/2019) 08/17/2019   Diabetic peripheral neuropathy ([place]) 02/23/2019   Morbid obesity with BMI of 40.0-44.9, adult ([place]) 01/19/2019   Spondylosis without myelopathy or radiculopathy, lumbosacral region 01/01/2019   Lumbar facet syndrome (Bilateral) (R>L) 01/01/2019   Abnormal MRI, lumbar spine (07/02/2017)  01/01/2019   Lumbar facet hypertrophy (Multilevel) (Bilateral) 01/01/2019   Osteoarthritis of facet joint of lumbar spine 01/01/2019   Osteoarthritis of lumbar spine 01/01/2019   DDD (degenerative disc disease), lumbosacral 01/01/2019   Lumbar spondylosis 01/01/2019   Chronic low back pain (Bilateral) (L>R) w/o sciatica 12/17/2018   Muscle spasticity 12/17/2018   Chronic pain syndrome 11/27/2018   Chronic low back pain (Primary area of Pain) (Bilateral) (L>R) w/ sciatica (Bilateral) 11/27/2018   Chronic lower extremity pain (Secondary Area of Pain) (Bilateral) (R>L) 11/27/2018   Long term prescription benzodiazepine use 11/27/2018   Pharmacologic therapy 11/27/2018   Disorder of skeletal system 11/27/2018   Problems influencing health status 11/27/2018   Anemia 05/19/2018   Diabetes mellitus type 2 in obese ([place]) 02/19/2018   Anxiety 01/14/2018   Situational depression 01/14/2018   Neurogenic pain 01/14/2018   Vitamin D deficiency 01/14/2018   Medication management 06/10/2017   Sacroiliac pain 01/30/2017   Generalized seizure disorder ([place]) 11/23/2016   Multiple sclerosis ([place]) 11/23/2016   Tremor 11/23/2016    Past Surgical Hx Past Surgical History:  Procedure Laterality Date   ABDOMINAL HYSTERECTOMY     CATARACT EXTRACTION W/PHACO Right 03/18/2019   Procedure: CATARACT EXTRACTION PHACO AND INTRAOCULAR LENS PLACEMENT ([place]) RIGHT;  Surgeon: Eulogio Bear, MD;  Location: [place];  Service: Ophthalmology;  Laterality: Right;  Diabetic - oral meds Latex sensitivity   CATARACT EXTRACTION W/PHACO Left 04/13/2019   Procedure: CATARACT EXTRACTION PHACO AND INTRAOCULAR LENS PLACEMENT (I[facility])  LEFT;  Surgeon: Eulogio Bear, MD;  Location: [place];  Service: Ophthalmology;  Laterality: Left;  Diabetic - oral meds   CESAREAN SECTION     TUMOR REMOVAL Left    pt was 48 years old    Medications Prior to Admission medications     Medication Sig Start Date End Date Taking? Authorizing Provider  Accu-Chek Softclix Lancets lancets 1 each by Other route 2 (two) times a day. 04/03/19 04/02/20  [provider]  aspirin-acetaminophen-caffeine (EXCEDRIN MIGRAINE) [phone] MG tablet Take by mouth every 6 (six) hours as needed for headache.    [provider]  baclofen (LIORESAL) 20 MG tablet Take 1 tablet (20 mg total) by mouth 4 (four) times daily. 08/22/19 02/18/20  Milinda Pointer, MD  Dimethyl Fumarate (TECFIDERA) 240 MG CPDR Take 240 mg by mouth 2 (two) times daily.  08/06/19   [provider]  DULoxetine (CYMBALTA) 60 MG capsule Take 60 mg by mouth daily.    [provider]  lamoTRIgine (LAMICTAL) 25 MG tablet Take 50 mg by mouth 2 (two) times daily. 11/23/16   [provider]  loperamide (IMODIUM A-D) 2 MG tablet Take 2 mg by mouth as needed for diarrhea or loose stools.    [provider]  meloxicam (MOBIC) 15 MG tablet Take 1 tablet (15 mg total) by mouth daily. 08/22/19 02/18/20  Milinda Pointer, MD  metFORMIN (GLUCOPHAGE-XR) 500 MG 24 hr tablet Take 500 mg by mouth daily with breakfast.  04/03/19 04/02/20  [provider]  mirabegron ER (MYRBETRIQ) 50 MG TB24 tablet Take 50 mg by mouth daily.  02/13/19   [provider]  omeprazole (PRILOSEC) 20 MG capsule Take 20 mg by mouth daily. 11/23/16 09/14/19  [provider]  oxyCODONE (OXY IR/ROXICODONE) 5 MG immediate release tablet Take 1 tablet (5 mg total) by mouth 2 (two) times daily as needed for severe pain. Must last 30 days. 09/21/19 10/21/19  Milinda Pointer, MD  oxyCODONE (OXY IR/ROXICODONE) 5 MG immediate release tablet Take 1 tablet (5 mg total) by mouth 2 (two) times daily as needed for severe pain. Must last 30 days. 10/21/19 11/20/19  Milinda Pointer, MD  oxyCODONE (OXY IR/ROXICODONE) 5 MG immediate release tablet Take 1 tablet (5 mg total) by mouth 2 (two) times daily as needed for  severe pain. Must last 30 days. 11/20/19 12/20/19  Milinda Pointer, MD  pregabalin (LYRICA) 150 MG capsule Take 1 capsule (150 mg total) by mouth 3 (three) times daily. 08/22/19 02/18/20  Milinda Pointer, MD  primidone (MYSOLINE) 50 MG tablet Take 50 mg by mouth daily. 03/06/19 03/05/20  [provider]  propranolol (INDERAL) 40 MG tablet Take 40 mg by mouth 3 (three) times daily. 12/05/17   [provider]  sitaGLIPtin (JANUVIA) 100 MG tablet Take 100 mg by mouth daily.  04/03/19 04/02/20  [provider]    Allergies 5-alpha reductase inhibitors, Glatiramer, Rebif [interferon beta-1a], and Latex  Family Hx Family History  Adopted: Yes    Social Hx Social History   Tobacco Use   Smoking status: Former Smoker    Packs/day: 0.50    Years: 18.00    Pack years: 9.00    Types: Cigarettes    Quit date: 12/19/2016    Years since quitting: 2.7   Smokeless tobacco: Former Systems developer  Substance Use Topics   Alcohol use: Never    Frequency: Never   Drug use: Not on file     Review of Systems  Constitutional: Negative for fever, chills. Eyes: Negative for visual changes. ENT: Negative for sore throat. Cardiovascular: + for chest pain. Respiratory: Negative for shortness of breath. Gastrointestinal: Negative for nausea, vomiting.  Genitourinary: Negative for dysuria. Musculoskeletal: Negative for leg swelling. Skin: Negative for rash. Neurological: Negative for for headaches.   Physical Exam  Vital Signs: ED Triage Vitals  Enc Vitals Group     BP 09/25/19 1221 (!) 143/73     Pulse Rate 09/25/19 1221 74     Resp 09/25/19 1221 16     Temp 09/25/19 1221 98.6 F (37 C)     Temp Source 09/25/19 1221 Oral     SpO2 09/25/19 1221 97 %     Weight 09/25/19 1222 180 lb (81.6 kg)     Height 09/25/19 1222 5\' 1"  (1.549 m)     Head Circumference --      Peak Flow --      Pain Score 09/25/19 1221 3     Pain Loc --      Pain Edu? --      Excl. in [place]? --      Constitutional: Alert and oriented.  Head: Normocephalic. Atraumatic. Eyes: Conjunctivae clear. Sclera anicteric. Nose: No congestion. No rhinorrhea. Mouth/Throat: Wearing mask.  Neck: No stridor.   Cardiovascular: Normal rate, regular rhythm. Extremities well perfused. Respiratory: Normal respiratory effort.  Lungs CTAB. Chest: Chest wall stable. No crepitance or step offs. No ecchymosis. No flail chest. R anterior lateral chest wall TTP.  Gastrointestinal: Soft. Non-tender. Non-distended.  Musculoskeletal: No lower extremity edema. No deformities. Neurologic:  Normal speech and language. No gross focal neurologic deficits are appreciated.  Skin: Skin is warm, dry and intact. No rash noted. No ecchymosis.  Psychiatric: Mood and affect are appropriate for situation.  EKG  Personally reviewed.   Rate: 74 Rhythm: sinus Axis: normal Intervals: WNL No STEMI    Radiology  CT: IMPRESSION:  1. Negative examination for pulmonary embolism.  2. No displaced rib fractures.  3. Coronary artery disease.    Procedures  Procedure(s) performed (including critical care):  Procedures   Initial Impression / Assessment and Plan / ED Course  48 y.o. female who presents to the ED for R sided chest pain, as above.  Ddx: rib fracture, contusion, ACS, PE  Will obtain labs, imaging.   CT negative for fracture or PE. EKG w/o ischemic changes. Troponin negative. Given negative work up, will plan for discharge with supportive care (OTC analgesics, lidocaine patches) and PCP follow up. Patient agreeable. Given return precautions.     Final Clinical Impression(s) / ED Diagnosis  Final diagnoses:  Pleuritic chest pain  Right-sided chest wall pain       Note:  This document was prepared using Dragon voice recognition software and may include unintentional dictation errors.   Lilia Pro., MD 09/26/19 1028

## 2019-09-25 NOTE — Discharge Instructions (Addendum)
Thank you for letting us take care of you in the emergency department today.   Please continue to take any regular, prescribed medications.   Use over-the-counter Tylenol and ibuprofen to help with your pain.  Please use as directed on the box.  Please follow up with: - Your primary care doctor to review your ER visit and follow up on your symptoms.   Please return to the ER for any new or worsening symptoms.

## 2019-09-25 NOTE — ED Notes (Signed)
See triage note  Presents with pain to lateral right side of chest    States pain started about 4 days ago   Pain increases with movement

## 2019-09-25 NOTE — ED Triage Notes (Signed)
Pt to ED via POV c/o pain when taking a deep breath. Pt states that she is having some shortness of breath. Pt states that when taking a deep breath, she has a "catching" pain on the right side. Pt states that she fell 3-4 days ago and hit her right side on the door frame but she has hx/o PE and is concerned. Pt is in NAD at this time.

## 2019-09-28 ENCOUNTER — Telehealth: Payer: Self-pay | Admitting: *Deleted

## 2019-09-28 NOTE — Telephone Encounter (Signed)
Left message for patine to call us back.  PA was done and faxed today.

## 2019-10-08 ENCOUNTER — Ambulatory Visit (HOSPITAL_BASED_OUTPATIENT_CLINIC_OR_DEPARTMENT_OTHER): Payer: Medicaid Other | Admitting: Pain Medicine

## 2019-10-08 ENCOUNTER — Encounter: Payer: Self-pay | Admitting: Pain Medicine

## 2019-10-08 ENCOUNTER — Ambulatory Visit
Admission: RE | Admit: 2019-10-08 | Discharge: 2019-10-08 | Disposition: A | Discharge status: Home / Self Care | Payer: Medicaid Other | Source: Ambulatory Visit | Attending: Pain Medicine | Admitting: Pain Medicine

## 2019-10-08 ENCOUNTER — Other Ambulatory Visit: Payer: Self-pay

## 2019-10-08 VITALS — BP 112/68 | HR 70 | Temp 97.5°F | Resp 12 | Ht 61.0 in | Wt 185.0 lb

## 2019-10-08 DIAGNOSIS — M5137 Other intervertebral disc degeneration, lumbosacral region: Secondary | ICD-10-CM | POA: Insufficient documentation

## 2019-10-08 DIAGNOSIS — G8929 Other chronic pain: Secondary | ICD-10-CM

## 2019-10-08 DIAGNOSIS — M47816 Spondylosis without myelopathy or radiculopathy, lumbar region: Secondary | ICD-10-CM | POA: Diagnosis present

## 2019-10-08 DIAGNOSIS — M47817 Spondylosis without myelopathy or radiculopathy, lumbosacral region: Secondary | ICD-10-CM | POA: Diagnosis present

## 2019-10-08 DIAGNOSIS — G8918 Other acute postprocedural pain: Secondary | ICD-10-CM | POA: Diagnosis present

## 2019-10-08 DIAGNOSIS — M545 Low back pain: Secondary | ICD-10-CM

## 2019-10-08 DIAGNOSIS — M51379 Other intervertebral disc degeneration, lumbosacral region without mention of lumbar back pain or lower extremity pain: Secondary | ICD-10-CM

## 2019-10-08 MED ORDER — HYDROCODONE-ACETAMINOPHEN 5-325 MG PO TABS: 1.0000 | ORAL_TABLET | Freq: Four times a day (QID) | ORAL | 0 refills | Status: AC | PRN

## 2019-10-08 MED ORDER — FENTANYL CITRATE (PF) 100 MCG/2ML IJ SOLN
25.0000 ug | INTRAMUSCULAR | Status: DC | PRN
  Administered 2019-10-08: 11:00:00 50 ug via INTRAVENOUS
  Filled 2019-10-08: qty 2

## 2019-10-08 MED ORDER — MIDAZOLAM HCL 5 MG/5ML IJ SOLN
1.0000 mg | INTRAMUSCULAR | Status: DC | PRN
  Administered 2019-10-08: 2 mg via INTRAVENOUS
  Filled 2019-10-08: qty 5

## 2019-10-08 MED ORDER — LACTATED RINGERS IV SOLN
1000.0000 mL | Freq: Once | INTRAVENOUS | Status: AC
  Administered 2019-10-08: 1000 mL via INTRAVENOUS

## 2019-10-08 MED ORDER — LIDOCAINE HCL 2 % IJ SOLN
20.0000 mL | Freq: Once | INTRAMUSCULAR | Status: AC
  Administered 2019-10-08: 400 mg
  Filled 2019-10-08: qty 20

## 2019-10-08 MED ORDER — ROPIVACAINE HCL 2 MG/ML IJ SOLN
9.0000 mL | Freq: Once | INTRAMUSCULAR | Status: AC
  Administered 2019-10-08: 9 mL via PERINEURAL
  Filled 2019-10-08: qty 10

## 2019-10-08 MED ORDER — TRIAMCINOLONE ACETONIDE 40 MG/ML IJ SUSP
40.0000 mg | Freq: Once | INTRAMUSCULAR | Status: AC
  Administered 2019-10-08: 11:00:00 40 mg
  Filled 2019-10-08: qty 1

## 2019-10-08 NOTE — Progress Notes (Signed)
Safety precautions to be maintained throughout the outpatient stay will include: orient to surroundings, keep bed in low position, maintain call bell within reach at all times, provide assistance with transfer out of bed and ambulation.  

## 2019-10-08 NOTE — Patient Instructions (Signed)

## 2019-10-08 NOTE — Progress Notes (Signed)
Patient's Name: Shannon Benitez  MRN: [id number]  Referring Provider: Marygrace Drought, MD  DOB: [birth year]  PCP: Marygrace Drought, MD  DOS: 10/08/2019  Note by: [name], MD  Service setting: Ambulatory outpatient  Specialty: Interventional Pain Management  Patient type: Established  Location: [facility] (AMB) Pain Management Facility  Visit type: Interventional Procedure   Primary Reason for Visit: Interventional Pain Management Treatment. CC: Back Pain (low)  Procedure:          Anesthesia, Analgesia, Anxiolysis:  Type: Thermal Lumbar Facet, Medial Branch Radiofrequency Ablation/Neurotomy  #2  Primary Purpose: Therapeutic Region: Posterolateral Lumbosacral Spine Level: L2, L3, L4, L5, & S1 Medial Branch Level(s). These levels will denervate the L3-4, L4-5, and the L5-S1 lumbar facet joints. Laterality: Right  Type: Moderate (Conscious) Sedation combined with Local Anesthesia Indication(s): Analgesia and Anxiety Route: Intravenous (IV) IV Access: Secured Sedation: Meaningful verbal contact was maintained at all times during the procedure  Local Anesthetic: Lidocaine 1-2%  Position: Prone   Indications: 1. Lumbar facet syndrome (Bilateral) (R>L)   2. Spondylosis without myelopathy or radiculopathy, lumbosacral region   3. Lumbar facet hypertrophy (Multilevel) (Bilateral)   4. DDD (degenerative disc disease), lumbosacral   5. Chronic low back pain (Bilateral) (L>R) w/o sciatica   6. Lumbar spondylosis   7. Osteoarthritis of facet joint of lumbar spine    Ms. Javier has been dealing with the above chronic pain for longer than three months and has either failed to respond, was unable to tolerate, or simply did not get enough benefit from other more conservative therapies including, but not limited to: 1. Over-the-counter medications 2. Anti-inflammatory medications 3. Muscle relaxants 4. Membrane stabilizers 5. Opioids 6. Physical therapy and/or chiropractic  manipulation 7. Modalities (Heat, ice, etc.) 8. Invasive techniques such as nerve blocks. Ms. Hugley has attained more than 50% relief of the pain from a series of diagnostic injections conducted in separate occasions.  Pain Score: Pre-procedure: 6 /10 Post-procedure: 0-No pain/10  Pre-op Assessment:  Ms. Longenecker is a 48 y.o. (year old), female patient, seen today for interventional treatment. She  has a past surgical history that includes Abdominal hysterectomy; Cesarean section; Tumor removal (Left); Cataract extraction w/PHACO (Right, 03/18/2019); and Cataract extraction w/PHACO (Left, 04/13/2019). Ms. Gravett has a current medication list which includes the following prescription(s): accu-chek softclix lancets, aspirin-acetaminophen-caffeine, baclofen, dimethyl fumarate, duloxetine, lamotrigine, loperamide, meloxicam, metformin, mirabegron er, omeprazole, oxycodone, oxycodone, oxycodone, pregabalin, primidone, propranolol, sitagliptin, hydrocodone-acetaminophen, and hydrocodone-acetaminophen, and the following Facility-Administered Medications: fentanyl and midazolam. Her primarily concern today is the Back Pain (low)  Initial Vital Signs:  Pulse/HCG Rate: 76ECG Heart Rate: 70 Temp: (!) 97.2 F (36.2 C) Resp: 18 BP: (!) 127/95 SpO2: 100 %  BMI: Estimated body mass index is 34.96 kg/m as calculated from the following:   Height as of this encounter: 5\' 1"  (1.549 m).   Weight as of this encounter: 185 lb (83.9 kg).  Risk Assessment: Allergies: Reviewed. She is allergic to 5-alpha reductase inhibitors; glatiramer; rebif [interferon beta-1a]; and latex.  Allergy Precautions: None required Coagulopathies: Reviewed. None identified.  Blood-thinner therapy: None at this time Active Infection(s): Reviewed. None identified. Ms. Fonda is afebrile  Site Confirmation: Ms. Hoffer was asked to confirm the procedure and laterality before marking the site Procedure checklist:  Completed Consent: Before the procedure and under the influence of no sedative(s), amnesic(s), or anxiolytics, the patient was informed of the treatment options, risks and possible complications. To fulfill our ethical and legal obligations, as recommended by the American  Medical Association's Code of Ethics, I have informed the patient of my clinical impression; the nature and purpose of the treatment or procedure; the risks, benefits, and possible complications of the intervention; the alternatives, including doing nothing; the risk(s) and benefit(s) of the alternative treatment(s) or procedure(s); and the risk(s) and benefit(s) of doing nothing. The patient was provided information about the general risks and possible complications associated with the procedure. These may include, but are not limited to: failure to achieve desired goals, infection, bleeding, organ or nerve damage, allergic reactions, paralysis, and death. In addition, the patient was informed of those risks and complications associated to Spine-related procedures, such as failure to decrease pain; infection (i.e.: Meningitis, epidural or intraspinal abscess); bleeding (i.e.: epidural hematoma, subarachnoid hemorrhage, or any other type of intraspinal or peri-dural bleeding); organ or nerve damage (i.e.: Any type of peripheral nerve, nerve root, or spinal cord injury) with subsequent damage to sensory, motor, and/or autonomic systems, resulting in permanent pain, numbness, and/or weakness of one or several areas of the body; allergic reactions; (i.e.: anaphylactic reaction); and/or death. Furthermore, the patient was informed of those risks and complications associated with the medications. These include, but are not limited to: allergic reactions (i.e.: anaphylactic or anaphylactoid reaction(s)); adrenal axis suppression; blood sugar elevation that in diabetics may result in ketoacidosis or comma; water retention that in patients with history  of congestive heart failure may result in shortness of breath, pulmonary edema, and decompensation with resultant heart failure; weight gain; swelling or edema; medication-induced neural toxicity; particulate matter embolism and blood vessel occlusion with resultant organ, and/or nervous system infarction; and/or aseptic necrosis of one or more joints. Finally, the patient was informed that Medicine is not an exact science; therefore, there is also the possibility of unforeseen or unpredictable risks and/or possible complications that may result in a catastrophic outcome. The patient indicated having understood very clearly. We have given the patient no guarantees and we have made no promises. Enough time was given to the patient to ask questions, all of which were answered to the patient's satisfaction. Ms. Wahls has indicated that she wanted to continue with the procedure. Attestation: I, the ordering provider, attest that I have discussed with the patient the benefits, risks, side-effects, alternatives, likelihood of achieving goals, and potential problems during recovery for the procedure that I have provided informed consent. Date   Time: 10/08/2019  9:18 AM  Pre-Procedure Preparation:  Monitoring: As per clinic protocol. Respiration, ETCO2, SpO2, BP, heart rate and rhythm monitor placed and checked for adequate function Safety Precautions: Patient was assessed for positional comfort and pressure points before starting the procedure. Time-out: I initiated and conducted the "Time-out" before starting the procedure, as per protocol. The patient was asked to participate by confirming the accuracy of the "Time Out" information. Verification of the correct person, site, and procedure were performed and confirmed by me, the nursing staff, and the patient. "Time-out" conducted as per Joint Commission's Universal Protocol (UP.01.01.01). Time: 1035  Description of Procedure:          Laterality:  Right Levels:  L2, L3, L4, L5, & S1 Medial Branch Level(s), at the L3-4, L4-5, and the L5-S1 lumbar facet joints. Area Prepped: Lumbosacral Prepping solution: DuraPrep (Iodine Povacrylex [0.7% available iodine] and Isopropyl Alcohol, 74% w/w) Safety Precautions: Aspiration looking for blood return was conducted prior to all injections. At no point did we inject any substances, as a needle was being advanced. Before injecting, the patient was told to immediately notify  me if she was experiencing any new onset of "ringing in the ears, or metallic taste in the mouth". No attempts were made at seeking any paresthesias. Safe injection practices and needle disposal techniques used. Medications properly checked for expiration dates. SDV (single dose vial) medications used. After the completion of the procedure, all disposable equipment used was discarded in the proper designated medical waste containers. Local Anesthesia: Protocol guidelines were followed. The patient was positioned over the fluoroscopy table. The area was prepped in the usual manner. The time-out was completed. The target area was identified using fluoroscopy. A 12-in long, straight, sterile hemostat was used with fluoroscopic guidance to locate the targets for each level blocked. Once located, the skin was marked with an approved surgical skin marker. Once all sites were marked, the skin (epidermis, dermis, and hypodermis), as well as deeper tissues (fat, connective tissue and muscle) were infiltrated with a small amount of a short-acting local anesthetic, loaded on a 10cc syringe with a 25G, 1.5-in  Needle. An appropriate amount of time was allowed for local anesthetics to take effect before proceeding to the next step. Local Anesthetic: Lidocaine 2.0% The unused portion of the local anesthetic was discarded in the proper designated containers. Technical explanation of process:  Radiofrequency Ablation (RFA) L2 Medial Branch Nerve RFA: The  target area for the L2 medial branch is at the junction of the postero-lateral aspect of the superior articular process and the superior, posterior, and medial edge of the transverse process of L3. Under fluoroscopic guidance, a Radiofrequency needle was inserted until contact was made with os over the superior postero-lateral aspect of the pedicular shadow (target area). Sensory and motor testing was conducted to properly adjust the position of the needle. Once satisfactory placement of the needle was achieved, the numbing solution was slowly injected after negative aspiration for blood. 2.0 mL of the nerve block solution was injected without difficulty or complication. After waiting for at least 3 minutes, the ablation was performed. Once completed, the needle was removed intact. L3 Medial Branch Nerve RFA: The target area for the L3 medial branch is at the junction of the postero-lateral aspect of the superior articular process and the superior, posterior, and medial edge of the transverse process of L4. Under fluoroscopic guidance, a Radiofrequency needle was inserted until contact was made with os over the superior postero-lateral aspect of the pedicular shadow (target area). Sensory and motor testing was conducted to properly adjust the position of the needle. Once satisfactory placement of the needle was achieved, the numbing solution was slowly injected after negative aspiration for blood. 2.0 mL of the nerve block solution was injected without difficulty or complication. After waiting for at least 3 minutes, the ablation was performed. Once completed, the needle was removed intact. L4 Medial Branch Nerve RFA: The target area for the L4 medial branch is at the junction of the postero-lateral aspect of the superior articular process and the superior, posterior, and medial edge of the transverse process of L5. Under fluoroscopic guidance, a Radiofrequency needle was inserted until contact was made with os  over the superior postero-lateral aspect of the pedicular shadow (target area). Sensory and motor testing was conducted to properly adjust the position of the needle. Once satisfactory placement of the needle was achieved, the numbing solution was slowly injected after negative aspiration for blood. 2.0 mL of the nerve block solution was injected without difficulty or complication. After waiting for at least 3 minutes, the ablation was performed. Once completed, the  needle was removed intact. L5 Medial Branch Nerve RFA: The target area for the L5 medial branch is at the junction of the postero-lateral aspect of the superior articular process of S1 and the superior, posterior, and medial edge of the sacral ala. Under fluoroscopic guidance, a Radiofrequency needle was inserted until contact was made with os over the superior postero-lateral aspect of the pedicular shadow (target area). Sensory and motor testing was conducted to properly adjust the position of the needle. Once satisfactory placement of the needle was achieved, the numbing solution was slowly injected after negative aspiration for blood. 2.0 mL of the nerve block solution was injected without difficulty or complication. After waiting for at least 3 minutes, the ablation was performed. Once completed, the needle was removed intact. S1 Medial Branch Nerve RFA: The target area for the S1 medial branch is located inferior to the junction of the S1 superior articular process and the L5 inferior articular process, posterior, inferior, and lateral to the 6 o'clock position of the L5-S1 facet joint, just superior to the S1 posterior foramen. Under fluoroscopic guidance, the Radiofrequency needle was advanced until contact was made with os over the Target area. Sensory and motor testing was conducted to properly adjust the position of the needle. Once satisfactory placement of the needle was achieved, the numbing solution was slowly injected after negative  aspiration for blood. 2.0 mL of the nerve block solution was injected without difficulty or complication. After waiting for at least 3 minutes, the ablation was performed. Once completed, the needle was removed intact. Radiofrequency lesioning (ablation):  Radiofrequency Generator: NeuroTherm [id number] Sensory Stimulation Parameters: 50 Hz was used to locate & identify the nerve, making sure that the needle was positioned such that there was no sensory stimulation below 0.3 V or above 0.7 V. Motor Stimulation Parameters: 2 Hz was used to evaluate the motor component. Care was taken not to lesion any nerves that demonstrated motor stimulation of the lower extremities at an output of less than 2.5 times that of the sensory threshold, or a maximum of 2.0 V. Lesioning Technique Parameters: Standard Radiofrequency settings. (Not bipolar or pulsed.) Temperature Settings: 80 degrees C Lesioning time: 60 seconds Intra-operative Compliance: Compliant Materials & Medications: Needle(s) (Electrode/Cannula) Type: Teflon-coated, curved tip, Radiofrequency needle(s) Gauge: 20G Length: 15cm Numbing solution: 0.2% PF-Ropivacaine + Triamcinolone (40 mg/mL) diluted to a final concentration of 4 mg of Triamcinolone/mL of Ropivacaine The unused portion of the solution was discarded in the proper designated containers.  Once the entire procedure was completed, the treated area was cleaned, making sure to leave some of the prepping solution back to take advantage of its long term bactericidal properties.  Illustration of the posterior view of the lumbar spine and the posterior neural structures. Laminae of L2 through S1 are labeled. DPRL5, dorsal primary ramus of L5; DPRS1, dorsal primary ramus of S1; DPR3, dorsal primary ramus of L3; FJ, facet (zygapophyseal) joint L3-L4; I, inferior articular process of L4; LB1, lateral branch of dorsal primary ramus of L1; IAB, inferior articular branches from L3 medial branch (supplies  L4-L5 facet joint); IBP, intermediate branch plexus; MB3, medial branch of dorsal primary ramus of L3; NR3, third lumbar nerve root; S, superior articular process of L5; SAB, superior articular branches from L4 (supplies L4-5 facet joint also); TP3, transverse process of L3.  Vitals:   10/08/19 1105 10/08/19 1114 10/08/19 1124 10/08/19 1133  BP: 115/78 107/74 113/74 112/68  Pulse: 70     Resp: 13 12 14  12  Temp:  98 F (36.7 C)  (!) 97.5 F (36.4 C)  TempSrc:  Temporal  Temporal  SpO2: 98% 98% 96% 100%  Weight:      Height:        Start Time: 1035 hrs. End Time: 1105 hrs.  Imaging Guidance (Spinal):          Type of Imaging Technique: Fluoroscopy Guidance (Spinal) Indication(s): Assistance in needle guidance and placement for procedures requiring needle placement in or near specific anatomical locations not easily accessible without such assistance. Exposure Time: Please see nurses notes. Contrast: None used. Fluoroscopic Guidance: I was personally present during the use of fluoroscopy. "Tunnel Vision Technique" used to obtain the best possible view of the target area. Parallax error corrected before commencing the procedure. "Direction-depth-direction" technique used to introduce the needle under continuous pulsed fluoroscopy. Once target was reached, antero-posterior, oblique, and lateral fluoroscopic projection used confirm needle placement in all planes. Images permanently stored in EMR. Interpretation: No contrast injected. I personally interpreted the imaging intraoperatively. Adequate needle placement confirmed in multiple planes. Permanent images saved into the patient's record.  Antibiotic Prophylaxis:   Anti-infectives (From admission, onward)   None     Indication(s): None identified  Post-operative Assessment:  Post-procedure Vital Signs:  Pulse/HCG Rate: 7065 Temp: (!) 97.5 F (36.4 C) Resp: 12 BP: 112/68 SpO2: 100 %  EBL: None  Complications: No immediate  post-treatment complications observed by team, or reported by patient.  Note: The patient tolerated the entire procedure well. A repeat set of vitals were taken after the procedure and the patient was kept under observation following institutional policy, for this type of procedure. Post-procedural neurological assessment was performed, showing return to baseline, prior to discharge. The patient was provided with post-procedure discharge instructions, including a section on how to identify potential problems. Should any problems arise concerning this procedure, the patient was given instructions to immediately contact us, at any time, without hesitation. In any case, we plan to contact the patient by telephone for a follow-up status report regarding this interventional procedure.  Comments:  No additional relevant information.  Plan of Care  Orders:  Orders Placed This Encounter  Procedures   RFA - Lumbar Facet (Today)    Scheduling Instructions:     Side(s): Right-sided     Level: L3-4, L4-5, & L5-S1 Facets (L2, L3, L4, L5, & S1 Medial Branch Nerves)     Sedation: With Sedation     Timeframe: Today    Order Specific Question:   Where will this procedure be performed?    Answer:   [facility]   RFA - Lumbar Facet (Schedule)    Standing Status:   Future    Standing Expiration Date:   04/07/2021    Scheduling Instructions:     Side(s): Left-sided     Level: L3-4, L4-5, & L5-S1 Facets (L2, L3, L4, L5, & S1 Medial Branch Nerves)     Sedation: With Sedation     Scheduling Timeframe: 2 weeks from now    Order Specific Question:   Where will this procedure be performed?    Answer:   [facility]   Fluoro (C-Arm) (<60 min) (No Report)    Intraoperative interpretation by procedural physician at [place].    Standing Status:   Standing    Number of Occurrences:   1    Order Specific Question:   Reason for exam:    Answer:   Assistance in needle guidance and  placement for procedures requiring needle placement in or near specific anatomical locations not easily accessible without such assistance.   Consent: L-FCT (RFA)    Nursing Order: Transcribe to consent form and obtain patient signature. Note: Always confirm laterality of pain with Ms. Stegmaier, before procedure. Procedure: Lumbar Facet Radiofrequency Ablation Indication/Reason: Low Back Pain, with our without leg pain, due to Facet Joint Arthralgia (Joint Pain) known as Lumbar Facet Syndrome, secondary to Lumbar, and/or Lumbosacral Spondylosis (Arthritis of the Spine), without myelopathy or radiculopathy (Nerve Damage). Provider Attestation: I, [place] Dossie Arbour, MD, (Pain Management Specialist), the physician/practitioner, attest that I have discussed with the patient the benefits, risks, side effects, alternatives, likelihood of achieving goals and potential problems during recovery for the procedure that I have provided informed consent.   Radiofrequency Tray    Equipment required: Sterile "Radiofrequency Tray"; Large hemostat (1); Small hemostat (1); Towels (6-8); 4x4 sterile sponge pack (1) Radiofrequency Needle(s): Size: Long Quantity: 5    Standing Status:   Standing    Number of Occurrences:   1    Order Specific Question:   Specify    Answer:   Radiofrequency Tray   Chronic Opioid Analgesic:  Oxycodone IR 5 mg, 1 tab PO BID (10 mg/day of oxycodone)  MME/day:15mg /day.   Medications ordered for procedure: Meds ordered this encounter  Medications   lidocaine (XYLOCAINE) 2 % (with pres) injection 400 mg   lactated ringers infusion 1,000 mL   midazolam (VERSED) 5 MG/5ML injection 1-2 mg    Make sure Flumazenil is available in the pyxis when using this medication. If oversedation occurs, administer 0.2 mg IV over 15 sec. If after 45 sec no response, administer 0.2 mg again over 1 min; may repeat at 1 min intervals; not to exceed 4 doses (1 mg)   fentaNYL (SUBLIMAZE)  injection 25-50 mcg    Make sure Narcan is available in the pyxis when using this medication. In the event of respiratory depression (RR< 8/min): Titrate NARCAN (naloxone) in increments of 0.1 to 0.2 mg IV at 2-3 minute intervals, until desired degree of reversal.   ropivacaine (PF) 2 mg/mL (0.2%) (NAROPIN) injection 9 mL   triamcinolone acetonide (KENALOG-40) injection 40 mg   HYDROcodone-acetaminophen (NORCO/VICODIN) 5-325 MG tablet    Sig: Take 1 tablet by mouth every 6 (six) hours as needed for up to 7 days for severe pain. Must last 7 days.    Dispense:  28 tablet    Refill:  0    For acute post-operative pain. Not to be refilled. Must last 7 days.   HYDROcodone-acetaminophen (NORCO/VICODIN) 5-325 MG tablet    Sig: Take 1 tablet by mouth every 6 (six) hours as needed for up to 7 days for severe pain. Must last 7 days.    Dispense:  28 tablet    Refill:  0    For acute post-operative pain. Not to be refilled.  Must last 7 days.   Medications administered: We administered lidocaine, lactated ringers, midazolam, fentaNYL, ropivacaine (PF) 2 mg/mL (0.2%), and triamcinolone acetonide.  See the medical record for exact dosing, route, and time of administration.  Follow-up plan:   Return in about 2 weeks (around 10/22/2019) for RFA (w/ sedation): (L) L-FCT RFA #2, in addition, (PP), (VV) in 6 wks..       Interventional management options: Considering:   Continue encouraging the patient to bring her BMI below 35. Diagnostic bilateral T11-12 lumbar facet block   Diagnostic right T7-8 TESI   Diagnostic right  T7 TFESI   Diagnostic ML CESI     Palliative PRN treatment(s):   Palliative bilateral lumbar facet block #4  Palliative left lumbar facet RFA #2 (last done on 04/30/2019)  Palliative right lumbar facet RFA #2 (last done on 05/21/2019)     Recent Visits Date Type Provider Dept  09/14/19 Telemedicine Milinda Pointer, MD [facility]  08/25/19 Procedure visit Milinda Pointer, MD [facility]  08/17/19 Office Visit Milinda Pointer, MD [facility]  Showing recent visits within past 90 days and meeting all other requirements   Today's Visits Date Type Provider Dept  10/08/19 Procedure visit Milinda Pointer, MD [facility]  Showing today's visits and meeting all other requirements   Future Appointments Date Type Provider Dept  10/27/19 Appointment Milinda Pointer, MD [facility]  11/19/19 Appointment Milinda Pointer, [facility]  12/16/19 Appointment Milinda Pointer, MD [facility]  Showing future appointments within next 90 days and meeting all other requirements   Disposition: Discharge home  Discharge Date & Time: 10/08/2019; 1141 hrs.   Primary Care Physician: Marygrace Drought, MD Location: [facility] Note by: [name], MD Date: 10/08/2019; Time: 12:51 PM  Disclaimer:  Medicine is not an Chief Strategy Officer. The only guarantee in medicine is that nothing is guaranteed. It is important to note that the decision to proceed with this intervention was based on the information collected from the patient. The Data and conclusions were drawn from the patient's questionnaire, the interview, and the physical examination. Because the information was provided in large part by the patient, it cannot be guaranteed that it has not been purposely or unconsciously manipulated. Every effort has been made to obtain as much relevant data as possible for this evaluation. It is important to note that the conclusions that lead to this procedure are derived in large part from the available data. Always take into account that the treatment will also be dependent on availability of resources and existing treatment guidelines, considered by other Pain Management Practitioners as being common knowledge and practice, at the time of the intervention. For Medico-Legal  purposes, it is also important to point out that variation in procedural techniques and pharmacological choices are the acceptable norm. The indications, contraindications, technique, and results of the above procedure should only be interpreted and judged by a Board-Certified Interventional Pain Specialist with extensive familiarity and expertise in the same exact procedure and technique.

## 2019-10-09 ENCOUNTER — Telehealth: Payer: Self-pay | Admitting: Pain Medicine

## 2019-10-09 NOTE — Telephone Encounter (Signed)
Attempted to call back patient x2. , no answer.  Needed to get more information about meds from patient.

## 2019-10-09 NOTE — Telephone Encounter (Signed)
Patient states she got letter denying her authorization approval. They need further info from Dr. Dossie Arbour, peer to peer.  She did purchase meds as they were supposed to be fill earlier in November. Please let her know status when you call insurance.

## 2019-10-12 NOTE — Telephone Encounter (Signed)
I called [employer]. There was a box left unchecked in the initial PA request. I updated and resent the PA request. Attempted to call patient, message left.

## 2019-10-27 ENCOUNTER — Ambulatory Visit (HOSPITAL_BASED_OUTPATIENT_CLINIC_OR_DEPARTMENT_OTHER): Payer: Medicaid Other | Admitting: Pain Medicine

## 2019-10-27 ENCOUNTER — Ambulatory Visit
Admission: RE | Admit: 2019-10-27 | Discharge: 2019-10-27 | Disposition: A | Discharge status: Home / Self Care | Payer: Medicaid Other | Source: Ambulatory Visit | Attending: Pain Medicine | Admitting: Pain Medicine

## 2019-10-27 ENCOUNTER — Other Ambulatory Visit: Payer: Self-pay

## 2019-10-27 ENCOUNTER — Encounter: Payer: Self-pay | Admitting: Pain Medicine

## 2019-10-27 VITALS — BP 110/73 | HR 74 | Temp 97.2°F | Resp 16 | Ht 61.0 in | Wt 185.0 lb

## 2019-10-27 DIAGNOSIS — M545 Low back pain, unspecified: Secondary | ICD-10-CM

## 2019-10-27 DIAGNOSIS — G894 Chronic pain syndrome: Secondary | ICD-10-CM | POA: Insufficient documentation

## 2019-10-27 DIAGNOSIS — G8929 Other chronic pain: Secondary | ICD-10-CM | POA: Diagnosis present

## 2019-10-27 DIAGNOSIS — M5137 Other intervertebral disc degeneration, lumbosacral region: Secondary | ICD-10-CM | POA: Insufficient documentation

## 2019-10-27 DIAGNOSIS — M47817 Spondylosis without myelopathy or radiculopathy, lumbosacral region: Secondary | ICD-10-CM | POA: Diagnosis not present

## 2019-10-27 DIAGNOSIS — M47816 Spondylosis without myelopathy or radiculopathy, lumbar region: Secondary | ICD-10-CM | POA: Insufficient documentation

## 2019-10-27 DIAGNOSIS — G8918 Other acute postprocedural pain: Secondary | ICD-10-CM | POA: Diagnosis present

## 2019-10-27 DIAGNOSIS — M51379 Other intervertebral disc degeneration, lumbosacral region without mention of lumbar back pain or lower extremity pain: Secondary | ICD-10-CM

## 2019-10-27 MED ORDER — LACTATED RINGERS IV SOLN
1000.0000 mL | Freq: Once | INTRAVENOUS | Status: AC
  Administered 2019-10-27: 1000 mL via INTRAVENOUS

## 2019-10-27 MED ORDER — LIDOCAINE HCL 2 % IJ SOLN
20.0000 mL | Freq: Once | INTRAMUSCULAR | Status: AC
  Administered 2019-10-27: 400 mg
  Filled 2019-10-27: qty 40

## 2019-10-27 MED ORDER — OXYCODONE HCL 5 MG PO TABS: 5.0000 mg | ORAL_TABLET | Freq: Two times a day (BID) | ORAL | 0 refills | Status: DC | PRN

## 2019-10-27 MED ORDER — MIDAZOLAM HCL 5 MG/5ML IJ SOLN
1.0000 mg | INTRAMUSCULAR | Status: DC | PRN
  Administered 2019-10-27: 2 mg via INTRAVENOUS
  Filled 2019-10-27: qty 5

## 2019-10-27 MED ORDER — ROPIVACAINE HCL 2 MG/ML IJ SOLN
9.0000 mL | Freq: Once | INTRAMUSCULAR | Status: AC
  Administered 2019-10-27: 9 mL via PERINEURAL
  Filled 2019-10-27: qty 10

## 2019-10-27 MED ORDER — HYDROCODONE-ACETAMINOPHEN 5-325 MG PO TABS: 1.0000 | ORAL_TABLET | Freq: Four times a day (QID) | ORAL | 0 refills | Status: AC | PRN

## 2019-10-27 MED ORDER — TRIAMCINOLONE ACETONIDE 40 MG/ML IJ SUSP
40.0000 mg | Freq: Once | INTRAMUSCULAR | Status: AC
  Administered 2019-10-27: 11:00:00 40 mg
  Filled 2019-10-27: qty 1

## 2019-10-27 MED ORDER — FENTANYL CITRATE (PF) 100 MCG/2ML IJ SOLN
25.0000 ug | INTRAMUSCULAR | Status: DC | PRN
  Administered 2019-10-27: 50 ug via INTRAVENOUS
  Filled 2019-10-27: qty 2

## 2019-10-27 NOTE — Progress Notes (Signed)
Safety precautions to be maintained throughout the outpatient stay will include: orient to surroundings, keep bed in low position, maintain call bell within reach at all times, provide assistance with transfer out of bed and ambulation.  

## 2019-10-27 NOTE — Progress Notes (Addendum)
PROVIDER NOTE: Information contained herein reflects review and annotations entered in association with encounter. Patient information is provided elsewhere in the medical record. Interpretation of information and data should be left to medically trained personnel. Document created using STT technology, any transcriptional errors that may result from process are unintentional. Patient's Name: Shannon Benitez  MRN: [id number]  Referring Provider: Marygrace Drought, MD  DOB: Sep 01, 1971  PCP: Marygrace Drought, MD  DOS: 10/27/2019  Note by: [name], MD  Service setting: Ambulatory outpatient  Specialty: Interventional Pain Management  Patient type: Established  Location: [facility] (AMB) Pain Management Facility  Visit type: Interventional Procedure   Primary Reason for Visit: Interventional Pain Management Treatment. CC: Back Pain (low)  Procedure:          Anesthesia, Analgesia, Anxiolysis:  Type: Thermal Lumbar Facet, Medial Branch Radiofrequency Ablation/Neurotomy  #2  Primary Purpose: Therapeutic Region: Posterolateral Lumbosacral Spine Level: L2, L3, L4, L5, & S1 Medial Branch Level(s). These levels will denervate the L3-4, L4-5, and the L5-S1 lumbar facet joints. Laterality: Left  Type: Moderate (Conscious) Sedation combined with Local Anesthesia Indication(s): Analgesia and Anxiety Route: Intravenous (IV) IV Access: Secured Sedation: Meaningful verbal contact was maintained at all times during the procedure  Local Anesthetic: Lidocaine 1-2%  Position: Prone   Indications: 1. Lumbar facet syndrome (Bilateral) (R>L)   2. Spondylosis without myelopathy or radiculopathy, lumbosacral region   3. Lumbar facet hypertrophy (Multilevel) (Bilateral)   4. DDD (degenerative disc disease), lumbosacral   5. Chronic low back pain (Bilateral) (L>R) w/o sciatica   6. Lumbar spondylosis   7. Osteoarthritis of facet joint of lumbar spine   8. Osteoarthritis of lumbar spine    Ms.  Warning has been dealing with the above chronic pain for longer than three months and has either failed to respond, was unable to tolerate, or simply did not get enough benefit from other more conservative therapies including, but not limited to: 1. Over-the-counter medications 2. Anti-inflammatory medications 3. Muscle relaxants 4. Membrane stabilizers 5. Opioids 6. Physical therapy and/or chiropractic manipulation 7. Modalities (Heat, ice, etc.) 8. Invasive techniques such as nerve blocks. Shannon Benitez has attained more than 50% relief of the pain from a series of diagnostic injections conducted in separate occasions.  Pain Score: Pre-procedure: 0-No pain/10 Post-procedure: 0-No pain/10  Post-Procedure Evaluation  Procedure: Therapeutic right-sided lumbar facet RFA #2 under fluoroscopic guidance and IV sedation Pre-procedure pain level: 6/10 Post-procedure: 0/10 (100% relief)  Sedation: Sedation provided.  Effectiveness during initial hour after procedure(Ultra-Short Term Relief): 100 % .  Local anesthetic used: Long-acting (4-6 hours) Effectiveness: Defined as any analgesic benefit obtained secondary to the administration of local anesthetics. This carries significant diagnostic value as to the etiological location, or anatomical origin, of the pain. Duration of benefit is expected to coincide with the duration of the local anesthetic used.  Effectiveness during initial 4-6 hours after procedure(Short-Term Relief): 100 % .  Long-term benefit: Defined as any relief past the pharmacologic duration of the local anesthetics.  Effectiveness past the initial 6 hours after procedure(Long-Term Relief): 75 % .  Current benefits: Defined as benefit that persist at this time.   Analgesia:  75% relief Function: Shannon Benitez reports improvement in function ROM: Shannon Benitez reports improvement in ROM  Pre-op Assessment:  Shannon Benitez is a 48 y.o. (year old), female patient,  seen today for interventional treatment. She  has a past surgical history that includes Abdominal hysterectomy; Cesarean section; Tumor removal (Left); Cataract extraction w/PHACO (Right, 03/18/2019); and  Cataract extraction w/PHACO (Left, 04/13/2019). Shannon Benitez has a current medication list which includes the following prescription(s): accu-chek softclix lancets, aspirin-acetaminophen-caffeine, baclofen, dimethyl fumarate, duloxetine, lamotrigine, loperamide, meloxicam, metformin, mirabegron er, [START ON 11/20/2019] oxycodone, pregabalin, primidone, propranolol, sitagliptin, hydrocodone-acetaminophen, [START ON 11/03/2019] hydrocodone-acetaminophen, omeprazole, [START ON 12/20/2019] oxycodone, and [START ON 01/19/2020] oxycodone, and the following Facility-Administered Medications: fentanyl and midazolam. Her primarily concern today is the Back Pain (low)  Initial Vital Signs:  Pulse/HCG Rate: 74ECG Heart Rate: 76 Temp: (!) 97.2 F (36.2 C) Resp: 18 BP: 125/69 SpO2: 100 %  BMI: Estimated body mass index is 34.96 kg/m as calculated from the following:   Height as of this encounter: 5\' 1"  (1.549 m).   Weight as of this encounter: 185 lb (83.9 kg).  Risk Assessment: Allergies: Reviewed. She is allergic to 5-alpha reductase inhibitors; glatiramer; rebif [interferon beta-1a]; and latex.  Allergy Precautions: None required Coagulopathies: Reviewed. None identified.  Blood-thinner therapy: None at this time Active Infection(s): Reviewed. None identified. Shannon Benitez is afebrile  Site Confirmation: Shannon Benitez was asked to confirm the procedure and laterality before marking the site Procedure checklist: Completed Consent: Before the procedure and under the influence of no sedative(s), amnesic(s), or anxiolytics, the patient was informed of the treatment options, risks and possible complications. To fulfill our ethical and legal obligations, as recommended by the American Medical  Association's Code of Ethics, I have informed the patient of my clinical impression; the nature and purpose of the treatment or procedure; the risks, benefits, and possible complications of the intervention; the alternatives, including doing nothing; the risk(s) and benefit(s) of the alternative treatment(s) or procedure(s); and the risk(s) and benefit(s) of doing nothing. The patient was provided information about the general risks and possible complications associated with the procedure. These may include, but are not limited to: failure to achieve desired goals, infection, bleeding, organ or nerve damage, allergic reactions, paralysis, and death. In addition, the patient was informed of those risks and complications associated to Spine-related procedures, such as failure to decrease pain; infection (i.e.: Meningitis, epidural or intraspinal abscess); bleeding (i.e.: epidural hematoma, subarachnoid hemorrhage, or any other type of intraspinal or peri-dural bleeding); organ or nerve damage (i.e.: Any type of peripheral nerve, nerve root, or spinal cord injury) with subsequent damage to sensory, motor, and/or autonomic systems, resulting in permanent pain, numbness, and/or weakness of one or several areas of the body; allergic reactions; (i.e.: anaphylactic reaction); and/or death. Furthermore, the patient was informed of those risks and complications associated with the medications. These include, but are not limited to: allergic reactions (i.e.: anaphylactic or anaphylactoid reaction(s)); adrenal axis suppression; blood sugar elevation that in diabetics may result in ketoacidosis or comma; water retention that in patients with history of congestive heart failure may result in shortness of breath, pulmonary edema, and decompensation with resultant heart failure; weight gain; swelling or edema; medication-induced neural toxicity; particulate matter embolism and blood vessel occlusion with resultant organ, and/or  nervous system infarction; and/or aseptic necrosis of one or more joints. Finally, the patient was informed that Medicine is not an exact science; therefore, there is also the possibility of unforeseen or unpredictable risks and/or possible complications that may result in a catastrophic outcome. The patient indicated having understood very clearly. We have given the patient no guarantees and we have made no promises. Enough time was given to the patient to ask questions, all of which were answered to the patient's satisfaction. Ms. Zappa has indicated that she wanted to continue with the  procedure. Attestation: I, the ordering provider, attest that I have discussed with the patient the benefits, risks, side-effects, alternatives, likelihood of achieving goals, and potential problems during recovery for the procedure that I have provided informed consent. Date  Time: 10/27/2019 10:23 AM  Pre-Procedure Preparation:  Monitoring: As per clinic protocol. Respiration, ETCO2, SpO2, BP, heart rate and rhythm monitor placed and checked for adequate function Safety Precautions: Patient was assessed for positional comfort and pressure points before starting the procedure. Time-out: I initiated and conducted the "Time-out" before starting the procedure, as per protocol. The patient was asked to participate by confirming the accuracy of the "Time Out" information. Verification of the correct person, site, and procedure were performed and confirmed by me, the nursing staff, and the patient. "Time-out" conducted as per Joint Commission's Universal Protocol (UP.01.01.01). Time: 1057  Description of Procedure:          Laterality: Left Levels:  L2, L3, L4, L5, & S1 Medial Branch Level(s), at the L3-4, L4-5, and the L5-S1 lumbar facet joints. Area Prepped: Lumbosacral Prepping solution: DuraPrep (Iodine Povacrylex [0.7% available iodine] and Isopropyl Alcohol, 74% w/w) Safety Precautions: Aspiration looking  for blood return was conducted prior to all injections. At no point did we inject any substances, as a needle was being advanced. Before injecting, the patient was told to immediately notify me if she was experiencing any new onset of "ringing in the ears, or metallic taste in the mouth". No attempts were made at seeking any paresthesias. Safe injection practices and needle disposal techniques used. Medications properly checked for expiration dates. SDV (single dose vial) medications used. After the completion of the procedure, all disposable equipment used was discarded in the proper designated medical waste containers. Local Anesthesia: Protocol guidelines were followed. The patient was positioned over the fluoroscopy table. The area was prepped in the usual manner. The time-out was completed. The target area was identified using fluoroscopy. A 12-in long, straight, sterile hemostat was used with fluoroscopic guidance to locate the targets for each level blocked. Once located, the skin was marked with an approved surgical skin marker. Once all sites were marked, the skin (epidermis, dermis, and hypodermis), as well as deeper tissues (fat, connective tissue and muscle) were infiltrated with a small amount of a short-acting local anesthetic, loaded on a 10cc syringe with a 25G, 1.5-in  Needle. An appropriate amount of time was allowed for local anesthetics to take effect before proceeding to the next step. Local Anesthetic: Lidocaine 2.0% The unused portion of the local anesthetic was discarded in the proper designated containers. Technical explanation of process:  Radiofrequency Ablation (RFA) L2 Medial Branch Nerve RFA: The target area for the L2 medial branch is at the junction of the postero-lateral aspect of the superior articular process and the superior, posterior, and medial edge of the transverse process of L3. Under fluoroscopic guidance, a Radiofrequency needle was inserted until contact was made  with os over the superior postero-lateral aspect of the pedicular shadow (target area). Sensory and motor testing was conducted to properly adjust the position of the needle. Once satisfactory placement of the needle was achieved, the numbing solution was slowly injected after negative aspiration for blood. 2.0 mL of the nerve block solution was injected without difficulty or complication. After waiting for at least 3 minutes, the ablation was performed. Once completed, the needle was removed intact. L3 Medial Branch Nerve RFA: The target area for the L3 medial branch is at the junction of the postero-lateral aspect of  the superior articular process and the superior, posterior, and medial edge of the transverse process of L4. Under fluoroscopic guidance, a Radiofrequency needle was inserted until contact was made with os over the superior postero-lateral aspect of the pedicular shadow (target area). Sensory and motor testing was conducted to properly adjust the position of the needle. Once satisfactory placement of the needle was achieved, the numbing solution was slowly injected after negative aspiration for blood. 2.0 mL of the nerve block solution was injected without difficulty or complication. After waiting for at least 3 minutes, the ablation was performed. Once completed, the needle was removed intact. L4 Medial Branch Nerve RFA: The target area for the L4 medial branch is at the junction of the postero-lateral aspect of the superior articular process and the superior, posterior, and medial edge of the transverse process of L5. Under fluoroscopic guidance, a Radiofrequency needle was inserted until contact was made with os over the superior postero-lateral aspect of the pedicular shadow (target area). Sensory and motor testing was conducted to properly adjust the position of the needle. Once satisfactory placement of the needle was achieved, the numbing solution was slowly injected after negative aspiration  for blood. 2.0 mL of the nerve block solution was injected without difficulty or complication. After waiting for at least 3 minutes, the ablation was performed. Once completed, the needle was removed intact. L5 Medial Branch Nerve RFA: The target area for the L5 medial branch is at the junction of the postero-lateral aspect of the superior articular process of S1 and the superior, posterior, and medial edge of the sacral ala. Under fluoroscopic guidance, a Radiofrequency needle was inserted until contact was made with os over the superior postero-lateral aspect of the pedicular shadow (target area). Sensory and motor testing was conducted to properly adjust the position of the needle. Once satisfactory placement of the needle was achieved, the numbing solution was slowly injected after negative aspiration for blood. 2.0 mL of the nerve block solution was injected without difficulty or complication. After waiting for at least 3 minutes, the ablation was performed. Once completed, the needle was removed intact. S1 Medial Branch Nerve RFA: The target area for the S1 medial branch is located inferior to the junction of the S1 superior articular process and the L5 inferior articular process, posterior, inferior, and lateral to the 6 o'clock position of the L5-S1 facet joint, just superior to the S1 posterior foramen. Under fluoroscopic guidance, the Radiofrequency needle was advanced until contact was made with os over the Target area. Sensory and motor testing was conducted to properly adjust the position of the needle. Once satisfactory placement of the needle was achieved, the numbing solution was slowly injected after negative aspiration for blood. 2.0 mL of the nerve block solution was injected without difficulty or complication. After waiting for at least 3 minutes, the ablation was performed. Once completed, the needle was removed intact. Radiofrequency lesioning (ablation):  Radiofrequency Generator:  NeuroTherm [id number] Sensory Stimulation Parameters: 50 Hz was used to locate & identify the nerve, making sure that the needle was positioned such that there was no sensory stimulation below 0.3 V or above 0.7 V. Motor Stimulation Parameters: 2 Hz was used to evaluate the motor component. Care was taken not to lesion any nerves that demonstrated motor stimulation of the lower extremities at an output of less than 2.5 times that of the sensory threshold, or a maximum of 2.0 V. Lesioning Technique Parameters: Standard Radiofrequency settings. (Not bipolar or pulsed.) Temperature Settings: 80  degrees C Lesioning time: 60 seconds Intra-operative Compliance: Compliant Materials & Medications: Needle(s) (Electrode/Cannula) Type: Teflon-coated, curved tip, Radiofrequency needle(s) Gauge: 22G Length: 10cm Numbing solution: 0.2% PF-Ropivacaine + Triamcinolone (40 mg/mL) diluted to a final concentration of 4 mg of Triamcinolone/mL of Ropivacaine The unused portion of the solution was discarded in the proper designated containers.  Once the entire procedure was completed, the treated area was cleaned, making sure to leave some of the prepping solution back to take advantage of its long term bactericidal properties.  Illustration of the posterior view of the lumbar spine and the posterior neural structures. Laminae of L2 through S1 are labeled. DPRL5, dorsal primary ramus of L5; DPRS1, dorsal primary ramus of S1; DPR3, dorsal primary ramus of L3; FJ, facet (zygapophyseal) joint L3-L4; I, inferior articular process of L4; LB1, lateral branch of dorsal primary ramus of L1; IAB, inferior articular branches from L3 medial branch (supplies L4-L5 facet joint); IBP, intermediate branch plexus; MB3, medial branch of dorsal primary ramus of L3; NR3, third lumbar nerve root; S, superior articular process of L5; SAB, superior articular branches from L4 (supplies L4-5 facet joint also); TP3, transverse process of  L3.  Vitals:   10/27/19 1134 10/27/19 1140 10/27/19 1150 10/27/19 1158  BP: 113/75 111/84 106/72 110/73  Pulse:      Resp: 14 15 16 16   Temp:      SpO2: 98% 97% 98% 99%  Weight:      Height:        Start Time: 1057 hrs. End Time: 1131 hrs.  Imaging Guidance (Spinal):          Type of Imaging Technique: Fluoroscopy Guidance (Spinal) Indication(s): Assistance in needle guidance and placement for procedures requiring needle placement in or near specific anatomical locations not easily accessible without such assistance. Exposure Time: Please see nurses notes. Contrast: None used. Fluoroscopic Guidance: I was personally present during the use of fluoroscopy. "Tunnel Vision Technique" used to obtain the best possible view of the target area. Parallax error corrected before commencing the procedure. "Direction-depth-direction" technique used to introduce the needle under continuous pulsed fluoroscopy. Once target was reached, antero-posterior, oblique, and lateral fluoroscopic projection used confirm needle placement in all planes. Images permanently stored in EMR. Interpretation: No contrast injected. I personally interpreted the imaging intraoperatively. Adequate needle placement confirmed in multiple planes. Permanent images saved into the patient's record.  Antibiotic Prophylaxis:   Anti-infectives (From admission, onward)   None     Indication(s): None identified  Post-operative Assessment:  Post-procedure Vital Signs:  Pulse/HCG Rate: 7474 Temp: (!) 97.2 F (36.2 C) Resp: 16 BP: 110/73 SpO2: 99 %  EBL: None  Complications: No immediate post-treatment complications observed by team, or reported by patient.  Note: The patient tolerated the entire procedure well. A repeat set of vitals were taken after the procedure and the patient was kept under observation following institutional policy, for this type of procedure. Post-procedural neurological assessment was performed,  showing return to baseline, prior to discharge. The patient was provided with post-procedure discharge instructions, including a section on how to identify potential problems. Should any problems arise concerning this procedure, the patient was given instructions to immediately contact us, at any time, without hesitation. In any case, we plan to contact the patient by telephone for a follow-up status report regarding this interventional procedure.  Comments:  No additional relevant information.  Plan of Care  Orders:  Orders Placed This Encounter  Procedures  . RFA - Lumbar Facet (Today)  Scheduling Instructions:     Side(s): Left-sided     Level: L3-4, L4-5, & L5-S1 Facets (L2, L3, L4, L5, & S1 Medial Branch Nerves)     Sedation: With Sedation     Timeframe: Today    Order Specific Question:   Where will this procedure be performed?    Answer:   [facility]  . Fluoro (C-Arm) (<60 min) (No Report)    Intraoperative interpretation by procedural physician at [place].    Standing Status:   Standing    Number of Occurrences:   1    Order Specific Question:   Reason for exam:    Answer:   Assistance in needle guidance and placement for procedures requiring needle placement in or near specific anatomical locations not easily accessible without such assistance.  . Consent: L-FCT (RFA)    Nursing Order: Transcribe to consent form and obtain patient signature. Note: Always confirm laterality of pain with Ms. Trager, before procedure. Procedure: Lumbar Facet Radiofrequency Ablation Indication/Reason: Low Back Pain, with our without leg pain, due to Facet Joint Arthralgia (Joint Pain) known as Lumbar Facet Syndrome, secondary to Lumbar, and/or Lumbosacral Spondylosis (Arthritis of the Spine), without myelopathy or radiculopathy (Nerve Damage). Provider Attestation: I, Baldwin Dossie Arbour, MD, (Pain Management Specialist), the physician/practitioner, attest that I have  discussed with the patient the benefits, risks, side effects, alternatives, likelihood of achieving goals and potential problems during recovery for the procedure that I have provided informed consent.  . Radiofrequency Tray    Equipment required: Sterile "Radiofrequency Tray"; Large hemostat (1); Small hemostat (1); Towels (6-8); 4x4 sterile sponge pack (1) Radiofrequency Needle(s): Size: Long Quantity: 5    Standing Status:   Standing    Number of Occurrences:   1    Order Specific Question:   Specify    Answer:   Radiofrequency Tray   Chronic Opioid Analgesic:  Oxycodone IR 5 mg, 1 tab PO BID (10 mg/day of oxycodone)  MME/day:15mg /day.   Medications ordered for procedure: Meds ordered this encounter  Medications  . lidocaine (XYLOCAINE) 2 % (with pres) injection 400 mg  . lactated ringers infusion 1,000 mL  . midazolam (VERSED) 5 MG/5ML injection 1-2 mg    Make sure Flumazenil is available in the pyxis when using this medication. If oversedation occurs, administer 0.2 mg IV over 15 sec. If after 45 sec no response, administer 0.2 mg again over 1 min; may repeat at 1 min intervals; not to exceed 4 doses (1 mg)  . fentaNYL (SUBLIMAZE) injection 25-50 mcg    Make sure Narcan is available in the pyxis when using this medication. In the event of respiratory depression (RR< 8/min): Titrate NARCAN (naloxone) in increments of 0.1 to 0.2 mg IV at 2-3 minute intervals, until desired degree of reversal.  . ropivacaine (PF) 2 mg/mL (0.2%) (NAROPIN) injection 9 mL  . triamcinolone acetonide (KENALOG-40) injection 40 mg  . HYDROcodone-acetaminophen (NORCO/VICODIN) 5-325 MG tablet    Sig: Take 1 tablet by mouth every 6 (six) hours as needed for up to 7 days for severe pain. Must last 7 days.    Dispense:  28 tablet    Refill:  0    For acute post-operative pain. Not to be refilled. Must last 7 days.  Marland Kitchen HYDROcodone-acetaminophen (NORCO/VICODIN) 5-325 MG tablet    Sig: Take 1 tablet by mouth  every 6 (six) hours as needed for up to 7 days for severe pain. Must last 7 days.  Dispense:  28 tablet    Refill:  0    For acute post-operative pain. Not to be refilled.  Must last 7 days.  Marland Kitchen oxyCODONE (OXY IR/ROXICODONE) 5 MG immediate release tablet    Sig: Take 1 tablet (5 mg total) by mouth 2 (two) times daily as needed for severe pain. Must last 30 days.    Dispense:  60 tablet    Refill:  0    Chronic Pain: STOP Act (Not applicable) Fill 1 day early if closed on refill date. Do not fill until: 12/20/2019. To last until: 01/19/2020. Avoid benzodiazepines within 8 hours of opioids  . oxyCODONE (OXY IR/ROXICODONE) 5 MG immediate release tablet    Sig: Take 1 tablet (5 mg total) by mouth 2 (two) times daily as needed for severe pain. Must last 30 days.    Dispense:  60 tablet    Refill:  0    Chronic Pain: STOP Act (Not applicable) Fill 1 day early if closed on refill date. Do not fill until: 01/19/2020. To last until: 02/18/2020. Avoid benzodiazepines within 8 hours of opioids   Medications administered: We administered lidocaine, lactated ringers, midazolam, fentaNYL, ropivacaine (PF) 2 mg/mL (0.2%), and triamcinolone acetonide.  See the medical record for exact dosing, route, and time of administration.  Follow-up plan:   Return in about 6 weeks (around 12/08/2019) for (VV), (PP) post-RFA..       Interventional management options: Considering:   Continue encouraging the patient to bring her BMI below 35. Diagnostic bilateral T11-12 lumbar facet block   Diagnostic right T7-8 TESI   Diagnostic right T7 TFESI   Diagnostic ML CESI     Palliative PRN treatment(s):   Palliative bilateral lumbar facet block #4  Palliative left lumbar facet RFA #3 (last done on 10/27/2019)  Palliative right lumbar facet RFA #3 (last done on 10/08/2019)     Recent Visits Date Type Provider Dept  10/08/19 Procedure visit Milinda Pointer, MD [facility]  09/14/19 Telemedicine Milinda Pointer, MD [facility]  08/25/19 Procedure visit Milinda Pointer, MD [facility]  08/17/19 Office Visit Milinda Pointer, MD [facility]  Showing recent visits within past 90 days and meeting all other requirements   Today's Visits Date Type Provider Dept  10/27/19 Procedure visit Milinda Pointer, MD [facility]  Showing today's visits and meeting all other requirements   Future Appointments Date Type Provider Dept  11/19/19 Appointment Milinda Pointer, MD [facility]  12/08/19 Appointment Milinda Pointer, MD [facility]  Showing future appointments within next 90 days and meeting all other requirements   Disposition: Discharge home  Discharge Date & Time: 10/27/2019; 1159 hrs.   Primary Care Physician: Marygrace Drought, MD Location: [facility] Note by: [name], MD Date: 10/27/2019; Time: 12:46 PM  Disclaimer:  Medicine is not an Chief Strategy Officer. The only guarantee in medicine is that nothing is guaranteed. It is important to note that the decision to proceed with this intervention was based on the information collected from the patient. The Data and conclusions were drawn from the patient's questionnaire, the interview, and the physical examination. Because the information was provided in large part by the patient, it cannot be guaranteed that it has not been purposely or unconsciously manipulated. Every effort has been made to obtain as much relevant data as possible for this evaluation. It is important to note that the conclusions that lead to this procedure are derived in  large part from the available data. Always take into account that the treatment will also be dependent on availability of resources and existing treatment guidelines, considered by other Pain Management Practitioners as being common knowledge and practice, at the time of the intervention. For Medico-Legal  purposes, it is also important to point out that variation in procedural techniques and pharmacological choices are the acceptable norm. The indications, contraindications, technique, and results of the above procedure should only be interpreted and judged by a Board-Certified Interventional Pain Specialist with extensive familiarity and expertise in the same exact procedure and technique.

## 2019-10-27 NOTE — Patient Instructions (Signed)

## 2019-10-28 ENCOUNTER — Telehealth: Payer: Self-pay

## 2019-10-28 NOTE — Telephone Encounter (Signed)
Post procedure phone call.  LM 

## 2019-11-16 ENCOUNTER — Ambulatory Visit: Payer: Medicaid Other | Admitting: Pain Medicine

## 2019-11-17 DIAGNOSIS — E538 Deficiency of other specified B group vitamins: Secondary | ICD-10-CM | POA: Insufficient documentation

## 2019-11-17 DIAGNOSIS — N3281 Overactive bladder: Secondary | ICD-10-CM | POA: Insufficient documentation

## 2019-11-17 DIAGNOSIS — K529 Noninfective gastroenteritis and colitis, unspecified: Secondary | ICD-10-CM | POA: Insufficient documentation

## 2019-11-17 DIAGNOSIS — D509 Iron deficiency anemia, unspecified: Secondary | ICD-10-CM | POA: Insufficient documentation

## 2019-11-18 ENCOUNTER — Encounter: Payer: Self-pay | Admitting: Pain Medicine

## 2019-11-18 ENCOUNTER — Telehealth: Payer: Self-pay | Admitting: Pain Medicine

## 2019-11-18 NOTE — Progress Notes (Signed)
Pain relief after procedure (treated area only): (Questions asked to patient) 1. Starting about 15 minutes after the procedure, and "while the area was still numb" (from the local anesthetics), were you having any of your usual pain "in that area" (the treated area)?  (NOTE: NOT including the discomfort from the needle sticks.) First 1 hour: 100 % better. First 4-6 hours: 100 % better.  3. How much better is your pain now, when compared to before the procedure? Current benefit: 90 % better. 4. Can you move better now? Improvement in ROM (Range of Motion): Yes. 5. Can you do more now? Improvement in function: Yes. 4. Did you have any problems with the procedure? Side-effects/Complications: No.

## 2019-11-18 NOTE — Telephone Encounter (Signed)
Returned nurse call  °

## 2019-11-19 ENCOUNTER — Ambulatory Visit: Payer: Medicaid Other | Attending: Pain Medicine | Admitting: Pain Medicine

## 2019-11-19 ENCOUNTER — Other Ambulatory Visit: Payer: Self-pay

## 2019-11-19 DIAGNOSIS — G894 Chronic pain syndrome: Secondary | ICD-10-CM | POA: Diagnosis not present

## 2019-11-19 DIAGNOSIS — M47816 Spondylosis without myelopathy or radiculopathy, lumbar region: Secondary | ICD-10-CM

## 2019-11-19 DIAGNOSIS — M545 Low back pain: Secondary | ICD-10-CM | POA: Diagnosis not present

## 2019-11-19 DIAGNOSIS — G8929 Other chronic pain: Secondary | ICD-10-CM

## 2019-11-19 NOTE — Progress Notes (Signed)
Patient: Shannon Benitez  Service Category: E/M  Provider: [name], MD  DOB: 1970-12-30  DOS: 11/19/2019  Location: Office  MRN: [id number]  Setting: Ambulatory outpatient  Referring Provider: Marygrace Drought, MD  Type: Established Patient  Specialty: Interventional Pain Management  PCP: Shannon Drought, MD  Location: Remote location  Delivery: TeleHealth     Virtual Encounter - Pain Management PROVIDER NOTE: Information contained herein reflects review and annotations entered in association with encounter. Interpretation of such information and data should be left to medically-trained personnel. Information provided to patient can be located elsewhere in the medical record under "Patient Instructions". Document created using STT-dictation technology, any transcriptional errors that may result from process are unintentional.    Contact & Pharmacy Preferred: [phone] Home: [phone] (home) Mobile: [phone] (mobile) E-mail: [email or web address]  [place], [address] [place] [address] Phone: [phone] Fax: [phone]   Pre-screening  Shannon Benitez offered "in-person" vs "virtual" encounter. She indicated preferring virtual for this encounter.   Reason COVID-19*  Social distancing based on CDC and AMA recommendations.   I contacted Shannon Benitez on 11/19/2019 via telephone.      I clearly identified myself as Shannon Cola, MD. I verified that I was speaking with the correct person using two identifiers (Name: Shannon Benitez, and date of birth: 1971/09/12).  Consent I sought verbal advanced consent from [facility] for virtual visit interactions. I informed Shannon Benitez of possible security and privacy concerns, risks, and limitations associated with providing "not-in-person" medical evaluation and management services. I also informed Shannon Benitez of the availability of "in-person" appointments.  Finally, I informed her that there would be a charge for the virtual visit and that she could be  personally, fully or partially, financially responsible for it. Shannon Benitez expressed understanding and agreed to proceed.   Historic Elements   Shannon Benitez is a 49 y.o. year old, female patient evaluated today after her last encounter by our practice on 11/18/2019. Shannon Benitez  has a past medical history of Acute postoperative pain (04/30/2019), Diabetes mellitus without complication ([place]), GERD (gastroesophageal reflux disease), Headache, MS (multiple sclerosis) ([place]), Numbness and tingling of both legs below knees, Osteoarthritis, Vertigo, and Wears dentures. She also  has a past surgical history that includes Abdominal hysterectomy; Cesarean section; Tumor removal (Left); Cataract extraction w/PHACO (Right, 03/18/2019); and Cataract extraction w/PHACO (Left, 04/13/2019). Shannon Benitez has a current medication list which includes the following prescription(s): accu-chek softclix lancets, aspirin-acetaminophen-caffeine, baclofen, dimethyl fumarate, duloxetine, lamotrigine, loperamide, meloxicam, metformin, mirabegron er, [START ON 11/20/2019] oxycodone, [START ON 12/20/2019] oxycodone, [START ON 01/19/2020] oxycodone, pregabalin, primidone, propranolol, sitagliptin, and omeprazole. She  reports that she quit smoking about 2 years ago. Her smoking use included cigarettes. She has a 9.00 pack-year smoking history. She has quit using smokeless tobacco. She reports that she does not drink alcohol. No history on file for drug. Shannon Benitez is allergic to 5-alpha reductase inhibitors; glatiramer; rebif [interferon beta-1a]; and latex.   HPI  Today, she is being contacted for a post-procedure assessment.  The patient describes having had excellent results which have allowed her to have better range of motion and increase her labral activity to the point where she now walks 30 minutes every day and has  been able to lose 40 pounds.  This in turn has allowed the patient to cut her medications down in half.  Post-Procedure Evaluation  Procedure (10/27/2019): Therapeutic left lumbar facet RFA #2  under fluoroscopic guidance and IV sedation Pre-procedure pain level:  0/10 Post-procedure: 0/10 (100% relief)  Sedation: Sedation provided.  [name], RN  11/18/2019  8:59 AM  Sign when Signing Visit Pain relief after procedure (treated area only): (Questions asked to patient) 1. Starting about 15 minutes after the procedure, and "while the area was still numb" (from the local anesthetics), were you having any of your usual pain "in that area" (the treated area)?  (NOTE: NOT including the discomfort from the needle sticks.) First 1 hour: 100 % better. First 4-6 hours: 100 % better.  3. How much better is your pain now, when compared to before the procedure? Current benefit: 90 % better. 4. Can you move better now? Improvement in ROM (Range of Motion): Yes. 5. Can you do more now? Improvement in function: Yes. 4. Did you have any problems with the procedure? Side-effects/Complications: No.  Current benefits: Defined as benefit that persist at this time.   Analgesia:  90-100% better Function: Shannon Benitez reports improvement in function ROM: Shannon Benitez reports improvement in ROM  Pharmacotherapy Assessment  Analgesic: Oxycodone IR 5 mg, 1 tab PO BID (10 mg/day of oxycodone)  MME/day:15mg /day.   Monitoring: Pharmacotherapy: No side-effects or adverse reactions reported. [place] PMP: PDMP reviewed during this encounter.       Compliance: No problems identified. Effectiveness: Clinically acceptable. Plan: Refer to "POC".  UDS:  Summary  Date Value Ref Range Status  11/27/2018 FINAL  Final    Comment:    ==================================================================== [facility] COMP DRUG ANALYSIS,UR ==================================================================== Test                              Result       Flag       Units Drug Present and Declared for Prescription Verification   7-aminoclonazepam              319          EXPECTED   ng/mg creat    7-aminoclonazepam is an expected metabolite of clonazepam. Source    of clonazepam is a scheduled prescription medication.   Primidone                      PRESENT      EXPECTED   Phenobarbital                  PRESENT      EXPECTED    Phenobarbital is an expected metabolite of primidone;    Phenobarbital may also be administered as a prescription drug.   Gabapentin                     PRESENT      EXPECTED   Lamotrigine                    PRESENT      EXPECTED   Baclofen                       PRESENT      EXPECTED   Duloxetine                     PRESENT      EXPECTED   Propranolol                    PRESENT      EXPECTED  Drug Present not Declared for Prescription Verification   Metaxalone                     PRESENT      UNEXPECTED   Acetaminophen                  PRESENT      UNEXPECTED   Diphenhydramine                PRESENT      UNEXPECTED ==================================================================== Test                      Result    Flag   Units      Ref Range   Creatinine              47               mg/dL      >=20 ==================================================================== Declared Medications:  The flagging and interpretation on this report are based on the  following declared medications.  Unexpected results may arise from  inaccuracies in the declared medications.  **Note: The testing scope of this panel includes these medications:  Baclofen (Lioresal)  Clonazepam (Klonopin)  Duloxetine (Cymbalta)  Gabapentin (Neurontin)  Lamotrigine (Lamictal)  Primidone (Mysoline)  Propranolol (Inderal)  **Note: The testing scope of this panel does not include following  reported medications:  Dimethyl Fumarate  Metformin (Glucophage)  Mirabegron (Myrbetriq)  Omeprazole  (Prilosec)  Sitagliptin (Januvia) ==================================================================== For clinical consultation, please call [phone]. ====================================================================    Laboratory Chemistry Profile (12 mo)  Renal: 11/27/2018: BUN/Creatinine Ratio 13 09/25/2019: BUN 13; Creatinine, Ser 0.53  Lab Results  Component Value Date   GFRAA >60 09/25/2019   GFRNONAA >60 09/25/2019   Hepatic: 11/27/2018: Albumin 4.1 Lab Results  Component Value Date   AST 17 11/27/2018   ALT 35 08/18/2018   Other: 11/27/2018: 25-Hydroxy, Vitamin D 55; 25-Hydroxy, Vitamin D-2 <1.0; 25-Hydroxy, Vitamin D-3 55; CRP 10; Sed Rate 42; Vitamin B-12 372 Note: Above Lab results reviewed.  Imaging  Fluoro (C-Arm) (<60 min) (No Report) Fluoro was used, but no Radiologist interpretation will be provided.  Please refer to "NOTES" tab for provider progress note.   Assessment  The primary encounter diagnosis was Chronic pain syndrome. 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  Plan of Care  Problem-specific:  No problem-specific Assessment & Plan notes found for this encounter.  I am having Shannon Benitez maintain her propranolol, omeprazole, lamoTRIgine, DULoxetine, mirabegron ER, loperamide, aspirin-acetaminophen-caffeine, primidone, Accu-Chek Softclix Lancets, metFORMIN, sitaGLIPtin, meloxicam, baclofen, pregabalin, Dimethyl Fumarate, oxyCODONE, oxyCODONE, and oxyCODONE.  Pharmacotherapy (Medications Ordered): No orders of the defined types were placed in this encounter.  Orders:  No orders of the defined types were placed in this encounter.  Follow-up plan:   Return for regular appointment.      Interventional management options: Considering:   Continue encouraging the patient to bring her BMI below 35. Diagnostic bilateral T11-12 lumbar  facet block   Diagnostic right T7-8 TESI   Diagnostic right T7 TFESI   Diagnostic ML CESI     Palliative PRN treatment(s):   Palliative bilateral lumbar facet block #4  Palliative left lumbar facet RFA #3 (last done on 10/27/2019)  Palliative right lumbar facet RFA #3 (last done on 10/08/2019)     Recent Visits Date Type Provider Dept  10/27/19 Procedure  visit Milinda Pointer, MD [facility]  10/08/19 Procedure visit Milinda Pointer, MD [facility]  09/14/19 Telemedicine Milinda Pointer, MD [facility]  08/25/19 Procedure visit Milinda Pointer, MD [facility]  Showing recent visits within past 90 days and meeting all other requirements   Today's Visits Date Type Provider Dept  11/19/19 Telemedicine Milinda Pointer, MD [facility]  Showing today's visits and meeting all other requirements   Future Appointments Date Type Provider Dept  12/08/19 Appointment Milinda Pointer, MD [facility]  02/17/20 Appointment Milinda Pointer, MD [facility]  Showing future appointments within next 90 days and meeting all other requirements   I discussed the assessment and treatment plan with the patient. The patient was provided an opportunity to ask questions and all were answered. The patient agreed with the plan and demonstrated an understanding of the instructions.  Patient advised to call back or seek an in-person evaluation if the symptoms or condition worsens.  Total duration of non-face-to-face encounter: 13 minutes.  Note by: Shannon Cola, MD Date: 11/19/2019; Time: 2:31 PM

## 2019-12-08 ENCOUNTER — Telehealth: Payer: Medicaid Other | Admitting: Pain Medicine

## 2019-12-16 ENCOUNTER — Telehealth: Payer: Medicaid Other | Admitting: Pain Medicine

## 2020-02-10 ENCOUNTER — Encounter: Payer: Self-pay | Admitting: Pain Medicine

## 2020-02-11 ENCOUNTER — Other Ambulatory Visit: Payer: Self-pay

## 2020-02-11 ENCOUNTER — Ambulatory Visit: Payer: Medicaid Other | Attending: Pain Medicine | Admitting: Pain Medicine

## 2020-02-11 DIAGNOSIS — G894 Chronic pain syndrome: Secondary | ICD-10-CM | POA: Diagnosis not present

## 2020-02-11 DIAGNOSIS — E1142 Type 2 diabetes mellitus with diabetic polyneuropathy: Secondary | ICD-10-CM

## 2020-02-11 DIAGNOSIS — M545 Low back pain, unspecified: Secondary | ICD-10-CM

## 2020-02-11 DIAGNOSIS — M47816 Spondylosis without myelopathy or radiculopathy, lumbar region: Secondary | ICD-10-CM

## 2020-02-11 DIAGNOSIS — M792 Neuralgia and neuritis, unspecified: Secondary | ICD-10-CM | POA: Diagnosis not present

## 2020-02-11 DIAGNOSIS — M62838 Other muscle spasm: Secondary | ICD-10-CM

## 2020-02-11 DIAGNOSIS — G8929 Other chronic pain: Secondary | ICD-10-CM

## 2020-02-11 MED ORDER — MELOXICAM 15 MG PO TABS: 15.0000 mg | ORAL_TABLET | Freq: Every day | ORAL | 5 refills | Status: DC

## 2020-02-11 MED ORDER — BACLOFEN 20 MG PO TABS: 20.0000 mg | ORAL_TABLET | Freq: Four times a day (QID) | ORAL | 5 refills | Status: DC

## 2020-02-11 MED ORDER — OXYCODONE HCL 5 MG PO TABS: 5.0000 mg | ORAL_TABLET | Freq: Two times a day (BID) | ORAL | 0 refills | Status: DC | PRN

## 2020-02-11 MED ORDER — PREGABALIN 150 MG PO CAPS: 150.0000 mg | ORAL_CAPSULE | Freq: Three times a day (TID) | ORAL | 5 refills | Status: DC

## 2020-02-11 NOTE — Patient Instructions (Signed)
____________________________________________________________________________________________  Preparing for Procedure with Sedation  Procedure appointments are limited to planned procedures: . No Prescription Refills. . No disability issues will be discussed. . No medication changes will be discussed.  Instructions: . Oral Intake: Do not eat or drink anything for at least 3 hours prior to your procedure. (Exception: Blood Pressure Medication. See below.) . Transportation: Unless otherwise stated by your physician, you may drive yourself after the procedure. . Blood Pressure Medicine: Do not forget to take your blood pressure medicine with a sip of water the morning of the procedure. If your Diastolic (lower reading)is above 100 mmHg, elective cases will be cancelled/rescheduled. . Blood thinners: These will need to be stopped for procedures. Notify our staff if you are taking any blood thinners. Depending on which one you take, there will be specific instructions on how and when to stop it. . Diabetics on insulin: Notify the staff so that you can be scheduled 1st case in the morning. If your diabetes requires high dose insulin, take only  of your normal insulin dose the morning of the procedure and notify the staff that you have done so. . Preventing infections: Shower with an antibacterial soap the morning of your procedure. . Build-up your immune system: Take 1000 mg of Vitamin C with every meal (3 times a day) the day prior to your procedure. . Antibiotics: Inform the staff if you have a condition or reason that requires you to take antibiotics before dental procedures. . Pregnancy: If you are pregnant, call and cancel the procedure. . Sickness: If you have a cold, fever, or any active infections, call and cancel the procedure. . Arrival: You must be in the facility at least 30 minutes prior to your scheduled procedure. . Children: Do not bring children with you. . Dress appropriately:  Bring dark clothing that you would not mind if they get stained. . Valuables: Do not bring any jewelry or valuables.  Reasons to call and reschedule or cancel your procedure: (Following these recommendations will minimize the risk of a serious complication.) . Surgeries: Avoid having procedures within 2 weeks of any surgery. (Avoid for 2 weeks before or after any surgery). . Flu Shots: Avoid having procedures within 2 weeks of a flu shots or . (Avoid for 2 weeks before or after immunizations). . Barium: Avoid having a procedure within 7-10 days after having had a radiological study involving the use of radiological contrast. (Myelograms, Barium swallow or enema study). . Heart attacks: Avoid any elective procedures or surgeries for the initial 6 months after a "Myocardial Infarction" (Heart Attack). . Blood thinners: It is imperative that you stop these medications before procedures. Let us know if you if you take any blood thinner.  . Infection: Avoid procedures during or within two weeks of an infection (including chest colds or gastrointestinal problems). Symptoms associated with infections include: Localized redness, fever, chills, night sweats or profuse sweating, burning sensation when voiding, cough, congestion, stuffiness, runny nose, sore throat, diarrhea, nausea, vomiting, cold or Flu symptoms, recent or current infections. It is specially important if the infection is over the area that we intend to treat. . Heart and lung problems: Symptoms that may suggest an active cardiopulmonary problem include: cough, chest pain, breathing difficulties or shortness of breath, dizziness, ankle swelling, uncontrolled high or unusually low blood pressure, and/or palpitations. If you are experiencing any of these symptoms, cancel your procedure and contact your primary care physician for an evaluation.  Remember:  Regular Business hours are:    Monday to Thursday 8:00 AM to 4:00 PM  Provider's  Schedule: Davon Abdelaziz, MD:  Procedure days: Tuesday and Thursday 7:30 AM to 4:00 PM  Bilal Lateef, MD:  Procedure days: Monday and Wednesday 7:30 AM to 4:00 PM ____________________________________________________________________________________________    

## 2020-02-11 NOTE — Progress Notes (Signed)
Patient: Shannon Benitez  Service Category: E/M  Provider: [name], MD  DOB: 03/19/1971  DOS: 02/11/2020  Location: Office  MRN: [id number]  Setting: Ambulatory outpatient  Referring Provider: Marygrace Drought, MD  Type: Established Patient  Specialty: Interventional Pain Management  PCP: Marygrace Drought, MD  Location: Remote location  Delivery: TeleHealth     Virtual Encounter - Pain Management PROVIDER NOTE: Information contained herein reflects review and annotations entered in association with encounter. Interpretation of such information and data should be left to medically-trained personnel. Information provided to patient can be located elsewhere in the medical record under "Patient Instructions". Document created using STT-dictation technology, any transcriptional errors that may result from process are unintentional.    Contact & Pharmacy Preferred: [phone] Home: [phone] (home) Mobile: [phone] (mobile) E-mail: [email or web address], [place] [address] Phone: [phone] Fax: [phone]   Pre-screening  Ms. Calcaterra offered "in-person" vs "virtual" encounter. She indicated preferring virtual for this encounter.   Reason COVID-19*  Social distancing based on CDC and AMA recommendations.   I contacted Saxon Jiron on 02/11/2020 via telephone.      I clearly identified myself as [name], MD. I verified that I was speaking with the correct person using two identifiers (Name: Taeko Schaffer, and date of birth: 10/02/1971).  Consent I sought verbal advanced consent from [facility] for virtual visit interactions. I informed Ms. Burkey of possible security and privacy concerns, risks, and limitations associated with providing "not-in-person" medical evaluation and management services. I also informed Ms. Leach of the availability of "in-person" appointments.  Finally, I informed her that there would be a charge for the virtual visit and that she could be  personally, fully or partially, financially responsible for it. Ms. Angst expressed understanding and agreed to proceed.   Historic Elements   Ms. Betzy Barbier is a 49 y.o. year old, female patient evaluated today after her last contact with our practice on 11/18/2019. Ms. Lerman  has a past medical history of Acute postoperative pain (04/30/2019), Diabetes mellitus without complication ([place]), GERD (gastroesophageal reflux disease), Headache, MS (multiple sclerosis) ([place]), Numbness and tingling of both legs below knees, Osteoarthritis, Vertigo, and Wears dentures. She also  has a past surgical history that includes Abdominal hysterectomy; Cesarean section; Tumor removal (Left); Cataract extraction w/PHACO (Right, 03/18/2019); and Cataract extraction w/PHACO (Left, 04/13/2019). Ms. Gills has a current medication list which includes the following prescription(s): accu-chek softclix lancets, aspirin-acetaminophen-caffeine, [START ON 02/18/2020] baclofen, dimethyl fumarate, duloxetine, lamotrigine, loperamide, [START ON 02/18/2020] meloxicam, metformin, mirabegron er, omeprazole, [START ON 02/18/2020] oxycodone, [START ON 03/19/2020] oxycodone, [START ON 04/18/2020] oxycodone, [START ON 02/18/2020] pregabalin, primidone, propranolol, and sitagliptin. She  reports that she quit smoking about 3 years ago. Her smoking use included cigarettes. She has a 9.00 pack-year smoking history. She has quit using smokeless tobacco. She reports that she does not drink alcohol. No history on file for drug. Ms. Deller is allergic to 5-alpha reductase inhibitors; glatiramer; rebif [interferon beta-1a]; and latex.   HPI  Today, she is being contacted for medication management. The patient indicates doing well with the current medication regimen. No adverse reactions or side effects reported to the medications.   Unfortunately, the patient indicates that since the last time that we talk to her, she has had a couple falls.  The worst of them was when she rolled in bed and fell to the floor hitting her back.  This has aggravated her low back pain.  She indicates that her primary pain right now is the low back, bilaterally, with the pain in the midline and right side more than the left.  In addition, she has been experiencing some right lower extremity pain that goes through the back of the leg down to the level of the knee with some right groin pain.  She has requested that we bring her in for treatment.  Today we asked the patient to perform some provocative maneuvers and she was positive for hyperextension on rotation indicating problems with the facet joints.  This maneuver identically reproduced her pain.  In view of this, we will be scheduling her to have a diagnostic/therapeutic bilateral lumbar facet block under fluoroscopic guidance and IV sedation.  She had a bilateral lumbar facet RFA done in December 2020 with excellent results until recently when she had that fall and aggravated her back pain.  Pharmacotherapy Assessment  Analgesic: Oxycodone IR 5 mg, 1 tab PO BID (10 mg/day of oxycodone)  MME/day:58m/day.   Monitoring: [place] PMP: PDMP reviewed during this encounter.       Pharmacotherapy: No side-effects or adverse reactions reported. Compliance: No problems identified. Effectiveness: Clinically acceptable. Plan: Refer to "POC".  UDS:  Summary  Date Value Ref Range Status  11/27/2018 FINAL  Final    Comment:    ==================================================================== [facility] COMP DRUG ANALYSIS,UR ==================================================================== Test                             Result       Flag       Units Drug Present and Declared for Prescription Verification   7-aminoclonazepam              319          EXPECTED   ng/mg creat    7-aminoclonazepam is an  expected metabolite of clonazepam. Source    of clonazepam is a scheduled prescription medication.   Primidone                      PRESENT      EXPECTED   Phenobarbital                  PRESENT      EXPECTED    Phenobarbital is an expected metabolite of primidone;    Phenobarbital may also be administered as a prescription drug.   Gabapentin                     PRESENT      EXPECTED   Lamotrigine                    PRESENT      EXPECTED   Baclofen                       PRESENT      EXPECTED   Duloxetine                     PRESENT      EXPECTED   Propranolol                    PRESENT      EXPECTED Drug Present not Declared for Prescription Verification   Metaxalone  PRESENT      UNEXPECTED   Acetaminophen                  PRESENT      UNEXPECTED   Diphenhydramine                PRESENT      UNEXPECTED ==================================================================== Test                      Result    Flag   Units      Ref Range   Creatinine              47               mg/dL      >=20 ==================================================================== Declared Medications:  The flagging and interpretation on this report are based on the  following declared medications.  Unexpected results may arise from  inaccuracies in the declared medications.  **Note: The testing scope of this panel includes these medications:  Baclofen (Lioresal)  Clonazepam (Klonopin)  Duloxetine (Cymbalta)  Gabapentin (Neurontin)  Lamotrigine (Lamictal)  Primidone (Mysoline)  Propranolol (Inderal)  **Note: The testing scope of this panel does not include following  reported medications:  Dimethyl Fumarate  Metformin (Glucophage)  Mirabegron (Myrbetriq)  Omeprazole (Prilosec)  Sitagliptin (Januvia) ==================================================================== For clinical consultation, please call [phone]. ====================================================================    Laboratory Chemistry Profile   Renal Lab Results  Component Value Date   BUN 13 09/25/2019   CREATININE 0.53 09/25/2019   BCR 13 11/27/2018   GFRAA >60 09/25/2019   GFRNONAA >60 09/25/2019     Hepatic Lab Results  Component Value Date   AST 17 11/27/2018   ALT 35 08/18/2018   ALBUMIN 4.1 11/27/2018   ALKPHOS 91 11/27/2018     Electrolytes Lab Results  Component Value Date   NA 136 09/25/2019   K 3.8 09/25/2019   CL 101 09/25/2019   CALCIUM 8.8 (L) 09/25/2019   MG 2.2 11/27/2018     Bone Lab Results  Component Value Date   25OHVITD1 55 11/27/2018   25OHVITD2 <1.0 11/27/2018   25OHVITD3 55 11/27/2018     Inflammation (CRP: Acute Phase) (ESR: Chronic Phase) Lab Results  Component Value Date   CRP 10 11/27/2018   ESRSEDRATE 42 (H) 11/27/2018       Note: Above Lab results reviewed.  Imaging  Fluoro (C-Arm) (<60 min) (No Report) Fluoro was used, but no Radiologist interpretation will be provided.  Please refer to "NOTES" tab for provider progress note.  Assessment  The primary encounter diagnosis was Chronic low back pain (Bilateral) (L>R) w/o sciatica. Diagnoses of Lumbar facet syndrome (Bilateral) (R>L), Osteoarthritis of lumbar spine, Chronic pain syndrome, Neurogenic pain, Muscle spasticity, and Diabetic peripheral neuropathy ([place]) were also pertinent to this visit.  Plan of Care  Problem-specific:  No problem-specific Assessment & Plan notes found for this encounter.  Ms. Lucyann Romano has a current medication list which includes the following long-term medication(s): [START ON 02/18/2020] baclofen, duloxetine, lamotrigine, [START ON 02/18/2020] meloxicam, metformin, omeprazole, [START ON 02/18/2020] oxycodone, [START ON 03/19/2020] oxycodone, [START ON 04/18/2020] oxycodone, [START ON 02/18/2020] pregabalin, primidone, propranolol, and sitagliptin.  Pharmacotherapy  (Medications Ordered): Meds ordered this encounter  Medications  . oxyCODONE (OXY IR/ROXICODONE) 5 MG immediate release tablet    Sig: Take 1 tablet (5 mg total) by mouth 2 (two) times daily as needed for severe pain. Must last 30 days.    Dispense:  60 tablet    Refill:  0    Chronic Pain: STOP Act (Not applicable) Fill 1 day early if closed on refill date. Do not fill until: 02/18/2020. To last until: 03/19/2020. Avoid benzodiazepines within 8 hours of opioids  . oxyCODONE (OXY IR/ROXICODONE) 5 MG immediate release tablet    Sig: Take 1 tablet (5 mg total) by mouth 2 (two) times daily as needed for severe pain. Must last 30 days.    Dispense:  60 tablet    Refill:  0    Chronic Pain: STOP Act (Not applicable) Fill 1 day early if closed on refill date. Do not fill until: 03/19/2020. To last until: 04/18/2020. Avoid benzodiazepines within 8 hours of opioids  . oxyCODONE (OXY IR/ROXICODONE) 5 MG immediate release tablet    Sig: Take 1 tablet (5 mg total) by mouth 2 (two) times daily as needed for severe pain. Must last 30 days.    Dispense:  60 tablet    Refill:  0    Chronic Pain: STOP Act (Not applicable) Fill 1 day early if closed on refill date. Do not fill until: 04/18/2020. To last until: 05/18/2020. Avoid benzodiazepines within 8 hours of opioids  . pregabalin (LYRICA) 150 MG capsule    Sig: Take 1 capsule (150 mg total) by mouth 3 (three) times daily.    Dispense:  90 capsule    Refill:  5    Fill one day early if pharmacy is closed on scheduled refill date. May substitute for generic if available.  . baclofen (LIORESAL) 20 MG tablet    Sig: Take 1 tablet (20 mg total) by mouth 4 (four) times daily.    Dispense:  120 tablet    Refill:  5    Fill one day early if pharmacy is closed on scheduled refill date. May substitute for generic if available.  . meloxicam (MOBIC) 15 MG tablet    Sig: Take 1 tablet (15 mg total) by mouth daily.    Dispense:  30 tablet    Refill:  5    Fill one day  early if pharmacy is closed on scheduled refill date. May substitute for generic if available.   Orders:  Orders Placed This Encounter  Procedures  . LUMBAR FACET(MEDIAL BRANCH NERVE BLOCK) MBNB    Standing Status:   Future    Standing Expiration Date:   03/12/2020    Scheduling Instructions:     Procedure: Lumbar facet block (AKA.: Lumbosacral medial branch nerve block)     Side: Bilateral     Level: L3-4, L4-5, & L5-S1 Facets (L2, L3, L4, L5, & S1 Medial Branch Nerves)     Sedation: Patient's choice.     Timeframe: ASAA    Order Specific Question:   Where will this procedure be performed?    Answer:   [facility]   Follow-up plan:   Return in about 3 months (around 05/18/2020) for (F2F), (MM), in addition, Procedure (w/ sedation): (B) L-FCT Blk, (ASAP).      Interventional management options: Considering:   Continue encouraging the patient to bring her BMI below 35. Diagnostic bilateral T11-12 lumbar facet block   Diagnostic right T7-8 TESI   Diagnostic right T7 TFESI   Diagnostic ML CESI     Palliative PRN treatment(s):   Palliative bilateral lumbar facet block #4  Palliative left lumbar facet RFA #3 (last done on 10/27/2019)  Palliative right lumbar facet RFA #3 (last done on 10/08/2019)  Recent Visits Date Type Provider Dept  11/19/19 Telemedicine Milinda Pointer, MD [facility]  Showing recent visits within past 90 days and meeting all other requirements   Today's Visits Date Type Provider Dept  02/11/20 Telemedicine Milinda Pointer, MD [facility]  Showing today's visits and meeting all other requirements   Future Appointments No visits were found meeting these conditions.  Showing future appointments within next 90 days and meeting all other requirements   I discussed the assessment and treatment plan with the patient. The patient was provided an opportunity to ask questions and all were answered. The patient agreed with the  plan and demonstrated an understanding of the instructions.  Patient advised to call back or seek an in-person evaluation if the symptoms or condition worsens.  Duration of encounter: 15 minutes.  Note by: [name], MD Date: 02/11/2020; Time: 9:40 AM

## 2020-02-14 ENCOUNTER — Emergency Department: Payer: Medicaid Other

## 2020-02-14 ENCOUNTER — Other Ambulatory Visit: Payer: Self-pay

## 2020-02-14 ENCOUNTER — Emergency Department
Admission: EM | Admit: 2020-02-14 | Discharge: 2020-02-14 | Disposition: A | Discharge status: Home / Self Care | Payer: Medicaid Other | Attending: Emergency Medicine | Admitting: Emergency Medicine

## 2020-02-14 DIAGNOSIS — Z79899 Other long term (current) drug therapy: Secondary | ICD-10-CM | POA: Diagnosis not present

## 2020-02-14 DIAGNOSIS — S59901A Unspecified injury of right elbow, initial encounter: Secondary | ICD-10-CM | POA: Diagnosis present

## 2020-02-14 DIAGNOSIS — E119 Type 2 diabetes mellitus without complications: Secondary | ICD-10-CM | POA: Diagnosis not present

## 2020-02-14 DIAGNOSIS — S8001XA Contusion of right knee, initial encounter: Secondary | ICD-10-CM | POA: Diagnosis not present

## 2020-02-14 DIAGNOSIS — S5001XA Contusion of right elbow, initial encounter: Secondary | ICD-10-CM | POA: Diagnosis not present

## 2020-02-14 DIAGNOSIS — Z87891 Personal history of nicotine dependence: Secondary | ICD-10-CM | POA: Diagnosis not present

## 2020-02-14 DIAGNOSIS — Z7984 Long term (current) use of oral hypoglycemic drugs: Secondary | ICD-10-CM | POA: Diagnosis not present

## 2020-02-14 DIAGNOSIS — Y999 Unspecified external cause status: Secondary | ICD-10-CM | POA: Diagnosis not present

## 2020-02-14 DIAGNOSIS — W19XXXA Unspecified fall, initial encounter: Secondary | ICD-10-CM

## 2020-02-14 DIAGNOSIS — Y939 Activity, unspecified: Secondary | ICD-10-CM | POA: Insufficient documentation

## 2020-02-14 DIAGNOSIS — Y929 Unspecified place or not applicable: Secondary | ICD-10-CM | POA: Insufficient documentation

## 2020-02-14 DIAGNOSIS — Z91040 Latex allergy status: Secondary | ICD-10-CM | POA: Diagnosis not present

## 2020-02-14 DIAGNOSIS — W010XXA Fall on same level from slipping, tripping and stumbling without subsequent striking against object, initial encounter: Secondary | ICD-10-CM | POA: Insufficient documentation

## 2020-02-14 DIAGNOSIS — G35 Multiple sclerosis: Secondary | ICD-10-CM | POA: Insufficient documentation

## 2020-02-14 NOTE — ED Notes (Signed)
See triage note  Presents with pain to right knee and elbow  States she fell yesterday  Min swelling noted to right elbow and abrasion noted to knee

## 2020-02-14 NOTE — ED Notes (Signed)
Signature pad not working, pt verbalizes consent for discharge, understanding of d/c instructions, denies further questions or concerns at this time

## 2020-02-14 NOTE — ED Triage Notes (Signed)
Pt c/o falling yesterday and having pain to the right knee and elbow, pt is ambulatory to triage with a steady gait.

## 2020-02-14 NOTE — Discharge Instructions (Addendum)
Follow-up with Dr. Mack Guise if you have any continued problems with your knee.  You may apply ice to your elbow and your knee to reduce any swelling and help with pain.  Apply your knee immobilizer to your right knee when you get home to protect and give extra support.  Elevate your knee.  Continue taking your regular medications including your pain medication for your knee and elbow.  There was on your knee x-ray a osteochondroma which is a benign overgrowth of bone however if you continue having problems the orthopedist may order an MRI of your knee.

## 2020-02-14 NOTE — ED Provider Notes (Signed)
[facility] Emergency Department Provider Note   ____________________________________________   First MD Initiated Contact with Patient 02/14/20 1020     (approximate)  I have reviewed the triage vital signs and the nursing notes.   HISTORY  Chief Complaint Fall   HPI Shannon Benitez is a 49 y.o. female presents to the ED with complaint of right elbow and right knee pain after falling yesterday.  Patient states that she falls frequently because of her MS.  She states that often she becomes unbalanced.  She denies any head injury or loss of consciousness.  She is concerned that she may have fractured especially her right knee.  She rates her pain as 5 out of 10.       Past Medical History:  Diagnosis Date  . Acute postoperative pain 04/30/2019  . Diabetes mellitus without complication ([place])   . GERD (gastroesophageal reflux disease)   . Headache    daily  . MS (multiple sclerosis) ([place])   . Numbness and tingling of both legs below knees    pt reports secondary to MS  . Osteoarthritis    lumbar  . Vertigo    last episode opver 18 yrs ago  . Wears dentures    full upper and lower    Patient Active Problem List   Diagnosis Date Noted  . Chronic diarrhea 11/17/2019  . Iron deficiency anemia 11/17/2019  . Overactive bladder 11/17/2019  . Vitamin B 12 deficiency 11/17/2019  . 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) 08/17/2019  . Abnormal MRI, cervical spine (07/22/2019) 08/17/2019  . Diabetic peripheral neuropathy ([place]) 02/23/2019  . Morbid obesity with BMI of 40.0-44.9, adult ([place]) 01/19/2019  . Spondylosis without myelopathy or radiculopathy, lumbosacral region 01/01/2019  . Lumbar facet syndrome (Bilateral) (R>L) 01/01/2019  . Abnormal MRI, lumbar spine (07/02/2017) 01/01/2019  . Lumbar facet hypertrophy (Multilevel) (Bilateral) 01/01/2019  . Osteoarthritis of facet joint of lumbar spine 01/01/2019  . Osteoarthritis of  lumbar spine 01/01/2019  . DDD (degenerative disc disease), lumbosacral 01/01/2019  . Lumbar spondylosis 01/01/2019  . Chronic low back pain (Bilateral) (L>R) w/o sciatica 12/17/2018  . Muscle spasticity 12/17/2018  . Chronic pain syndrome 11/27/2018  . Chronic low back pain (Primary area of Pain) (Bilateral) (L>R) w/ sciatica (Bilateral) 11/27/2018  . Chronic lower extremity pain (Secondary Area of Pain) (Bilateral) (R>L) 11/27/2018  . Long term prescription benzodiazepine use 11/27/2018  . Pharmacologic therapy 11/27/2018  . Disorder of skeletal system 11/27/2018  . Problems influencing health status 11/27/2018  . Anemia 05/19/2018  . Diabetes mellitus type 2 in obese ([place]) 02/19/2018  . Anxiety 01/14/2018  . Situational depression 01/14/2018  . Neurogenic pain 01/14/2018  . Vitamin D deficiency 01/14/2018  . Medication management 06/10/2017  . Sacroiliac pain 01/30/2017  . Generalized seizure disorder ([place]) 11/23/2016  . Multiple sclerosis ([place]) 11/23/2016  . Tremor 11/23/2016    Past Surgical History:  Procedure Laterality Date  . ABDOMINAL HYSTERECTOMY    . CATARACT EXTRACTION W/PHACO Right 03/18/2019   Procedure: CATARACT EXTRACTION PHACO AND INTRAOCULAR LENS PLACEMENT ([place]) RIGHT;  Surgeon: Eulogio Bear, MD;  Location: [facility];  Service: Ophthalmology;  Laterality: Right;  Diabetic - oral meds Latex sensitivity  . CATARACT EXTRACTION W/PHACO Left 04/13/2019   Procedure: CATARACT EXTRACTION PHACO AND INTRAOCULAR LENS PLACEMENT ([place])  LEFT;  Surgeon: Eulogio Bear, MD;  Location: [place] [facility];  Service: Ophthalmology;  Laterality: Left;  Diabetic - oral meds  . CESAREAN SECTION    .  TUMOR REMOVAL Left    pt was 49 years old    Prior to Admission medications   Medication Sig Start Date End Date Taking? Authorizing Provider  Accu-Chek Softclix Lancets lancets 1 each by Other route 2 (two) times a day. 04/03/19 04/02/20  [provider]    aspirin-acetaminophen-caffeine (EXCEDRIN MIGRAINE) [phone] MG tablet Take by mouth every 6 (six) hours as needed for headache.    [provider]  baclofen (LIORESAL) 20 MG tablet Take 1 tablet (20 mg total) by mouth 4 (four) times daily. 02/18/20 08/16/20  Milinda Pointer, MD  Dimethyl Fumarate (TECFIDERA) 240 MG CPDR Take 240 mg by mouth 2 (two) times daily.  08/06/19   [provider]  DULoxetine (CYMBALTA) 60 MG capsule Take 60 mg by mouth daily.    [provider]  lamoTRIgine (LAMICTAL) 25 MG tablet Take 50 mg by mouth 2 (two) times daily. 11/23/16   [provider]  loperamide (IMODIUM A-D) 2 MG tablet Take 2 mg by mouth as needed for diarrhea or loose stools.    [provider]  meloxicam (MOBIC) 15 MG tablet Take 1 tablet (15 mg total) by mouth daily. 02/18/20 08/16/20  Milinda Pointer, MD  metFORMIN (GLUCOPHAGE-XR) 500 MG 24 hr tablet Take 250 mg by mouth daily with breakfast.  04/03/19 04/02/20  [provider]  mirabegron ER (MYRBETRIQ) 50 MG TB24 tablet Take 50 mg by mouth daily.  02/13/19   [provider]  omeprazole (PRILOSEC) 20 MG capsule Take 20 mg by mouth daily. 11/23/16 02/10/20  [provider]  oxyCODONE (OXY IR/ROXICODONE) 5 MG immediate release tablet Take 1 tablet (5 mg total) by mouth 2 (two) times daily as needed for severe pain. Must last 30 days. 02/18/20 03/19/20  Milinda Pointer, MD  oxyCODONE (OXY IR/ROXICODONE) 5 MG immediate release tablet Take 1 tablet (5 mg total) by mouth 2 (two) times daily as needed for severe pain. Must last 30 days. 03/19/20 04/18/20  Milinda Pointer, MD  oxyCODONE (OXY IR/ROXICODONE) 5 MG immediate release tablet Take 1 tablet (5 mg total) by mouth 2 (two) times daily as needed for severe pain. Must last 30 days. 04/18/20 05/18/20  Milinda Pointer, MD  pregabalin (LYRICA) 150 MG capsule Take 1 capsule (150 mg total) by mouth 3 (three) times daily. 02/18/20 08/16/20   Milinda Pointer, MD  primidone (MYSOLINE) 50 MG tablet Take 50 mg by mouth daily. 03/06/19 03/05/20  [provider]  propranolol (INDERAL) 40 MG tablet Take 40 mg by mouth 3 (three) times daily. 12/05/17   [provider]  sitaGLIPtin (JANUVIA) 100 MG tablet Take 100 mg by mouth daily.  04/03/19 04/02/20  [provider]    Allergies 5-alpha reductase inhibitors, Glatiramer, Rebif [interferon beta-1a], and Latex  Family History  Adopted: Yes    Social History Social History   Tobacco Use  . Smoking status: Former Smoker    Packs/day: 0.50    Years: 18.00    Pack years: 9.00    Types: Cigarettes    Quit date: 12/19/2016    Years since quitting: 3.1  . Smokeless tobacco: Former Network engineer Use Topics  . Alcohol use: Never  . Drug use: Not on file    Review of Systems Constitutional: No fever/chills Eyes: No visual changes. ENT: No injury. Cardiovascular: Denies chest pain. Respiratory: Denies shortness of breath. Gastrointestinal: No abdominal pain.  No nausea, no vomiting.  Genitourinary: Negative for dysuria. Musculoskeletal: Positive for right knee and elbow pain.  Skin: Positive abrasions. Neurological: Negative for headaches, focal weakness or numbness. ____________________________________________   PHYSICAL EXAM:  VITAL SIGNS: ED Triage Vitals  Enc Vitals Group     BP 02/14/20 0925 128/72     Pulse Rate 02/14/20 0925 73     Resp 02/14/20 0925 17     Temp 02/14/20 0925 97.7 F (36.5 C)     Temp Source 02/14/20 0925 Oral     SpO2 02/14/20 0925 99 %     Weight 02/14/20 0922 185 lb (83.9 kg)     Height 02/14/20 0922 5\' 1"  (1.549 m)     Head Circumference --      Peak Flow --      Pain Score 02/14/20 0922 5     Pain Loc --      Pain Edu? --      Excl. in [place]? --     Constitutional: Alert and oriented. Well appearing and in no acute distress. Eyes: Conjunctivae are normal. PERRL. EOMI. Head: Atraumatic. Nose: No  trauma. Mouth/Throat: No trauma. Neck: No stridor.  No cervical tenderness on palpation posteriorly. Cardiovascular: Normal rate, regular rhythm. Grossly normal heart sounds.  Good peripheral circulation. Respiratory: Normal respiratory effort.  No retractions. Lungs CTAB. Musculoskeletal: On examination of the right elbow there is no gross deformity or soft tissue edema.  Range of motion is slow and guarded.  No effusion is appreciated.  Motor sensory function intact.  Pulses present.  There are a few very superficial abrasions noted to the posterior olecranon area.  No foreign body noted.  On examination of the right knee there is soft tissue edema present but no effusion.  Ligaments are stable.  Range of motion is slow but patient is able to flex and extend.  Very superficial abrasions are noted to the anterior knee.  Nontender hips or ankles.  On palpation of the thoracic spine patient is extremely tender but states that this is normal and she currently is seeing a pain clinic for this.  She denies any injury to this area. Neurologic:  Normal speech and language. No gross focal neurologic deficits are appreciated.  Skin:  Skin is warm, dry. Psychiatric: Mood and affect are normal. Speech and behavior are normal.  ____________________________________________   LABS (all labs ordered are listed, but only abnormal results are displayed)  Labs Reviewed - No data to display  RADIOLOGY   Official radiology report(s): DG Elbow Complete Right  Result Date: 02/14/2020 CLINICAL DATA:  Right elbow pain after fall EXAM: RIGHT ELBOW - COMPLETE 3+ VIEW COMPARISON:  None. FINDINGS: There is no evidence of fracture, dislocation, or joint effusion. There is no evidence of arthropathy. Soft tissues are unremarkable. IMPRESSION: Negative. Electronically Signed   By: Davina Poke D.O.   On: 02/14/2020 11:15   DG Knee Complete 4 Views Right  Result Date: 02/14/2020 CLINICAL DATA:  Right knee pain after  fall EXAM: RIGHT KNEE - COMPLETE 4+ VIEW COMPARISON:  None. FINDINGS: No evidence of fracture, dislocation, or joint effusion. Joint spaces are maintained. Mild medial tibiofemoral compartment osteophyte formation. 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 Additional probable sessile osteochondroma emanating from the medial aspect of the proximal fibular metaphysis. Soft tissues are unremarkable. IMPRESSION: 1. No acute osseous abnormality. 2. 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 Nonemergent MRI could be considered if there is pain referable to these locations. Electronically Signed  By: Davina Poke D.O.   On: 02/14/2020 11:14    ____________________________________________   PROCEDURES  Procedure(s) performed (including Critical Care):  Procedures   ____________________________________________   INITIAL IMPRESSION / ASSESSMENT AND PLAN / ED COURSE  As part of my medical decision making, I reviewed the following data within the electronic MEDICAL RECORD NUMBER Notes from prior ED visits and [place] Controlled Substance Database  49 year old female presents to the ED with concerns of injury to her right knee and elbow after she fell yesterday.  Patient states that she lost her balance due to her MS.  She states that she falls often due to her MS.  Patient has continued to ambulate since her accident.  She denies any head injury or loss of consciousness.  There are a few superficial abrasions noted to the right elbow and knee and patient was made aware that she should clean these daily with mild soap and water and watch for any signs of infection.  X-rays were negative for any acute bony injury.  Patient was reassured.  She currently has pain medication at home from the pain clinic and will continue doing this.  Patient states she also has a knee  immobilizer at home that she will begin wearing.  She is to follow-up with her PCP if any continued problems or return to the emergency department if any severe worsening of her symptoms.  ____________________________________________   FINAL CLINICAL IMPRESSION(S) / ED DIAGNOSES  Final diagnoses:  Contusion of right elbow, initial encounter  Contusion of right knee, initial encounter  Fall, initial encounter     ED Discharge Orders    None       Note:  This document was prepared using Dragon voice recognition software and may include unintentional dictation errors.    Johnn Hai, PA-C 02/14/20 1607    Harvest Dark, MD 02/15/20 1347

## 2020-02-17 ENCOUNTER — Telehealth: Payer: Medicaid Other | Admitting: Pain Medicine

## 2020-03-03 ENCOUNTER — Ambulatory Visit
Admission: RE | Admit: 2020-03-03 | Discharge: 2020-03-03 | Disposition: A | Discharge status: Home / Self Care | Payer: Medicaid Other | Source: Ambulatory Visit | Attending: Pain Medicine | Admitting: Pain Medicine

## 2020-03-03 ENCOUNTER — Encounter: Payer: Self-pay | Admitting: Pain Medicine

## 2020-03-03 ENCOUNTER — Other Ambulatory Visit: Payer: Self-pay

## 2020-03-03 ENCOUNTER — Ambulatory Visit (HOSPITAL_BASED_OUTPATIENT_CLINIC_OR_DEPARTMENT_OTHER): Payer: Medicaid Other | Admitting: Pain Medicine

## 2020-03-03 ENCOUNTER — Ambulatory Visit: Payer: Medicaid Other | Admitting: Pain Medicine

## 2020-03-03 VITALS — BP 110/67 | HR 65 | Temp 98.1°F | Resp 17 | Ht 61.0 in | Wt 195.0 lb

## 2020-03-03 DIAGNOSIS — G8929 Other chronic pain: Secondary | ICD-10-CM

## 2020-03-03 DIAGNOSIS — M545 Low back pain: Secondary | ICD-10-CM | POA: Insufficient documentation

## 2020-03-03 DIAGNOSIS — M47816 Spondylosis without myelopathy or radiculopathy, lumbar region: Secondary | ICD-10-CM | POA: Diagnosis not present

## 2020-03-03 DIAGNOSIS — M47817 Spondylosis without myelopathy or radiculopathy, lumbosacral region: Secondary | ICD-10-CM | POA: Diagnosis present

## 2020-03-03 DIAGNOSIS — M5137 Other intervertebral disc degeneration, lumbosacral region: Secondary | ICD-10-CM

## 2020-03-03 MED ORDER — LACTATED RINGERS IV SOLN
1000.0000 mL | Freq: Once | INTRAVENOUS | Status: AC
  Administered 2020-03-03: 1000 mL via INTRAVENOUS

## 2020-03-03 MED ORDER — MIDAZOLAM HCL 5 MG/5ML IJ SOLN
1.0000 mg | INTRAMUSCULAR | Status: DC | PRN
  Administered 2020-03-03 (×3): 1 mg via INTRAVENOUS
  Filled 2020-03-03: qty 5

## 2020-03-03 MED ORDER — ROPIVACAINE HCL 2 MG/ML IJ SOLN
18.0000 mL | Freq: Once | INTRAMUSCULAR | Status: AC
  Administered 2020-03-03: 18 mL via PERINEURAL
  Filled 2020-03-03: qty 20

## 2020-03-03 MED ORDER — FENTANYL CITRATE (PF) 100 MCG/2ML IJ SOLN
25.0000 ug | INTRAMUSCULAR | Status: DC | PRN
  Administered 2020-03-03: 50 ug via INTRAVENOUS
  Filled 2020-03-03: qty 2

## 2020-03-03 MED ORDER — LIDOCAINE HCL 2 % IJ SOLN
20.0000 mL | Freq: Once | INTRAMUSCULAR | Status: AC
  Administered 2020-03-03: 400 mg
  Filled 2020-03-03: qty 20

## 2020-03-03 MED ORDER — ROPIVACAINE HCL 2 MG/ML IJ SOLN
INTRAMUSCULAR | Status: AC
  Filled 2020-03-03: qty 10

## 2020-03-03 MED ORDER — TRIAMCINOLONE ACETONIDE 40 MG/ML IJ SUSP
80.0000 mg | Freq: Once | INTRAMUSCULAR | Status: AC
  Administered 2020-03-03: 80 mg
  Filled 2020-03-03: qty 2

## 2020-03-03 NOTE — Progress Notes (Signed)
Safety precautions to be maintained throughout the outpatient stay will include: orient to surroundings, keep bed in low position, maintain call bell within reach at all times, provide assistance with transfer out of bed and ambulation.  

## 2020-03-03 NOTE — Patient Instructions (Signed)

## 2020-03-03 NOTE — Progress Notes (Signed)
PROVIDER NOTE: Information contained herein reflects review and annotations entered in association with encounter. Interpretation of such information and data should be left to medically-trained personnel. Information provided to patient can be located elsewhere in the medical record under "Patient Instructions". Document created using STT-dictation technology, any transcriptional errors that may result from process are unintentional.    Patient: Shannon Benitez  Service Category: Procedure  Provider: [name], MD  DOB: [birth year]  DOS: 03/03/2020  Location: [facility]  MRN: [id number]  Setting: Ambulatory - outpatient  Referring Provider: Marygrace Drought, MD  Type: Established Patient  Specialty: Interventional Pain Management  PCP: Marygrace Drought, MD   Primary Reason for Visit: Interventional Pain Management Treatment. CC: Back Pain (lower)  Procedure:          Anesthesia, Analgesia, Anxiolysis:  Type: Lumbar Facet, Medial Branch Block(s) #4  Primary Purpose: Diagnostic Region: Posterolateral Lumbosacral Spine Level: L2, L3, L4, L5, & S1 Medial Branch Level(s). Injecting these levels blocks the L3-4, L4-5, and L5-S1 lumbar facet joints. Laterality: Bilateral  Type: Moderate (Conscious) Sedation combined with Local Anesthesia Indication(s): Analgesia and Anxiety Route: Intravenous (IV) IV Access: Secured Sedation: Meaningful verbal contact was maintained at all times during the procedure  Local Anesthetic: Lidocaine 1-2%  Position: Prone   Indications: 1. Lumbar facet syndrome (Bilateral) (R>L)   2. Lumbar facet hypertrophy (Multilevel) (Bilateral)   3. Spondylosis without myelopathy or radiculopathy, lumbosacral region   4. DDD (degenerative disc disease), lumbosacral   5. Chronic low back pain (Bilateral) (L>R) w/o sciatica    Pain Score: Pre-procedure: 6 /10 Post-procedure: 0-No pain/10   Pre-op Assessment:  Shannon Benitez is a 49 y.o. (year  old), female patient, seen today for interventional treatment. She  has a past surgical history that includes Abdominal hysterectomy; Cesarean section; Tumor removal (Left); Cataract extraction w/PHACO (Right, 03/18/2019); and Cataract extraction w/PHACO (Left, 04/13/2019). Shannon Benitez has a current medication list which includes the following prescription(s): accu-chek softclix lancets, aspirin-acetaminophen-caffeine, baclofen, dimethyl fumarate, duloxetine, lamotrigine, levetiracetam, loperamide, meloxicam, metformin, mirabegron er, oxycodone, [START ON 03/19/2020] oxycodone, [START ON 04/18/2020] oxycodone, pregabalin, primidone, propranolol, sitagliptin, and omeprazole, and the following Facility-Administered Medications: fentanyl and midazolam. Her primarily concern today is the Back Pain (lower)  Initial Vital Signs:  Pulse/HCG Rate: 71ECG Heart Rate: 68 Temp: 98.1 F (36.7 C) Resp: 14 BP: 114/82 SpO2: 100 %  BMI: Estimated body mass index is 36.84 kg/m as calculated from the following:   Height as of this encounter: 5\' 1"  (1.549 m).   Weight as of this encounter: 195 lb (88.5 kg).  Risk Assessment: Allergies: Reviewed. She is allergic to 5-alpha reductase inhibitors; glatiramer; rebif [interferon beta-1a]; and latex.  Allergy Precautions: None required Coagulopathies: Reviewed. None identified.  Blood-thinner therapy: None at this time Active Infection(s): Reviewed. None identified. [name] is afebrile  Site Confirmation: Shannon Benitez was asked to confirm the procedure and laterality before marking the site Procedure checklist: Completed Consent: Before the procedure and under the influence of no sedative(s), amnesic(s), or anxiolytics, the patient was informed of the treatment options, risks and possible complications. To fulfill our ethical and legal obligations, as recommended by the American Medical Association's Code of Ethics, I have informed the patient of my clinical  impression; the nature and purpose of the treatment or procedure; the risks, benefits, and possible complications of the intervention; the alternatives, including doing nothing; the risk(s) and benefit(s) of the alternative treatment(s) or procedure(s); and the risk(s) and benefit(s) of doing nothing. The  patient was provided information about the general risks and possible complications associated with the procedure. These may include, but are not limited to: failure to achieve desired goals, infection, bleeding, organ or nerve damage, allergic reactions, paralysis, and death. In addition, the patient was informed of those risks and complications associated to Spine-related procedures, such as failure to decrease pain; infection (i.e.: Meningitis, epidural or intraspinal abscess); bleeding (i.e.: epidural hematoma, subarachnoid hemorrhage, or any other type of intraspinal or peri-dural bleeding); organ or nerve damage (i.e.: Any type of peripheral nerve, nerve root, or spinal cord injury) with subsequent damage to sensory, motor, and/or autonomic systems, resulting in permanent pain, numbness, and/or weakness of one or several areas of the body; allergic reactions; (i.e.: anaphylactic reaction); and/or death. Furthermore, the patient was informed of those risks and complications associated with the medications. These include, but are not limited to: allergic reactions (i.e.: anaphylactic or anaphylactoid reaction(s)); adrenal axis suppression; blood sugar elevation that in diabetics may result in ketoacidosis or comma; water retention that in patients with history of congestive heart failure may result in shortness of breath, pulmonary edema, and decompensation with resultant heart failure; weight gain; swelling or edema; medication-induced neural toxicity; particulate matter embolism and blood vessel occlusion with resultant organ, and/or nervous system infarction; and/or aseptic necrosis of one or more  joints. Finally, the patient was informed that Medicine is not an exact science; therefore, there is also the possibility of unforeseen or unpredictable risks and/or possible complications that may result in a catastrophic outcome. The patient indicated having understood very clearly. We have given the patient no guarantees and we have made no promises. Enough time was given to the patient to ask questions, all of which were answered to the patient's satisfaction. Ms. Alberts has indicated that she wanted to continue with the procedure. Attestation: I, the ordering provider, attest that I have discussed with the patient the benefits, risks, side-effects, alternatives, likelihood of achieving goals, and potential problems during recovery for the procedure that I have provided informed consent. Date  Time: 03/03/2020  8:13 AM  Pre-Procedure Preparation:  Monitoring: As per clinic protocol. Respiration, ETCO2, SpO2, BP, heart rate and rhythm monitor placed and checked for adequate function Safety Precautions: Patient was assessed for positional comfort and pressure points before starting the procedure. Time-out: I initiated and conducted the "Time-out" before starting the procedure, as per protocol. The patient was asked to participate by confirming the accuracy of the "Time Out" information. Verification of the correct person, site, and procedure were performed and confirmed by me, the nursing staff, and the patient. "Time-out" conducted as per Joint Commission's Universal Protocol (UP.01.01.01). Time: [id number]  Description of Procedure:          Laterality: Bilateral. The procedure was performed in identical fashion on both sides. Levels:  L2, L3, L4, L5, & S1 Medial Branch Level(s) Area Prepped: Posterior Lumbosacral Region DuraPrep (Iodine Povacrylex [0.7% available iodine] and Isopropyl Alcohol, 74% w/w) Safety Precautions: Aspiration looking for blood return was conducted prior to all injections.  At no point did we inject any substances, as a needle was being advanced. Before injecting, the patient was told to immediately notify me if she was experiencing any new onset of "ringing in the ears, or metallic taste in the mouth". No attempts were made at seeking any paresthesias. Safe injection practices and needle disposal techniques used. Medications properly checked for expiration dates. SDV (single dose vial) medications used. After the completion of the procedure, all disposable equipment used  was discarded in the proper designated medical waste containers. Local Anesthesia: Protocol guidelines were followed. The patient was positioned over the fluoroscopy table. The area was prepped in the usual manner. The time-out was completed. The target area was identified using fluoroscopy. A 12-in long, straight, sterile hemostat was used with fluoroscopic guidance to locate the targets for each level blocked. Once located, the skin was marked with an approved surgical skin marker. Once all sites were marked, the skin (epidermis, dermis, and hypodermis), as well as deeper tissues (fat, connective tissue and muscle) were infiltrated with a small amount of a short-acting local anesthetic, loaded on a 10cc syringe with a 25G, 1.5-in  Needle. An appropriate amount of time was allowed for local anesthetics to take effect before proceeding to the next step. Local Anesthetic: Lidocaine 2.0% The unused portion of the local anesthetic was discarded in the proper designated containers. Technical explanation of process:  L2 Medial Branch Nerve Block (MBB): The target area for the L2 medial branch is at the junction of the postero-lateral aspect of the superior articular process and the superior, posterior, and medial edge of the transverse process of L3. Under fluoroscopic guidance, a Quincke needle was inserted until contact was made with os over the superior postero-lateral aspect of the pedicular shadow (target area).  After negative aspiration for blood, 0.5 mL of the nerve block solution was injected without difficulty or complication. The needle was removed intact. L3 Medial Branch Nerve Block (MBB): The target area for the L3 medial branch is at the junction of the postero-lateral aspect of the superior articular process and the superior, posterior, and medial edge of the transverse process of L4. Under fluoroscopic guidance, a Quincke needle was inserted until contact was made with os over the superior postero-lateral aspect of the pedicular shadow (target area). After negative aspiration for blood, 0.5 mL of the nerve block solution was injected without difficulty or complication. The needle was removed intact. L4 Medial Branch Nerve Block (MBB): The target area for the L4 medial branch is at the junction of the postero-lateral aspect of the superior articular process and the superior, posterior, and medial edge of the transverse process of L5. Under fluoroscopic guidance, a Quincke needle was inserted until contact was made with os over the superior postero-lateral aspect of the pedicular shadow (target area). After negative aspiration for blood, 0.5 mL of the nerve block solution was injected without difficulty or complication. The needle was removed intact. L5 Medial Branch Nerve Block (MBB): The target area for the L5 medial branch is at the junction of the postero-lateral aspect of the superior articular process and the superior, posterior, and medial edge of the sacral ala. Under fluoroscopic guidance, a Quincke needle was inserted until contact was made with os over the superior postero-lateral aspect of the pedicular shadow (target area). After negative aspiration for blood, 0.5 mL of the nerve block solution was injected without difficulty or complication. The needle was removed intact. S1 Medial Branch Nerve Block (MBB): The target area for the S1 medial branch is at the posterior and inferior 6 o'clock  position of the L5-S1 facet joint. Under fluoroscopic guidance, the Quincke needle inserted for the L5 MBB was redirected until contact was made with os over the inferior and postero aspect of the sacrum, at the 6 o' clock position under the L5-S1 facet joint (Target area). After negative aspiration for blood, 0.5 mL of the nerve block solution was injected without difficulty or complication. The needle was  removed intact.  Nerve block solution: 0.2% PF-Ropivacaine + Triamcinolone (40 mg/mL) diluted to a final concentration of 4 mg of Triamcinolone/mL of Ropivacaine The unused portion of the solution was discarded in the proper designated containers. Procedural Needles: 22-gauge, 3.5-inch, Quincke needles used for all levels.  Once the entire procedure was completed, the treated area was cleaned, making sure to leave some of the prepping solution back to take advantage of its long term bactericidal properties.   Illustration of the posterior view of the lumbar spine and the posterior neural structures. Laminae of L2 through S1 are labeled. DPRL5, dorsal primary ramus of L5; DPRS1, dorsal primary ramus of S1; DPR3, dorsal primary ramus of L3; FJ, facet (zygapophyseal) joint L3-L4; I, inferior articular process of L4; LB1, lateral branch of dorsal primary ramus of L1; IAB, inferior articular branches from L3 medial branch (supplies L4-L5 facet joint); IBP, intermediate branch plexus; MB3, medial branch of dorsal primary ramus of L3; NR3, third lumbar nerve root; S, superior articular process of L5; SAB, superior articular branches from L4 (supplies L4-5 facet joint also); TP3, transverse process of L3.  Vitals:   03/03/20 0912 03/03/20 0920 03/03/20 0922 03/03/20 0930  BP: 105/60 (P) 110/69 112/63 110/67  Pulse: 65     Resp: 14 (P) 17 16 17   Temp: 98 F (36.7 C)   98.1 F (36.7 C)  TempSrc: Temporal     SpO2: 95% (P) 97% 96% 96%  Weight:      Height:         Start Time: 0854 hrs. End Time:  0902 hrs.  Imaging Guidance (Spinal):          Type of Imaging Technique: Fluoroscopy Guidance (Spinal) Indication(s): Assistance in needle guidance and placement for procedures requiring needle placement in or near specific anatomical locations not easily accessible without such assistance. Exposure Time: Please see nurses notes. Contrast: None used. Fluoroscopic Guidance: I was personally present during the use of fluoroscopy. "Tunnel Vision Technique" used to obtain the best possible view of the target area. Parallax error corrected before commencing the procedure. "Direction-depth-direction" technique used to introduce the needle under continuous pulsed fluoroscopy. Once target was reached, antero-posterior, oblique, and lateral fluoroscopic projection used confirm needle placement in all planes. Images permanently stored in EMR. Interpretation: No contrast injected. I personally interpreted the imaging intraoperatively. Adequate needle placement confirmed in multiple planes. Permanent images saved into the patient's record.  Antibiotic Prophylaxis:   Anti-infectives (From admission, onward)   None     Indication(s): None identified  Post-operative Assessment:  Post-procedure Vital Signs:  Pulse/HCG Rate: 6569 Temp: 98.1 F (36.7 C) Resp: 17 BP: 110/67 SpO2: 96 %  EBL: None  Complications: No immediate post-treatment complications observed by team, or reported by patient.  Note: The patient tolerated the entire procedure well. A repeat set of vitals were taken after the procedure and the patient was kept under observation following institutional policy, for this type of procedure. Post-procedural neurological assessment was performed, showing return to baseline, prior to discharge. The patient was provided with post-procedure discharge instructions, including a section on how to identify potential problems. Should any problems arise concerning this procedure, the patient was given  instructions to immediately contact us, at any time, without hesitation. In any case, we plan to contact the patient by telephone for a follow-up status report regarding this interventional procedure.  Comments:  No additional relevant information.  Plan of Care  Orders:  Orders Placed This Encounter  Procedures  .  LUMBAR FACET(MEDIAL BRANCH NERVE BLOCK) MBNB    Scheduling Instructions:     Procedure: Lumbar facet block (AKA.: Lumbosacral medial branch nerve block)     Side: Bilateral     Level: L3-4, L4-5, & L5-S1 Facets (L2, L3, L4, L5, & S1 Medial Branch Nerves)     Sedation: Patient's choice.     Timeframe: Today    Order Specific Question:   Where will this procedure be performed?    Answer:   [facility]  . DG [facility] C-ARM 1-60 MIN NO REPORT    Intraoperative interpretation by procedural physician at [place].    Standing Status:   Standing    Number of Occurrences:   1    Order Specific Question:   Reason for exam:    Answer:   Assistance in needle guidance and placement for procedures requiring needle placement in or near specific anatomical locations not easily accessible without such assistance.  . Informed Consent Details: Physician/Practitioner Attestation; Transcribe to consent form and obtain patient signature    Nursing Order: Transcribe to consent form and obtain patient signature. Note: Always confirm laterality of pain with Ms. Gheen, before procedure. Procedure: Lumbar Facet Block  under fluoroscopic guidance Indication/Reason: Low Back Pain, with our without leg pain, due to Facet Joint Arthralgia (Joint Pain) known as Lumbar Facet Syndrome, secondary to Lumbar, and/or Lumbosacral Spondylosis (Arthritis of the Spine), without myelopathy or radiculopathy (Nerve Damage). Provider Attestation: I, [place] Dossie Arbour, MD, (Pain Management Specialist), the physician/practitioner, attest that I have discussed with the patient the benefits,  risks, side effects, alternatives, likelihood of achieving goals and potential problems during recovery for the procedure that I have provided informed consent.  . Provide equipment / supplies at bedside    Equipment required: Single use, disposable, "Block Tray"    Standing Status:   Standing    Number of Occurrences:   1    Order Specific Question:   Specify    Answer:   Block Tray   Chronic Opioid Analgesic:  Oxycodone IR 5 mg, 1 tab PO BID (10 mg/day of oxycodone)  MME/day:15mg /day.   Medications ordered for procedure: Meds ordered this encounter  Medications  . lidocaine (XYLOCAINE) 2 % (with pres) injection 400 mg  . lactated ringers infusion 1,000 mL  . midazolam (VERSED) 5 MG/5ML injection 1-2 mg    Make sure Flumazenil is available in the pyxis when using this medication. If oversedation occurs, administer 0.2 mg IV over 15 sec. If after 45 sec no response, administer 0.2 mg again over 1 min; may repeat at 1 min intervals; not to exceed 4 doses (1 mg)  . fentaNYL (SUBLIMAZE) injection 25-50 mcg    Make sure Narcan is available in the pyxis when using this medication. In the event of respiratory depression (RR< 8/min): Titrate NARCAN (naloxone) in increments of 0.1 to 0.2 mg IV at 2-3 minute intervals, until desired degree of reversal.  . ropivacaine (PF) 2 mg/mL (0.2%) (NAROPIN) injection 18 mL  . triamcinolone acetonide (KENALOG-40) injection 80 mg   Medications administered: We administered lidocaine, lactated ringers, midazolam, fentaNYL, ropivacaine (PF) 2 mg/mL (0.2%), and triamcinolone acetonide.  See the medical record for exact dosing, route, and time of administration.  Follow-up plan:   Return in about 2 weeks (around 03/17/2020) for (PP), (VV).       Interventional management options: Considering:   Continue encouraging the patient to bring her BMI below 35. Diagnostic bilateral T11-12 lumbar facet block  Diagnostic right T7-8 TESI   Diagnostic right T7 TFESI    Diagnostic ML CESI     Palliative PRN treatment(s):   Palliative bilateral lumbar facet block #4  Palliative left lumbar facet RFA #3 (last done on 10/27/2019)  Palliative right lumbar facet RFA #3 (last done on 10/08/2019)     Recent Visits Date Type Provider Dept  02/11/20 Telemedicine Milinda Pointer, MD [facility]  Showing recent visits within past 90 days and meeting all other requirements   Today's Visits Date Type Provider Dept  03/03/20 Procedure visit Milinda Pointer, MD [facility]  Showing today's visits and meeting all other requirements   Future Appointments Date Type Provider Dept  03/17/20 Appointment Milinda Pointer, [facility]  05/12/20 Appointment Milinda Pointer, MD [facility]  Showing future appointments within next 90 days and meeting all other requirements   Disposition: Discharge home  Discharge (Date  Time): 03/03/2020; 0935 hrs.   Primary Care Physician: Marygrace Drought, MD Location: [facility] Note by: [name], MD Date: 03/03/2020; Time: 10:46 AM  Disclaimer:  Medicine is not an exact science. The only guarantee in medicine is that nothing is guaranteed. It is important to note that the decision to proceed with this intervention was based on the information collected from the patient. The Data and conclusions were drawn from the patient's questionnaire, the interview, and the physical examination. Because the information was provided in large part by the patient, it cannot be guaranteed that it has not been purposely or unconsciously manipulated. Every effort has been made to obtain as much relevant data as possible for this evaluation. It is important to note that the conclusions that lead to this procedure are derived in large part from the available data. Always take into account that the treatment will also be dependent on availability of resources and  existing treatment guidelines, considered by other Pain Management Practitioners as being common knowledge and practice, at the time of the intervention. For Medico-Legal purposes, it is also important to point out that variation in procedural techniques and pharmacological choices are the acceptable norm. The indications, contraindications, technique, and results of the above procedure should only be interpreted and judged by a Board-Certified Interventional Pain Specialist with extensive familiarity and expertise in the same exact procedure and technique.

## 2020-03-04 ENCOUNTER — Telehealth: Payer: Self-pay

## 2020-03-04 NOTE — Telephone Encounter (Signed)
Post procedure phone call. Patient states she is doing good.  

## 2020-03-16 ENCOUNTER — Telehealth: Payer: Self-pay | Admitting: *Deleted

## 2020-03-16 NOTE — Telephone Encounter (Signed)
Voicemail left with patient to please call to review medicationis prior to VV on May 13.

## 2020-03-17 ENCOUNTER — Other Ambulatory Visit: Payer: Self-pay

## 2020-03-17 ENCOUNTER — Ambulatory Visit (HOSPITAL_BASED_OUTPATIENT_CLINIC_OR_DEPARTMENT_OTHER): Payer: Medicaid Other | Admitting: Pain Medicine

## 2020-03-17 DIAGNOSIS — M47816 Spondylosis without myelopathy or radiculopathy, lumbar region: Secondary | ICD-10-CM

## 2020-03-17 DIAGNOSIS — M545 Low back pain: Secondary | ICD-10-CM

## 2020-03-17 DIAGNOSIS — G8929 Other chronic pain: Secondary | ICD-10-CM

## 2020-03-17 NOTE — Patient Instructions (Signed)
____________________________________________________________________________________________  Preparing for Procedure with Sedation  Procedure appointments are limited to planned procedures: . No Prescription Refills. . No disability issues will be discussed. . No medication changes will be discussed.  Instructions: . Oral Intake: Do not eat or drink anything for at least 8 hours prior to your procedure. (Exception: Blood Pressure Medication. See below.) . Transportation: Unless otherwise stated by your physician, you may drive yourself after the procedure. . Blood Pressure Medicine: Do not forget to take your blood pressure medicine with a sip of water the morning of the procedure. If your Diastolic (lower reading)is above 100 mmHg, elective cases will be cancelled/rescheduled. . Blood thinners: These will need to be stopped for procedures. Notify our staff if you are taking any blood thinners. Depending on which one you take, there will be specific instructions on how and when to stop it. . Diabetics on insulin: Notify the staff so that you can be scheduled 1st case in the morning. If your diabetes requires high dose insulin, take only  of your normal insulin dose the morning of the procedure and notify the staff that you have done so. . Preventing infections: Shower with an antibacterial soap the morning of your procedure. . Build-up your immune system: Take 1000 mg of Vitamin C with every meal (3 times a day) the day prior to your procedure. . Antibiotics: Inform the staff if you have a condition or reason that requires you to take antibiotics before dental procedures. . Pregnancy: If you are pregnant, call and cancel the procedure. . Sickness: If you have a cold, fever, or any active infections, call and cancel the procedure. . Arrival: You must be in the facility at least 30 minutes prior to your scheduled procedure. . Children: Do not bring children with you. . Dress appropriately:  Bring dark clothing that you would not mind if they get stained. . Valuables: Do not bring any jewelry or valuables.  Reasons to call and reschedule or cancel your procedure: (Following these recommendations will minimize the risk of a serious complication.) . Surgeries: Avoid having procedures within 2 weeks of any surgery. (Avoid for 2 weeks before or after any surgery). . Flu Shots: Avoid having procedures within 2 weeks of a flu shots or . (Avoid for 2 weeks before or after immunizations). . Barium: Avoid having a procedure within 7-10 days after having had a radiological study involving the use of radiological contrast. (Myelograms, Barium swallow or enema study). . Heart attacks: Avoid any elective procedures or surgeries for the initial 6 months after a "Myocardial Infarction" (Heart Attack). . Blood thinners: It is imperative that you stop these medications before procedures. Let us know if you if you take any blood thinner.  . Infection: Avoid procedures during or within two weeks of an infection (including chest colds or gastrointestinal problems). Symptoms associated with infections include: Localized redness, fever, chills, night sweats or profuse sweating, burning sensation when voiding, cough, congestion, stuffiness, runny nose, sore throat, diarrhea, nausea, vomiting, cold or Flu symptoms, recent or current infections. It is specially important if the infection is over the area that we intend to treat. . Heart and lung problems: Symptoms that may suggest an active cardiopulmonary problem include: cough, chest pain, breathing difficulties or shortness of breath, dizziness, ankle swelling, uncontrolled high or unusually low blood pressure, and/or palpitations. If you are experiencing any of these symptoms, cancel your procedure and contact your primary care physician for an evaluation.  Remember:  Regular Business hours are:    Monday to Thursday 8:00 AM to 4:00 PM  Provider's  Schedule: Genesi Stefanko, MD:  Procedure days: Tuesday and Thursday 7:30 AM to 4:00 PM  Bilal Lateef, MD:  Procedure days: Monday and Wednesday 7:30 AM to 4:00 PM ____________________________________________________________________________________________    

## 2020-03-17 NOTE — Progress Notes (Signed)
Patient: Shannon Benitez  Service Category: E/M  Provider: [name], MD  DOB: 26-Jan-1971  DOS: 03/17/2020  Location: Office  MRN: [id number]  Setting: Ambulatory outpatient  Referring Provider: Marygrace Drought, MD  Type: Established Patient  Specialty: Interventional Pain Management  PCP: Marygrace Drought, MD  Location: Remote location  Delivery: TeleHealth     Virtual Encounter - Pain Management PROVIDER NOTE: Information contained herein reflects review and annotations entered in association with encounter. Interpretation of such information and data should be left to medically-trained personnel. Information provided to patient can be located elsewhere in the medical record under "Patient Instructions". Document created using STT-dictation technology, any transcriptional errors that may result from process are unintentional.    Contact & Pharmacy Preferred: [phone] Home: [phone] (home) Mobile: [phone] (mobile)  E-mail: [email or web address], [address] [place] [address] Phone: [phone] Fax: [phone]  [id number]: [phone]: [phone]  Pre-screening  [name] offered "in-person" vs "virtual" encounter. She indicated preferring virtual for this encounter.   Reason COVID-19*  Social distancing based on CDC and AMA recommendations.   I contacted Bryttany Welby on 03/17/2020 via telephone.      I clearly identified myself as FGaspar Cola MD. I verified that I was speaking with the correct person using two identifiers (Name: ETamirra Benitez and date of birth: 806-22-1972.  Consent I sought verbal [facility] consent [facility]. I informed [name] of possible security and privacy concerns, risks, and limitations associated with providing "not-in-person"  medical evaluation and management services. I also informed [name] of the availability of "in-person" appointments. Finally, I informed her that there would be a charge for the virtual visit and that she could be  personally, fully or partially, financially responsible for it. Ms. WBilliterexpressed understanding and agreed to proceed.   Historic Elements   Ms. ESonora Catlinis a 49y.o. year old, female patient evaluated today after her last contact with our practice on 03/16/2020. [name] has a past medical history of Acute postoperative pain (04/30/2019), Diabetes mellitus without complication ([place], GERD (gastroesophageal reflux disease), Headache, MS (multiple sclerosis) ([place], Numbness and tingling of both legs below knees, Osteoarthritis, Vertigo, and Wears dentures. She also  has a past surgical history that includes Abdominal hysterectomy; Cesarean section; Tumor removal (Left); Cataract extraction w/PHACO (Right, 03/18/2019); and Cataract extraction w/PHACO (Left, 04/13/2019). Ms. WSiebershas a current medication list which includes the following prescription(s): accu-chek softclix lancets, aspirin-acetaminophen-caffeine, baclofen, dimethyl fumarate, duloxetine, lamotrigine, levetiracetam, loperamide, meloxicam, metformin, mirabegron er, omeprazole, oxycodone, [START ON 03/19/2020] oxycodone, [START ON 04/18/2020] oxycodone, pregabalin, primidone, propranolol, and sitagliptin. She  reports that she quit smoking about 3 years ago. Her smoking use included cigarettes. She has a 9.00 pack-year smoking history. She has quit using smokeless tobacco. She reports that she does not drink alcohol. No history on file for drug. [name] allergic to 5-alpha reductase inhibitors; glatiramer; rebif [interferon beta-1a]; and latex.   HPI  Today, she is being contacted for a post-procedure assessment.  The patient indicates having attained 100% relief of the pain for approximately 2  days, followed by a slow return of her symptoms.  The fact that she had that 100% relief with the diagnostic injection would again suggest that her last radiofrequency ablation has probably worn off  and she needs to have it repeated.  She confirmed that when she has the procedure done she usually has great relief of the pain for an extended period of time.  Post-Procedure Evaluation  Procedure: Diagnostic/therapeutic bilateral lumbar facet block #4 under fluoroscopic guidance and IV sedation Pre-procedure pain level: 6/10 Post-procedure: 0/10 (100% relief)  Sedation: Sedation provided.  Effectiveness during initial hour after procedure(Ultra-Short Term Relief):   100%.  Local anesthetic used: Long-acting (4-6 hours) Effectiveness: Defined as any analgesic benefit obtained secondary to the administration of local anesthetics. This carries significant diagnostic value as to the etiological location, or anatomical origin, of the pain. Duration of benefit is expected to coincide with the duration of the local anesthetic used.  Effectiveness during initial 4-6 hours after procedure(Short-Term Relief):   100%.  Long-term benefit: Defined as any relief past the pharmacologic duration of the local anesthetics.  Effectiveness past the initial 6 hours after procedure(Long-Term Relief):   100% x 2 days.  Current benefits: Defined as benefit that persist at this time.   Analgesia:  Back to baseline Function: Back to baseline ROM: Back to baseline   Medical Necessity: [name] has experienced debilitating chronic pain from the Lumbosacral Facet Syndrome (Spondylosis without myelopathy or radiculopathy, lumbosacral region [M47.817]) that has persisted for longer than three months of failed non-surgical care and has either failed to respond, or was unable to tolerate, or simply did not get enough benefit from other more conservative therapies including, but not limited to: 1. Over-the-counter oral  analgesic medications (i.e.: ibuprofen, naproxen, etc.) 2. Anti-inflammatory medications 3. Muscle relaxants 4. Membrane stabilizers 5. Opioids 6. Physical therapy (PT), chiropractic manipulation, and/or home exercise program (HEP). 7. Modalities (Heat, ice, etc.) 8. Invasive techniques such as nerve blocks.  Ms. Proctor has attained greater than 50% reduction in pain from at least four (4) diagnostic medial branch blocks conducted in separate occasions.  In addition, the patient has already had 2 prior radiofrequency ablations with excellent benefit.  Unfortunately the last set of lumbar facet radiofrequency's wore off and she needs to have it repeated.  For this reason, I believe it is medically necessary to proceed with Non-Pulsed Radiofrequency Ablation for the purpose of attempting to prolong the duration of the benefits seen with the diagnostic injections.  Pharmacotherapy Assessment  Analgesic: Oxycodone IR 5 mg, 1 tab PO BID (10 mg/day of oxycodone)  MME/day:22m/day.   Monitoring: [place] PMP: PDMP reviewed during this encounter.       Pharmacotherapy: No side-effects or adverse reactions reported. Compliance: No problems identified. Effectiveness: Clinically acceptable. Plan: Refer to "POC".  UDS:  Summary  Date Value Ref Range Status  11/27/2018 FINAL  Final    Comment:    ==================================================================== TOXAS[facility]RE COMP DRUG ANALYSIS,UR ==================================================================== Test                             Result       Flag       Units Drug Present and Declared for Prescription Verification   7-aminoclonazepam              319          EXPECTED   ng/mg creat    7-aminoclonazepam is an expected metabolite of clonazepam. Source    of clonazepam is a scheduled prescription medication.   Primidone                      PRESENT  EXPECTED   Phenobarbital                  PRESENT      EXPECTED     Phenobarbital is an expected metabolite of primidone;    Phenobarbital may also be administered as a prescription drug.   Gabapentin                     PRESENT      EXPECTED   Lamotrigine                    PRESENT      EXPECTED   Baclofen                       PRESENT      EXPECTED   Duloxetine                     PRESENT      EXPECTED   Propranolol                    PRESENT      EXPECTED Drug Present not Declared for Prescription Verification   Metaxalone                     PRESENT      UNEXPECTED   Acetaminophen                  PRESENT      UNEXPECTED   Diphenhydramine                PRESENT      UNEXPECTED ==================================================================== Test                      Result    Flag   Units      Ref Range   Creatinine              47               mg/dL      >=20 ==================================================================== Declared Medications:  The flagging and interpretation on this report are based on the  following declared medications.  Unexpected results may arise from  inaccuracies in the declared medications.  **Note: The testing scope of this panel includes these medications:  Baclofen (Lioresal)  Clonazepam (Klonopin)  Duloxetine (Cymbalta)  Gabapentin (Neurontin)  Lamotrigine (Lamictal)  Primidone (Mysoline)  Propranolol (Inderal)  **Note: The testing scope of this panel does not include following  reported medications:  Dimethyl Fumarate  Metformin (Glucophage)  Mirabegron (Myrbetriq)  Omeprazole (Prilosec)  Sitagliptin (Januvia) ==================================================================== For clinical consultation, please call [phone]. ====================================================================    Laboratory Chemistry Profile   Renal Lab Results  Component Value Date   BUN 13 09/25/2019   CREATININE 0.53 09/25/2019   BCR 13 11/27/2018   GFRAA >60 09/25/2019   GFRNONAA >60 09/25/2019      Hepatic Lab Results  Component Value Date   AST 17 11/27/2018   ALT 35 08/18/2018   ALBUMIN 4.1 11/27/2018   ALKPHOS 91 11/27/2018     Electrolytes Lab Results  Component Value Date   NA 136 09/25/2019   K 3.8 09/25/2019   CL 101 09/25/2019   CALCIUM 8.8 (L) 09/25/2019   MG 2.2 11/27/2018     Bone Lab Results  Component Value Date   25OHVITD1 55 11/27/2018   25OHVITD2 <1.0 11/27/2018  25OHVITD3 55 11/27/2018     Inflammation (CRP: Acute Phase) (ESR: Chronic Phase) Lab Results  Component Value Date   CRP 10 11/27/2018   ESRSEDRATE 42 (H) 11/27/2018       Note: Above Lab results reviewed.  Imaging  DG PAIN CLINIC C-ARM 1-60 MIN NO REPORT Fluoro was used, but no Radiologist interpretation will be provided.  Please refer to "NOTES" tab for provider progress note.  Assessment  The primary encounter diagnosis was Lumbar facet syndrome (Bilateral) (R>L). 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  Plan of Care  Problem-specific:  No problem-specific Assessment & Plan notes found for this encounter.  Ms. Woodrow Dulski has a current medication list which includes the following long-term medication(s): baclofen, duloxetine, lamotrigine, levetiracetam, meloxicam, metformin, omeprazole, oxycodone, [START ON 03/19/2020] oxycodone, [START ON 04/18/2020] oxycodone, pregabalin, primidone, propranolol, and sitagliptin.  Pharmacotherapy (Medications Ordered): No orders of the defined types were placed in this encounter.  Orders:  Orders Placed This Encounter  Procedures  . Radiofrequency,Lumbar    Standing Status:   Future    Standing Expiration Date:   09/17/2021    Scheduling Instructions:     Side(s): Right-sided     Level: L3-4, L4-5, & L5-S1 Facets (L2, L3, L4, L5, & S1 Medial Branch Nerves)     Sedation: With Sedation     Scheduling Timeframe: As  soon as pre-approved    Order Specific Question:   Where will this procedure be performed?    Answer:   [facility]   Follow-up plan:   Return for RFA (w/ sedation): (R) L-FCT RFA #3.      Interventional management options: Considering:   Continue encouraging the patient to bring her BMI below 35. Diagnostic bilateral T11-12 lumbar facet block   Diagnostic right T7-8 TESI   Diagnostic right T7 TFESI   Diagnostic ML CESI     Palliative PRN treatment(s):   Palliative bilateral lumbar facet block #4  Palliative left lumbar facet RFA #3 (last done on 10/27/2019)  Palliative right lumbar facet RFA #3 (last done on 10/08/2019)     Recent Visits Date Type Provider Dept  03/03/20 Procedure visit Milinda Pointer, MD [facility]  02/11/20 Telemedicine Milinda Pointer, MD [facility]  Showing recent visits within past 90 days and meeting all other requirements   Future Appointments Date Type Provider Dept  05/12/20 Appointment Milinda Pointer, MD [facility]  Showing future appointments within next 90 days and meeting all other requirements   I discussed the assessment and treatment plan with the patient. The patient was provided an opportunity to ask questions and all were answered. The patient agreed with the plan and demonstrated an understanding of the instructions.  Patient advised to call back or seek an in-person evaluation if the symptoms or condition worsens.  Duration of encounter: 15 minutes.  Note by: [name], MD Date: 03/17/2020; Time: 2:58 PM

## 2020-04-01 ENCOUNTER — Emergency Department
Admission: EM | Admit: 2020-04-01 | Discharge: 2020-04-01 | Disposition: A | Discharge status: Home / Self Care | Payer: Medicaid Other | Attending: Emergency Medicine | Admitting: Emergency Medicine

## 2020-04-01 ENCOUNTER — Encounter: Payer: Self-pay | Admitting: Emergency Medicine

## 2020-04-01 ENCOUNTER — Emergency Department: Payer: Medicaid Other

## 2020-04-01 ENCOUNTER — Other Ambulatory Visit: Payer: Self-pay

## 2020-04-01 DIAGNOSIS — Y999 Unspecified external cause status: Secondary | ICD-10-CM | POA: Diagnosis not present

## 2020-04-01 DIAGNOSIS — Y939 Activity, unspecified: Secondary | ICD-10-CM | POA: Insufficient documentation

## 2020-04-01 DIAGNOSIS — Z79899 Other long term (current) drug therapy: Secondary | ICD-10-CM | POA: Diagnosis not present

## 2020-04-01 DIAGNOSIS — S8012XA Contusion of left lower leg, initial encounter: Secondary | ICD-10-CM | POA: Diagnosis not present

## 2020-04-01 DIAGNOSIS — E119 Type 2 diabetes mellitus without complications: Secondary | ICD-10-CM | POA: Diagnosis not present

## 2020-04-01 DIAGNOSIS — W19XXXA Unspecified fall, initial encounter: Secondary | ICD-10-CM

## 2020-04-01 DIAGNOSIS — S0101XA Laceration without foreign body of scalp, initial encounter: Secondary | ICD-10-CM | POA: Insufficient documentation

## 2020-04-01 DIAGNOSIS — E1140 Type 2 diabetes mellitus with diabetic neuropathy, unspecified: Secondary | ICD-10-CM | POA: Insufficient documentation

## 2020-04-01 DIAGNOSIS — W109XXA Fall (on) (from) unspecified stairs and steps, initial encounter: Secondary | ICD-10-CM | POA: Insufficient documentation

## 2020-04-01 DIAGNOSIS — Z23 Encounter for immunization: Secondary | ICD-10-CM | POA: Diagnosis not present

## 2020-04-01 DIAGNOSIS — G35 Multiple sclerosis: Secondary | ICD-10-CM | POA: Insufficient documentation

## 2020-04-01 DIAGNOSIS — Y92240 Courthouse as the place of occurrence of the external cause: Secondary | ICD-10-CM | POA: Insufficient documentation

## 2020-04-01 DIAGNOSIS — S60222A Contusion of left hand, initial encounter: Secondary | ICD-10-CM

## 2020-04-01 DIAGNOSIS — Z7984 Long term (current) use of oral hypoglycemic drugs: Secondary | ICD-10-CM | POA: Insufficient documentation

## 2020-04-01 DIAGNOSIS — S40012A Contusion of left shoulder, initial encounter: Secondary | ICD-10-CM | POA: Insufficient documentation

## 2020-04-01 MED ORDER — TETANUS-DIPHTH-ACELL PERTUSSIS 5-2.5-18.5 LF-MCG/0.5 IM SUSP
0.5000 mL | Freq: Once | INTRAMUSCULAR | Status: AC
  Administered 2020-04-01: 0.5 mL via INTRAMUSCULAR
  Filled 2020-04-01: qty 0.5

## 2020-04-01 NOTE — Discharge Instructions (Signed)
Follow-up with your primary care provider if any continued problems.  You have multiple contusions and should apply ice to these areas.  Tomorrow they look worse than they do today and most likely will turn purple and continue to be sore.  You may wash your hair to remove the blood.  Be sure and get your hair completely dry.  There is 1 staple in your scalp that should be removed in approximately 7 days.  This can be done at your primary care provider, urgent care or return to the emergency department.  Continue taking your regular medication.  Also you may want to let your primary care provider know that you had a tetanus vaccine while in the ED.

## 2020-04-01 NOTE — ED Notes (Signed)
Pt verbalizes understanding of d/c instructions. Denies questions or concerns at this time

## 2020-04-01 NOTE — ED Triage Notes (Signed)
Pt fell down 5 stairs. Swelling/bruising to left leg.  C/o left shoulder pain from fall with increased pain with movement.  No blood thinners. Did hit head, no LOC. No blood thinners.  Hair matted, either small laceration or puncture wound to head; no active bleeding.

## 2020-04-01 NOTE — ED Notes (Signed)
See triage note  Presents s/p fall  States she [place]  . Pt has pain to left shoulder with abrasion and bruising  Also swelling and bruising noted to left lower ext  Also hit her head  Small laceration to head

## 2020-04-01 NOTE — ED Triage Notes (Signed)
[place]. Pt lost balance and fell and has left shoulder pain and left leg pain. Pt with hematoma to leg and head. No LOC. Not on blood thinners.

## 2020-04-01 NOTE — ED Provider Notes (Signed)
[facility] Emergency Department Provider Note  ____________________________________________   First MD Initiated Contact with Patient 04/01/20 1129     (approximate)  I have reviewed the triage vital signs and the nursing notes.   HISTORY  Chief Complaint Fall   HPI Shannon Benitez is a 49 y.o. female presents to the ED with multiple complaints [place].  Patient states that she did hit her head but denies any LOC.  Patient has some blood in her hair but no active bleeding.  She denies any visual changes, nausea or vomiting.  Patient denies being on blood thinners.  She also has pain to her left shoulder with abrasions and left lower extremity.  Patient states that she lost her balance which caused her to fall.  She is unaware of her last tetanus update.  She rates her pain as a 5 out of 10.       Past Medical History:  Diagnosis Date   Acute postoperative pain 04/30/2019   Diabetes mellitus without complication (HCC)    GERD (gastroesophageal reflux disease)    Headache    daily   MS (multiple sclerosis) (HCC)    Numbness and tingling of both legs below knees    pt reports secondary to MS   Osteoarthritis    lumbar   Vertigo    last episode opver 18 yrs ago   Wears dentures    full upper and lower    Patient Active Problem List   Diagnosis Date Noted   Chronic diarrhea 11/17/2019   Iron deficiency anemia 11/17/2019   Overactive bladder 11/17/2019   Vitamin B 12 deficiency 11/17/2019   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) 08/17/2019   Abnormal MRI, cervical spine (07/22/2019) 08/17/2019   Diabetic peripheral neuropathy ([place]) 02/23/2019   Morbid obesity with BMI of 40.0-44.9, adult ([place]) 01/19/2019   Spondylosis without myelopathy or radiculopathy, lumbosacral region 01/01/2019   Lumbar facet syndrome (Bilateral) (R>L) 01/01/2019   Abnormal MRI, lumbar spine  (07/02/2017) 01/01/2019   Lumbar facet hypertrophy (Multilevel) (Bilateral) 01/01/2019   Osteoarthritis of facet joint of lumbar spine 01/01/2019   Osteoarthritis of lumbar spine 01/01/2019   DDD (degenerative disc disease), lumbosacral 01/01/2019   Lumbar spondylosis 01/01/2019   Chronic low back pain (Bilateral) (L>R) w/o sciatica 12/17/2018   Muscle spasticity 12/17/2018   Chronic pain syndrome 11/27/2018   Chronic low back pain (Primary area of Pain) (Bilateral) (L>R) w/ sciatica (Bilateral) 11/27/2018   Chronic lower extremity pain (Secondary Area of Pain) (Bilateral) (R>L) 11/27/2018   Long term prescription benzodiazepine use 11/27/2018   Pharmacologic therapy 11/27/2018   Disorder of skeletal system 11/27/2018   Problems influencing health status 11/27/2018   Anemia 05/19/2018   Diabetes mellitus type 2 in obese ([place]) 02/19/2018   Anxiety 01/14/2018   Situational depression 01/14/2018   Neurogenic pain 01/14/2018   Vitamin D deficiency 01/14/2018   Medication management 06/10/2017   Sacroiliac pain 01/30/2017   Generalized seizure disorder ([place]) 11/23/2016   Multiple sclerosis ([place]) 11/23/2016   Tremor 11/23/2016    Past Surgical History:  Procedure Laterality Date   ABDOMINAL HYSTERECTOMY     CATARACT EXTRACTION W/PHACO Right 03/18/2019   Procedure: CATARACT EXTRACTION PHACO AND INTRAOCULAR LENS PLACEMENT ([place]) RIGHT;  Surgeon: Eulogio Bear, MD;  Location: [place];  Service: Ophthalmology;  Laterality: Right;  Diabetic - oral meds Latex sensitivity   CATARACT EXTRACTION W/PHACO Left 04/13/2019   Procedure:  CATARACT EXTRACTION PHACO AND INTRAOCULAR LENS PLACEMENT ([place])  LEFT;  Surgeon: Eulogio Bear, MD;  Location: [place];  Service: Ophthalmology;  Laterality: Left;  Diabetic - oral meds   CESAREAN SECTION     TUMOR REMOVAL Left    pt was 49 years old    Prior to Admission medications   Medication Sig  Start Date End Date Taking? Authorizing Provider  Accu-Chek Softclix Lancets lancets 1 each by Other route 2 (two) times a day. 04/03/19 04/02/20  [provider]  aspirin-acetaminophen-caffeine (EXCEDRIN MIGRAINE) [phone] MG tablet Take by mouth every 6 (six) hours as needed for headache.    [provider]  baclofen (LIORESAL) 20 MG tablet Take 1 tablet (20 mg total) by mouth 4 (four) times daily. 02/18/20 08/16/20  Milinda Pointer, MD  Dimethyl Fumarate (TECFIDERA) 240 MG CPDR Take 240 mg by mouth 2 (two) times daily.  08/06/19   [provider]  DULoxetine (CYMBALTA) 60 MG capsule Take 60 mg by mouth daily.    [provider]  lamoTRIgine (LAMICTAL) 25 MG tablet Take 50 mg by mouth 2 (two) times daily. 11/23/16   [provider]  levETIRAcetam (KEPPRA) 250 MG tablet Take by mouth. 02/16/20 02/15/21  [provider]  loperamide (IMODIUM A-D) 2 MG tablet Take 2 mg by mouth as needed for diarrhea or loose stools.    [provider]  meloxicam (MOBIC) 15 MG tablet Take 1 tablet (15 mg total) by mouth daily. 02/18/20 08/16/20  Milinda Pointer, MD  metFORMIN (GLUCOPHAGE-XR) 500 MG 24 hr tablet Take 250 mg by mouth daily with breakfast.  04/03/19 04/02/20  [provider]  mirabegron ER (MYRBETRIQ) 50 MG TB24 tablet Take 50 mg by mouth daily.  02/13/19   [provider]  omeprazole (PRILOSEC) 20 MG capsule Take 20 mg by mouth daily. 11/23/16 02/10/20  [provider]  oxyCODONE (OXY IR/ROXICODONE) 5 MG immediate release tablet Take 1 tablet (5 mg total) by mouth 2 (two) times daily as needed for severe pain. Must last 30 days. 02/18/20 03/19/20  Milinda Pointer, MD  oxyCODONE (OXY IR/ROXICODONE) 5 MG immediate release tablet Take 1 tablet (5 mg total) by mouth 2 (two) times daily as needed for severe pain. Must last 30 days. 03/19/20 04/18/20  Milinda Pointer, MD  oxyCODONE (OXY IR/ROXICODONE) 5 MG immediate release  tablet Take 1 tablet (5 mg total) by mouth 2 (two) times daily as needed for severe pain. Must last 30 days. 04/18/20 05/18/20  Milinda Pointer, MD  pregabalin (LYRICA) 150 MG capsule Take 1 capsule (150 mg total) by mouth 3 (three) times daily. 02/18/20 08/16/20  Milinda Pointer, MD  primidone (MYSOLINE) 50 MG tablet Take 50 mg by mouth daily. 03/06/19 03/05/20  [provider]  propranolol (INDERAL) 40 MG tablet Take 40 mg by mouth 3 (three) times daily. 12/05/17   [provider]  sitaGLIPtin (JANUVIA) 100 MG tablet Take 100 mg by mouth daily.  04/03/19 04/02/20  [provider]    Allergies 5-alpha reductase inhibitors, Glatiramer, Rebif [interferon beta-1a], and Latex  Family History  Adopted: Yes    Social History Social History   Tobacco Use   Smoking status: Former Smoker    Packs/day: 0.50    Years: 18.00    Pack years: 9.00    Types: Cigarettes    Quit date: 12/19/2016    Years since quitting: 3.2   Smokeless tobacco: Former Systems developer  Substance Use Topics   Alcohol use: Never  Drug use: Not on file    Review of Systems Constitutional: No fever/chills Eyes: No visual changes. ENT: No trauma. Cardiovascular: Denies chest pain. Respiratory: Denies shortness of breath. Gastrointestinal: No abdominal pain.  No nausea, no vomiting.  No diarrhea.  Musculoskeletal: Pain left shoulder and left lower extremity.  Cervical pain. Skin: Questionable laceration scalp. Neurological: Negative for headaches, focal weakness or numbness.  ____________________________________________   PHYSICAL EXAM:  VITAL SIGNS: ED Triage Vitals [04/01/20 1115]  Enc Vitals Group     BP 105/68     Pulse Rate 73     Resp 18     Temp 98.3 F (36.8 C)     Temp Source Oral     SpO2 94 %     Weight 200 lb (90.7 kg)     Height 5\' 1"  (1.549 m)     Head Circumference      Peak Flow      Pain Score 5     Pain Loc      Pain Edu?      Excl. in [place]?      Constitutional: Alert and oriented. Well appearing and in no acute distress. Eyes: Conjunctivae are normal. PERRL. EOMI. Head: Atraumatic. Nose: No trauma. Neck: No stridor.  No cervical tenderness on palpation posteriorly. Cardiovascular: Normal rate, regular rhythm. Grossly normal heart sounds.  Good peripheral circulation. Respiratory: Normal respiratory effort.  No retractions. Lungs CTAB. Gastrointestinal: Soft and nontender. No distention.  Musculoskeletal: Examination of the left shoulder there is soft tissue edema along with ecchymosis noted on the anterior aspect.  Range of motion is restricted secondary to discomfort.  No gross deformity was noted.  No point tenderness or step-offs are appreciated on palpation of the thoracic and lumbar spine.  On examination of the left leg there is soft tissue edema and discoloration noted over the mid anterior tib-fib.  No effusion noted on palpation of the patella.  Nontender pelvis on compression.  Also left hand third digit there is soft tissue edema with some early ecchymosis at the base.  Patient is able to flex and extend but is guarded and slow with movement. Neurologic:  Normal speech and language. No gross focal neurologic deficits are appreciated. No gait instability. Skin:  Skin is warm, dry.  On inspection of the scalp there is a 1.5 cm superficial laceration to the left posterior lateral scalp without active bleeding or foreign body noted. Psychiatric: Mood and affect are normal. Speech and behavior are normal.  ____________________________________________   LABS (all labs ordered are listed, but only abnormal results are displayed)  Labs Reviewed - No data to display  RADIOLOGY   Official radiology report(s): DG Tibia/Fibula Left  Result Date: 04/01/2020 CLINICAL DATA:  Left lower leg pain after falling down steps today. EXAM: LEFT TIBIA AND FIBULA - 2 VIEW COMPARISON:  None. FINDINGS: There is no fracture or dislocation. No  appreciable ankle or knee effusions. Slight degenerative changes of the patella. Small osteo chondroma of the distal tibia which chronically the distorts the medial cortex of the distal fibular shaft. This is not felt to be of any significance. IMPRESSION: No acute abnormality. Electronically Signed   By: Lorriane Shire M.D.   On: 04/01/2020 13:05   CT Head Wo Contrast  Result Date: 04/01/2020 CLINICAL DATA:  Head laceration and neck pain after falling down steps. Multiple sclerosis. EXAM: CT HEAD WITHOUT CONTRAST CT CERVICAL SPINE WITHOUT CONTRAST TECHNIQUE: Multidetector CT imaging of the head and cervical spine was  performed following the standard protocol without intravenous contrast. Multiplanar CT image reconstructions of the cervical spine were also generated. COMPARISON:  Brain and cervical spine MR dated 01/10/2017. FINDINGS: CT HEAD FINDINGS Brain: Minimally enlarged ventricles and subarachnoid spaces. Mild patchy white matter low density in both cerebral hemispheres, corresponding to small plaques on the previous MR No intracranial hemorrhage, mass lesion or CT evidence of acute infarction. Vascular: No hyperdense vessel or unexpected calcification. Skull: Normal. Negative for fracture or focal lesion. Sinuses/Orbits: Status post bilateral cataract extraction. Unremarkable bones and included paranasal sinuses. Other: Left posterolateral laceration and small scalp hematoma. CT CERVICAL SPINE FINDINGS Alignment: Straightening of the normal cervical lordosis. No subluxations. Skull base and vertebrae: No acute fracture. No primary bone lesion or focal pathologic process. Soft tissues and spinal canal: No prevertebral fluid or swelling. No visible canal hematoma. Disc levels:  Normal. Upper chest: Clear lung apices. Other: None. IMPRESSION: 1. Left posterolateral laceration and small scalp hematoma without skull fracture or intracranial hemorrhage. 2. No cervical spine fracture or subluxation. 3.  Straightening of the normal cervical lordosis. 4. Minimal diffuse cerebral and cerebellar atrophy. 5. Mild chronic small vessel hypodensities in both cerebral hemispheres, corresponding to small MS plaques on the previous MR. Electronically Signed   By: Claudie Revering M.D.   On: 04/01/2020 12:50   CT Cervical Spine Wo Contrast  Result Date: 04/01/2020 CLINICAL DATA:  Head laceration and neck pain after falling down steps. Multiple sclerosis. EXAM: CT HEAD WITHOUT CONTRAST CT CERVICAL SPINE WITHOUT CONTRAST TECHNIQUE: Multidetector CT imaging of the head and cervical spine was performed following the standard protocol without intravenous contrast. Multiplanar CT image reconstructions of the cervical spine were also generated. COMPARISON:  Brain and cervical spine MR dated 01/10/2017. FINDINGS: CT HEAD FINDINGS Brain: Minimally enlarged ventricles and subarachnoid spaces. Mild patchy white matter low density in both cerebral hemispheres, corresponding to small plaques on the previous MR No intracranial hemorrhage, mass lesion or CT evidence of acute infarction. Vascular: No hyperdense vessel or unexpected calcification. Skull: Normal. Negative for fracture or focal lesion. Sinuses/Orbits: Status post bilateral cataract extraction. Unremarkable bones and included paranasal sinuses. Other: Left posterolateral laceration and small scalp hematoma. CT CERVICAL SPINE FINDINGS Alignment: Straightening of the normal cervical lordosis. No subluxations. Skull base and vertebrae: No acute fracture. No primary bone lesion or focal pathologic process. Soft tissues and spinal canal: No prevertebral fluid or swelling. No visible canal hematoma. Disc levels:  Normal. Upper chest: Clear lung apices. Other: None. IMPRESSION: 1. Left posterolateral laceration and small scalp hematoma without skull fracture or intracranial hemorrhage. 2. No cervical spine fracture or subluxation. 3. Straightening of the normal cervical lordosis. 4.  Minimal diffuse cerebral and cerebellar atrophy. 5. Mild chronic small vessel hypodensities in both cerebral hemispheres, corresponding to small MS plaques on the previous MR. Electronically Signed   By: Claudie Revering M.D.   On: 04/01/2020 12:50   DG Shoulder Left  Result Date: 04/01/2020 CLINICAL DATA:  Left shoulder pain after a fall down 5 steps today. EXAM: LEFT SHOULDER - 2+ VIEW COMPARISON:  None. FINDINGS: There is no evidence of fracture or dislocation. There is no evidence of arthropathy or other focal bone abnormality. Soft tissues are unremarkable. IMPRESSION: Negative. Electronically Signed   By: Lorriane Shire M.D.   On: 04/01/2020 13:03   DG Hand Complete Left  Result Date: 04/01/2020 CLINICAL DATA:  [place] downstairs today.  Left hand pain. EXAM: LEFT HAND - COMPLETE 3+ VIEW COMPARISON:  None. FINDINGS:  The joint spaces are maintained.  No acute fracture is identified. IMPRESSION: No acute bony findings. Electronically Signed   By: Marijo Sanes M.D.   On: 04/01/2020 15:05    ____________________________________________   PROCEDURES  Procedure(s) performed (including Critical Care):  Procedures   Skin laceration on the left lateral posterior scalp measuring 1.5 cm.  No active bleeding and no foreign body noted.  Area was cleaned with normal saline.  1 staple was applied by provider without any difficulties and patient tolerated well.  ____________________________________________   INITIAL IMPRESSION / ASSESSMENT AND PLAN / ED COURSE  As part of my medical decision making, I reviewed the following data within the electronic MEDICAL RECORD NUMBER Notes from prior ED visits and [place] Controlled Substance Database  49 year old female presents to the ED with multiple complaints after [place].  Patient states she has MS and occasionally has loss of balance.  She reports she did hit her head but without LOC.  An abrasion to her left lower leg and left shoulder  also occurred.  Patient has some swelling and discoloration noted to the left third digit.  There is also a laceration to her scalp.  CT of her head and neck were negative for any acute changes.  Left shoulder, left tib-fib and left hand were all negative for acute bony injury.  Laceration was cleaned with normal saline and patient tolerated having 1 staple applied to this area without any difficulties.  Patient was given a Tdap as she was sure that it had been many years since she had her last booster.  Patient was instructed to watch the abrasions for any signs of infection and to clean daily with mild soap and water.  Patient is to follow-up with her PCP if any continued problems and return to the emergency department if any severe worsening of her symptoms.  Patient has a prescription for oxycodone 5 mg that was filled on May 15. ____________________________________________   FINAL CLINICAL IMPRESSION(S) / ED DIAGNOSES  Final diagnoses:  Laceration of scalp, initial encounter  Contusion of left shoulder, initial encounter  Contusion of left leg, initial encounter  Contusion of left hand, initial encounter  Fall, initial encounter     ED Discharge Orders    None       Note:  This document was prepared using Dragon voice recognition software and may include unintentional dictation errors.    Johnn Hai, PA-C 04/01/20 1719    Blake Divine, MD 04/02/20 [phone]

## 2020-04-07 ENCOUNTER — Ambulatory Visit
Admission: RE | Admit: 2020-04-07 | Discharge: 2020-04-07 | Disposition: A | Discharge status: Home / Self Care | Payer: Medicaid Other | Source: Ambulatory Visit | Attending: Pain Medicine | Admitting: Pain Medicine

## 2020-04-07 ENCOUNTER — Encounter: Payer: Self-pay | Admitting: Pain Medicine

## 2020-04-07 ENCOUNTER — Other Ambulatory Visit: Payer: Self-pay

## 2020-04-07 ENCOUNTER — Ambulatory Visit (HOSPITAL_BASED_OUTPATIENT_CLINIC_OR_DEPARTMENT_OTHER): Payer: Medicaid Other | Admitting: Pain Medicine

## 2020-04-07 VITALS — BP 101/76 | HR 71 | Temp 97.2°F | Resp 15 | Ht 61.0 in | Wt 204.0 lb

## 2020-04-07 DIAGNOSIS — M47816 Spondylosis without myelopathy or radiculopathy, lumbar region: Secondary | ICD-10-CM | POA: Insufficient documentation

## 2020-04-07 DIAGNOSIS — G8929 Other chronic pain: Secondary | ICD-10-CM

## 2020-04-07 DIAGNOSIS — M545 Low back pain: Secondary | ICD-10-CM

## 2020-04-07 DIAGNOSIS — M47817 Spondylosis without myelopathy or radiculopathy, lumbosacral region: Secondary | ICD-10-CM

## 2020-04-07 DIAGNOSIS — G8918 Other acute postprocedural pain: Secondary | ICD-10-CM

## 2020-04-07 DIAGNOSIS — M5137 Other intervertebral disc degeneration, lumbosacral region: Secondary | ICD-10-CM

## 2020-04-07 MED ORDER — TRIAMCINOLONE ACETONIDE 40 MG/ML IJ SUSP
40.0000 mg | Freq: Once | INTRAMUSCULAR | Status: AC
  Administered 2020-04-07: 40 mg
  Filled 2020-04-07: qty 1

## 2020-04-07 MED ORDER — HYDROCODONE-ACETAMINOPHEN 5-325 MG PO TABS: 1.0000 | ORAL_TABLET | Freq: Four times a day (QID) | ORAL | 0 refills | Status: DC | PRN

## 2020-04-07 MED ORDER — FENTANYL CITRATE (PF) 100 MCG/2ML IJ SOLN
25.0000 ug | INTRAMUSCULAR | Status: DC | PRN
  Administered 2020-04-07: 50 ug via INTRAVENOUS
  Filled 2020-04-07: qty 2

## 2020-04-07 MED ORDER — HYDROCODONE-ACETAMINOPHEN 5-325 MG PO TABS: 1.0000 | ORAL_TABLET | Freq: Four times a day (QID) | ORAL | 0 refills | Status: AC | PRN

## 2020-04-07 MED ORDER — MIDAZOLAM HCL 5 MG/5ML IJ SOLN
1.0000 mg | INTRAMUSCULAR | Status: DC | PRN
  Administered 2020-04-07: 1 mg via INTRAVENOUS
  Administered 2020-04-07: 2 mg via INTRAVENOUS
  Filled 2020-04-07: qty 5

## 2020-04-07 MED ORDER — LIDOCAINE HCL 2 % IJ SOLN
20.0000 mL | Freq: Once | INTRAMUSCULAR | Status: AC
  Administered 2020-04-07: 400 mg
  Filled 2020-04-07: qty 20

## 2020-04-07 MED ORDER — LACTATED RINGERS IV SOLN
1000.0000 mL | Freq: Once | INTRAVENOUS | Status: AC
  Administered 2020-04-07: 1000 mL via INTRAVENOUS

## 2020-04-07 MED ORDER — ROPIVACAINE HCL 2 MG/ML IJ SOLN
9.0000 mL | Freq: Once | INTRAMUSCULAR | Status: AC
  Administered 2020-04-07: 9 mL via PERINEURAL
  Filled 2020-04-07: qty 10

## 2020-04-07 NOTE — Patient Instructions (Addendum)
___________________________________________________________________________________________  Post-Radiofrequency (RF) Discharge Instructions  You have just completed a Radiofrequency Neurotomy.  The following instructions will provide you with information and guidelines for self-care upon discharge.  If at any time you have questions or concerns please call your physician. DO NOT DRIVE YOURSELF!!  Instructions:  Apply ice: Fill a plastic sandwich bag with crushed ice. Cover it with a small towel and apply to injection site. Apply for 15 minutes then remove x 15 minutes. Repeat sequence on day of procedure, until you go to bed. The purpose is to minimize swelling and discomfort after procedure.  Apply heat: Apply heat to procedure site starting the day following the procedure. The purpose is to treat any soreness and discomfort from the procedure.  Food intake: No eating limitations, unless stipulated above.  Nevertheless, if you have had sedation, you may experience some nausea.  In this case, it may be wise to wait at least two hours prior to resuming regular diet.  Physical activities: Keep activities to a minimum for the first 8 hours after the procedure. For the first 24 hours after the procedure, do not drive a motor vehicle,  Operate heavy machinery, power tools, or handle any weapons.  Consider walking with the use of an assistive device or accompanied by an adult for the first 24 hours.  Do not drink alcoholic beverages including beer.  Do not make any important decisions or sign any legal documents. Go home and rest today.  Resume activities tomorrow, as tolerated.  Use caution in moving about as you may experience mild leg weakness.  Use caution in cooking, use of household electrical appliances and climbing steps.  Driving: If you have received any sedation, you are not allowed to drive for 24 hours after your procedure.  Blood thinner: Restart your blood thinner 6 hours after your  procedure. (Only for those taking blood thinners)  Insulin: As soon as you can eat, you may resume your normal dosing schedule. (Only for those taking insulin)  Medications: May resume pre-procedure medications.  Do not take any drugs, other than what has been prescribed to you.  Infection prevention: Keep procedure site clean and dry.  Post-procedure Pain Diary: Extremely important that this be done correctly and accurately. Recorded information will be used to determine the next step in treatment.  Pain evaluated is that of treated area only. Do not include pain from an untreated area.  Complete every hour, on the hour, for the initial 8 hours. Set an alarm to help you do this part accurately.  Do not go to sleep and have it completed later. It will not be accurate.  Follow-up appointment: Keep your follow-up appointment after the procedure. Usually 2-6 weeks after radiofrequency. Bring you pain diary. The information collected will be essential for your long-term care.   Expect:  From numbing medicine (AKA: Local Anesthetics): Numbness or decrease in pain.  Onset: Full effect within 15 minutes of injected.  Duration: It will depend on the type of local anesthetic used. On the average, 1 to 8 hours.   From steroids (when added): Decrease in swelling or inflammation. Once inflammation is improved, relief of the pain will follow.  Onset of benefits: Depends on the amount of swelling present. The more swelling, the longer it will take for the benefits to be seen. In some cases, up to 10 days.  Duration: Steroids will stay in the system x 2 weeks. Duration of benefits will depend on multiple posibilities including persistent irritating factors.    From procedure: Some discomfort is to be expected once the numbing medicine wears off. In the case of radiofrequency procedures, this may last as long as 6 weeks. Additional post-procedure pain medication is provided for this. Discomfort is  minimized if ice and heat are applied as instructed.  Call if:  You experience numbness and weakness that gets worse with time, as opposed to wearing off.  He experience any unusual bleeding, difficulty breathing, or loss of the ability to control your bowel and bladder. (This applies to Spinal procedures only)  You experience any redness, swelling, heat, red streaks, elevated temperature, fever, or any other signs of a possible infection.  Emergency Numbers:  [facility] business hours (Monday - Thursday, 8:00 AM - 4:00 PM) (Friday, 9:00 AM - 12:00 Noon): [phone]  After hours: [phone] ____________________________________________________________________________________________   [id number]

## 2020-04-07 NOTE — Progress Notes (Signed)
Safety precautions to be maintained throughout the outpatient stay will include: orient to surroundings, keep bed in low position, maintain call bell within reach at all times, provide assistance with transfer out of bed and ambulation.  

## 2020-04-07 NOTE — Progress Notes (Addendum)
PROVIDER NOTE: Information contained herein reflects review and annotations entered in association with encounter. Interpretation of such information and data should be left to medically-trained personnel. Information provided to patient can be located elsewhere in the medical record under "Patient Instructions". Document created using STT-dictation technology, any transcriptional errors that may result from process are unintentional.    Patient: Shannon Benitez  Service Category: Procedure  Provider: [name], MD  DOB: September 12, 1971  DOS: 04/07/2020  Location: [facility]  MRN: [id number]  Setting: Ambulatory - outpatient  Referring Provider: Milinda Pointer, MD  Type: Established Patient  Specialty: Interventional Pain Management  PCP: Marygrace Drought, MD   Primary Reason for Visit: Interventional Pain Management Treatment. CC: Back Pain, Shoulder Pain, and Leg Pain  Procedure:          Anesthesia, Analgesia, Anxiolysis:  Type: Thermal Lumbar Facet, Medial Branch Radiofrequency Ablation/Neurotomy  #3  Primary Purpose: Therapeutic Region: Posterolateral Lumbosacral Spine Level: L2, L3, L4, L5, & S1 Medial Branch Level(s). These levels will denervate the L3-4, L4-5, and the L5-S1 lumbar facet joints. Laterality: Right  Type: Moderate (Conscious) Sedation combined with Local Anesthesia Indication(s): Analgesia and Anxiety Route: Intravenous (IV) IV Access: Secured Sedation: Meaningful verbal contact was maintained at all times during the procedure  Local Anesthetic: Lidocaine 1-2%  Position: Prone   Indications: 1. Lumbar facet syndrome (Bilateral) (R>L)   2. Spondylosis without myelopathy or radiculopathy, lumbosacral region   3. Lumbar facet hypertrophy (Multilevel) (Bilateral)   4. DDD (degenerative disc disease), lumbosacral   5. Chronic low back pain (Bilateral) (L>R) w/o sciatica   6. Lumbar spondylosis    Shannon Benitez has been dealing with the  above chronic pain for longer than three months and has either failed to respond, was unable to tolerate, or simply did not get enough benefit from other more conservative therapies including, but not limited to: 1. Over-the-counter medications 2. Anti-inflammatory medications 3. Muscle relaxants 4. Membrane stabilizers 5. Opioids 6. Physical therapy and/or chiropractic manipulation 7. Modalities (Heat, ice, etc.) 8. Invasive techniques such as nerve blocks. Shannon Benitez has attained more than 50% relief of the pain from a series of diagnostic injections conducted in separate occasions.  Pain Score: Pre-procedure: 9 /10 Post-procedure: 0-No pain/10  Pre-op Assessment:  Shannon Benitez is a 49 y.o. (year old), female patient, seen today for interventional treatment. She  has a past surgical history that includes Abdominal hysterectomy; Cesarean section; Tumor removal (Left); Cataract extraction w/PHACO (Right, 03/18/2019); and Cataract extraction w/PHACO (Left, 04/13/2019). Shannon Benitez has a current medication list which includes the following prescription(s): aspirin-acetaminophen-caffeine, baclofen, dimethyl fumarate, duloxetine, lamotrigine, levetiracetam, loperamide, meloxicam, metformin, mirabegron er, omeprazole, oxycodone, [START ON 04/18/2020] oxycodone, pregabalin, primidone, propranolol, sitagliptin, hydrocodone-acetaminophen, [START ON 04/14/2020] hydrocodone-acetaminophen, and oxycodone, and the following Facility-Administered Medications: fentanyl and midazolam. Her primarily concern today is the Back Pain, Shoulder Pain, and Leg Pain  Initial Vital Signs:  Pulse/HCG Rate: 78ECG Heart Rate: 73 Temp: (!) 97.3 F (36.3 C) Resp: 18 BP: 107/71 SpO2: 99 %  BMI: Estimated body mass index is 38.55 kg/m as calculated from the following:   Height as of this encounter: 5\' 1"  (1.549 m).   Weight as of this encounter: 204 lb (92.5 kg).  Risk Assessment: Allergies: Reviewed. She is  allergic to 5-alpha reductase inhibitors; glatiramer; rebif [interferon beta-1a]; and latex.  Allergy Precautions: None required Coagulopathies: Reviewed. None identified.  Blood-thinner therapy: None at this time Active Infection(s): Reviewed. None identified. Shannon Benitez is afebrile  Site  Confirmation: Shannon Benitez was asked to confirm the procedure and laterality before marking the site Procedure checklist: Completed Consent: Before the procedure and under the influence of no sedative(s), amnesic(s), or anxiolytics, the patient was informed of the treatment options, risks and possible complications. To fulfill our ethical and legal obligations, as recommended by the American Medical Association's Code of Ethics, I have informed the patient of my clinical impression; the nature and purpose of the treatment or procedure; the risks, benefits, and possible complications of the intervention; the alternatives, including doing nothing; the risk(s) and benefit(s) of the alternative treatment(s) or procedure(s); and the risk(s) and benefit(s) of doing nothing. The patient was provided information about the general risks and possible complications associated with the procedure. These may include, but are not limited to: failure to achieve desired goals, infection, bleeding, organ or nerve damage, allergic reactions, paralysis, and death. In addition, the patient was informed of those risks and complications associated to Spine-related procedures, such as failure to decrease pain; infection (i.e.: Meningitis, epidural or intraspinal abscess); bleeding (i.e.: epidural hematoma, subarachnoid hemorrhage, or any other type of intraspinal or peri-dural bleeding); organ or nerve damage (i.e.: Any type of peripheral nerve, nerve root, or spinal cord injury) with subsequent damage to sensory, motor, and/or autonomic systems, resulting in permanent pain, numbness, and/or weakness of one or several areas of the body;  allergic reactions; (i.e.: anaphylactic reaction); and/or death. Furthermore, the patient was informed of those risks and complications associated with the medications. These include, but are not limited to: allergic reactions (i.e.: anaphylactic or anaphylactoid reaction(s)); adrenal axis suppression; blood sugar elevation that in diabetics may result in ketoacidosis or comma; water retention that in patients with history of congestive heart failure may result in shortness of breath, pulmonary edema, and decompensation with resultant heart failure; weight gain; swelling or edema; medication-induced neural toxicity; particulate matter embolism and blood vessel occlusion with resultant organ, and/or nervous system infarction; and/or aseptic necrosis of one or more joints. Finally, the patient was informed that Medicine is not an exact science; therefore, there is also the possibility of unforeseen or unpredictable risks and/or possible complications that may result in a catastrophic outcome. The patient indicated having understood very clearly. We have given the patient no guarantees and we have made no promises. Enough time was given to the patient to ask questions, all of which were answered to the patient's satisfaction. Shannon Benitez has indicated that she wanted to continue with the procedure. Attestation: I, the ordering provider, attest that I have discussed with the patient the benefits, risks, side-effects, alternatives, likelihood of achieving goals, and potential problems during recovery for the procedure that I have provided informed consent. Date  Time: 04/07/2020  1:09 PM  Pre-Procedure Preparation:  Monitoring: As per clinic protocol. Respiration, ETCO2, SpO2, BP, heart rate and rhythm monitor placed and checked for adequate function Safety Precautions: Patient was assessed for positional comfort and pressure points before starting the procedure. Time-out: I initiated and conducted the  "Time-out" before starting the procedure, as per protocol. The patient was asked to participate by confirming the accuracy of the "Time Out" information. Verification of the correct person, site, and procedure were performed and confirmed by me, the nursing staff, and the patient. "Time-out" conducted as per Joint Commission's Universal Protocol (UP.01.01.01). Time: 1345  Description of Procedure:          Laterality: Right Levels:  L2, L3, L4, L5, & S1 Medial Branch Level(s), at the L3-4, L4-5, and the L5-S1  lumbar facet joints. Area Prepped: Lumbosacral DuraPrep (Iodine Povacrylex [0.7% available iodine] and Isopropyl Alcohol, 74% w/w) Safety Precautions: Aspiration looking for blood return was conducted prior to all injections. At no point did we inject any substances, as a needle was being advanced. Before injecting, the patient was told to immediately notify me if she was experiencing any new onset of "ringing in the ears, or metallic taste in the mouth". No attempts were made at seeking any paresthesias. Safe injection practices and needle disposal techniques used. Medications properly checked for expiration dates. SDV (single dose vial) medications used. After the completion of the procedure, all disposable equipment used was discarded in the proper designated medical waste containers. Local Anesthesia: Protocol guidelines were followed. The patient was positioned over the fluoroscopy table. The area was prepped in the usual manner. The time-out was completed. The target area was identified using fluoroscopy. A 12-in long, straight, sterile hemostat was used with fluoroscopic guidance to locate the targets for each level blocked. Once located, the skin was marked with an approved surgical skin marker. Once all sites were marked, the skin (epidermis, dermis, and hypodermis), as well as deeper tissues (fat, connective tissue and muscle) were infiltrated with a small amount of a short-acting local  anesthetic, loaded on a 10cc syringe with a 25G, 1.5-in  Needle. An appropriate amount of time was allowed for local anesthetics to take effect before proceeding to the next step. Local Anesthetic: Lidocaine 2.0% The unused portion of the local anesthetic was discarded in the proper designated containers. Technical explanation of process:  Radiofrequency Ablation (RFA) L2 Medial Branch Nerve RFA: The target area for the L2 medial branch is at the junction of the postero-lateral aspect of the superior articular process and the superior, posterior, and medial edge of the transverse process of L3. Under fluoroscopic guidance, a Radiofrequency needle was inserted until contact was made with os over the superior postero-lateral aspect of the pedicular shadow (target area). Sensory and motor testing was conducted to properly adjust the position of the needle. Once satisfactory placement of the needle was achieved, the numbing solution was slowly injected after negative aspiration for blood. 2.0 mL of the nerve block solution was injected without difficulty or complication. After waiting for at least 3 minutes, the ablation was performed. Once completed, the needle was removed intact. L3 Medial Branch Nerve RFA: The target area for the L3 medial branch is at the junction of the postero-lateral aspect of the superior articular process and the superior, posterior, and medial edge of the transverse process of L4. Under fluoroscopic guidance, a Radiofrequency needle was inserted until contact was made with os over the superior postero-lateral aspect of the pedicular shadow (target area). Sensory and motor testing was conducted to properly adjust the position of the needle. Once satisfactory placement of the needle was achieved, the numbing solution was slowly injected after negative aspiration for blood. 2.0 mL of the nerve block solution was injected without difficulty or complication. After waiting for at least 3  minutes, the ablation was performed. Once completed, the needle was removed intact. L4 Medial Branch Nerve RFA: The target area for the L4 medial branch is at the junction of the postero-lateral aspect of the superior articular process and the superior, posterior, and medial edge of the transverse process of L5. Under fluoroscopic guidance, a Radiofrequency needle was inserted until contact was made with os over the superior postero-lateral aspect of the pedicular shadow (target area). Sensory and motor testing was conducted to  properly adjust the position of the needle. Once satisfactory placement of the needle was achieved, the numbing solution was slowly injected after negative aspiration for blood. 2.0 mL of the nerve block solution was injected without difficulty or complication. After waiting for at least 3 minutes, the ablation was performed. Once completed, the needle was removed intact. L5 Medial Branch Nerve RFA: The target area for the L5 medial branch is at the junction of the postero-lateral aspect of the superior articular process of S1 and the superior, posterior, and medial edge of the sacral ala. Under fluoroscopic guidance, a Radiofrequency needle was inserted until contact was made with os over the superior postero-lateral aspect of the pedicular shadow (target area). Sensory and motor testing was conducted to properly adjust the position of the needle. Once satisfactory placement of the needle was achieved, the numbing solution was slowly injected after negative aspiration for blood. 2.0 mL of the nerve block solution was injected without difficulty or complication. After waiting for at least 3 minutes, the ablation was performed. Once completed, the needle was removed intact. S1 Medial Branch Nerve RFA: The target area for the S1 medial branch is located inferior to the junction of the S1 superior articular process and the L5 inferior articular process, posterior, inferior, and lateral to the  6 o'clock position of the L5-S1 facet joint, just superior to the S1 posterior foramen. Under fluoroscopic guidance, the Radiofrequency needle was advanced until contact was made with os over the Target area. Sensory and motor testing was conducted to properly adjust the position of the needle. Once satisfactory placement of the needle was achieved, the numbing solution was slowly injected after negative aspiration for blood. 2.0 mL of the nerve block solution was injected without difficulty or complication. After waiting for at least 3 minutes, the ablation was performed. Once completed, the needle was removed intact. Radiofrequency lesioning (ablation):  Radiofrequency Generator: NeuroTherm [id number] Sensory Stimulation Parameters: 50 Hz was used to locate & identify the nerve, making sure that the needle was positioned such that there was no sensory stimulation below 0.3 V or above 0.7 V. Motor Stimulation Parameters: 2 Hz was used to evaluate the motor component. Care was taken not to lesion any nerves that demonstrated motor stimulation of the lower extremities at an output of less than 2.5 times that of the sensory threshold, or a maximum of 2.0 V. Lesioning Technique Parameters: Standard Radiofrequency settings. (Not bipolar or pulsed.) Temperature Settings: 80 degrees C Lesioning time: 60 seconds Intra-operative Compliance: Compliant Materials & Medications: Needle(s) (Electrode/Cannula) Type: Teflon-coated, curved tip, Radiofrequency needle(s) Gauge: 22G Length: 10cm Numbing solution: 0.2% PF-Ropivacaine + Triamcinolone (40 mg/mL) diluted to a final concentration of 4 mg of Triamcinolone/mL of Ropivacaine The unused portion of the solution was discarded in the proper designated containers.  Once the entire procedure was completed, the treated area was cleaned, making sure to leave some of the prepping solution back to take advantage of its long term bactericidal properties.  Illustration of  the posterior view of the lumbar spine and the posterior neural structures. Laminae of L2 through S1 are labeled. DPRL5, dorsal primary ramus of L5; DPRS1, dorsal primary ramus of S1; DPR3, dorsal primary ramus of L3; FJ, facet (zygapophyseal) joint L3-L4; I, inferior articular process of L4; LB1, lateral branch of dorsal primary ramus of L1; IAB, inferior articular branches from L3 medial branch (supplies L4-L5 facet joint); IBP, intermediate branch plexus; MB3, medial branch of dorsal primary ramus of L3; NR3, third lumbar nerve  root; S, superior articular process of L5; SAB, superior articular branches from L4 (supplies L4-5 facet joint also); TP3, transverse process of L3.  Vitals:   04/07/20 1414 04/07/20 1424 04/07/20 1434 04/07/20 1446  BP: 100/65 104/69 102/74 101/76  Pulse: 71     Resp: 16 18 15 15   Temp:  (!) 97.3 F (36.3 C)  (!) 97.2 F (36.2 C)  SpO2: 97% 98% 99% 99%  Weight:      Height:       Start Time: 1345 hrs. End Time: 1414 hrs.  Imaging Guidance (Spinal):          Type of Imaging Technique: Fluoroscopy Guidance (Spinal) Indication(s): Assistance in needle guidance and placement for procedures requiring needle placement in or near specific anatomical locations not easily accessible without such assistance. Exposure Time: Please see nurses notes. Contrast: None used. Fluoroscopic Guidance: I was personally present during the use of fluoroscopy. "Tunnel Vision Technique" used to obtain the best possible view of the target area. Parallax error corrected before commencing the procedure. "Direction-depth-direction" technique used to introduce the needle under continuous pulsed fluoroscopy. Once target was reached, antero-posterior, oblique, and lateral fluoroscopic projection used confirm needle placement in all planes. Images permanently stored in EMR. Interpretation: No contrast injected. I personally interpreted the imaging intraoperatively. Adequate needle placement  confirmed in multiple planes. Permanent images saved into the patient's record.  Antibiotic Prophylaxis:   Anti-infectives (From admission, onward)   None     Indication(s): None identified  Post-operative Assessment:  Post-procedure Vital Signs:  Pulse/HCG Rate: 7175 Temp: (!) 97.2 F (36.2 C) Resp: 15 BP: 101/76 SpO2: 99 %  EBL: None  Complications: No immediate post-treatment complications observed by team, or reported by patient.  Note: The patient tolerated the entire procedure well. A repeat set of vitals were taken after the procedure and the patient was kept under observation following institutional policy, for this type of procedure. Post-procedural neurological assessment was performed, showing return to baseline, prior to discharge. The patient was provided with post-procedure discharge instructions, including a section on how to identify potential problems. Should any problems arise concerning this procedure, the patient was given instructions to immediately contact us, at any time, without hesitation. In any case, we plan to contact the patient by telephone for a follow-up status report regarding this interventional procedure.  Comments:  No additional relevant information.  Plan of Care  Orders:  Orders Placed This Encounter  Procedures  . Radiofrequency,Lumbar    Scheduling Instructions:     Side(s): Right-sided     Level: L3-4, L4-5, & L5-S1 Facets (L2, L3, L4, L5, & S1 Medial Branch Nerves)     Sedation: With Sedation.     Timeframe: Today    Order Specific Question:   Where will this procedure be performed?    Answer:   [facility]  . DG [facility] C-ARM 1-60 MIN NO REPORT    Intraoperative interpretation by procedural physician at [place].    Standing Status:   Standing    Number of Occurrences:   1    Order Specific Question:   Reason for exam:    Answer:   Assistance in needle guidance and placement for procedures requiring  needle placement in or near specific anatomical locations not easily accessible without such assistance.  . Informed Consent Details: Physician/Practitioner Attestation; Transcribe to consent form and obtain patient signature    Nursing Order: Transcribe to consent form and obtain patient signature. Note: Always  confirm laterality of pain with Shannon Benitez, before procedure. Procedure: Lumbar Facet Radiofrequency Ablation Indication/Reason: Low Back Pain, with our without leg pain, due to Facet Joint Arthralgia (Joint Pain) known as Lumbar Facet Syndrome, secondary to Lumbar, and/or Lumbosacral Spondylosis (Arthritis of the Spine), without myelopathy or radiculopathy (Nerve Damage). Provider Attestation: I, [place] Dossie Arbour, MD, (Pain Management Specialist), the physician/practitioner, attest that I have discussed with the patient the benefits, risks, side effects, alternatives, likelihood of achieving goals and potential problems during recovery for the procedure that I have provided informed consent.  . Provide equipment / supplies at bedside    Equipment required: Sterile "Radiofrequency Tray"; Large hemostat (1); Small hemostat (1); Towels (6-8); 4x4 sterile sponge pack (1) Radiofrequency Needle(s): Size: Long Quantity: 5    Standing Status:   Standing    Number of Occurrences:   1    Order Specific Question:   Specify    Answer:   Radiofrequency Tray   Chronic Opioid Analgesic:  Oxycodone IR 5 mg, 1 tab PO BID (10 mg/day of oxycodone)  MME/day:15mg /day.   Medications ordered for procedure: Meds ordered this encounter  Medications  . lidocaine (XYLOCAINE) 2 % (with pres) injection 400 mg  . lactated ringers infusion 1,000 mL  . midazolam (VERSED) 5 MG/5ML injection 1-2 mg    Make sure Flumazenil is available in the pyxis when using this medication. If oversedation occurs, administer 0.2 mg IV over 15 sec. If after 45 sec no response, administer 0.2 mg again over 1 min; may  repeat at 1 min intervals; not to exceed 4 doses (1 mg)  . fentaNYL (SUBLIMAZE) injection 25-50 mcg    Make sure Narcan is available in the pyxis when using this medication. In the event of respiratory depression (RR< 8/min): Titrate NARCAN (naloxone) in increments of 0.1 to 0.2 mg IV at 2-3 minute intervals, until desired degree of reversal.  . ropivacaine (PF) 2 mg/mL (0.2%) (NAROPIN) injection 9 mL  . triamcinolone acetonide (KENALOG-40) injection 40 mg  . HYDROcodone-acetaminophen (NORCO/VICODIN) 5-325 MG tablet    Sig: Take 1 tablet by mouth every 6 (six) hours as needed for up to 7 days for severe pain. Must last 7 days.    Dispense:  28 tablet    Refill:  0    For acute post-operative pain. Not to be refilled. Must last 7 days.  Marland Kitchen HYDROcodone-acetaminophen (NORCO/VICODIN) 5-325 MG tablet    Sig: Take 1 tablet by mouth every 6 (six) hours as needed for up to 7 days for severe pain. Must last 7 days.    Dispense:  28 tablet    Refill:  0    For acute post-operative pain. Not to be refilled.  Must last 7 days.   Medications administered: We administered lidocaine, lactated ringers, midazolam, fentaNYL, ropivacaine (PF) 2 mg/mL (0.2%), and triamcinolone acetonide.  See the medical record for exact dosing, route, and time of administration.  Follow-up plan:   No follow-ups on file.       Interventional management options: Considering:   Continue encouraging the patient to bring her BMI below 35. Diagnostic bilateral T11-12 lumbar facet block   Diagnostic right T7-8 TESI   Diagnostic right T7 TFESI   Diagnostic ML CESI     Palliative PRN treatment(s):   Palliative bilateral lumbar facet block #4  Palliative left lumbar facet RFA #3 (last done on 10/27/2019)  Palliative right lumbar facet RFA #3 (last done on 10/08/2019)     Recent Visits Date Type  Provider Dept  03/03/20 Procedure visit Milinda Pointer, MD [facility]  02/11/20 Telemedicine Milinda Pointer, MD  [facility]  Showing recent visits within past 90 days and meeting all other requirements   Today's Visits Date Type Provider Dept  04/07/20 Procedure visit Milinda Pointer, MD [facility]  Showing today's visits and meeting all other requirements   Future Appointments Date Type Provider Dept  05/12/20 Appointment Milinda Pointer, [facility]  05/23/20 Appointment Milinda Pointer, MD [facility]  Showing future appointments within next 90 days and meeting all other requirements   Disposition: Discharge home  Discharge (Date  Time): 04/07/2020; 1450 hrs.   Primary Care Physician: Marygrace Drought, MD Location: [facility] Note by: [name], MD Date: 04/07/2020; Time: 3:57 PM  Disclaimer:  Medicine is not an Chief Strategy Officer. The only guarantee in medicine is that nothing is guaranteed. It is important to note that the decision to proceed with this intervention was based on the information collected from the patient. The Data and conclusions were drawn from the patient's questionnaire, the interview, and the physical examination. Because the information was provided in large part by the patient, it cannot be guaranteed that it has not been purposely or unconsciously manipulated. Every effort has been made to obtain as much relevant data as possible for this evaluation. It is important to note that the conclusions that lead to this procedure are derived in large part from the available data. Always take into account that the treatment will also be dependent on availability of resources and existing treatment guidelines, considered by other Pain Management Practitioners as being common knowledge and practice, at the time of the intervention. For Medico-Legal purposes, it is also important to point out that variation in procedural techniques and pharmacological choices are the acceptable norm. The indications,  contraindications, technique, and results of the above procedure should only be interpreted and judged by a Board-Certified Interventional Pain Specialist with extensive familiarity and expertise in the same exact procedure and technique.

## 2020-04-08 ENCOUNTER — Telehealth: Payer: Self-pay | Admitting: *Deleted

## 2020-04-08 NOTE — Telephone Encounter (Signed)
No problems post procedure. 

## 2020-04-19 ENCOUNTER — Encounter: Payer: Self-pay | Admitting: Pain Medicine

## 2020-04-19 NOTE — Progress Notes (Signed)
Patient: Shannon Benitez  Service Category: E/M  Provider: [name], MD  DOB: 10-22-1971  DOS: 04/20/2020  Location: Office  MRN: [id number]  Setting: Ambulatory outpatient  Referring Provider: Marygrace Drought, MD  Type: Established Patient  Specialty: Interventional Pain Management  PCP: Marygrace Drought, MD  Location: Remote location  Delivery: TeleHealth     Virtual Encounter - Pain Management PROVIDER NOTE: Information contained herein reflects review and annotations entered in association with encounter. Interpretation of such information and data should be left to medically-trained personnel. Information provided to patient can be located elsewhere in the medical record under "Patient Instructions". Document created using STT-dictation technology, any transcriptional errors that may result from process are unintentional.    Contact & Pharmacy Preferred: [phone] Home: [phone] (home) Mobile: [phone] (mobile) E-mail: [email or web address]  [place] [place] [address] [place] [address] Phone: [phone] Fax: [phone]  [id number] [id number]: [phone]: [phone]  Pre-screening  Shannon Benitez offered "in-person" vs "virtual" encounter. She indicated preferring virtual for this encounter.   Reason COVID-19*  Social distancing based on CDC and AMA recommendations.   I contacted Shannon Benitez on 04/20/2020 via telephone.      I clearly identified myself as FGaspar Cola MD. I verified that I was speaking with the correct person using two identifiers (Name: EMalaiya Benitez and date of birth: [birth year].  Consent I [facility]. I informed Shannon Benitez of possible security and privacy concerns, risks, and limitations associated with providing "not-in-person"  medical evaluation and management services. I also informed Ms. Kicklighter of the availability of "in-person" appointments. Finally, I informed her that there would be a charge for the virtual visit and that she could be  personally, fully or partially, financially responsible for it. Ms. WRoccoexpressed understanding and agreed to proceed.   Historic Elements   Ms. EShandie Benitez a 49y.o. year old, female patient evaluated today after her last contact with our practice on 04/08/2020. Shannon Benitez has a past medical history of Acute postoperative pain (04/30/2019), Diabetes mellitus without complication ([place], GERD (gastroesophageal reflux disease), Headache, MS (multiple sclerosis) ([place], Numbness and tingling of both legs below knees, Osteoarthritis, Vertigo, and Wears dentures. She also  has a past surgical history that includes Abdominal hysterectomy; Cesarean section; Tumor removal (Left); Cataract extraction w/PHACO (Right, 03/18/2019); and Cataract extraction w/PHACO (Left, 04/13/2019). Ms. WGsellhas a current medication list which includes the following prescription(s): aspirin-acetaminophen-caffeine, baclofen, dimethyl fumarate, duloxetine, lamotrigine, levetiracetam, loperamide, meloxicam, mirabegron er, [START ON 05/18/2020] oxycodone, [START ON 06/17/2020] oxycodone, [START ON 07/17/2020] oxycodone, pregabalin, propranolol, metformin, omeprazole, primidone, and sitagliptin. She  reports that she quit smoking about 3 years ago. Her smoking use included cigarettes. She has a 9.00 pack-year smoking history. She has quit using smokeless tobacco. She reports that she does not drink alcohol. No history on file for drug use. Ms. WCampisiis allergic to 5-alpha reductase inhibitors, glatiramer, rebif [interferon beta-1a], and latex.   HPI  Today, she is being contacted for both, medication management and a post-procedure assessment. The patient indicates doing well with the current  medication regimen. No adverse reactions or side effects reported to the medications.  The patient indicates having done great after her right lumbar facet RFA #4.  She describes being anxious to come in to have  the other side done, as soon as possible.  In reviewing the schedule, it would appear that she had a return appointment for 05/12/2020, which is my procedure day, but when I looked at the schedule it said that she was coming in for a face-to-face medication management visit.  Today I will talk to my staff to see if I can fix those problems and I will have them give her a call for the correct date.  I will also see if we can get the procedure done a little sooner.  Today the patient also took the opportunity to talk to me about the pain in her left shoulder blade which she wants to know if there is anything that I can do about that.  Apparently on 04/01/2020 the patient was climbing the steps of the [place] court house when she missed the railing and fell backwards hitting herself in the shoulder, shoulder blade, and the back of the head.  She came to the [facility] emergency department where a CT scan of the head and cervical spine were done.  There was no fracture.  She also had some x-rays of her shoulder, but no comments or x-rays were made of her shoulder blade where she indicates having had a rather large bruise and still having pain.  Today I will go ahead and order some x-rays of her shoulder blade to determine if by any chance she had any injuries to that area.  Assuming that there is nothing that can be surgically done, I may be able to offer her some injection treatment into the area to see if I can bring that pain down for her.  Post-Procedure Evaluation  Procedure: Therapeutic right lumbar facet RFA #4 under fluoroscopic guidance and IV sedation Pre-procedure pain level: 9/10 Post-procedure: 0/10 (100% relief)  Sedation: Sedation provided.  Effectiveness during initial hour after  procedure(Ultra-Short Term Relief): 100 %.  Local anesthetic used: Long-acting (4-6 hours) Effectiveness: Defined as any analgesic benefit obtained secondary to the administration of local anesthetics. This carries significant diagnostic value as to the etiological location, or anatomical origin, of the pain. Duration of benefit is expected to coincide with the duration of the local anesthetic used.  Effectiveness during initial 4-6 hours after procedure(Short-Term Relief): 100 %.  Long-term benefit: Defined as any relief past the pharmacologic duration of the local anesthetics.  Effectiveness past the initial 6 hours after procedure(Long-Term Relief): 95 %.  Current benefits: Defined as benefit that persist at this time.   Analgesia:  90-100% better Function: Shannon Benitez reports improvement in function ROM: Shannon Benitez reports improvement in ROM  Pharmacotherapy Assessment  Analgesic: Oxycodone IR 5 mg, 1 tab PO BID (10 mg/day of oxycodone)  MME/day:5m/day.   Monitoring: [place] PMP: PDMP reviewed during this encounter.       Pharmacotherapy: No side-effects or adverse reactions reported. Compliance: No problems identified. Effectiveness: Clinically acceptable. Plan: Refer to "POC".  UDS:  Summary  Date Value Ref Range Status  11/27/2018 FINAL  Final    Comment:    ==================================================================== TOXASSURE COMP DRUG ANALYSIS,UR ==================================================================== Test                             Result       Flag       Units Drug Present and Declared for Prescription Verification   7-aminoclonazepam              319  EXPECTED   ng/mg creat    7-aminoclonazepam is an expected metabolite of clonazepam. Source    of clonazepam is a scheduled prescription medication.   Primidone                      PRESENT      EXPECTED   Phenobarbital                  PRESENT      EXPECTED    Phenobarbital is an  expected metabolite of primidone;    Phenobarbital may also be administered as a prescription drug.   Gabapentin                     PRESENT      EXPECTED   Lamotrigine                    PRESENT      EXPECTED   Baclofen                       PRESENT      EXPECTED   Duloxetine                     PRESENT      EXPECTED   Propranolol                    PRESENT      EXPECTED Drug Present not Declared for Prescription Verification   Metaxalone                     PRESENT      UNEXPECTED   Acetaminophen                  PRESENT      UNEXPECTED   Diphenhydramine                PRESENT      UNEXPECTED ==================================================================== Test                      Result    Flag   Units      Ref Range   Creatinine              47               mg/dL      >=20 ==================================================================== Declared Medications:  The flagging and interpretation on this report are based on the  following declared medications.  Unexpected results may arise from  inaccuracies in the declared medications.  **Note: The testing scope of this panel includes these medications:  Baclofen (Lioresal)  Clonazepam (Klonopin)  Duloxetine (Cymbalta)  Gabapentin (Neurontin)  Lamotrigine (Lamictal)  Primidone (Mysoline)  Propranolol (Inderal)  **Note: The testing scope of this panel does not include following  reported medications:  Dimethyl Fumarate  Metformin (Glucophage)  Mirabegron (Myrbetriq)  Omeprazole (Prilosec)  Sitagliptin (Januvia) ==================================================================== For clinical consultation, please call [phone]. ====================================================================     Laboratory Chemistry Profile   Renal Lab Results  Component Value Date   BUN 13 09/25/2019   CREATININE 0.53 09/25/2019   BCR 13 11/27/2018   GFRAA >60 09/25/2019   GFRNONAA >60 09/25/2019     Hepatic Lab  Results  Component Value Date   AST 17 11/27/2018   ALT 35 08/18/2018   ALBUMIN 4.1 11/27/2018   ALKPHOS 91 11/27/2018  Electrolytes Lab Results  Component Value Date   NA 136 09/25/2019   K 3.8 09/25/2019   CL 101 09/25/2019   CALCIUM 8.8 (L) 09/25/2019   MG 2.2 11/27/2018     Bone Lab Results  Component Value Date   25OHVITD1 55 11/27/2018   25OHVITD2 <1.0 11/27/2018   25OHVITD3 55 11/27/2018     Inflammation (CRP: Acute Phase) (ESR: Chronic Phase) Lab Results  Component Value Date   CRP 10 11/27/2018   ESRSEDRATE 42 (H) 11/27/2018       Note: Above Lab results reviewed.   Imaging  DG PAIN CLINIC C-ARM 1-60 MIN NO REPORT Fluoro was used, but no Radiologist interpretation will be provided.  Please refer to "NOTES" tab for provider progress note.  Assessment  The primary encounter diagnosis was Chronic pain syndrome. Diagnoses of Lumbar facet syndrome (Bilateral) (R>L), Pharmacologic therapy, Scapulalgia (Left), and Traumatic injury of scapula (Left) were also pertinent to this visit.  Plan of Care  Problem-specific:  No problem-specific Assessment & Plan notes found for this encounter.  Shannon Benitez has a current medication list which includes the following long-term medication(s): baclofen, duloxetine, lamotrigine, levetiracetam, meloxicam, metformin, omeprazole, [START ON 05/18/2020] oxycodone, [START ON 06/17/2020] oxycodone, [START ON 07/17/2020] oxycodone, pregabalin, primidone, propranolol, and sitagliptin.  Pharmacotherapy (Medications Ordered): Meds ordered this encounter  Medications  . oxyCODONE (OXY IR/ROXICODONE) 5 MG immediate release tablet    Sig: Take 1 tablet (5 mg total) by mouth 2 (two) times daily as needed for severe pain. Must last 30 days.    Dispense:  60 tablet    Refill:  0    Chronic Pain: STOP Act (Not applicable) Fill 1 day early if closed on refill date. Do not fill until: 05/18/2020. To last until: 06/17/2020. Avoid  benzodiazepines within 8 hours of opioids  . oxyCODONE (OXY IR/ROXICODONE) 5 MG immediate release tablet    Sig: Take 1 tablet (5 mg total) by mouth 2 (two) times daily as needed for severe pain. Must last 30 days.    Dispense:  60 tablet    Refill:  0    Chronic Pain: STOP Act (Not applicable) Fill 1 day early if closed on refill date. Do not fill until: 06/17/2020. To last until: 07/17/2020. Avoid benzodiazepines within 8 hours of opioids  . oxyCODONE (OXY IR/ROXICODONE) 5 MG immediate release tablet    Sig: Take 1 tablet (5 mg total) by mouth 2 (two) times daily as needed for severe pain. Must last 30 days.    Dispense:  60 tablet    Refill:  0    Chronic Pain: STOP Act (Not applicable) Fill 1 day early if closed on refill date. Do not fill until: 07/17/2020. To last until: 08/16/2020. Avoid benzodiazepines within 8 hours of opioids   Orders:  Orders Placed This Encounter  Procedures  . Radiofrequency,Lumbar    Standing Status:   Future    Standing Expiration Date:   04/20/2021    Scheduling Instructions:     Side(s): Left-sided     Level: L3-4, L4-5, & L5-S1 Facets (L2, L3, L4, L5, & S1 Medial Branch Nerves)     Sedation: With Sedation.     Scheduling Timeframe: As soon as possible.    Order Specific Question:   Where will this procedure be performed?    Answer:   [facility]  . DG Scapula Left    Standing Status:   Future    Standing Expiration Date:   04/20/2021  Order Specific Question:   Reason for Exam (SYMPTOM  OR DIAGNOSIS REQUIRED)    Answer:   Trauma to the left scapula    Order Specific Question:   Is patient pregnant?    Answer:   No    Order Specific Question:   Preferred imaging location?    Answer:   [facility]    Order Specific Question:   Call Results- Best Contact Number?    Answer:   [phone]    Order Specific Question:   Radiology Contrast Protocol - do NOT remove file path    Answer:    [email or web address].pdf  . ToxASSURE Select 13 (MW), Urine    Volume: 30 ml(s). Minimum 3 ml of urine is needed. Document temperature of fresh sample. Indications: Long term (current) use of opiate analgesic (L45.625)    Order Specific Question:   Release to patient    Answer:   Immediate   Follow-up plan:   Return for RFA (w/ sedation): (L) L-FCT RFA #3, (ASAP) and to evaluate shoulder blade.      Interventional management options: Considering:   Continue encouraging the patient to bring her BMI below 35. Diagnostic bilateral T11-12 lumbar facet block   Diagnostic right T7-8 TESI   Diagnostic right T7 TFESI   Diagnostic ML CESI     Palliative PRN treatment(s):   Palliative bilateral lumbar facet block #4  Palliative left lumbar facet RFA #3 (last done on 10/27/2019)  Palliative right lumbar facet RFA #3 (last done on 10/08/2019)      Recent Visits Date Type Provider Dept  04/07/20 Procedure visit Milinda Pointer, MD [facility]  03/03/20 Procedure visit Milinda Pointer, MD [facility]  02/11/20 Telemedicine Milinda Pointer, MD [facility]  Showing recent visits within past 90 days and meeting all other requirements Today's Visits Date Type Provider Dept  04/20/20 Telemedicine Milinda Pointer, MD [facility]  Showing today's visits and meeting all other requirements Future Appointments Date Type Provider Dept  05/12/20 Appointment Milinda Pointer, MD [facility]  Showing future appointments within next 90 days and meeting all other requirements  I discussed the assessment and treatment plan with the patient. The patient was provided an opportunity to ask questions and all were answered. The patient agreed with the plan and demonstrated an understanding of the instructions.  Patient advised to call back or seek an in-person evaluation if the symptoms or condition  worsens.  Duration of encounter: 30 minutes.  Note by: [name], MD Date: 04/20/2020; Time: 2:41 PM

## 2020-04-20 ENCOUNTER — Other Ambulatory Visit: Payer: Self-pay

## 2020-04-20 ENCOUNTER — Ambulatory Visit: Payer: Medicaid Other | Attending: Pain Medicine | Admitting: Pain Medicine

## 2020-04-20 DIAGNOSIS — M47816 Spondylosis without myelopathy or radiculopathy, lumbar region: Secondary | ICD-10-CM | POA: Diagnosis not present

## 2020-04-20 DIAGNOSIS — G894 Chronic pain syndrome: Secondary | ICD-10-CM | POA: Diagnosis not present

## 2020-04-20 DIAGNOSIS — S4990XA Unspecified injury of shoulder and upper arm, unspecified arm, initial encounter: Secondary | ICD-10-CM

## 2020-04-20 DIAGNOSIS — Z79899 Other long term (current) drug therapy: Secondary | ICD-10-CM | POA: Diagnosis not present

## 2020-04-20 DIAGNOSIS — M898X1 Other specified disorders of bone, shoulder: Secondary | ICD-10-CM | POA: Insufficient documentation

## 2020-04-20 MED ORDER — OXYCODONE HCL 5 MG PO TABS: 5.0000 mg | ORAL_TABLET | Freq: Two times a day (BID) | ORAL | 0 refills | Status: DC | PRN

## 2020-04-20 NOTE — Patient Instructions (Signed)
____________________________________________________________________________________________  Preparing for Procedure with Sedation  Procedure appointments are limited to planned procedures: . No Prescription Refills. . No disability issues will be discussed. . No medication changes will be discussed.  Instructions: . Oral Intake: Do not eat or drink anything for at least 8 hours prior to your procedure. (Exception: Blood Pressure Medication. See below.) . Transportation: Unless otherwise stated by your physician, you may drive yourself after the procedure. . Blood Pressure Medicine: Do not forget to take your blood pressure medicine with a sip of water the morning of the procedure. If your Diastolic (lower reading)is above 100 mmHg, elective cases will be cancelled/rescheduled. . Blood thinners: These will need to be stopped for procedures. Notify our staff if you are taking any blood thinners. Depending on which one you take, there will be specific instructions on how and when to stop it. . Diabetics on insulin: Notify the staff so that you can be scheduled 1st case in the morning. If your diabetes requires high dose insulin, take only  of your normal insulin dose the morning of the procedure and notify the staff that you have done so. . Preventing infections: Shower with an antibacterial soap the morning of your procedure. . Build-up your immune system: Take 1000 mg of Vitamin C with every meal (3 times a day) the day prior to your procedure. . Antibiotics: Inform the staff if you have a condition or reason that requires you to take antibiotics before dental procedures. . Pregnancy: If you are pregnant, call and cancel the procedure. . Sickness: If you have a cold, fever, or any active infections, call and cancel the procedure. . Arrival: You must be in the facility at least 30 minutes prior to your scheduled procedure. . Children: Do not bring children with you. . Dress appropriately:  Bring dark clothing that you would not mind if they get stained. . Valuables: Do not bring any jewelry or valuables.  Reasons to call and reschedule or cancel your procedure: (Following these recommendations will minimize the risk of a serious complication.) . Surgeries: Avoid having procedures within 2 weeks of any surgery. (Avoid for 2 weeks before or after any surgery). . Flu Shots: Avoid having procedures within 2 weeks of a flu shots or . (Avoid for 2 weeks before or after immunizations). . Barium: Avoid having a procedure within 7-10 days after having had a radiological study involving the use of radiological contrast. (Myelograms, Barium swallow or enema study). . Heart attacks: Avoid any elective procedures or surgeries for the initial 6 months after a "Myocardial Infarction" (Heart Attack). . Blood thinners: It is imperative that you stop these medications before procedures. Let us know if you if you take any blood thinner.  . Infection: Avoid procedures during or within two weeks of an infection (including chest colds or gastrointestinal problems). Symptoms associated with infections include: Localized redness, fever, chills, night sweats or profuse sweating, burning sensation when voiding, cough, congestion, stuffiness, runny nose, sore throat, diarrhea, nausea, vomiting, cold or Flu symptoms, recent or current infections. It is specially important if the infection is over the area that we intend to treat. . Heart and lung problems: Symptoms that may suggest an active cardiopulmonary problem include: cough, chest pain, breathing difficulties or shortness of breath, dizziness, ankle swelling, uncontrolled high or unusually low blood pressure, and/or palpitations. If you are experiencing any of these symptoms, cancel your procedure and contact your primary care physician for an evaluation.  Remember:  Regular Business hours are:    Monday to Thursday 8:00 AM to 4:00 PM  Provider's  Schedule: Cira Deyoe, MD:  Procedure days: Tuesday and Thursday 7:30 AM to 4:00 PM  Bilal Lateef, MD:  Procedure days: Monday and Wednesday 7:30 AM to 4:00 PM ____________________________________________________________________________________________    

## 2020-05-12 ENCOUNTER — Ambulatory Visit: Payer: Medicaid Other | Admitting: Pain Medicine

## 2020-05-19 ENCOUNTER — Telehealth: Payer: Medicaid Other | Admitting: Pain Medicine

## 2020-05-23 ENCOUNTER — Telehealth: Payer: Medicaid Other | Admitting: Pain Medicine

## 2020-05-24 ENCOUNTER — Ambulatory Visit (HOSPITAL_BASED_OUTPATIENT_CLINIC_OR_DEPARTMENT_OTHER): Payer: Medicaid Other | Admitting: Pain Medicine

## 2020-05-24 ENCOUNTER — Ambulatory Visit
Admission: RE | Admit: 2020-05-24 | Discharge: 2020-05-24 | Disposition: A | Discharge status: Home / Self Care | Payer: Medicaid Other | Source: Ambulatory Visit | Attending: Pain Medicine | Admitting: Pain Medicine

## 2020-05-24 ENCOUNTER — Encounter: Payer: Self-pay | Admitting: Pain Medicine

## 2020-05-24 ENCOUNTER — Other Ambulatory Visit: Payer: Self-pay

## 2020-05-24 VITALS — BP 103/51 | HR 73 | Temp 98.0°F | Resp 18 | Ht 61.0 in | Wt 200.0 lb

## 2020-05-24 DIAGNOSIS — M47816 Spondylosis without myelopathy or radiculopathy, lumbar region: Secondary | ICD-10-CM | POA: Insufficient documentation

## 2020-05-24 DIAGNOSIS — M5137 Other intervertebral disc degeneration, lumbosacral region: Secondary | ICD-10-CM | POA: Insufficient documentation

## 2020-05-24 DIAGNOSIS — G8929 Other chronic pain: Secondary | ICD-10-CM | POA: Diagnosis present

## 2020-05-24 DIAGNOSIS — M51379 Other intervertebral disc degeneration, lumbosacral region without mention of lumbar back pain or lower extremity pain: Secondary | ICD-10-CM

## 2020-05-24 DIAGNOSIS — G8918 Other acute postprocedural pain: Secondary | ICD-10-CM

## 2020-05-24 DIAGNOSIS — M545 Low back pain: Secondary | ICD-10-CM | POA: Diagnosis not present

## 2020-05-24 DIAGNOSIS — M47817 Spondylosis without myelopathy or radiculopathy, lumbosacral region: Secondary | ICD-10-CM

## 2020-05-24 MED ORDER — HYDROCODONE-ACETAMINOPHEN 5-325 MG PO TABS: 1.0000 | ORAL_TABLET | Freq: Four times a day (QID) | ORAL | 0 refills | Status: AC | PRN

## 2020-05-24 MED ORDER — LIDOCAINE HCL 2 % IJ SOLN
20.0000 mL | Freq: Once | INTRAMUSCULAR | Status: AC
  Administered 2020-05-24: 400 mg
  Filled 2020-05-24: qty 20

## 2020-05-24 MED ORDER — FENTANYL CITRATE (PF) 100 MCG/2ML IJ SOLN
25.0000 ug | INTRAMUSCULAR | Status: DC | PRN
  Administered 2020-05-24: 50 ug via INTRAVENOUS
  Filled 2020-05-24: qty 2

## 2020-05-24 MED ORDER — ROPIVACAINE HCL 2 MG/ML IJ SOLN
9.0000 mL | Freq: Once | INTRAMUSCULAR | Status: AC
  Administered 2020-05-24: 9 mL via PERINEURAL
  Filled 2020-05-24: qty 10

## 2020-05-24 MED ORDER — MIDAZOLAM HCL 5 MG/5ML IJ SOLN
1.0000 mg | INTRAMUSCULAR | Status: DC | PRN
  Administered 2020-05-24: 1 mg via INTRAVENOUS
  Filled 2020-05-24: qty 5

## 2020-05-24 MED ORDER — TRIAMCINOLONE ACETONIDE 40 MG/ML IJ SUSP
40.0000 mg | Freq: Once | INTRAMUSCULAR | Status: AC
  Administered 2020-05-24: 40 mg
  Filled 2020-05-24: qty 1

## 2020-05-24 MED ORDER — LACTATED RINGERS IV SOLN
1000.0000 mL | Freq: Once | INTRAVENOUS | Status: AC
  Administered 2020-05-24: 1000 mL via INTRAVENOUS

## 2020-05-24 NOTE — Progress Notes (Signed)
Safety precautions to be maintained throughout the outpatient stay will include: orient to surroundings, keep bed in low position, maintain call bell within reach at all times, provide assistance with transfer out of bed and ambulation.  

## 2020-05-24 NOTE — Patient Instructions (Addendum)

## 2020-05-24 NOTE — Progress Notes (Signed)
PROVIDER NOTE: Information contained herein reflects review and annotations entered in association with encounter. Interpretation of such information and data should be left to medically-trained personnel. Information provided to patient can be located elsewhere in the medical record under "Patient Instructions". Document created using STT-dictation technology, any transcriptional errors that may result from process are unintentional.    Patient: Shannon Benitez  Service Category: Procedure  Provider: [name], MD  DOB: September 12, 1971  DOS: 05/24/2020  Location: [facility]  MRN: [id number]  Setting: Ambulatory - outpatient  Referring Provider: Marygrace Drought, MD  Type: Established Patient  Specialty: Interventional Pain Management  PCP: Marygrace Drought, MD   Primary Reason for Visit: Interventional Pain Management Treatment. CC: Back Pain (lower)  Procedure:          Anesthesia, Analgesia, Anxiolysis:  Type: Thermal Lumbar Facet, Medial Branch Radiofrequency Ablation/Neurotomy  #3  Primary Purpose: Therapeutic Region: Posterolateral Lumbosacral Spine Level: L2, L3, L4, L5, & S1 Medial Branch Level(s). These levels will denervate the L3-4, L4-5, and the L5-S1 lumbar facet joints. Laterality: Left  Type: Moderate (Conscious) Sedation combined with Local Anesthesia Indication(s): Analgesia and Anxiety Route: Intravenous (IV) IV Access: Secured Sedation: Meaningful verbal contact was maintained at all times during the procedure  Local Anesthetic: Lidocaine 1-2%  Position: Prone   Indications: 1. Lumbar facet syndrome (Bilateral) (R>L)   2. Spondylosis without myelopathy or radiculopathy, lumbosacral region   3. Lumbar facet hypertrophy (Multilevel) (Bilateral)   4. DDD (degenerative disc disease), lumbosacral   5. Chronic low back pain (Bilateral) (L>R) w/o sciatica   6. Lumbar spondylosis    Shannon Benitez has been dealing with the above chronic pain for  longer than three months and has either failed to respond, was unable to tolerate, or simply did not get enough benefit from other more conservative therapies including, but not limited to: 1. Over-the-counter medications 2. Anti-inflammatory medications 3. Muscle relaxants 4. Membrane stabilizers 5. Opioids 6. Physical therapy and/or chiropractic manipulation 7. Modalities (Heat, ice, etc.) 8. Invasive techniques such as nerve blocks. Shannon Benitez has attained more than 50% relief of the pain from a series of diagnostic injections conducted in separate occasions.  Pain Score: Pre-procedure: 10-Worst pain ever/10 Post-procedure: 0-No pain/10  Pre-op Assessment:  Shannon Benitez is a 49 y.o. (year old), female patient, seen today for interventional treatment. She  has a past surgical history that includes Abdominal hysterectomy; Cesarean section; Tumor removal (Left); Cataract extraction w/PHACO (Right, 03/18/2019); and Cataract extraction w/PHACO (Left, 04/13/2019). Shannon Benitez has a current medication list which includes the following prescription(s): aspirin-acetaminophen-caffeine, baclofen, dimethyl fumarate, duloxetine, lamotrigine, levetiracetam, loperamide, meloxicam, mirabegron er, oxycodone, [START ON 06/17/2020] oxycodone, [START ON 07/17/2020] oxycodone, pregabalin, propranolol, hydrocodone-acetaminophen, [START ON 05/31/2020] hydrocodone-acetaminophen, metformin, omeprazole, primidone, and sitagliptin, and the following Facility-Administered Medications: fentanyl and midazolam. Her primarily concern today is the Back Pain (lower)  Initial Vital Signs:  Pulse/HCG Rate: 73ECG Heart Rate: 68 Temp: 97.6 F (36.4 C) Resp: 16 BP: 106/71 SpO2: 97 %  BMI: Estimated body mass index is 37.79 kg/m as calculated from the following:   Height as of this encounter: 5\' 1"  (1.549 m).   Weight as of this encounter: 200 lb (90.7 kg).  Risk Assessment: Allergies: Reviewed. She is allergic to  5-alpha reductase inhibitors, glatiramer, rebif [interferon beta-1a], and latex.  Allergy Precautions: None required Coagulopathies: Reviewed. None identified.  Blood-thinner therapy: None at this time Active Infection(s): Reviewed. None identified. Shannon Benitez is afebrile  Site Confirmation: Shannon Benitez was asked  to confirm the procedure and laterality before marking the site Procedure checklist: Completed Consent: Before the procedure and under the influence of no sedative(s), amnesic(s), or anxiolytics, the patient was informed of the treatment options, risks and possible complications. To fulfill our ethical and legal obligations, as recommended by the American Medical Association's Code of Ethics, I have informed the patient of my clinical impression; the nature and purpose of the treatment or procedure; the risks, benefits, and possible complications of the intervention; the alternatives, including doing nothing; the risk(s) and benefit(s) of the alternative treatment(s) or procedure(s); and the risk(s) and benefit(s) of doing nothing. The patient was provided information about the general risks and possible complications associated with the procedure. These may include, but are not limited to: failure to achieve desired goals, infection, bleeding, organ or nerve damage, allergic reactions, paralysis, and death. In addition, the patient was informed of those risks and complications associated to Spine-related procedures, such as failure to decrease pain; infection (i.e.: Meningitis, epidural or intraspinal abscess); bleeding (i.e.: epidural hematoma, subarachnoid hemorrhage, or any other type of intraspinal or peri-dural bleeding); organ or nerve damage (i.e.: Any type of peripheral nerve, nerve root, or spinal cord injury) with subsequent damage to sensory, motor, and/or autonomic systems, resulting in permanent pain, numbness, and/or weakness of one or several areas of the body; allergic  reactions; (i.e.: anaphylactic reaction); and/or death. Furthermore, the patient was informed of those risks and complications associated with the medications. These include, but are not limited to: allergic reactions (i.e.: anaphylactic or anaphylactoid reaction(s)); adrenal axis suppression; blood sugar elevation that in diabetics may result in ketoacidosis or comma; water retention that in patients with history of congestive heart failure may result in shortness of breath, pulmonary edema, and decompensation with resultant heart failure; weight gain; swelling or edema; medication-induced neural toxicity; particulate matter embolism and blood vessel occlusion with resultant organ, and/or nervous system infarction; and/or aseptic necrosis of one or more joints. Finally, the patient was informed that Medicine is not an exact science; therefore, there is also the possibility of unforeseen or unpredictable risks and/or possible complications that may result in a catastrophic outcome. The patient indicated having understood very clearly. We have given the patient no guarantees and we have made no promises. Enough time was given to the patient to ask questions, all of which were answered to the patient's satisfaction. Shannon Benitez has indicated that she wanted to continue with the procedure. Attestation: I, the ordering provider, attest that I have discussed with the patient the benefits, risks, side-effects, alternatives, likelihood of achieving goals, and potential problems during recovery for the procedure that I have provided informed consent. Date   Time: 05/24/2020 10:36 AM  Pre-Procedure Preparation:  Monitoring: As per clinic protocol. Respiration, ETCO2, SpO2, BP, heart rate and rhythm monitor placed and checked for adequate function Safety Precautions: Patient was assessed for positional comfort and pressure points before starting the procedure. Time-out: I initiated and conducted the "Time-out"  before starting the procedure, as per protocol. The patient was asked to participate by confirming the accuracy of the "Time Out" information. Verification of the correct person, site, and procedure were performed and confirmed by me, the nursing staff, and the patient. "Time-out" conducted as per Joint Commission's Universal Protocol (UP.01.01.01). Time: 1128  Description of Procedure:          Laterality: Left Levels:  L2, L3, L4, L5, & S1 Medial Branch Level(s), at the L3-4, L4-5, and the L5-S1 lumbar facet joints. Area Prepped:  Lumbosacral DuraPrep (Iodine Povacrylex [0.7% available iodine] and Isopropyl Alcohol, 74% w/w) Safety Precautions: Aspiration looking for blood return was conducted prior to all injections. At no point did we inject any substances, as a needle was being advanced. Before injecting, the patient was told to immediately notify me if she was experiencing any new onset of "ringing in the ears, or metallic taste in the mouth". No attempts were made at seeking any paresthesias. Safe injection practices and needle disposal techniques used. Medications properly checked for expiration dates. SDV (single dose vial) medications used. After the completion of the procedure, all disposable equipment used was discarded in the proper designated medical waste containers. Local Anesthesia: Protocol guidelines were followed. The patient was positioned over the fluoroscopy table. The area was prepped in the usual manner. The time-out was completed. The target area was identified using fluoroscopy. A 12-in long, straight, sterile hemostat was used with fluoroscopic guidance to locate the targets for each level blocked. Once located, the skin was marked with an approved surgical skin marker. Once all sites were marked, the skin (epidermis, dermis, and hypodermis), as well as deeper tissues (fat, connective tissue and muscle) were infiltrated with a small amount of a short-acting local anesthetic,  loaded on a 10cc syringe with a 25G, 1.5-in  Needle. An appropriate amount of time was allowed for local anesthetics to take effect before proceeding to the next step. Local Anesthetic: Lidocaine 2.0% The unused portion of the local anesthetic was discarded in the proper designated containers. Technical explanation of process:  Radiofrequency Ablation (RFA) L2 Medial Branch Nerve RFA: The target area for the L2 medial branch is at the junction of the postero-lateral aspect of the superior articular process and the superior, posterior, and medial edge of the transverse process of L3. Under fluoroscopic guidance, a Radiofrequency needle was inserted until contact was made with os over the superior postero-lateral aspect of the pedicular shadow (target area). Sensory and motor testing was conducted to properly adjust the position of the needle. Once satisfactory placement of the needle was achieved, the numbing solution was slowly injected after negative aspiration for blood. 2.0 mL of the nerve block solution was injected without difficulty or complication. After waiting for at least 3 minutes, the ablation was performed. Once completed, the needle was removed intact. L3 Medial Branch Nerve RFA: The target area for the L3 medial branch is at the junction of the postero-lateral aspect of the superior articular process and the superior, posterior, and medial edge of the transverse process of L4. Under fluoroscopic guidance, a Radiofrequency needle was inserted until contact was made with os over the superior postero-lateral aspect of the pedicular shadow (target area). Sensory and motor testing was conducted to properly adjust the position of the needle. Once satisfactory placement of the needle was achieved, the numbing solution was slowly injected after negative aspiration for blood. 2.0 mL of the nerve block solution was injected without difficulty or complication. After waiting for at least 3 minutes, the  ablation was performed. Once completed, the needle was removed intact. L4 Medial Branch Nerve RFA: The target area for the L4 medial branch is at the junction of the postero-lateral aspect of the superior articular process and the superior, posterior, and medial edge of the transverse process of L5. Under fluoroscopic guidance, a Radiofrequency needle was inserted until contact was made with os over the superior postero-lateral aspect of the pedicular shadow (target area). Sensory and motor testing was conducted to properly adjust the position of  the needle. Once satisfactory placement of the needle was achieved, the numbing solution was slowly injected after negative aspiration for blood. 2.0 mL of the nerve block solution was injected without difficulty or complication. After waiting for at least 3 minutes, the ablation was performed. Once completed, the needle was removed intact. L5 Medial Branch Nerve RFA: The target area for the L5 medial branch is at the junction of the postero-lateral aspect of the superior articular process of S1 and the superior, posterior, and medial edge of the sacral ala. Under fluoroscopic guidance, a Radiofrequency needle was inserted until contact was made with os over the superior postero-lateral aspect of the pedicular shadow (target area). Sensory and motor testing was conducted to properly adjust the position of the needle. Once satisfactory placement of the needle was achieved, the numbing solution was slowly injected after negative aspiration for blood. 2.0 mL of the nerve block solution was injected without difficulty or complication. After waiting for at least 3 minutes, the ablation was performed. Once completed, the needle was removed intact. S1 Medial Branch Nerve RFA: The target area for the S1 medial branch is located inferior to the junction of the S1 superior articular process and the L5 inferior articular process, posterior, inferior, and lateral to the 6 o'clock  position of the L5-S1 facet joint, just superior to the S1 posterior foramen. Under fluoroscopic guidance, the Radiofrequency needle was advanced until contact was made with os over the Target area. Sensory and motor testing was conducted to properly adjust the position of the needle. Once satisfactory placement of the needle was achieved, the numbing solution was slowly injected after negative aspiration for blood. 2.0 mL of the nerve block solution was injected without difficulty or complication. After waiting for at least 3 minutes, the ablation was performed. Once completed, the needle was removed intact. Radiofrequency lesioning (ablation):  Radiofrequency Generator: NeuroTherm [id number] Sensory Stimulation Parameters: 50 Hz was used to locate & identify the nerve, making sure that the needle was positioned such that there was no sensory stimulation below 0.3 V or above 0.7 V. Motor Stimulation Parameters: 2 Hz was used to evaluate the motor component. Care was taken not to lesion any nerves that demonstrated motor stimulation of the lower extremities at an output of less than 2.5 times that of the sensory threshold, or a maximum of 2.0 V. Lesioning Technique Parameters: Standard Radiofrequency settings. (Not bipolar or pulsed.) Temperature Settings: 80 degrees C Lesioning time: 60 seconds Intra-operative Compliance: Compliant Materials & Medications: Needle(s) (Electrode/Cannula) Type: Teflon-coated, curved tip, Radiofrequency needle(s) Gauge: 22G Length: 10cm Numbing solution: 0.2% PF-Ropivacaine + Triamcinolone (40 mg/mL) diluted to a final concentration of 4 mg of Triamcinolone/mL of Ropivacaine The unused portion of the solution was discarded in the proper designated containers.  Once the entire procedure was completed, the treated area was cleaned, making sure to leave some of the prepping solution back to take advantage of its long term bactericidal properties.  Illustration of the  posterior view of the lumbar spine and the posterior neural structures. Laminae of L2 through S1 are labeled. DPRL5, dorsal primary ramus of L5; DPRS1, dorsal primary ramus of S1; DPR3, dorsal primary ramus of L3; FJ, facet (zygapophyseal) joint L3-L4; I, inferior articular process of L4; LB1, lateral branch of dorsal primary ramus of L1; IAB, inferior articular branches from L3 medial branch (supplies L4-L5 facet joint); IBP, intermediate branch plexus; MB3, medial branch of dorsal primary ramus of L3; NR3, third lumbar nerve root; S, superior articular process  of L5; SAB, superior articular branches from L4 (supplies L4-5 facet joint also); TP3, transverse process of L3.  Vitals:   05/24/20 1207 05/24/20 1217 05/24/20 1228 05/24/20 1238  BP: 108/78 (!) 103/58 (!) 99/59 (!) 103/51  Pulse:      Resp: 12 16 18 18   Temp:    98 F (36.7 C)  TempSrc:      SpO2: 99% 100% 100% 100%  Weight:      Height:       Start Time: 1128 hrs. End Time: 1207 hrs.  Imaging Guidance (Spinal):          Type of Imaging Technique: Fluoroscopy Guidance (Spinal) Indication(s): Assistance in needle guidance and placement for procedures requiring needle placement in or near specific anatomical locations not easily accessible without such assistance. Exposure Time: Please see nurses notes. Contrast: None used. Fluoroscopic Guidance: I was personally present during the use of fluoroscopy. "Tunnel Vision Technique" used to obtain the best possible view of the target area. Parallax error corrected before commencing the procedure. "Direction-depth-direction" technique used to introduce the needle under continuous pulsed fluoroscopy. Once target was reached, antero-posterior, oblique, and lateral fluoroscopic projection used confirm needle placement in all planes. Images permanently stored in EMR. Interpretation: No contrast injected. I personally interpreted the imaging intraoperatively. Adequate needle placement confirmed in  multiple planes. Permanent images saved into the patient's record.  Antibiotic Prophylaxis:   Anti-infectives (From admission, onward)   None     Indication(s): None identified  Post-operative Assessment:  Post-procedure Vital Signs:  Pulse/HCG Rate: 7366 Temp: 98 F (36.7 C) Resp: 18 BP: (!) 103/51 SpO2: 100 %  EBL: None  Complications: No immediate post-treatment complications observed by team, or reported by patient.  Note: The patient tolerated the entire procedure well. A repeat set of vitals were taken after the procedure and the patient was kept under observation following institutional policy, for this type of procedure. Post-procedural neurological assessment was performed, showing return to baseline, prior to discharge. The patient was provided with post-procedure discharge instructions, including a section on how to identify potential problems. Should any problems arise concerning this procedure, the patient was given instructions to immediately contact us, at any time, without hesitation. In any case, we plan to contact the patient by telephone for a follow-up status report regarding this interventional procedure.  Comments:  No additional relevant information.  Plan of Care  Orders:  Orders Placed This Encounter  Procedures   Radiofrequency,Lumbar    Scheduling Instructions:     Side(s): Left-sided     Level: L3-4, L4-5, & L5-S1 Facets (L2, L3, L4, L5, & S1 Medial Branch Nerves)     Sedation: With Sedation.     Timeframe: Today    Order Specific Question:   Where will this procedure be performed?    Answer:   [facility]   DG [facility] C-ARM 1-60 MIN NO REPORT    Intraoperative interpretation by procedural physician at [place].    Standing Status:   Standing    Number of Occurrences:   1    Order Specific Question:   Reason for exam:    Answer:   Assistance in needle guidance and placement for procedures requiring needle placement  in or near specific anatomical locations not easily accessible without such assistance.   Informed Consent Details: Physician/Practitioner Attestation; Transcribe to consent form and obtain patient signature    Nursing Order: Transcribe to consent form and obtain patient signature. Note: Always confirm  laterality of pain with Ms. Eng, before procedure. Procedure: Lumbar Facet Radiofrequency Ablation Indication/Reason: Low Back Pain, with our without leg pain, due to Facet Joint Arthralgia (Joint Pain) known as Lumbar Facet Syndrome, secondary to Lumbar, and/or Lumbosacral Spondylosis (Arthritis of the Spine), without myelopathy or radiculopathy (Nerve Damage). Provider Attestation: I, [place] Dossie Arbour, MD, (Pain Management Specialist), the physician/practitioner, attest that I have discussed with the patient the benefits, risks, side effects, alternatives, likelihood of achieving goals and potential problems during recovery for the procedure that I have provided informed consent.   Provide equipment / supplies at bedside    Equipment required: Sterile "Radiofrequency Tray"; Large hemostat (1); Small hemostat (1); Towels (6-8); 4x4 sterile sponge pack (1) Radiofrequency Needle(s): Size: Long Quantity: 5    Standing Status:   Standing    Number of Occurrences:   1    Order Specific Question:   Specify    Answer:   Radiofrequency Tray   Chronic Opioid Analgesic:  Oxycodone IR 5 mg, 1 tab PO BID (10 mg/day of oxycodone)  MME/day:15mg /day.   Medications ordered for procedure: Meds ordered this encounter  Medications   lidocaine (XYLOCAINE) 2 % (with pres) injection 400 mg   lactated ringers infusion 1,000 mL   midazolam (VERSED) 5 MG/5ML injection 1-2 mg    Make sure Flumazenil is available in the pyxis when using this medication. If oversedation occurs, administer 0.2 mg IV over 15 sec. If after 45 sec no response, administer 0.2 mg again over 1 min; may repeat at 1 min  intervals; not to exceed 4 doses (1 mg)   fentaNYL (SUBLIMAZE) injection 25-50 mcg    Make sure Narcan is available in the pyxis when using this medication. In the event of respiratory depression (RR< 8/min): Titrate NARCAN (naloxone) in increments of 0.1 to 0.2 mg IV at 2-3 minute intervals, until desired degree of reversal.   triamcinolone acetonide (KENALOG-40) injection 40 mg   ropivacaine (PF) 2 mg/mL (0.2%) (NAROPIN) injection 9 mL   HYDROcodone-acetaminophen (NORCO/VICODIN) 5-325 MG tablet    Sig: Take 1 tablet by mouth every 6 (six) hours as needed for up to 7 days for severe pain. Must last 7 days.    Dispense:  28 tablet    Refill:  0    For acute post-operative pain. Not to be refilled. Must last 7 days.   HYDROcodone-acetaminophen (NORCO/VICODIN) 5-325 MG tablet    Sig: Take 1 tablet by mouth every 6 (six) hours as needed for up to 7 days for severe pain. Must last 7 days.    Dispense:  28 tablet    Refill:  0    For acute post-operative pain. Not to be refilled.  Must last 7 days.   Medications administered: We administered lidocaine, lactated ringers, midazolam, fentaNYL, triamcinolone acetonide, and ropivacaine (PF) 2 mg/mL (0.2%).  See the medical record for exact dosing, route, and time of administration.  Follow-up plan:   Return in about 6 weeks (around 07/05/2020) for 15-min, VV, PP-e/m (PM on procedure day).       Interventional management options: Considering:   Continue encouraging the patient to bring her BMI below 35. Diagnostic bilateral T11-12 lumbar facet block   Diagnostic right T7-8 TESI   Diagnostic right T7 TFESI   Diagnostic ML CESI     Palliative PRN treatment(s):   Palliative bilateral lumbar facet block #4  Palliative left lumbar facet RFA #4 (last done on 05/24/2020)  Palliative right lumbar facet RFA #4 (last done  on 04/07/2020)     Recent Visits Date Type Provider Dept  04/20/20 Telemedicine Milinda Pointer, MD [facility]    04/07/20 Procedure visit Milinda Pointer, MD [facility]  03/03/20 Procedure visit Milinda Pointer, MD [facility]  Showing recent visits within past 90 days and meeting all other requirements Today's Visits Date Type Provider Dept  05/24/20 Procedure visit Milinda Pointer, MD [facility]  Showing today's visits and meeting all other requirements Future Appointments Date Type Provider Dept  07/05/20 Appointment Milinda Pointer, MD [facility]  08/10/20 Appointment Milinda Pointer, MD [facility]  Showing future appointments within next 90 days and meeting all other requirements  Disposition: Discharge home  Discharge (Date   Time): 05/24/2020; 1237 hrs.   Primary Care Physician: Marygrace Drought, MD Location: [facility] Note by: [name], MD Date: 05/24/2020; Time: 1:00 PM  Disclaimer:  Medicine is not an Chief Strategy Officer. The only guarantee in medicine is that nothing is guaranteed. It is important to note that the decision to proceed with this intervention was based on the information collected from the patient. The Data and conclusions were drawn from the patient's questionnaire, the interview, and the physical examination. Because the information was provided in large part by the patient, it cannot be guaranteed that it has not been purposely or unconsciously manipulated. Every effort has been made to obtain as much relevant data as possible for this evaluation. It is important to note that the conclusions that lead to this procedure are derived in large part from the available data. Always take into account that the treatment will also be dependent on availability of resources and existing treatment guidelines, considered by other Pain Management Practitioners as being common knowledge and practice, at the time of the intervention. For Medico-Legal purposes, it is also important to point  out that variation in procedural techniques and pharmacological choices are the acceptable norm. The indications, contraindications, technique, and results of the above procedure should only be interpreted and judged by a Board-Certified Interventional Pain Specialist with extensive familiarity and expertise in the same exact procedure and technique.

## 2020-05-25 ENCOUNTER — Telehealth: Payer: Self-pay

## 2020-05-25 NOTE — Telephone Encounter (Signed)
Post procedure phone call.  Patient states she is doing ok but is sore.  Instructed to use heat today and to call us for any further questions or concerns.

## 2020-06-09 ENCOUNTER — Telehealth: Payer: Self-pay | Admitting: Pain Medicine

## 2020-06-09 NOTE — Telephone Encounter (Signed)
Patient fell on Tues. States she is now having the same pain she had before RF. Wants to speak with someone on what she can do to help with pain? Please Call

## 2020-06-10 ENCOUNTER — Telehealth: Payer: Self-pay

## 2020-06-10 NOTE — Telephone Encounter (Signed)
Spoke with patient and she states she fell and landed on her face. States her ribs and back are hurting.  Denies seeking treatment after the fall.  Instructed patient that she should seek treatment to see if there are any new issues with ribs or back.  States she has broken ribs with last fall and know how it feels.  Patient had RFA on 05-24-2020 and states her back is hurting just like before the procedure.  States "it is back to square 1."   Instructed patient that she needed to wait the 6 weeks to see if the [place] worked.  Informed her to see her PCP or go to ED to follow up with fall.

## 2020-07-05 ENCOUNTER — Telehealth: Payer: Medicaid Other | Admitting: Pain Medicine

## 2020-07-12 ENCOUNTER — Ambulatory Visit: Payer: Medicaid Other | Admitting: Pain Medicine

## 2020-07-14 ENCOUNTER — Encounter: Payer: Self-pay | Admitting: Pain Medicine

## 2020-07-17 NOTE — Patient Instructions (Addendum)
____________________________________________________________________________________________  Preparing for Procedure with Sedation  Procedure appointments are limited to planned procedures: . No Prescription Refills. . No disability issues will be discussed. . No medication changes will be discussed.  Instructions: . Oral Intake: Do not eat or drink anything for at least 8 hours prior to your procedure. (Exception: Blood Pressure Medication. See below.) . Transportation: Unless otherwise stated by your physician, you may drive yourself after the procedure. . Blood Pressure Medicine: Do not forget to take your blood pressure medicine with a sip of water the morning of the procedure. If your Diastolic (lower reading)is above 100 mmHg, elective cases will be cancelled/rescheduled. . Blood thinners: These will need to be stopped for procedures. Notify our staff if you are taking any blood thinners. Depending on which one you take, there will be specific instructions on how and when to stop it. . Diabetics on insulin: Notify the staff so that you can be scheduled 1st case in the morning. If your diabetes requires high dose insulin, take only  of your normal insulin dose the morning of the procedure and notify the staff that you have done so. . Preventing infections: Shower with an antibacterial soap the morning of your procedure. . Build-up your immune system: Take 1000 mg of Vitamin C with every meal (3 times a day) the day prior to your procedure. . Antibiotics: Inform the staff if you have a condition or reason that requires you to take antibiotics before dental procedures. . Pregnancy: If you are pregnant, call and cancel the procedure. . Sickness: If you have a cold, fever, or any active infections, call and cancel the procedure. . Arrival: You must be in the facility at least 30 minutes prior to your scheduled procedure. . Children: Do not bring children with you. . Dress appropriately:  Bring dark clothing that you would not mind if they get stained. . Valuables: Do not bring any jewelry or valuables.  Reasons to call and reschedule or cancel your procedure: (Following these recommendations will minimize the risk of a serious complication.) . Surgeries: Avoid having procedures within 2 weeks of any surgery. (Avoid for 2 weeks before or after any surgery). . Flu Shots: Avoid having procedures within 2 weeks of a flu shots or . (Avoid for 2 weeks before or after immunizations). . Barium: Avoid having a procedure within 7-10 days after having had a radiological study involving the use of radiological contrast. (Myelograms, Barium swallow or enema study). . Heart attacks: Avoid any elective procedures or surgeries for the initial 6 months after a "Myocardial Infarction" (Heart Attack). . Blood thinners: It is imperative that you stop these medications before procedures. Let us know if you if you take any blood thinner.  . Infection: Avoid procedures during or within two weeks of an infection (including chest colds or gastrointestinal problems). Symptoms associated with infections include: Localized redness, fever, chills, night sweats or profuse sweating, burning sensation when voiding, cough, congestion, stuffiness, runny nose, sore throat, diarrhea, nausea, vomiting, cold or Flu symptoms, recent or current infections. It is specially important if the infection is over the area that we intend to treat. . Heart and lung problems: Symptoms that may suggest an active cardiopulmonary problem include: cough, chest pain, breathing difficulties or shortness of breath, dizziness, ankle swelling, uncontrolled high or unusually low blood pressure, and/or palpitations. If you are experiencing any of these symptoms, cancel your procedure and contact your primary care physician for an evaluation.  Remember:  Regular Business hours are:    Monday to Thursday 8:00 AM to 4:00 PM  Provider's  Schedule: Jadyn Brasher, MD:  Procedure days: Tuesday and Thursday 7:30 AM to 4:00 PM  Bilal Lateef, MD:  Procedure days: Monday and Wednesday 7:30 AM to 4:00 PM ____________________________________________________________________________________________    

## 2020-07-17 NOTE — Progress Notes (Signed)
Patient: Shannon Benitez  Service Category: E/M  Provider: [name], MD  DOB: [birth year]  DOS: 07/18/2020  Location: Office  MRN: [id number]  Setting: Ambulatory outpatient  Referring Provider: Marygrace Drought, MD  Type: Established Patient  Specialty: Interventional Pain Management  PCP: Marygrace Drought, MD  Location: Remote location  Delivery: TeleHealth     Virtual Encounter - Pain Management PROVIDER NOTE: Information contained herein reflects review and annotations entered in association with encounter. Interpretation of such information and data should be left to medically-trained personnel. Information provided to patient can be located elsewhere in the medical record under "Patient Instructions". Document created using STT-dictation technology, any transcriptional errors that may result from process are unintentional.    Contact & Pharmacy Preferred: [phone] Home: [phone] (home) Mobile: [phone] (mobile) E-mail: [email or web address], [place] [address] [place] [address] Phone: [phone] Fax: [phone]  [id number]: [phone]: [phone]  Pre-screening  Ms. Terrero offered "in-person" vs "virtual" encounter. She indicated preferring virtual for this encounter.   Reason COVID-19*  Social distancing based on CDC and AMA recommendations.   I contacted Jesiah Kienast on 07/18/2020 via telephone.      I clearly identified myself as FGaspar Cola MD. I verified that I was speaking with the correct person using two identifiers (Name: EDaanya Lanphier and date of birth: 8April 08, 1972.  Consent I sought verbal advanced consent [facility]. I informed Ms. Ferrera of possible security and privacy concerns, risks, and limitations associated with providing "not-in-person"  medical evaluation and management services. I also informed Ms. Foulks of the availability of "in-person" appointments. Finally, I informed her that there would be a charge for the virtual visit and that she could be  personally, fully or partially, financially responsible for it. Ms. WVitelliexpressed understanding and agreed to proceed.   Historic Elements   Ms. EKennita Pavlovichis a 49y.o. year old,  female patient evaluated today after our last contact on 06/09/2020. Ms. WBrickell has a past medical history of Acute postoperative pain (04/30/2019), Diabetes mellitus without complication (HHiram, GERD (gastroesophageal reflux disease), Headache, MS (multiple sclerosis) (HF[place], Numbness and tingling of both legs below knees, Osteoarthritis, Vertigo, and Wears dentures. She also  has a past surgical history that includes Abdominal hysterectomy; Cesarean section; Tumor removal (Left); Cataract extraction w/PHACO (Right, 03/18/2019); and Cataract extraction w/PHACO (Left, 04/13/2019). Ms. WPickerelhas a current medication list which includes the following prescription(s): aspirin-acetaminophen-caffeine, baclofen, dimethyl fumarate, duloxetine, lamotrigine, levetiracetam, loperamide, meloxicam, mirabegron er, oxycodone, pregabalin, propranolol, metformin, omeprazole, primidone, and sitagliptin. She  reports that she quit smoking about 3 years ago. Her smoking use included cigarettes. She has a 9.00 pack-year smoking history. She has quit using smokeless tobacco. She reports that she does not drink alcohol. No history on file for drug use. Ms. WTitsworthis allergic to 5-alpha reductase inhibitors, glatiramer, rebif [interferon beta-1a], and latex.   HPI  Today, she is being contacted for a post-procedure assessment.  The patient was scheduled to return today for evaluation after having had a left-sided lumbar facet radiofrequency ablation under fluoroscopic guidance and IV sedation.  Unfortunately, 2  weeks after the procedure she fell and aggravated her back and it has not gotten any better since.  Specifically the patient indicates having bilateral low back pain with the left being worse than  the right.  She also has bilateral lower extremity pain, again with the left being worse than the right.  In the case of the left lower extremity pain this goes down to the ankle through the back of the leg, not turning.  In the case of the right lower extremity pain this goes down to the knee, again through the back of the leg.  Today I asked the patient to perform some provocative testing Saralyn Pilar maneuver).  This exactly reproduced the patient's low back pain suggesting bilateral sacroiliac joint arthralgia.  The patient indicates having had a fall around September 1 and it was this fall that aggravated all of her pains.  She indicates today that she "knew that she had not broken anything" and therefore she did not go to the emergency room or seek any help, until now.  Based on the information gathered today both on the patient's history and provocative physical exam, I will be scheduling her to return for further physical examination and a possible diagnostic bilateral sacroiliac joint block under fluoroscopic guidance and IV sedation.  Post-Procedure Evaluation  Procedure (06/09/2020): Therapeutic left lumbar facet RFA #3 under fluoroscopic guidance and IV sedation Pre-procedure pain level: 10/10 Post-procedure: 0/10 (100% relief)  Sedation: Sedation provided.  Effectiveness during initial hour after procedure(Ultra-Short Term Relief): 100 %.  Local anesthetic used: Long-acting (4-6 hours) Effectiveness: Defined as any analgesic benefit obtained secondary to the administration of local anesthetics. This carries significant diagnostic value as to the etiological location, or anatomical origin, of the pain. Duration of benefit is expected to coincide with the duration of the local anesthetic used.   Effectiveness during initial 4-6 hours after procedure(Short-Term Relief): 100 %.  Long-term benefit: Defined as any relief past the pharmacologic duration of the local anesthetics.  Effectiveness past the initial 6 hours after procedure(Long-Term Relief): 0 % (fell approx 2 weeks post procedure and patient denies any relief from RFA after that.).  Current benefits: Defined as benefit that persist at this time.   Analgesia:  Back to baseline.  Unfortunately this was not a failure of the radiofrequency ablation but the fact that the patient indicated having had a fall approximately 2 weeks after having had her radiofrequency ablation and after that point, she indicates worsening of her low back pain with no subsequent improvement. Function: Back to baseline ROM: Back to baseline  Pharmacotherapy Assessment  Analgesic: Oxycodone IR 5 mg, 1 tab PO BID (10 mg/day of oxycodone)  MME/day:4m/day.   Monitoring: [place] PMP: PDMP reviewed during this encounter.       Pharmacotherapy: No side-effects or adverse reactions reported. Compliance: No problems identified. Effectiveness: Clinically acceptable. Plan: Refer to "POC".  UDS:  Summary  Date Value Ref Range Status  11/27/2018 FINAL  Final    Comment:    ==================================================================== [facility] COMP DRUG ANALYSIS,UR ==================================================================== Test                             Result       Flag       Units Drug Present and Declared for Prescription Verification   7-aminoclonazepam              319          EXPECTED   ng/mg creat    7-aminoclonazepam is an expected metabolite of clonazepam. Source    of clonazepam is a scheduled prescription medication.   Primidone  PRESENT      EXPECTED   Phenobarbital                  PRESENT      EXPECTED    Phenobarbital is an expected metabolite of primidone;    Phenobarbital may also be administered  as a prescription drug.   Gabapentin                     PRESENT      EXPECTED   Lamotrigine                    PRESENT      EXPECTED   Baclofen                       PRESENT      EXPECTED   Duloxetine                     PRESENT      EXPECTED   Propranolol                    PRESENT      EXPECTED Drug Present not Declared for Prescription Verification   Metaxalone                     PRESENT      UNEXPECTED   Acetaminophen                  PRESENT      UNEXPECTED   Diphenhydramine                PRESENT      UNEXPECTED ==================================================================== Test                      Result    Flag   Units      Ref Range   Creatinine              47               mg/dL      >=20 ==================================================================== Declared Medications:  The flagging and interpretation on this report are based on the  following declared medications.  Unexpected results may arise from  inaccuracies in the declared medications.  **Note: The testing scope of this panel includes these medications:  Baclofen (Lioresal)  Clonazepam (Klonopin)  Duloxetine (Cymbalta)  Gabapentin (Neurontin)  Lamotrigine (Lamictal)  Primidone (Mysoline)  Propranolol (Inderal)  **Note: The testing scope of this panel does not include following  reported medications:  Dimethyl Fumarate  Metformin (Glucophage)  Mirabegron (Myrbetriq)  Omeprazole (Prilosec)  Sitagliptin (Januvia) ==================================================================== For clinical consultation, please call [phone]. ====================================================================     Laboratory Chemistry Profile   Renal Lab Results  Component Value Date   BUN 13 09/25/2019   CREATININE 0.53 09/25/2019   BCR 13 11/27/2018   GFRAA >60 09/25/2019   GFRNONAA >60 09/25/2019     Hepatic Lab Results  Component Value Date   AST 17 11/27/2018   ALT 35 08/18/2018    ALBUMIN 4.1 11/27/2018   ALKPHOS 91 11/27/2018     Electrolytes Lab Results  Component Value Date   NA 136 09/25/2019   K 3.8 09/25/2019   CL 101 09/25/2019   CALCIUM 8.8 (L) 09/25/2019   MG 2.2 11/27/2018     Bone Lab Results  Component Value Date   25OHVITD1 55 11/27/2018  25OHVITD2 <1.0 11/27/2018   25OHVITD3 55 11/27/2018     Inflammation (CRP: Acute Phase) (ESR: Chronic Phase) Lab Results  Component Value Date   CRP 10 11/27/2018   ESRSEDRATE 42 (H) 11/27/2018       Note: Above Lab results reviewed.  Imaging  DG PAIN CLINIC C-ARM 1-60 MIN NO REPORT Fluoro was used, but no Radiologist interpretation will be provided.  Please refer to "NOTES" tab for provider progress note.  Assessment  The primary encounter diagnosis was Chronic pain syndrome. 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  Plan of Care  Problem-specific:  No problem-specific Assessment & Plan notes found for this encounter.  Ms. Blue Ruggerio has a current medication list which includes the following long-term medication(s): baclofen, duloxetine, lamotrigine, levetiracetam, meloxicam, oxycodone, pregabalin, propranolol, metformin, omeprazole, primidone, and sitagliptin.  Pharmacotherapy (Medications Ordered): No orders of the defined types were placed in this encounter.  Orders:  Orders Placed This Encounter  Procedures  . SACROILIAC JOINT INJECTION    Standing Status:   Future    Standing Expiration Date:   08/17/2020    Scheduling Instructions:     Side: Bilateral     Sedation: Patient's choice.     Timeframe: ASAP    Order Specific Question:   Where will this procedure be performed?    Answer:   [facility]   Follow-up plan:   Return for Procedure (w/ sedation): (B) SI BLK #1.      Interventional  management options: Considering:   Continue encouraging the patient to bring her BMI below 35. Diagnostic bilateral T11-12 lumbar facet block   Diagnostic right T7-8 TESI   Diagnostic right T7 TFESI   Diagnostic ML CESI   Possible candidate for intrathecal pump trial and implant    Palliative PRN treatment(s):   Palliative bilateral lumbar facet block #4  Palliative left lumbar facet RFA #4 (last done on 05/24/2020)  Palliative right lumbar facet RFA #4 (last done on 04/07/2020)     Recent Visits Date Type Provider Dept  05/24/20 Procedure visit Milinda Pointer, MD [facility]  04/20/20 Telemedicine Milinda Pointer, MD [facility]  Showing recent visits within past 90 days and meeting all other requirements Today's Visits Date Type Provider Dept  07/18/20 Telemedicine Milinda Pointer, MD [facility]  Showing today's visits and meeting all other requirements Future Appointments Date Type Provider Dept  08/10/20 Appointment Milinda Pointer, MD [facility]  Showing future appointments within next 90 days and meeting all other requirements  I discussed the assessment and treatment plan with the patient. The patient was provided an opportunity to ask questions and all were answered. The patient agreed with the plan and demonstrated an understanding of the instructions.  Patient advised to call back or seek an in-person evaluation if the symptoms or condition worsens.  Duration of encounter: 15 minutes.  Note by: [name], MD Date: 07/18/2020; Time: 4:49 PM

## 2020-07-18 ENCOUNTER — Other Ambulatory Visit: Payer: Self-pay

## 2020-07-18 ENCOUNTER — Ambulatory Visit: Payer: Medicaid Other | Attending: Pain Medicine | Admitting: Pain Medicine

## 2020-07-18 DIAGNOSIS — M5442 Lumbago with sciatica, left side: Secondary | ICD-10-CM

## 2020-07-18 DIAGNOSIS — G894 Chronic pain syndrome: Secondary | ICD-10-CM

## 2020-07-18 DIAGNOSIS — M533 Sacrococcygeal disorders, not elsewhere classified: Secondary | ICD-10-CM | POA: Diagnosis not present

## 2020-07-18 DIAGNOSIS — G35 Multiple sclerosis: Secondary | ICD-10-CM

## 2020-07-18 DIAGNOSIS — M5441 Lumbago with sciatica, right side: Secondary | ICD-10-CM

## 2020-07-18 DIAGNOSIS — G8929 Other chronic pain: Secondary | ICD-10-CM

## 2020-08-09 NOTE — Progress Notes (Signed)
PROVIDER NOTE: Information contained herein reflects review and annotations entered in association with encounter. Interpretation of such information and data should be left to medically-trained personnel. Information provided to patient can be located elsewhere in the medical record under "Patient Instructions". Document created using STT-dictation technology, any transcriptional errors that may result from process are unintentional.    Patient: Shannon Benitez  Service Category: E/M  Provider: [name], MD  DOB: 07/08/1971  DOS: 08/10/2020  Specialty: Interventional Pain Management  MRN: [id number]  Setting: Ambulatory outpatient  PCP: Marygrace Drought, MD  Type: Established Patient    Referring Provider: Marygrace Drought, MD  Location: Office  Delivery: Face-to-face     HPI  Ms. Kaytlen Carandang, a 49 y.o. year old female, is here today because of her Chronic pain syndrome [G89.4]. Ms. Harmon primary complain today is Back Pain Last encounter: My last encounter with her was on 06/09/2020. Pertinent problems: Ms. Pennix has Multiple sclerosis ([place]); Neurogenic pain; Chronic sacroiliac joint pain (Bilateral) (L>R); Chronic pain syndrome; Chronic low back pain (1ry area of Pain) (Bilateral) (L>R) w/ sciatica (Bilateral); Chronic lower extremity pain (2ry area of Pain) (Bilateral) (R>L); Chronic low back pain (Bilateral) (L>R) w/o sciatica; Muscle spasticity; Spondylosis without myelopathy or radiculopathy, lumbosacral region; Lumbar facet syndrome (Bilateral) (R>L); Abnormal MRI, lumbar spine (07/02/2017); Lumbar facet hypertrophy (Multilevel) (Bilateral); Osteoarthritis of facet joint of lumbar spine; Osteoarthritis of lumbar spine; DDD (degenerative disc disease), lumbosacral; Lumbar spondylosis; Diabetic peripheral neuropathy ([place]); Acute postoperative pain; 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 Pain Assessment: Severity of Chronic pain is reported as a 10-Worst pain ever/10. Location: Back Left, Right/pt radiaties down both leg to her feet. Onset: More than a month ago. Quality: Aching, Shooting, Stabbing, Constant. Timing: Constant. Modifying factor(s): sitting, meds. Vitals:  height is 5' 1" (1.549 m) and weight is 220 lb (99.8 kg). Her temperature is 97.3 F (36.3 C) (abnormal). Her blood pressure is 113/86 and her pulse is 77. Her oxygen saturation is 99%.   Reason for encounter: medication management.  The patient indicates doing well with the current medication regimen. No adverse reactions or side effects reported to the medications.  She indicates that the pain medication does not seem to be as strong as she needs it.  Unfortunately, with the COVID-19 pandemic, she has gained some significant weight and this has been affecting her adversely.  She has macromastia and she has requested a referral to plastic surgery for possible reductive mammoplasty in order to decrease some of the pressure on her back.  I think that this warrants some evaluation by the plastic surgeon since I do believe that it is probably contributing to the patient's lower back problems.  To help the patient with the weight, today I will be doing a referral to bariatric surgery and also to medical weight management so that they can work on bringing her weight down.  Today I took the time to explain to the patient the mechanism and how her weight is adversely affecting her back problems.  Right now she complains of the pain being primarily in the lower back, bilaterally, with the right being just as bad as the left.  She also has some pain in the area of the tailbone and the PSIS, bilaterally.  She refers having broken the tailbone several years ago.  She is scheduled to come back for a bilateral SI joint  injection on 08/18/2020.  Today's provocative test was  positive for exact reproduction of the patient's pain on hyperextension and rotation maneuver.  She also had a positive bilateral Kemp maneuver.  Patrick maneuver was also positive bilateral for bilateral sacroiliac joint arthralgia.  In view of this, we will go ahead and modify the plan for 08/18/2020 where we will be doing a bilateral sacroiliac joint injection + bilateral lumbar facet block under fluoroscopic guidance and IV sedation.  The patient was also recommended to start taking some over-the-counter magnesium 500 mg p.o. daily to assist with the baclofen.  She refers that even when she takes 20 mg 4 times a day, she still having problems with some lower extremity spasticity and lower back spasms.  I informed patient that I would not feel comfortable increasing this dose any further, but I think it would be appropriate to add some over-the-counter magnesium to it.  The plan was shared with the patient who understood and accepted.  RTCB: 11/14/2020 Nonopioids transferred: Meloxicam (Mobic) 15 mg tablet, 1 tab p.o. daily in a.m. (30/month) (11/14/2020); baclofen (Lioresal) 20 mg tablet, 1 tab p.o. 4 times daily (120/month) (11/14/2020); pregabalin (Lyrica) 150 mg capsule, 1 capsule p.o. 3 times daily (90/month) (11/14/2020).  Pharmacotherapy Assessment   Analgesic: Oxycodone IR 5 mg, 1 tab PO BID (10 mg/day of oxycodone)  MME/day:76m/day.   Monitoring: [place] PMP: PDMP reviewed during this encounter.       Pharmacotherapy: No side-effects or adverse reactions reported. Compliance: No problems identified. Effectiveness: Clinically acceptable.  BChauncey Fischer RN  08/10/2020 11:37 AM  Sign when Signing Visit Nursing Pain Medication Assessment:  Safety precautions to be maintained throughout the outpatient stay will include: orient to surroundings, keep bed in low position, maintain call bell within reach at all times, provide assistance with transfer out of bed and ambulation.  Medication  Inspection Compliance: Pill count conducted under aseptic conditions, in front of the patient. Neither the pills nor the bottle was removed from the patient's sight at any time. Once count was completed pills were immediately returned to the patient in their original bottle.  Medication: Oxycodone IR Pill/Patch Count: 29 of 60 pills remain Pill/Patch Appearance: Markings consistent with prescribed medication Bottle Appearance: Standard pharmacy container. Clearly labeled. Filled Date: 9 / 20 / 21 Last Medication intake:  TodaySafety precautions to be maintained throughout the outpatient stay will include: orient to surroundings, keep bed in low position, maintain call bell within reach at all times, provide assistance with transfer out of bed and ambulation.     UDS:  Summary  Date Value Ref Range Status  11/27/2018 FINAL  Final    Comment:    ==================================================================== [facility] COMP DRUG ANALYSIS,UR ==================================================================== Test                             Result       Flag       Units Drug Present and Declared for Prescription Verification   7-aminoclonazepam              319          EXPECTED   ng/mg creat    7-aminoclonazepam is an expected metabolite of clonazepam. Source    of clonazepam is a scheduled prescription medication.   Primidone                      PRESENT      EXPECTED  Phenobarbital                  PRESENT      EXPECTED    Phenobarbital is an expected metabolite of primidone;    Phenobarbital may also be administered as a prescription drug.   Gabapentin                     PRESENT      EXPECTED   Lamotrigine                    PRESENT      EXPECTED   Baclofen                       PRESENT      EXPECTED   Duloxetine                     PRESENT      EXPECTED   Propranolol                    PRESENT      EXPECTED Drug Present not Declared for Prescription Verification   Metaxalone                      PRESENT      UNEXPECTED   Acetaminophen                  PRESENT      UNEXPECTED   Diphenhydramine                PRESENT      UNEXPECTED ==================================================================== Test                      Result    Flag   Units      Ref Range   Creatinine              47               mg/dL      >=20 ==================================================================== Declared Medications:  The flagging and interpretation on this report are based on the  following declared medications.  Unexpected results may arise from  inaccuracies in the declared medications.  **Note: The testing scope of this panel includes these medications:  Baclofen (Lioresal)  Clonazepam (Klonopin)  Duloxetine (Cymbalta)  Gabapentin (Neurontin)  Lamotrigine (Lamictal)  Primidone (Mysoline)  Propranolol (Inderal)  **Note: The testing scope of this panel does not include following  reported medications:  Dimethyl Fumarate  Metformin (Glucophage)  Mirabegron (Myrbetriq)  Omeprazole (Prilosec)  Sitagliptin (Januvia) ==================================================================== For clinical consultation, please call [phone]. ====================================================================      ROS  Constitutional: Denies any fever or chills Gastrointestinal: No reported hemesis, hematochezia, vomiting, or acute GI distress Musculoskeletal: Denies any acute onset joint swelling, redness, loss of ROM, or weakness Neurological: No reported episodes of acute onset apraxia, aphasia, dysarthria, agnosia, amnesia, paralysis, loss of coordination, or loss of consciousness  Medication Review  DULoxetine, Dimethyl Fumarate, aspirin-acetaminophen-caffeine, baclofen, lamoTRIgine, levETIRAcetam, loperamide, meloxicam, metFORMIN, mirabegron ER, omeprazole, oxyCODONE, pregabalin, primidone, propranolol, and sitaGLIPtin  History Review  Allergy: Ms.  Shrestha is allergic to 5-alpha reductase inhibitors, glatiramer, rebif [interferon beta-1a], and latex. Drug: Ms. Magill  has no history on file for drug use. Alcohol:  reports no history of alcohol use. Tobacco:  reports that she quit smoking about 3 years ago. Her smoking use included cigarettes.  She has a 9.00 pack-year smoking history. She has quit using smokeless tobacco. Social: Ms. Blacketer  reports that she quit smoking about 3 years ago. Her smoking use included cigarettes. She has a 9.00 pack-year smoking history. She has quit using smokeless tobacco. She reports that she does not drink alcohol. Medical:  has a past medical history of Acute postoperative pain (04/30/2019), Diabetes mellitus without complication ([place]), GERD (gastroesophageal reflux disease), Headache, MS (multiple sclerosis) (H[facility]), Numbness and tingling of both legs below knees, Osteoarthritis, Vertigo, and Wears dentures. Surgical: Ms. Mealy  has a past surgical history that includes Abdominal hysterectomy; Cesarean section; Tumor removal (Left); Cataract extraction w/PHACO (Right, 03/18/2019); and Cataract extraction w/PHACO (Left, 04/13/2019). Family: family history is not on file. She was adopted.  Laboratory Chemistry Profile   Renal Lab Results  Component Value Date   BUN 13 09/25/2019   CREATININE 0.53 09/25/2019   BCR 13 11/27/2018   GFRAA >60 09/25/2019   GFRNONAA >60 09/25/2019     Hepatic Lab Results  Component Value Date   AST 17 11/27/2018   ALT 35 08/18/2018   ALBUMIN 4.1 11/27/2018   ALKPHOS 91 11/27/2018     Electrolytes Lab Results  Component Value Date   NA 136 09/25/2019   K 3.8 09/25/2019   CL 101 09/25/2019   CALCIUM 8.8 (L) 09/25/2019   MG 2.2 11/27/2018     Bone Lab Results  Component Value Date   25OHVITD1 55 11/27/2018   25OHVITD2 <1.0 11/27/2018   25OHVITD3 55 11/27/2018     Inflammation (CRP: Acute Phase) (ESR: Chronic Phase) Lab Results  Component  Value Date   CRP 10 11/27/2018   ESRSEDRATE 42 (H) 11/27/2018       Note: Above Lab results reviewed.  Recent Imaging Review  DG PAIN CLINIC C-ARM 1-60 MIN NO REPORT Fluoro was used, but no Radiologist interpretation will be provided.  Please refer to "NOTES" tab for provider progress note. Note: Reviewed        Physical Exam  General appearance: Well nourished, well developed, and well hydrated. In no apparent acute distress Mental status: Alert, oriented x 3 (person, place, & time)       Respiratory: No evidence of acute respiratory distress Eyes: PERLA Vitals: BP 113/86   Pulse 77   Temp (!) 97.3 F (36.3 C)   Ht 5' 1" (1.549 m)   Wt 220 lb (99.8 kg)   SpO2 99%   BMI 41.57 kg/m  BMI: Estimated body mass index is 41.57 kg/m as calculated from the following:   Height as of this encounter: 5' 1" (1.549 m).   Weight as of this encounter: 220 lb (99.8 kg). Ideal: Ideal body weight: 47.8 kg (105 lb 6.1 oz) Adjusted ideal body weight: 68.6 kg (151 lb 3.6 oz)  Assessment   Status Diagnosis  Controlled Controlled Controlled 1. Chronic pain syndrome   2. Chronic low back pain (Bilateral) (L>R) w/o sciatica   3. Chronic lower extremity pain (2ry area of Pain) (Bilateral) (R>L)   4. Neurogenic pain   5. Diabetic peripheral neuropathy ([place])   6. Muscle spasticity   7. Osteoarthritis of lumbar spine   8. Morbid obesity with BMI of 40.0-44.9, adult ([place])   9. Macromastia      Updated Problems: Problem  Macromastia    Plan of Care  Problem-specific:  No problem-specific Assessment & Plan notes found for this encounter.  Ms. Elliannah Wayment has a current medication list which includes the following long-term  medication(s): duloxetine, lamotrigine, levetiracetam, propranolol, [START ON 08/16/2020] baclofen, [START ON 08/16/2020] meloxicam, metformin, omeprazole, [START ON 08/16/2020] oxycodone, [START ON 09/15/2020] oxycodone, [START ON 10/15/2020] oxycodone, [START ON  08/16/2020] pregabalin, primidone, and sitagliptin.  Pharmacotherapy (Medications Ordered): Meds ordered this encounter  Medications  . oxyCODONE (OXY IR/ROXICODONE) 5 MG immediate release tablet    Sig: Take 1 tablet (5 mg total) by mouth every 8 (eight) hours as needed for severe pain. Must last 30 days.    Dispense:  90 tablet    Refill:  0    Chronic Pain: STOP Act (Not applicable) Fill 1 day early if closed on refill date. Avoid benzodiazepines within 8 hours of opioids  . pregabalin (LYRICA) 150 MG capsule    Sig: Take 1 capsule (150 mg total) by mouth 3 (three) times daily.    Dispense:  90 capsule    Refill:  2    Fill one day early if pharmacy is closed on scheduled refill date. May substitute for generic if available.  . baclofen (LIORESAL) 20 MG tablet    Sig: Take 1 tablet (20 mg total) by mouth 4 (four) times daily.    Dispense:  120 tablet    Refill:  2    Fill one day early if pharmacy is closed on scheduled refill date. May substitute for generic if available.  . meloxicam (MOBIC) 15 MG tablet    Sig: Take 1 tablet (15 mg total) by mouth daily.    Dispense:  30 tablet    Refill:  2    Fill one day early if pharmacy is closed on scheduled refill date. May substitute for generic if available.  Marland Kitchen oxyCODONE (OXY IR/ROXICODONE) 5 MG immediate release tablet    Sig: Take 1 tablet (5 mg total) by mouth every 8 (eight) hours as needed for severe pain. Must last 30 days.    Dispense:  90 tablet    Refill:  0    Chronic Pain: STOP Act (Not applicable) Fill 1 day early if closed on refill date. Avoid benzodiazepines within 8 hours of opioids  . oxyCODONE (OXY IR/ROXICODONE) 5 MG immediate release tablet    Sig: Take 1 tablet (5 mg total) by mouth every 8 (eight) hours as needed for severe pain. Must last 30 days.    Dispense:  90 tablet    Refill:  0    Chronic Pain: STOP Act (Not applicable) Fill 1 day early if closed on refill date. Avoid benzodiazepines within 8 hours of  opioids   Orders:  Orders Placed This Encounter  Procedures  . Amb Ref to Medical Weight Management    Referral Priority:   Routine    Referral Type:   Consultation    Referral Reason:   Specialty Services Required    Number of Visits Requested:   1  . Amb Referral to Bariatric Surgery    Referral Priority:   Routine    Referral Type:   Consultation    Referral Reason:   Specialty Services Required    Number of Visits Requested:   1  . Ambulatory referral to Plastic Surgery    Referral Priority:   Routine    Referral Type:   Surgical    Referral Reason:   Specialty Services Required    Requested Specialty:   Plastic Surgery    Number of Visits Requested:   1   Follow-up plan:   Return in about 8 days (around 08/18/2020) for Procedure (w/ sedation): (B)  L-FCT BLK + (B) SI BLK.      Interventional management options: Considering:   Continue encouraging the patient to bring her BMI below 35. Diagnostic bilateral T11-12 lumbar facet block   Diagnostic right T7-8 TESI   Diagnostic right T7 TFESI   Diagnostic ML CESI   Possible candidate for intrathecal pump trial and implant    Palliative PRN treatment(s):   Palliative bilateral lumbar facet block #4  Palliative left lumbar facet RFA #4 (last done on 05/24/2020)  Palliative right lumbar facet RFA #4 (last done on 04/07/2020)     Recent Visits Date Type Provider Dept  07/18/20 Telemedicine Milinda Pointer, [facility]  05/24/20 Procedure visit Milinda Pointer, MD [facility]  Showing recent visits within past 90 days and meeting all other requirements Today's Visits Date Type Provider Dept  08/10/20 Office Visit Milinda Pointer, MD [facility]  Showing today's visits and meeting all other requirements Future Appointments Date Type Provider Dept  08/18/20 Appointment Milinda Pointer, MD [facility]  Showing future appointments within next 90 days and meeting all other  requirements  I discussed the assessment and treatment plan with the patient. The patient was provided an opportunity to ask questions and all were answered. The patient agreed with the plan and demonstrated an understanding of the instructions.  Patient advised to call back or seek an in-person evaluation if the symptoms or condition worsens.  Duration of encounter: 35 minutes.  Note by: [name], MD Date: 08/10/2020; Time: 12:30 PM

## 2020-08-10 ENCOUNTER — Encounter: Payer: Self-pay | Admitting: Pain Medicine

## 2020-08-10 ENCOUNTER — Other Ambulatory Visit: Payer: Self-pay

## 2020-08-10 ENCOUNTER — Ambulatory Visit: Payer: Medicaid Other | Attending: Pain Medicine | Admitting: Pain Medicine

## 2020-08-10 VITALS — BP 113/86 | HR 77 | Temp 97.3°F | Ht 61.0 in | Wt 220.0 lb

## 2020-08-10 DIAGNOSIS — M79605 Pain in left leg: Secondary | ICD-10-CM | POA: Insufficient documentation

## 2020-08-10 DIAGNOSIS — G8929 Other chronic pain: Secondary | ICD-10-CM | POA: Diagnosis present

## 2020-08-10 DIAGNOSIS — G894 Chronic pain syndrome: Secondary | ICD-10-CM

## 2020-08-10 DIAGNOSIS — M79604 Pain in right leg: Secondary | ICD-10-CM | POA: Insufficient documentation

## 2020-08-10 DIAGNOSIS — M545 Low back pain, unspecified: Secondary | ICD-10-CM | POA: Insufficient documentation

## 2020-08-10 DIAGNOSIS — E1142 Type 2 diabetes mellitus with diabetic polyneuropathy: Secondary | ICD-10-CM | POA: Diagnosis present

## 2020-08-10 DIAGNOSIS — M47816 Spondylosis without myelopathy or radiculopathy, lumbar region: Secondary | ICD-10-CM

## 2020-08-10 DIAGNOSIS — N62 Hypertrophy of breast: Secondary | ICD-10-CM | POA: Diagnosis present

## 2020-08-10 DIAGNOSIS — Z6841 Body Mass Index (BMI) 40.0 and over, adult: Secondary | ICD-10-CM | POA: Insufficient documentation

## 2020-08-10 DIAGNOSIS — M792 Neuralgia and neuritis, unspecified: Secondary | ICD-10-CM

## 2020-08-10 DIAGNOSIS — M62838 Other muscle spasm: Secondary | ICD-10-CM | POA: Diagnosis present

## 2020-08-10 MED ORDER — OXYCODONE HCL 5 MG PO TABS: 5.0000 mg | ORAL_TABLET | Freq: Three times a day (TID) | ORAL | 0 refills | Status: DC | PRN

## 2020-08-10 MED ORDER — BACLOFEN 20 MG PO TABS: 20.0000 mg | ORAL_TABLET | Freq: Four times a day (QID) | ORAL | 2 refills | Status: DC

## 2020-08-10 MED ORDER — MELOXICAM 15 MG PO TABS: 15.0000 mg | ORAL_TABLET | Freq: Every day | ORAL | 2 refills | Status: DC

## 2020-08-10 MED ORDER — PREGABALIN 150 MG PO CAPS: 150.0000 mg | ORAL_CAPSULE | Freq: Three times a day (TID) | ORAL | 2 refills | Status: DC

## 2020-08-10 NOTE — Progress Notes (Signed)
Nursing Pain Medication Assessment:  Safety precautions to be maintained throughout the outpatient stay will include: orient to surroundings, keep bed in low position, maintain call bell within reach at all times, provide assistance with transfer out of bed and ambulation.  Medication Inspection Compliance: Pill count conducted under aseptic conditions, in front of the patient. Neither the pills nor the bottle was removed from the patient's sight at any time. Once count was completed pills were immediately returned to the patient in their original bottle.  Medication: Oxycodone IR Pill/Patch Count: 29 of 60 pills remain Pill/Patch Appearance: Markings consistent with prescribed medication Bottle Appearance: Standard pharmacy container. Clearly labeled. Filled Date: 9 / 20 / 21 Last Medication intake:  TodaySafety precautions to be maintained throughout the outpatient stay will include: orient to surroundings, keep bed in low position, maintain call bell within reach at all times, provide assistance with transfer out of bed and ambulation.

## 2020-08-10 NOTE — Patient Instructions (Addendum)
____________________________________________________________________________________________  Preparing for Procedure with Sedation  Procedure appointments are limited to planned procedures: . No Prescription Refills. . No disability issues will be discussed. . No medication changes will be discussed.  Instructions: . Oral Intake: Do not eat or drink anything for at least 8 hours prior to your procedure. (Exception: Blood Pressure Medication. See below.) . Transportation: Unless otherwise stated by your physician, you may drive yourself after the procedure. . Blood Pressure Medicine: Do not forget to take your blood pressure medicine with a sip of water the morning of the procedure. If your Diastolic (lower reading)is above 100 mmHg, elective cases will be cancelled/rescheduled. . Blood thinners: These will need to be stopped for procedures. Notify our staff if you are taking any blood thinners. Depending on which one you take, there will be specific instructions on how and when to stop it. . Diabetics on insulin: Notify the staff so that you can be scheduled 1st case in the morning. If your diabetes requires high dose insulin, take only  of your normal insulin dose the morning of the procedure and notify the staff that you have done so. . Preventing infections: Shower with an antibacterial soap the morning of your procedure. . Build-up your immune system: Take 1000 mg of Vitamin C with every meal (3 times a day) the day prior to your procedure. . Antibiotics: Inform the staff if you have a condition or reason that requires you to take antibiotics before dental procedures. . Pregnancy: If you are pregnant, call and cancel the procedure. . Sickness: If you have a cold, fever, or any active infections, call and cancel the procedure. . Arrival: You must be in the facility at least 30 minutes prior to your scheduled procedure. . Children: Do not bring children with you. . Dress appropriately:  Bring dark clothing that you would not mind if they get stained. . Valuables: Do not bring any jewelry or valuables.  Reasons to call and reschedule or cancel your procedure: (Following these recommendations will minimize the risk of a serious complication.) . Surgeries: Avoid having procedures within 2 weeks of any surgery. (Avoid for 2 weeks before or after any surgery). . Flu Shots: Avoid having procedures within 2 weeks of a flu shots or . (Avoid for 2 weeks before or after immunizations). . Barium: Avoid having a procedure within 7-10 days after having had a radiological study involving the use of radiological contrast. (Myelograms, Barium swallow or enema study). . Heart attacks: Avoid any elective procedures or surgeries for the initial 6 months after a "Myocardial Infarction" (Heart Attack). . Blood thinners: It is imperative that you stop these medications before procedures. Let us know if you if you take any blood thinner.  . Infection: Avoid procedures during or within two weeks of an infection (including chest colds or gastrointestinal problems). Symptoms associated with infections include: Localized redness, fever, chills, night sweats or profuse sweating, burning sensation when voiding, cough, congestion, stuffiness, runny nose, sore throat, diarrhea, nausea, vomiting, cold or Flu symptoms, recent or current infections. It is specially important if the infection is over the area that we intend to treat. . Heart and lung problems: Symptoms that may suggest an active cardiopulmonary problem include: cough, chest pain, breathing difficulties or shortness of breath, dizziness, ankle swelling, uncontrolled high or unusually low blood pressure, and/or palpitations. If you are experiencing any of these symptoms, cancel your procedure and contact your primary care physician for an evaluation.  Remember:  Regular Business hours are:    Monday to Thursday 8:00 AM to 4:00 PM  Provider's  Schedule: Milinda Pointer, MD:  Procedure days: Tuesday and Thursday 7:30 AM to 4:00 PM  Gillis Santa, MD:  Procedure days: Monday and Wednesday 7:30 AM to 4:00 PM ____________________________________________________________________________________________   ______________________________________________________________________________________________  Weight Management Required  URGENT: Your weight has been found to be adversely affecting your health.  Dear Shannon Benitez:  Your current Estimated body mass index is 41.57 kg/m as calculated from the following:   Height as of this encounter: $RemoveBeforeD'5\' 1"'[id number]$  (1.549 m).   Weight as of this encounter: 220 lb (99.8 kg).  Please use the table below to identify your weight category and associated incidence of chronic pain, secondary to your weight.  Body Mass Index (BMI) Classification BMI level (kg/m2) Category Associated incidence of chronic pain  <18  Underweight   18.5-24.9 Ideal body weight   25-29.9 Overweight  20%  30-34.9 Obese (Class I)  68%  35-39.9 Severe obesity (Class II)  136%  >40 Extreme obesity (Class III)  254%   In addition: You will be considered "Morbidly Obese", if your BMI is above 30 and you have one or more of the following conditions which are known to be caused and/or directly associated with obesity: 1.    Type 2 Diabetes (Which in turn can lead to cardiovascular diseases (CVD), stroke, peripheral vascular diseases (PVD), retinopathy, nephropathy, and neuropathy) 2.    Cardiovascular Disease (High Blood Pressure; Congestive Heart Failure; High Cholesterol; Coronary Artery Disease; Angina; or History of Heart Attacks) 3.    Breathing problems (Asthma; obesity-hypoventilation syndrome; obstructive sleep apnea; chronic inflammatory airway disease; reactive airway disease; or shortness of breath) 4.    Chronic kidney disease 5.    Liver disease (nonalcoholic fatty liver disease) 6.    High blood pressure 7.    Acid  reflux (gastroesophageal reflux disease; heartburn) 8.    Osteoarthritis (OA) (with any of the following: hip pain; knee pain; and/or low back pain) 9.    Low back pain (Lumbar Facet Syndrome; and/or Degenerative Disc Disease) 10.  Hip pain (Osteoarthritis of hip) (For every 1 lbs of added body weight, there is a 2 lbs increase in pressure inside of each hip articulation. 1:2 mechanical relationship) 11.  Knee pain (Osteoarthritis of knee) (For every 1 lbs of added body weight, there is a 4 lbs increase in pressure inside of each knee articulation. 1:4 mechanical relationship) (patients with a BMI>30 kg/m2 were 6.8 times more likely to develop knee OA than normal-weight individuals) 12.  Cancer: Epidemiological studies have shown that obesity is a risk factor for: post-menopausal breast cancer; cancers of the endometrium, colon and kidney cancer; malignant adenomas of the oesophagus. Obese subjects have an approximately 1.5-3.5-fold increased risk of developing these cancers compared with normal-weight subjects, and it has been estimated that between 15 and 45% of these cancers can be attributed to overweight. More recent studies suggest that obesity may also increase the risk of other types of cancer, including pancreatic, hepatic and gallbladder cancer. (Ref: Obesity and cancer. Pischon T, Nthlings U, [employer] H. Proc Nutr Soc. 2008 May;67(2):128-45. doi: 67.6195/K9326712458099833.) The International Agency for Research on Cancer (IARC) has identified 13 cancers associated with overweight and obesity: meningioma, multiple myeloma, adenocarcinoma of the esophagus, and cancers of the thyroid, postmenopausal breast cancer, gallbladder, stomach, liver, pancreas, kidney, ovaries, uterus, colon and rectal (colorectal) cancers. 63 percent of all cancers diagnosed in women and 24 percent of those diagnosed in men are associated with overweight  and obesity.  Recommendation: At this point it is urgent that you take a  step back and concentrate in loosing weight. Dedicate 100% of your efforts on this task. Nothing else will improve your health more than bringing your weight down and your BMI to less than 30. If you are here, you probably have chronic pain. Because most chronic pain patients have difficulty exercising secondary to their pain, you must rely on proper nutrition and diet in order to lose the weight. If your BMI is above 40, you should seriously consider bariatric surgery. A realistic goal is to lose 10% of your body weight over a period of 12 months.  Be honest to yourself, if over time you have unsuccessfully tried to lose weight, then it is time for you to seek professional help and to enter a medically supervised weight management program, and/or undergo bariatric surgery. Stop procrastinating.   Pain management considerations:  1.    Pharmacological Problems: Be advised that the use of opioid analgesics (oxycodone; hydrocodone; morphine; methadone; codeine; and all of their derivatives) have been associated with decreased metabolism and weight gain.  For this reason, should we see that you are unable to lose weight while taking these medications, it may become necessary for [place] to taper down and indefinitely discontinue them.  2.    Technical Problems: The incidence of successful interventional therapies decreases as the patient's BMI increases. It is much more difficult to accomplish a safe and effective interventional therapy on a patient with a BMI above 35. 3.    Radiation Exposure Problems: The x-rays machine, used to accomplish injection therapies, will automatically increase their x-ray output in order to capture an appropriate bone image. This means that radiation exposure increases exponentially with the patient's BMI. (The higher the BMI, the higher the radiation exposure.) Although the level of radiation used at a given time is still safe to the patient, it is not for the physician and/or assisting  staff. Unfortunately, radiation exposure is accumulative. Because physicians and the staff have to do procedures and be exposed on a daily basis, this can result in health problems such as cancer and radiation burns. Radiation exposure to the staff is monitored by the radiation batches that they wear. The exposure levels are reported back to the staff on a quarterly basis. Depending on levels of exposure, physicians and staff may be obligated by law to decrease this exposure. This means that they have the right and obligation to refuse providing therapies where they may be overexposed to radiation. For this reason, physicians may decline to offer therapies such as radiofrequency ablation or implants to patients with a BMI above 40. 4.    Current Trends: Be advised that the current trend is to no longer offer certain therapies to patients with a BMI equal to, or above 35, due to increase perioperative risks, increased technical procedural difficulties, and excessive radiation exposure to healthcare personnel.  ______________________________________________________________________________________________

## 2020-08-11 ENCOUNTER — Ambulatory Visit: Payer: Medicaid Other | Admitting: Pain Medicine

## 2020-08-17 NOTE — Progress Notes (Signed)
PROVIDER NOTE: Information contained herein reflects review and annotations entered in association with encounter. Interpretation of such information and data should be left to medically-trained personnel. Information provided to patient can be located elsewhere in the medical record under "Patient Instructions". Document created using STT-dictation technology, any transcriptional errors that may result from process are unintentional.    Patient: Shannon Benitez  Service Category: Procedure  Provider: [name], MD  DOB: 1971-08-30  DOS: 08/18/2020  Location: [facility]  MRN: [id number]  Setting: Ambulatory - outpatient  Referring Provider: Marygrace Drought, MD  Type: Established Patient  Specialty: Interventional Pain Management  PCP: Marygrace Drought, MD   Primary Reason for Visit: Interventional Pain Management Treatment. CC: Back Pain (lower)  Procedure:          Anesthesia, Analgesia, Anxiolysis:  Procedure #1: Type: Medial Branch Facet Block #4 Primary Purpose: Diagnostic/Therapeutric Region: Lumbar Level: L2, L3, L4, L5, & S1 Medial Branch Level(s) Target Area: For Lumbar Facet blocks, the target is the groove formed by the junction of the transverse process and superior articular process. For the L5 dorsal ramus, the target is the notch between superior articular process and sacral ala. For the S1 dorsal ramus, the target is the superior and lateral edge of the posterior S1 Sacral foramen. Approach: Posterior, paramedial, percutaneous approach. Laterality: Bilateral  Procedure #2: Type: Sacroiliac Joint Block  #1  Primary Purpose: Diagnostic Region: Posterior Lumbosacral Level: PSIS (Posterior Superior Iliac Spine) Sacroiliac Joint Target Area: For upper sacroiliac joint block(s), the target is the superior and posterior margin of the sacroiliac joint. Approach: Ipsilateral approach. Laterality: Bilateral  Type: Moderate (Conscious) Sedation combined  with Local Anesthesia Indication(s): Analgesia and Anxiety Route: Intravenous (IV) IV Access: Secured Sedation: Meaningful verbal contact was maintained at all times during the procedure  Local Anesthetic: Lidocaine 1-2%  Position: Prone   Indications: 1. Lumbar facet syndrome (Bilateral) (R>L)   2. Spondylosis without myelopathy or radiculopathy, lumbosacral region   3. Chronic sacroiliac joint pain (Bilateral) (L>R)   4. Other spondylosis, sacral and sacrococcygeal region   5. Lumbar facet hypertrophy (Multilevel) (Bilateral)   6. Osteoarthritis of facet joint of lumbar spine   7. Chronic low back pain (Bilateral) (L>R) w/o sciatica    Pain Score: Pre-procedure: 8 /10 Post-procedure: 0-No pain/10   Pre-op Assessment:  Shannon Benitez is a 49 y.o. (year old), female patient, seen today for interventional treatment. She  has a past surgical history that includes Abdominal hysterectomy; Cesarean section; Tumor removal (Left); Cataract extraction w/PHACO (Right, 03/18/2019); and Cataract extraction w/PHACO (Left, 04/13/2019). Shannon Benitez has a current medication list which includes the following prescription(s): aspirin-acetaminophen-caffeine, baclofen, dimethyl fumarate, duloxetine, lamotrigine, levetiracetam, loperamide, meloxicam, mirabegron er, oxycodone, [START ON 09/15/2020] oxycodone, [START ON 10/15/2020] oxycodone, pregabalin, propranolol, metformin, omeprazole, primidone, and sitagliptin, and the following Facility-Administered Medications: fentanyl and midazolam. Her primarily concern today is the Back Pain (lower)  Initial Vital Signs:  Pulse/HCG Rate: 74ECG Heart Rate: 65 Temp: (!) 97.4 F (36.3 C) Resp: 16 BP: 112/79 SpO2: 97 %  BMI: Estimated body mass index is 41.57 kg/m as calculated from the following:   Height as of this encounter: 5\' 1"  (1.549 m).   Weight as of this encounter: 220 lb (99.8 kg).  Risk Assessment: Allergies: Reviewed. She is allergic to  5-alpha reductase inhibitors, glatiramer, rebif [interferon beta-1a], and latex.  Allergy Precautions: None required Coagulopathies: Reviewed. None identified.  Blood-thinner therapy: None at this time Active Infection(s): Reviewed. None identified. Shannon Benitez  is afebrile  Site Confirmation: Shannon Benitez was asked to confirm the procedure and laterality before marking the site Procedure checklist: Completed Consent: Before the procedure and under the influence of no sedative(s), amnesic(s), or anxiolytics, the patient was informed of the treatment options, risks and possible complications. To fulfill our ethical and legal obligations, as recommended by the American Medical Association's Code of Ethics, I have informed the patient of my clinical impression; the nature and purpose of the treatment or procedure; the risks, benefits, and possible complications of the intervention; the alternatives, including doing nothing; the risk(s) and benefit(s) of the alternative treatment(s) or procedure(s); and the risk(s) and benefit(s) of doing nothing. The patient was provided information about the general risks and possible complications associated with the procedure. These may include, but are not limited to: failure to achieve desired goals, infection, bleeding, organ or nerve damage, allergic reactions, paralysis, and death. In addition, the patient was informed of those risks and complications associated to Spine-related procedures, such as failure to decrease pain; infection (i.e.: Meningitis, epidural or intraspinal abscess); bleeding (i.e.: epidural hematoma, subarachnoid hemorrhage, or any other type of intraspinal or peri-dural bleeding); organ or nerve damage (i.e.: Any type of peripheral nerve, nerve root, or spinal cord injury) with subsequent damage to sensory, motor, and/or autonomic systems, resulting in permanent pain, numbness, and/or weakness of one or several areas of the body; allergic  reactions; (i.e.: anaphylactic reaction); and/or death. Furthermore, the patient was informed of those risks and complications associated with the medications. These include, but are not limited to: allergic reactions (i.e.: anaphylactic or anaphylactoid reaction(s)); adrenal axis suppression; blood sugar elevation that in diabetics may result in ketoacidosis or comma; water retention that in patients with history of congestive heart failure may result in shortness of breath, pulmonary edema, and decompensation with resultant heart failure; weight gain; swelling or edema; medication-induced neural toxicity; particulate matter embolism and blood vessel occlusion with resultant organ, and/or nervous system infarction; and/or aseptic necrosis of one or more joints. Finally, the patient was informed that Medicine is not an exact science; therefore, there is also the possibility of unforeseen or unpredictable risks and/or possible complications that may result in a catastrophic outcome. The patient indicated having understood very clearly. We have given the patient no guarantees and we have made no promises. Enough time was given to the patient to ask questions, all of which were answered to the patient's satisfaction. Shannon Benitez has indicated that she wanted to continue with the procedure. Attestation: I, the ordering provider, attest that I have discussed with the patient the benefits, risks, side-effects, alternatives, likelihood of achieving goals, and potential problems during recovery for the procedure that I have provided informed consent. Date  Time: 08/18/2020  9:07 AM  Pre-Procedure Preparation:  Monitoring: As per clinic protocol. Respiration, ETCO2, SpO2, BP, heart rate and rhythm monitor placed and checked for adequate function Safety Precautions: Patient was assessed for positional comfort and pressure points before starting the procedure. Time-out: I initiated and conducted the "Time-out"  before starting the procedure, as per protocol. The patient was asked to participate by confirming the accuracy of the "Time Out" information. Verification of the correct person, site, and procedure were performed and confirmed by me, the nursing staff, and the patient. "Time-out" conducted as per Joint Commission's Universal Protocol (UP.01.01.01). Time: 1012  Description of Procedure #1:   Time-out: "Time-out" completed before starting procedure, as per protocol. Area Prepped: Entire Posterior Lumbosacral Region DuraPrep (Iodine Povacrylex [0.7% available iodine] and  Isopropyl Alcohol, 74% w/w) Safety Precautions: Aspiration looking for blood return was conducted prior to all injections. At no point did we inject any substances, as a needle was being advanced. No attempts were made at seeking any paresthesias. Safe injection practices and needle disposal techniques used. Medications properly checked for expiration dates. SDV (single dose vial) medications used.  Description of the Procedure: Protocol guidelines were followed. The patient was placed in position over the fluoroscopy table. The target area was identified and the area prepped in the usual manner. Skin & deeper tissues infiltrated with local anesthetic. Appropriate amount of time allowed to pass for local anesthetics to take effect. The procedure needle was introduced through the skin, ipsilateral to the reported pain, and advanced to the target area. Employing the "Medial Branch Technique", the needles were advanced to the angle made by the superior and medial portion of the transverse process, and the lateral and inferior portion of the superior articulating process of the targeted vertebral bodies. This area is known as "Burton's Eye" or the "Eye of the [place] Dog". A procedure needle was introduced through the skin, and this time advanced to the angle made by the superior and medial border of the sacral ala, and the lateral border of the  S1 vertebral body. This last needle was later repositioned at the superior and lateral border of the posterior S1 foramen. Negative aspiration confirmed. Solution injected in intermittent fashion, asking for systemic symptoms every 0.5cc of injectate. The needles were then removed and the area cleansed, making sure to leave some of the prepping solution back to take advantage of its long term bactericidal properties. Start Time: 1012 hrs. Materials:  Needle(s) Type: Spinal Needle Gauge: 22G Length: 7-in Medication(s): Please see orders for medications and dosing details.  Description of Procedure # 2:   Area Prepped: Entire Posterior Lumbosacral Region DuraPrep (Iodine Povacrylex [0.7% available iodine] and Isopropyl Alcohol, 74% w/w) Safety Precautions: Aspiration looking for blood return was conducted prior to all injections. At no point did we inject any substances, as a needle was being advanced. No attempts were made at seeking any paresthesias. Safe injection practices and needle disposal techniques used. Medications properly checked for expiration dates. SDV (single dose vial) medications used. Description of the Procedure: Protocol guidelines were followed. The patient was placed in position over the fluoroscopy table. The target area was identified and the area prepped in the usual manner. Skin & deeper tissues infiltrated with local anesthetic. Appropriate amount of time allowed to pass for local anesthetics to take effect. The procedure needle was advanced under fluoroscopic guidance into the sacroiliac joint until a firm endpoint was obtained. Proper needle placement secured. Negative aspiration confirmed. Solution injected in intermittent fashion, asking for systemic symptoms every 0.5cc of injectate. The needles were then removed and the area cleansed, making sure to leave some of the prepping solution back to take advantage of its long term bactericidal properties. Vitals:   08/18/20  1046 08/18/20 1056 08/18/20 1106 08/18/20 1109  BP: 100/70 (!) 87/58 (!) 79/43 (!) 87/59  Pulse:      Resp: 16 17    Temp:  (!) 96.8 F (36 C)    TempSrc:  Temporal    SpO2: 99% 99%    Weight:      Height:        End Time: 1028 hrs. Materials:  Needle(s) Type: Spinal Needle Gauge: 22G Length: 7-in Medication(s): Please see orders for medications and dosing details.  Imaging Guidance (Spinal):  Type of Imaging Technique: Fluoroscopy Guidance (Spinal) Indication(s): Assistance in needle guidance and placement for procedures requiring needle placement in or near specific anatomical locations not easily accessible without such assistance. Exposure Time: Please see nurses notes. Contrast: None used. Fluoroscopic Guidance: I was personally present during the use of fluoroscopy. "Tunnel Vision Technique" used to obtain the best possible view of the target area. Parallax error corrected before commencing the procedure. "Direction-depth-direction" technique used to introduce the needle under continuous pulsed fluoroscopy. Once target was reached, antero-posterior, oblique, and lateral fluoroscopic projection used confirm needle placement in all planes. Images permanently stored in EMR. Interpretation: No contrast injected. I personally interpreted the imaging intraoperatively. Adequate needle placement confirmed in multiple planes. Permanent images saved into the patient's record.  Antibiotic Prophylaxis:   Anti-infectives (From admission, onward)   None     Indication(s): None identified  Post-operative Assessment:  Post-procedure Vital Signs:  Pulse/HCG Rate: 7470 Temp: (!) 96.8 F (36 C) Resp: 17 BP: (!) 87/59 (MD notified.) SpO2: 99 %  EBL: None  Complications: No immediate post-treatment complications observed by team, or reported by patient.  Note: The patient tolerated the entire procedure well. A repeat set of vitals were taken after the procedure and the patient  was kept under observation following institutional policy, for this type of procedure. Post-procedural neurological assessment was performed, showing return to baseline, prior to discharge. The patient was provided with post-procedure discharge instructions, including a section on how to identify potential problems. Should any problems arise concerning this procedure, the patient was given instructions to immediately contact us, at any time, without hesitation. In any case, we plan to contact the patient by telephone for a follow-up status report regarding this interventional procedure.  Comments:  No additional relevant information.  Plan of Care  Orders:  Orders Placed This Encounter  Procedures  . LUMBAR FACET(MEDIAL BRANCH NERVE BLOCK) MBNB    Scheduling Instructions:     Procedure: Lumbar facet block (AKA.: Lumbosacral medial branch nerve block)     Side: Bilateral     Level: L3-4, L4-5, & L5-S1 Facets (L2, L3, L4, L5, & S1 Medial Branch Nerves)     Sedation: Patient's choice.     Timeframe: Today    Order Specific Question:   Where will this procedure be performed?    Answer:   [facility]  . SACROILIAC JOINT INJECTION    Scheduling Instructions:     Side: Bilateral     Sedation: Patient's choice.     Timeframe: Today    Order Specific Question:   Where will this procedure be performed?    Answer:   [facility]  . DG PAIN [facility] C-ARM 1-60 MIN NO REPORT    Intraoperative interpretation by procedural physician at [place].    Standing Status:   Standing    Number of Occurrences:   1    Order Specific Question:   Reason for exam:    Answer:   Assistance in needle guidance and placement for procedures requiring needle placement in or near specific anatomical locations not easily accessible without such assistance.  . Informed Consent Details: Physician/Practitioner Attestation; Transcribe to consent form and obtain patient signature    Nursing Order:  Transcribe to consent form and obtain patient signature. Note: Always confirm laterality of pain with Shannon Benitez, before procedure.    Order Specific Question:   Physician/Practitioner attestation of informed consent for procedure/surgical case    Answer:   I, the physician/practitioner,  attest that I have discussed with the patient the benefits, risks, side effects, alternatives, likelihood of achieving goals and potential problems during recovery for the procedure that I have provided informed consent.    Order Specific Question:   Procedure    Answer:   Lumbar Facet Block  under fluoroscopic guidance    Order Specific Question:   Physician/Practitioner performing the procedure    Answer:   Kiasha Bellin A. Dossie Arbour MD    Order Specific Question:   Indication/Reason    Answer:   Low Back Pain, with our without leg pain, due to Facet Joint Arthralgia (Joint Pain) Spondylosis (Arthritis of the Spine), without myelopathy or radiculopathy (Nerve Damage).  . Provide equipment / supplies at bedside    "Block Tray" (Disposable  single use) Needle type: SpinalSpinal Amount/quantity: 5 Size: Long (7-inch) Gauge: 22G    Standing Status:   Standing    Number of Occurrences:   1    Order Specific Question:   Specify    Answer:   Block Tray  . Informed Consent Details: Physician/Practitioner Attestation; Transcribe to consent form and obtain patient signature    Provider Attestation: I, Corinne Dossie Arbour, MD, (Pain Management Specialist), the physician/practitioner, attest that I have discussed with the patient the benefits, risks, side effects, alternatives, likelihood of achieving goals and potential problems during recovery for the procedure that I have provided informed consent.    Scheduling Instructions:     Nursing Order: Transcribe to consent form and obtain patient signature.     Note: Always confirm laterality of pain with Shannon Benitez, before procedure.    Order Specific Question:    Physician/Practitioner attestation of informed consent for procedure/surgical case    Answer:   I, the physician/practitioner, attest that I have discussed with the patient the benefits, risks, side effects, alternatives, likelihood of achieving goals and potential problems during recovery for the procedure that I have provided informed consent.    Order Specific Question:   Procedure    Answer:   Sacroiliac Joint Block    Order Specific Question:   Physician/Practitioner performing the procedure    Answer:   Nitish Roes A. Dossie Arbour MD    Order Specific Question:   Indication/Reason    Answer:   Chronic Low Back and Hip Pain secondary to Sacroiliac Joint Pain (Arthralgia/Arthropathy)   Chronic Opioid Analgesic:  Oxycodone IR 5 mg, 1 tab PO BID (10 mg/day of oxycodone)  MME/day:15mg /day.   Medications ordered for procedure: Meds ordered this encounter  Medications  . lidocaine (XYLOCAINE) 2 % (with pres) injection 400 mg  . lactated ringers infusion 1,000 mL  . midazolam (VERSED) 5 MG/5ML injection 1-2 mg    Make sure Flumazenil is available in the pyxis when using this medication. If oversedation occurs, administer 0.2 mg IV over 15 sec. If after 45 sec no response, administer 0.2 mg again over 1 min; may repeat at 1 min intervals; not to exceed 4 doses (1 mg)  . fentaNYL (SUBLIMAZE) injection 25-50 mcg    Make sure Narcan is available in the pyxis when using this medication. In the event of respiratory depression (RR< 8/min): Titrate NARCAN (naloxone) in increments of 0.1 to 0.2 mg IV at 2-3 minute intervals, until desired degree of reversal.  . ropivacaine (PF) 2 mg/mL (0.2%) (NAROPIN) injection 18 mL  . triamcinolone acetonide (KENALOG-40) injection 80 mg  . methylPREDNISolone acetate (DEPO-MEDROL) injection 80 mg  . ropivacaine (PF) 2 mg/mL (0.2%) (NAROPIN) injection 9 mL   Medications  administered: We administered lidocaine, lactated ringers, midazolam, fentaNYL, ropivacaine (PF) 2  mg/mL (0.2%), triamcinolone acetonide, methylPREDNISolone acetate, and ropivacaine (PF) 2 mg/mL (0.2%).  See the medical record for exact dosing, route, and time of administration.  Follow-up plan:   Return in about 2 weeks (around 09/01/2020) for (VV), (PP) Follow-up.       Interventional management options: Considering:   NOTE: NO RFA until BMI is <30 Continue encouraging the patient to bring her BMI below 35. Diagnostic bilateral T11-12 lumbar facet block   Diagnostic right T7-8 TESI   Diagnostic right T7 TFESI   Diagnostic ML CESI   Possible candidate for intrathecal pump trial and implant    Palliative PRN treatment(s):   Palliative bilateral lumbar facet block #4  Palliative left lumbar facet RFA #4 (last done on 05/24/2020)  Palliative right lumbar facet RFA #4 (last done on 04/07/2020)      Recent Visits Date Type Provider Dept  08/10/20 Office Visit Milinda Pointer, MD [facility]  07/18/20 Telemedicine Milinda Pointer, MD [facility]  05/24/20 Procedure visit Milinda Pointer, MD [facility]  Showing recent visits within past 90 days and meeting all other requirements Today's Visits Date Type Provider Dept  08/18/20 Procedure visit Milinda Pointer, MD [facility]  Showing today's visits and meeting all other requirements Future Appointments Date Type Provider Dept  09/01/20 Appointment Milinda Pointer, MD [facility]  11/09/20 Appointment Milinda Pointer, MD [facility]  Showing future appointments within next 90 days and meeting all other requirements  Disposition: Discharge home  Discharge (Date  Time): 08/18/2020; 1111 hrs.   Primary Care Physician: Marygrace Drought, MD Location: [facility] Note by: [name], MD Date: 08/18/2020; Time: 3:02 PM  Disclaimer:  Medicine is not an Chief Strategy Officer. The only guarantee in medicine is that nothing is  guaranteed. It is important to note that the decision to proceed with this intervention was based on the information collected from the patient. The Data and conclusions were drawn from the patient's questionnaire, the interview, and the physical examination. Because the information was provided in large part by the patient, it cannot be guaranteed that it has not been purposely or unconsciously manipulated. Every effort has been made to obtain as much relevant data as possible for this evaluation. It is important to note that the conclusions that lead to this procedure are derived in large part from the available data. Always take into account that the treatment will also be dependent on availability of resources and existing treatment guidelines, considered by other Pain Management Practitioners as being common knowledge and practice, at the time of the intervention. For Medico-Legal purposes, it is also important to point out that variation in procedural techniques and pharmacological choices are the acceptable norm. The indications, contraindications, technique, and results of the above procedure should only be interpreted and judged by a Board-Certified Interventional Pain Specialist with extensive familiarity and expertise in the same exact procedure and technique.

## 2020-08-18 ENCOUNTER — Encounter: Payer: Self-pay | Admitting: Pain Medicine

## 2020-08-18 ENCOUNTER — Other Ambulatory Visit: Payer: Self-pay

## 2020-08-18 ENCOUNTER — Ambulatory Visit (HOSPITAL_BASED_OUTPATIENT_CLINIC_OR_DEPARTMENT_OTHER): Payer: Medicaid Other | Admitting: Pain Medicine

## 2020-08-18 ENCOUNTER — Ambulatory Visit
Admission: RE | Admit: 2020-08-18 | Discharge: 2020-08-18 | Disposition: A | Discharge status: Home / Self Care | Payer: Medicaid Other | Source: Ambulatory Visit | Attending: Pain Medicine | Admitting: Pain Medicine

## 2020-08-18 VITALS — BP 87/59 | HR 74 | Temp 96.8°F | Resp 17 | Ht 61.0 in | Wt 220.0 lb

## 2020-08-18 DIAGNOSIS — M47816 Spondylosis without myelopathy or radiculopathy, lumbar region: Secondary | ICD-10-CM | POA: Insufficient documentation

## 2020-08-18 DIAGNOSIS — G8929 Other chronic pain: Secondary | ICD-10-CM | POA: Insufficient documentation

## 2020-08-18 DIAGNOSIS — Z791 Long term (current) use of non-steroidal anti-inflammatories (NSAID): Secondary | ICD-10-CM | POA: Diagnosis not present

## 2020-08-18 DIAGNOSIS — Z79899 Other long term (current) drug therapy: Secondary | ICD-10-CM | POA: Insufficient documentation

## 2020-08-18 DIAGNOSIS — M545 Low back pain, unspecified: Secondary | ICD-10-CM | POA: Diagnosis not present

## 2020-08-18 DIAGNOSIS — M533 Sacrococcygeal disorders, not elsewhere classified: Secondary | ICD-10-CM | POA: Diagnosis not present

## 2020-08-18 DIAGNOSIS — Z7982 Long term (current) use of aspirin: Secondary | ICD-10-CM | POA: Diagnosis not present

## 2020-08-18 DIAGNOSIS — M47817 Spondylosis without myelopathy or radiculopathy, lumbosacral region: Secondary | ICD-10-CM

## 2020-08-18 DIAGNOSIS — M47898 Other spondylosis, sacral and sacrococcygeal region: Secondary | ICD-10-CM | POA: Diagnosis not present

## 2020-08-18 DIAGNOSIS — Z7984 Long term (current) use of oral hypoglycemic drugs: Secondary | ICD-10-CM | POA: Insufficient documentation

## 2020-08-18 DIAGNOSIS — Z7901 Long term (current) use of anticoagulants: Secondary | ICD-10-CM | POA: Diagnosis not present

## 2020-08-18 MED ORDER — TRIAMCINOLONE ACETONIDE 40 MG/ML IJ SUSP
80.0000 mg | Freq: Once | INTRAMUSCULAR | Status: AC
  Administered 2020-08-18: 80 mg
  Filled 2020-08-18: qty 2

## 2020-08-18 MED ORDER — FENTANYL CITRATE (PF) 100 MCG/2ML IJ SOLN
25.0000 ug | INTRAMUSCULAR | Status: DC | PRN
  Administered 2020-08-18: 50 ug via INTRAVENOUS
  Filled 2020-08-18: qty 2

## 2020-08-18 MED ORDER — LIDOCAINE HCL 2 % IJ SOLN
20.0000 mL | Freq: Once | INTRAMUSCULAR | Status: AC
  Administered 2020-08-18: 400 mg

## 2020-08-18 MED ORDER — ROPIVACAINE HCL 2 MG/ML IJ SOLN
9.0000 mL | Freq: Once | INTRAMUSCULAR | Status: AC
  Administered 2020-08-18: 9 mL via INTRA_ARTICULAR
  Filled 2020-08-18: qty 10

## 2020-08-18 MED ORDER — ROPIVACAINE HCL 2 MG/ML IJ SOLN
18.0000 mL | Freq: Once | INTRAMUSCULAR | Status: AC
  Administered 2020-08-18: 18 mL via PERINEURAL
  Filled 2020-08-18: qty 20

## 2020-08-18 MED ORDER — LACTATED RINGERS IV SOLN
1000.0000 mL | Freq: Once | INTRAVENOUS | Status: AC
  Administered 2020-08-18: 1000 mL via INTRAVENOUS

## 2020-08-18 MED ORDER — METHYLPREDNISOLONE ACETATE 80 MG/ML IJ SUSP
80.0000 mg | Freq: Once | INTRAMUSCULAR | Status: AC
  Administered 2020-08-18: 80 mg via INTRA_ARTICULAR
  Filled 2020-08-18: qty 1

## 2020-08-18 MED ORDER — MIDAZOLAM HCL 5 MG/5ML IJ SOLN
1.0000 mg | INTRAMUSCULAR | Status: DC | PRN
  Administered 2020-08-18: 3 mg via INTRAVENOUS
  Filled 2020-08-18: qty 5

## 2020-08-18 NOTE — Patient Instructions (Signed)

## 2020-08-18 NOTE — Progress Notes (Signed)
Safety precautions to be maintained throughout the outpatient stay will include: orient to surroundings, keep bed in low position, maintain call bell within reach at all times, provide assistance with transfer out of bed and ambulation.  

## 2020-08-19 ENCOUNTER — Telehealth: Payer: Self-pay

## 2020-08-19 NOTE — Telephone Encounter (Signed)
Denies any needs at this time. Instructed to call if needed. 

## 2020-08-24 ENCOUNTER — Ambulatory Visit: Payer: Medicaid Other | Admitting: Dietician

## 2020-08-31 ENCOUNTER — Encounter: Payer: Self-pay | Admitting: Pain Medicine

## 2020-09-01 ENCOUNTER — Ambulatory Visit: Payer: Medicaid Other | Attending: Pain Medicine | Admitting: Pain Medicine

## 2020-09-01 ENCOUNTER — Other Ambulatory Visit: Payer: Self-pay

## 2020-09-01 ENCOUNTER — Telehealth: Payer: Medicaid Other | Admitting: Pain Medicine

## 2020-09-01 DIAGNOSIS — M533 Sacrococcygeal disorders, not elsewhere classified: Secondary | ICD-10-CM

## 2020-09-01 DIAGNOSIS — R937 Abnormal findings on diagnostic imaging of other parts of musculoskeletal system: Secondary | ICD-10-CM

## 2020-09-01 DIAGNOSIS — M5136 Other intervertebral disc degeneration, lumbar region: Secondary | ICD-10-CM

## 2020-09-01 DIAGNOSIS — M545 Low back pain, unspecified: Secondary | ICD-10-CM | POA: Diagnosis not present

## 2020-09-01 DIAGNOSIS — M5137 Other intervertebral disc degeneration, lumbosacral region: Secondary | ICD-10-CM

## 2020-09-01 DIAGNOSIS — G8929 Other chronic pain: Secondary | ICD-10-CM

## 2020-09-01 DIAGNOSIS — M47816 Spondylosis without myelopathy or radiculopathy, lumbar region: Secondary | ICD-10-CM

## 2020-09-01 NOTE — Progress Notes (Signed)
Patient: Shannon Benitez  Service Category: E/M  Provider: [name], MD  DOB: 04-Jun-1971  DOS: 09/01/2020  Location: Office  MRN: [id number]  Setting: Ambulatory outpatient  Referring Provider: Marygrace Drought, MD  Type: Established Patient  Specialty: Interventional Pain Management  PCP: Marygrace Drought, MD  Location: Remote location  Delivery: TeleHealth     Virtual Encounter - Pain Management PROVIDER NOTE: Information contained herein reflects review and annotations entered in association with encounter. Interpretation of such information and data should be left to medically-trained personnel. Information provided to patient can be located elsewhere in the medical record under "Patient Instructions". Document created using STT-dictation technology, any transcriptional errors that may result from process are unintentional.    Contact & Pharmacy Preferred: [phone] Home: [phone] (home) Mobile: [phone] (mobile) E-mail: [email or web address]  [place] [place] [address] [address] Phone: [phone] Fax: [phone]  [id number] [id number]: [phone]: [phone]  Pre-screening  Ms. Ehresman offered "in-person" vs "virtual" encounter. She indicated preferring virtual for this encounter.   Reason COVID-19*  Social distancing based on CDC and AMA recommendations.   I contacted Necole Conely on 09/01/2020 via telephone.      I clearly identified myself as FGaspar Cola MD. I verified that I was speaking with the correct person using two identifiers (Name: EBlakeley Scheier and date of birth: [birth year].  Consent I sought verbal advanced consent from [facility] interactions. I informed Ms. Gaulin of possible security and privacy concerns, risks, and limitations associated with providing "not-in-person"  medical evaluation and management services. I also informed Ms. Stucker of the availability of "in-person" appointments. Finally, I informed her that there would be a charge for the virtual visit and that she could be  personally, fully or partially, financially responsible for it. Ms. WKamenexpressed understanding and agreed to proceed.   Historic Elements   Ms. EToree Edlingis a 49y.o. year old, female patient evaluated today after our last contact on 08/18/2020. Ms. WSlingerland has a past medical history of Acute postoperative pain (04/30/2019), Diabetes mellitus without complication (HMarydel, GERD (gastroesophageal reflux disease), Headache, MS (multiple sclerosis) (HTomales, Numbness and tingling of both legs below knees, Osteoarthritis, Vertigo, and Wears dentures. She also  has a past surgical history that includes Abdominal hysterectomy; Cesarean section; Tumor removal (Left); Cataract extraction w/PHACO (Right, 03/18/2019); and Cataract extraction w/PHACO (Left, 04/13/2019). Ms. WPeasleyhas a current medication list which includes the following prescription(s): aspirin-acetaminophen-caffeine, baclofen, dimethyl fumarate, duloxetine, lamotrigine, levetiracetam, loperamide, meloxicam, metformin, mirabegron er, omeprazole, oxycodone, [START ON 09/15/2020] oxycodone, [START ON 10/15/2020] oxycodone, pregabalin, primidone, propranolol, and sitagliptin. She  reports that she quit smoking about 3 years ago. Her smoking use included cigarettes. She has a 9.00 pack-year smoking history. She has quit using smokeless tobacco. She reports that she does not drink alcohol. No history on file for drug use. Ms. WBahneris allergic to 5-alpha reductase inhibitors, glatiramer, rebif [interferon beta-1a], and latex.   HPI  Today, she is being contacted for a post-procedure assessment.  The patient indicates that the first day she did well, but after that on the second day, the pain came back just like it  was before.  She refers that the pain is so bad that she is having difficulty walking and the pain is going all the way down into the ankle  but not into the foot.  At this point we will go ahead and repeat her lumbar MRI, the last of which was done in 2018.  It looks like this has progressed. Possible candidate for a sprint peripheral nerve stimulator.  Post-Procedure Evaluation  Procedure (08/18/2020): Diagnostic/therapeutic bilateral lumbar facet block #4 + diagnostic bilateral SI joint block #1 under fluoroscopic guidance and IV sedation Pre-procedure pain level: 8/10 Post-procedure: 0/10 (100% relief)  Sedation: Sedation provided.  Effectiveness during initial hour after procedure(Ultra-Short Term Relief): 100 %.  Local anesthetic used: Long-acting (4-6 hours) Effectiveness: Defined as any analgesic benefit obtained secondary to the administration of local anesthetics. This carries significant diagnostic value as to the etiological location, or anatomical origin, of the pain. Duration of benefit is expected to coincide with the duration of the local anesthetic used.  Effectiveness during initial 4-6 hours after procedure(Short-Term Relief): 100 %.  Long-term benefit: Defined as any relief past the pharmacologic duration of the local anesthetics.  Effectiveness past the initial 6 hours after procedure(Long-Term Relief): 0 % (patient reports being numb x 14 hours and then pain resumed.).  Current benefits: Defined as benefit that persist at this time.   Analgesia:  Back to baseline Function: Back to baseline ROM: Back to baseline  Pharmacotherapy Assessment  Analgesic: Oxycodone IR 5 mg, 1 tab PO BID (10 mg/day of oxycodone)  MME/day:63m/day.   Monitoring: [place] PMP: PDMP reviewed during this encounter.       Pharmacotherapy: No side-effects or adverse reactions reported. Compliance: No problems identified. Effectiveness: Clinically acceptable. Plan: Refer to "POC".  UDS:  Summary   Date Value Ref Range Status  11/27/2018 FINAL  Final    Comment:    ==================================================================== [facility] COMP DRUG ANALYSIS,UR ==================================================================== Test                             Result       Flag       Units Drug Present and Declared for Prescription Verification   7-aminoclonazepam              319          EXPECTED   ng/mg creat    7-aminoclonazepam is an expected metabolite of clonazepam. Source    of clonazepam is a scheduled prescription medication.   Primidone                      PRESENT      EXPECTED   Phenobarbital                  PRESENT      EXPECTED    Phenobarbital is an expected metabolite of primidone;    Phenobarbital may also be administered as a prescription drug.   Gabapentin                     PRESENT      EXPECTED   Lamotrigine                    PRESENT      EXPECTED   Baclofen                       PRESENT      EXPECTED   Duloxetine                     PRESENT  EXPECTED   Propranolol                    PRESENT      EXPECTED Drug Present not Declared for Prescription Verification   Metaxalone                     PRESENT      UNEXPECTED   Acetaminophen                  PRESENT      UNEXPECTED   Diphenhydramine                PRESENT      UNEXPECTED ==================================================================== Test                      Result    Flag   Units      Ref Range   Creatinine              47               mg/dL      >=20 ==================================================================== Declared Medications:  The flagging and interpretation on this report are based on the  following declared medications.  Unexpected results may arise from  inaccuracies in the declared medications.  **Note: The testing scope of this panel includes these medications:  Baclofen (Lioresal)  Clonazepam (Klonopin)  Duloxetine (Cymbalta)  Gabapentin (Neurontin)   Lamotrigine (Lamictal)  Primidone (Mysoline)  Propranolol (Inderal)  **Note: The testing scope of this panel does not include following  reported medications:  Dimethyl Fumarate  Metformin (Glucophage)  Mirabegron (Myrbetriq)  Omeprazole (Prilosec)  Sitagliptin (Januvia) ==================================================================== For clinical consultation, please call [phone]. ====================================================================     Laboratory Chemistry Profile   Renal Lab Results  Component Value Date   BUN 13 09/25/2019   CREATININE 0.53 09/25/2019   BCR 13 11/27/2018   GFRAA >60 09/25/2019   GFRNONAA >60 09/25/2019     Hepatic Lab Results  Component Value Date   AST 17 11/27/2018   ALT 35 08/18/2018   ALBUMIN 4.1 11/27/2018   ALKPHOS 91 11/27/2018     Electrolytes Lab Results  Component Value Date   NA 136 09/25/2019   K 3.8 09/25/2019   CL 101 09/25/2019   CALCIUM 8.8 (L) 09/25/2019   MG 2.2 11/27/2018     Bone Lab Results  Component Value Date   25OHVITD1 55 11/27/2018   25OHVITD2 <1.0 11/27/2018   25OHVITD3 55 11/27/2018     Inflammation (CRP: Acute Phase) (ESR: Chronic Phase) Lab Results  Component Value Date   CRP 10 11/27/2018   ESRSEDRATE 42 (H) 11/27/2018       Note: Above Lab results reviewed.  Imaging  DG PAIN CLINIC C-ARM 1-60 MIN NO REPORT Fluoro was used, but no Radiologist interpretation will be provided.  Please refer to "NOTES" tab for provider progress note.  Assessment  The primary encounter diagnosis was Chronic low back pain (Bilateral) (L>R) w/o sciatica. Diagnoses of Lumbar facet syndrome (Bilateral) (R>L), Chronic sacroiliac joint pain (Bilateral) (L>R), Other intervertebral disc degeneration, lumbar region, DDD (degenerative disc disease), lumbosacral, Lumbar facet hypertrophy (Multilevel) (Bilateral), Abnormal MRI, lumbar spine (07/02/2017), and Osteoarthritis of facet joint of lumbar spine  were also pertinent to this visit.  Plan of Care  Problem-specific:  No problem-specific Assessment & Plan notes found for this encounter.  Ms. Lashaye Fisk has a current medication list which includes the following long-term medication(s):  baclofen, duloxetine, lamotrigine, levetiracetam, meloxicam, metformin, omeprazole, oxycodone, [START ON 09/15/2020] oxycodone, [START ON 10/15/2020] oxycodone, pregabalin, primidone, propranolol, and sitagliptin.  Pharmacotherapy (Medications Ordered): No orders of the defined types were placed in this encounter.  Orders:  Orders Placed This Encounter  Procedures  . MR LUMBAR SPINE WO CONTRAST    Patient presents with axial pain with possible radicular component.  In addition to any acute findings, please report on:  1. Facet (Zygapophyseal) joint DJD (Hypertrophy, space narrowing, subchondral sclerosis, and/or osteophyte formation) 2. DDD and/or IVDD (Loss of disc height, desiccation or "Black disc disease") 3. Pars defects 4. Spondylolisthesis, spondylosis, and/or spondyloarthropathies (include Degree/Grade of displacement in mm) 5. Vertebral body Fractures, including age (old, new/acute) 10. Modic Type Changes 7. Demineralization 8. Bone pathology 9. Central, Lateral Recess, and/or Foraminal Stenosis (include AP diameter of stenosis in mm) 10. Surgical changes (hardware type, status, and presence of fibrosis)  NOTE: Please specify level(s) and laterality.    Standing Status:   Future    Standing Expiration Date:   10/02/2020    Scheduling Instructions:     Imaging must be done as soon as possible. Inform patient that order will expire within 30 days and I will not renew it.    Order Specific Question:   What is the patient's sedation requirement?    Answer:   No Sedation    Order Specific Question:   Does the patient have a pacemaker or implanted devices?    Answer:   No    Order Specific Question:   Preferred imaging location?     Answer:   [facility] (table limit-350lbs)    Order Specific Question:   Call Results- Best Contact Number?    Answer:   [phone] ([facility])    Order Specific Question:   Radiology Contrast Protocol - do NOT remove file path    Answer:   [email or web address]   Follow-up plan:   Return for (F2F), (s/p Tests) to evaluate results of MRI.      Interventional management options: Considering:   NOTE: NO RFA until BMI is <30 Continue encouraging the patient to bring her BMI below 35. Diagnostic bilateral T11-12 lumbar facet block   Diagnostic right T7-8 TESI   Diagnostic right T7 TFESI   Diagnostic ML CESI   Possible candidate for intrathecal pump trial and implant    Palliative PRN treatment(s):   Palliative bilateral lumbar facet block #4  Palliative left lumbar facet RFA #4 (last done on 05/24/2020)  Palliative right lumbar facet RFA #4 (last done on 04/07/2020)       Recent Visits Date Type Provider Dept  08/18/20 Procedure visit Milinda Pointer, MD [facility]  08/10/20 Office Visit Milinda Pointer, MD [facility]  07/18/20 Telemedicine Milinda Pointer, MD [facility]  Showing recent visits within past 90 days and meeting all other requirements Today's Visits Date Type Provider Dept  09/01/20 Telemedicine Milinda Pointer, MD [facility]  Showing today's visits and meeting all other requirements Future Appointments Date Type Provider Dept  11/09/20 Appointment Milinda Pointer, MD [facility]  Showing future appointments within next 90 days and meeting all other requirements  I discussed the assessment and treatment plan with the patient. The patient was provided an opportunity to ask questions and all were answered. The patient agreed with the plan and demonstrated an understanding of the instructions.  Patient advised to call back or seek an in-person evaluation if the  symptoms or condition  worsens.  Duration of encounter: 15 minutes.  Note by: [name], MD Date: 09/01/2020; Time: 11:44 AM

## 2020-09-14 ENCOUNTER — Encounter: Payer: Self-pay | Admitting: Pain Medicine

## 2020-09-14 NOTE — Progress Notes (Signed)
Patient: Shannon Benitez  Service Category: E/M  Provider: Francisco A Naveira, MD  DOB: 01/16/1971  DOS: 09/15/2020  Location: Office  MRN: [id number]  Setting: Ambulatory outpatient  Referring Provider: Heffington, Mark, MD  Type: Established Patient  Specialty: Interventional Pain Management  PCP: Heffington, Mark, MD  Location: Remote location  Delivery: TeleHealth     Virtual Encounter - Pain Management PROVIDER NOTE: Information contained herein reflects review and annotations entered in association with encounter. Interpretation of such information and data should be left to medically-trained personnel. Information provided to patient can be located elsewhere in the medical record under "Patient Instructions". Document created using STT-dictation technology, any transcriptional errors that may result from process are unintentional.    Contact & Pharmacy Preferred: [phone] Home: [phone] (home) Mobile: [phone] (mobile) E-mail: [email or web address]  [facility] - [place], [place] - [address] [place] [address] Phone: [phone] Fax: [phone]  [id number] - [place], [place] - [address] [place] [address] Phone: [phone] Fax: [phone]   Pre-screening  Ms. Cavan offered "in-person" vs "virtual" encounter. She indicated preferring virtual for this encounter.   Reason COVID-19*  Social distancing based on CDC and AMA recommendations.   I contacted Malgorzata Pica on 09/15/2020 via telephone.      I clearly identified myself as Francisco A Naveira, MD. I verified that I was speaking with the correct person using two identifiers (Name: Chyanne Cothran, and date of birth: 12/19/1970).  Consent I sought verbal advanced consent from Rhilee Heacox for virtual visit interactions. I informed Ms. Broyhill of possible security and privacy concerns, risks, and limitations associated with providing "not-in-person"  medical evaluation and management services. I also informed Ms. Cumbie of the availability of "in-person" appointments. Finally, I informed her that there would be a charge for the virtual visit and that she could be  personally, fully or partially, financially responsible for it. Ms. Peron expressed understanding and agreed to proceed.   Historic Elements   Ms. Craig Huntsberry is a 49 y.o. year old, female patient evaluated today after our last contact on 08/18/2020. Ms. Ruggieri  has a past medical history of Acute postoperative pain (04/30/2019), Diabetes mellitus without complication (HCC), GERD (gastroesophageal reflux disease), Headache, MS (multiple sclerosis) (HCC), Numbness and tingling of both legs below knees, Osteoarthritis, Vertigo, and Wears dentures. She also  has a past surgical history that includes Abdominal hysterectomy; Cesarean section; Tumor removal (Left); Cataract extraction w/PHACO (Right, 03/18/2019); and Cataract extraction w/PHACO (Left, 04/13/2019). Ms. Cefalu has a current medication list which includes the following prescription(s): aspirin-acetaminophen-caffeine, baclofen, dimethyl fumarate, duloxetine, hydroxyzine, lamotrigine, levetiracetam, loperamide, meloxicam, metformin, mirabegron er, omeprazole, oxycodone, oxycodone, [START ON 10/15/2020] oxycodone, pregabalin, primidone, propranolol, and sitagliptin. She  reports that she quit smoking about 3 years ago. Her smoking use included cigarettes. She has a 9.00 pack-year smoking history. She has quit using smokeless tobacco. She reports that she does not drink alcohol. No history on file for drug use. Ms. Fillingim is allergic to 5-alpha reductase inhibitors, glatiramer, rebif [interferon beta-1a], and latex.   HPI  Today, she is being contacted for follow-up evaluation after the MRI of the lumbar spine.  However, the patient has not had that MRI done yet.  Is scheduled for November 18.  She indicates that  she is having a lot more pain than usual and this pain seems to be worse in the lower back than the lower extremities.  In the case of   the lower back her pain is bilateral with some pain in the midline but both sides are just as bad.  A prior MRI that she had in 2018 already had shown lumbar facet arthropathy at the L4-5 and L5-S1 levels with degenerative disc disease of those levels as well.  Unfortunately, the patient has also gained additional weight and she has had several falls, none of which have contributed positively towards improving her low back pain.  In the case of the lower extremity pain, this is bilateral with the right being worse than the left.  In the case of the right lower extremity pain this goes all the way down to the ankle running through the back of the leg, suggesting a referred pain from the lumbar facet joints.  However this is hard to differentiate from a radiculopathy since the patient has bilateral foot numbness.  She has already had II nerve conduction test, 1 done 12 years ago and the other one 7 to 8 years ago where she was found to have this peripheral neuropathy.  She has diabetes and she is likely to have a component of a diabetic peripheral neuropathy, but she refers that the neuropathy that she was diagnosed with, she was under the understanding that it had to do with her multiple sclerosis.  In terms of the numbness in her feet it is through the entire foot and affecting all toes, suggestive of a true peripheral neuropathy versus a multilevel lumbar radiculopathy.  This is the primary reason why we have order that lumbar MRI to compare with the one done in 2018 to determine if there has been worsening of the area or if there is any other conditions that would explain the patient's current symptoms.  The patient also refers that the pain in the left lower extremity goes down the posterior aspect of the leg down to the knee, but does not go any further down.  At this point, I  have recommended that she work on losing the weight that she has gained and to try to work towards having a BMI of 30 or less.  I have also requested that as soon as she has her MRI to give [place] call so that we can set up a virtual appointment to go over the results of that MRI.  She understood the plan and accepted.  Pharmacotherapy Assessment  Analgesic: Oxycodone IR 5 mg, 1 tab PO BID (10 mg/day of oxycodone)  MME/day:58m/day.   Monitoring: [place] PMP: PDMP reviewed during this encounter.       Pharmacotherapy: No side-effects or adverse reactions reported. Compliance: No problems identified. Effectiveness: Clinically acceptable. Plan: Refer to "POC".  UDS:  Summary  Date Value Ref Range Status  11/27/2018 FINAL  Final    Comment:    ==================================================================== [facility] COMP DRUG ANALYSIS,UR ==================================================================== Test                             Result       Flag       Units Drug Present and Declared for Prescription Verification   7-aminoclonazepam              319          EXPECTED   ng/mg creat    7-aminoclonazepam is an expected metabolite of clonazepam. Source    of clonazepam is a scheduled prescription medication.   Primidone  PRESENT      EXPECTED   Phenobarbital                  PRESENT      EXPECTED    Phenobarbital is an expected metabolite of primidone;    Phenobarbital may also be administered as a prescription drug.   Gabapentin                     PRESENT      EXPECTED   Lamotrigine                    PRESENT      EXPECTED   Baclofen                       PRESENT      EXPECTED   Duloxetine                     PRESENT      EXPECTED   Propranolol                    PRESENT      EXPECTED Drug Present not Declared for Prescription Verification   Metaxalone                     PRESENT      UNEXPECTED   Acetaminophen                  PRESENT      UNEXPECTED    Diphenhydramine                PRESENT      UNEXPECTED ==================================================================== Test                      Result    Flag   Units      Ref Range   Creatinine              47               mg/dL      >=20 ==================================================================== Declared Medications:  The flagging and interpretation on this report are based on the  following declared medications.  Unexpected results may arise from  inaccuracies in the declared medications.  **Note: The testing scope of this panel includes these medications:  Baclofen (Lioresal)  Clonazepam (Klonopin)  Duloxetine (Cymbalta)  Gabapentin (Neurontin)  Lamotrigine (Lamictal)  Primidone (Mysoline)  Propranolol (Inderal)  **Note: The testing scope of this panel does not include following  reported medications:  Dimethyl Fumarate  Metformin (Glucophage)  Mirabegron (Myrbetriq)  Omeprazole (Prilosec)  Sitagliptin (Januvia) ==================================================================== For clinical consultation, please call [phone]. ====================================================================     Laboratory Chemistry Profile   Renal Lab Results  Component Value Date   BUN 13 09/25/2019   CREATININE 0.53 09/25/2019   BCR 13 11/27/2018   GFRAA >60 09/25/2019   GFRNONAA >60 09/25/2019     Hepatic Lab Results  Component Value Date   AST 17 11/27/2018   ALT 35 08/18/2018   ALBUMIN 4.1 11/27/2018   ALKPHOS 91 11/27/2018     Electrolytes Lab Results  Component Value Date   NA 136 09/25/2019   K 3.8 09/25/2019   CL 101 09/25/2019   CALCIUM 8.8 (L) 09/25/2019   MG 2.2 11/27/2018     Bone Lab Results  Component Value Date   25OHVITD1 55 11/27/2018  25OHVITD2 <1.0 11/27/2018   25OHVITD3 55 11/27/2018     Inflammation (CRP: Acute Phase) (ESR: Chronic Phase) Lab Results  Component Value Date   CRP 10 11/27/2018   ESRSEDRATE 42  (H) 11/27/2018       Note: Above Lab results reviewed.  Imaging  DG PAIN CLINIC C-ARM 1-60 MIN NO REPORT Fluoro was used, but no Radiologist interpretation will be provided.  Please refer to "NOTES" tab for provider progress note.  Assessment  There were no encounter diagnoses.  Plan of Care  Problem-specific:  No problem-specific Assessment & Plan notes found for this encounter.  Ms. Quinnetta Roepke has a current medication list which includes the following long-term medication(s): baclofen, duloxetine, lamotrigine, levetiracetam, meloxicam, metformin, omeprazole, oxycodone, oxycodone, [START ON 10/15/2020] oxycodone, pregabalin, primidone, propranolol, and sitagliptin.  Pharmacotherapy (Medications Ordered): No orders of the defined types were placed in this encounter.  Orders:  No orders of the defined types were placed in this encounter.  Follow-up plan:   Return for (VV), (s/p Tests) to evaluate the results of her MRI.      Interventional management options: Considering:   NOTE: NO RFA until BMI is <30 Continue encouraging the patient to bring her BMI below 35. Diagnostic bilateral T11-12 lumbar facet block   Diagnostic right T7-8 TESI   Diagnostic right T7 TFESI   Diagnostic ML CESI   Possible candidate for intrathecal pump trial and implant    Palliative PRN treatment(s):   Palliative bilateral lumbar facet block #4  Palliative left lumbar facet RFA #4 (last done on 05/24/2020)  Palliative right lumbar facet RFA #4 (last done on 04/07/2020)     Recent Visits Date Type Provider Dept  09/01/20 Telemedicine Milinda Pointer, [facility]  08/18/20 Procedure visit Milinda Pointer, MD [facility]  08/10/20 Office Visit Milinda Pointer, MD [facility]  07/18/20 Telemedicine Milinda Pointer, MD [facility]  Showing recent visits within past 90 days and meeting all other requirements Today's Visits Date Type  Provider Dept  09/15/20 Telemedicine Milinda Pointer, MD [facility]  Showing today's visits and meeting all other requirements Future Appointments Date Type Provider Dept  11/09/20 Appointment Milinda Pointer, MD [facility]  Showing future appointments within next 90 days and meeting all other requirements  I discussed the assessment and treatment plan with the patient. The patient was provided an opportunity to ask questions and all were answered. The patient agreed with the plan and demonstrated an understanding of the instructions.  Patient advised to call back or seek an in-person evaluation if the symptoms or condition worsens.  Duration of encounter: 18 minutes.  Note by: [name], MD Date: 09/15/2020; Time: 4:53 PM

## 2020-09-15 ENCOUNTER — Other Ambulatory Visit: Payer: Self-pay

## 2020-09-15 ENCOUNTER — Ambulatory Visit: Payer: Medicaid Other | Attending: Pain Medicine | Admitting: Pain Medicine

## 2020-09-15 DIAGNOSIS — M47816 Spondylosis without myelopathy or radiculopathy, lumbar region: Secondary | ICD-10-CM | POA: Diagnosis not present

## 2020-09-15 DIAGNOSIS — M545 Low back pain, unspecified: Secondary | ICD-10-CM

## 2020-09-22 ENCOUNTER — Institutional Professional Consult (permissible substitution): Payer: Medicaid Other | Admitting: Plastic Surgery

## 2020-09-22 ENCOUNTER — Other Ambulatory Visit: Payer: Self-pay

## 2020-09-22 ENCOUNTER — Ambulatory Visit
Admission: RE | Admit: 2020-09-22 | Discharge: 2020-09-22 | Disposition: A | Discharge status: Home / Self Care | Payer: Medicaid Other | Source: Ambulatory Visit | Attending: Pain Medicine | Admitting: Pain Medicine

## 2020-09-22 DIAGNOSIS — M47816 Spondylosis without myelopathy or radiculopathy, lumbar region: Secondary | ICD-10-CM

## 2020-09-22 DIAGNOSIS — M545 Low back pain, unspecified: Secondary | ICD-10-CM

## 2020-09-22 DIAGNOSIS — R937 Abnormal findings on diagnostic imaging of other parts of musculoskeletal system: Secondary | ICD-10-CM | POA: Diagnosis present

## 2020-09-22 DIAGNOSIS — G8929 Other chronic pain: Secondary | ICD-10-CM | POA: Insufficient documentation

## 2020-09-22 DIAGNOSIS — M51369 Other intervertebral disc degeneration, lumbar region without mention of lumbar back pain or lower extremity pain: Secondary | ICD-10-CM

## 2020-09-22 DIAGNOSIS — M5136 Other intervertebral disc degeneration, lumbar region: Secondary | ICD-10-CM | POA: Insufficient documentation

## 2020-09-22 DIAGNOSIS — M51379 Other intervertebral disc degeneration, lumbosacral region without mention of lumbar back pain or lower extremity pain: Secondary | ICD-10-CM

## 2020-09-22 DIAGNOSIS — M5137 Other intervertebral disc degeneration, lumbosacral region: Secondary | ICD-10-CM | POA: Diagnosis present

## 2020-10-12 ENCOUNTER — Institutional Professional Consult (permissible substitution): Payer: Medicaid Other | Admitting: Plastic Surgery

## 2020-11-06 ENCOUNTER — Other Ambulatory Visit: Payer: Self-pay

## 2020-11-06 ENCOUNTER — Emergency Department
Admission: EM | Admit: 2020-11-06 | Discharge: 2020-11-07 | Disposition: A | Discharge status: Home / Self Care | Payer: Medicaid Other | Attending: Emergency Medicine | Admitting: Emergency Medicine

## 2020-11-06 ENCOUNTER — Encounter: Payer: Self-pay | Admitting: Emergency Medicine

## 2020-11-06 DIAGNOSIS — R296 Repeated falls: Secondary | ICD-10-CM | POA: Insufficient documentation

## 2020-11-06 DIAGNOSIS — E1140 Type 2 diabetes mellitus with diabetic neuropathy, unspecified: Secondary | ICD-10-CM | POA: Diagnosis not present

## 2020-11-06 DIAGNOSIS — Z7982 Long term (current) use of aspirin: Secondary | ICD-10-CM | POA: Diagnosis not present

## 2020-11-06 DIAGNOSIS — Z87891 Personal history of nicotine dependence: Secondary | ICD-10-CM | POA: Diagnosis not present

## 2020-11-06 DIAGNOSIS — R600 Localized edema: Secondary | ICD-10-CM

## 2020-11-06 DIAGNOSIS — Z91040 Latex allergy status: Secondary | ICD-10-CM | POA: Diagnosis not present

## 2020-11-06 DIAGNOSIS — M7989 Other specified soft tissue disorders: Secondary | ICD-10-CM

## 2020-11-06 DIAGNOSIS — Z7984 Long term (current) use of oral hypoglycemic drugs: Secondary | ICD-10-CM | POA: Insufficient documentation

## 2020-11-06 LAB — COMPREHENSIVE METABOLIC PANEL
ALT: 18 U/L (ref 0–44)
AST: 29 U/L (ref 15–41)
Albumin: 2.8 g/dL — ABNORMAL LOW (ref 3.5–5.0)
Alkaline Phosphatase: 101 U/L (ref 38–126)
Anion gap: 8 (ref 5–15)
BUN: 7 mg/dL (ref 6–20)
CO2: 29 mmol/L (ref 22–32)
Calcium: 8.7 mg/dL — ABNORMAL LOW (ref 8.9–10.3)
Chloride: 101 mmol/L (ref 98–111)
Creatinine, Ser: 0.61 mg/dL (ref 0.44–1.00)
GFR, Estimated: >60 mL/min (ref >60)
Glucose, Bld: 229 mg/dL — ABNORMAL HIGH (ref 70–99)
Potassium: 3.8 mmol/L (ref 3.5–5.1)
Sodium: 138 mmol/L (ref 135–145)
Total Bilirubin: 0.3 mg/dL (ref 0.3–1.2)
Total Protein: 7.1 g/dL (ref 6.5–8.1)

## 2020-11-06 LAB — CBC WITH DIFFERENTIAL/PLATELET
Abs Immature Granulocytes: 0.04 10*3/uL (ref 0.00–0.07)
Basophils Absolute: 0.1 10*3/uL (ref 0.0–0.1)
Basophils Relative: 1 %
Eosinophils Absolute: 0.2 10*3/uL (ref 0.0–0.5)
Eosinophils Relative: 4 %
HCT: 34.3 % — ABNORMAL LOW (ref 36.0–46.0)
Hemoglobin: 10.7 g/dL — ABNORMAL LOW (ref 12.0–15.0)
Immature Granulocytes: 1 %
Lymphocytes Relative: 12 %
Lymphs Abs: 0.8 10*3/uL (ref 0.7–4.0)
MCH: 25 pg — ABNORMAL LOW (ref 26.0–34.0)
MCHC: 31.2 g/dL (ref 30.0–36.0)
MCV: 80.1 fL (ref 80.0–100.0)
Monocytes Absolute: 0.7 10*3/uL (ref 0.1–1.0)
Monocytes Relative: 10 %
Neutro Abs: 4.9 10*3/uL (ref 1.7–7.7)
Neutrophils Relative %: 72 %
Platelets: 284 10*3/uL (ref 150–400)
RBC: 4.28 MIL/uL (ref 3.87–5.11)
RDW: 17.5 % — ABNORMAL HIGH (ref 11.5–15.5)
WBC: 6.7 10*3/uL (ref 4.0–10.5)
nRBC: 0 % (ref 0.0–0.2)

## 2020-11-06 LAB — BRAIN NATRIURETIC PEPTIDE: B Natriuretic Peptide: 81.1 pg/mL (ref 0.0–100.0)

## 2020-11-06 NOTE — ED Notes (Signed)
Patient moved to room, patient requested to sit in chair. Edema on bilateral LE noted. No pitting on L leg, +1 pitting edema on R leg. NAD noted. 5/10 pain legs, "aching in knees all the way to toes".

## 2020-11-06 NOTE — ED Triage Notes (Signed)
Pt to ED via POV stating that she noticed that 2 days ago she started having bilateral LE edema. Pt states that she has been keeping her legs evaluated but it is not helping, pt states that the swelling does not go down at night. Pt denies hx/o CHF.

## 2020-11-06 NOTE — ED Provider Notes (Signed)
[facility] Emergency Department Provider Note ____________________________________________   Event Date/Time   First MD Initiated Contact with Patient 11/06/20 2340     (approximate)  I have reviewed the triage vital signs and the nursing notes.  HISTORY  Chief Complaint Leg Swelling   HPI Shannon Benitez is a 50 y.o. femalewho presents to the ED for evaluation of bilateral leg swelling.  Chart review indicates history of DM on multiple oral agents, multiple sclerosis followed by neurology, chronic pain syndrome on opiates.  No history of CAD or CHF.  Takes no diuretics. Self-reports a history of acute PE that occurred about 3-5 years ago, in the setting of pneumonia, for which she finished a course of anticoagulation and has had no further clots.  Patient presents to the ED with about 2 days of bilateral leg swelling with associated aching pain.  She does report frequent falls for the past 6 months, and this reports a fall that occurred about 5 - 6 days ago where she stumbled and caught herself with her hands.  Denies head trauma or syncope.  She reports diffuse swelling to her bilateral legs beneath her knees with associated 5/10 aching discomfort that is nonradiating.  Denies fevers, direct injury or trauma to her legs, orthopnea, cough, chest pain, pleuritic pains, shortness of breath.  Reports her whole family had Covid about a month ago, she got over this as an outpatient.  She reports newfound fear of the virus and asks some questions about the vaccine, and is uncertain if she is able to take the vaccine in the setting of her multiple sclerosis.  We discussed this and I recommended it.    Past Medical History:  Diagnosis Date  . Acute postoperative pain 04/30/2019  . Diabetes mellitus without complication ([place])   . GERD (gastroesophageal reflux disease)   . Headache    daily  . MS (multiple sclerosis) ([place])   . Numbness and tingling of both legs  below knees    pt reports secondary to MS  . Osteoarthritis    lumbar  . Vertigo    last episode opver 18 yrs ago  . Wears dentures    full upper and lower    Patient Active Problem List   Diagnosis Date Noted  . Other spondylosis, sacral and sacrococcygeal region 08/18/2020  . 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 Scapulalgia (Left) 04/20/2020  . Traumatic injury of scapula (Left) 04/20/2020  . Chronic diarrhea 11/17/2019  . Iron deficiency anemia 11/17/2019  . Overactive bladder 11/17/2019  . Vitamin B 12 deficiency 11/17/2019  . 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) 08/17/2019  . Abnormal MRI, cervical spine (07/22/2019) 08/17/2019  . Acute postoperative pain 04/30/2019  . Diabetic peripheral neuropathy ([place]) 02/23/2019  . Morbid obesity with BMI of 40.0-44.9, adult ([place]) 01/19/2019  . Spondylosis without myelopathy or radiculopathy, lumbosacral region 01/01/2019  . Lumbar facet syndrome (Bilateral) (R>L) 01/01/2019  . Abnormal MRI, lumbar spine (07/02/2017) 01/01/2019  . Lumbar facet hypertrophy (Multilevel) (Bilateral) 01/01/2019  . Osteoarthritis of facet joint of lumbar spine 01/01/2019  . Osteoarthritis of lumbar spine 01/01/2019  . DDD (degenerative disc disease), lumbosacral 01/01/2019  . Lumbar spondylosis 01/01/2019  . Chronic low back pain (Bilateral) (L>R) w/o sciatica 12/17/2018  . Muscle spasticity 12/17/2018  . Chronic pain syndrome 11/27/2018  . Chronic low back pain (1ry area of Pain) (Bilateral) (L>R) w/ sciatica (Bilateral) 11/27/2018  . Chronic lower extremity pain (2ry area of Pain) (Bilateral) (R>L) 11/27/2018  . Long  term prescription benzodiazepine use 11/27/2018  . Pharmacologic therapy 11/27/2018  . Disorder of skeletal system 11/27/2018  . Problems influencing health status 11/27/2018  . Anemia 05/19/2018  . Diabetes mellitus type 2 in obese ([place]) 02/19/2018  . Anxiety 01/14/2018  . Situational depression 01/14/2018  . Neurogenic pain  01/14/2018  . Vitamin D deficiency 01/14/2018  . Medication management 06/10/2017  . Other long term (current) drug therapy 06/10/2017  . Chronic sacroiliac joint pain (Bilateral) (L>R) 01/30/2017  . Generalized seizure disorder ([place]) 11/23/2016  . Multiple sclerosis ([place]) 11/23/2016  . Tremor 11/23/2016    Past Surgical History:  Procedure Laterality Date  . ABDOMINAL HYSTERECTOMY    . CATARACT EXTRACTION W/PHACO Right 03/18/2019   Procedure: CATARACT EXTRACTION PHACO AND INTRAOCULAR LENS PLACEMENT ([place]) RIGHT;  Surgeon: Eulogio Bear, MD;  Location: [place];  Service: Ophthalmology;  Laterality: Right;  Diabetic - oral meds Latex sensitivity  . CATARACT EXTRACTION W/PHACO Left 04/13/2019   Procedure: CATARACT EXTRACTION PHACO AND INTRAOCULAR LENS PLACEMENT ([place])  LEFT;  Surgeon: Eulogio Bear, MD;  Location: [place];  Service: Ophthalmology;  Laterality: Left;  Diabetic - oral meds  . CESAREAN SECTION    . TUMOR REMOVAL Left    pt was 50 years old    Prior to Admission medications   Medication Sig Start Date End Date Taking? Authorizing Provider  aspirin-acetaminophen-caffeine (EXCEDRIN MIGRAINE) [phone] MG tablet Take by mouth every 6 (six) hours as needed for headache.    [provider]  baclofen (LIORESAL) 20 MG tablet Take 1 tablet (20 mg total) by mouth 4 (four) times daily. 08/16/20 11/14/20  Milinda Pointer, MD  Dimethyl Fumarate (TECFIDERA) 240 MG CPDR Take 240 mg by mouth 2 (two) times daily.  08/06/19   [provider]  DULoxetine (CYMBALTA) 60 MG capsule Take 60 mg by mouth daily.    [provider]  lamoTRIgine (LAMICTAL) 25 MG tablet Take 50 mg by mouth 2 (two) times daily. 11/23/16   [provider]  levETIRAcetam (KEPPRA) 250 MG tablet Take 500 mg by mouth 2 (two) times daily.  02/16/20 02/15/21  [provider]  loperamide (IMODIUM A-D) 2 MG tablet Take 2 mg by mouth as needed for diarrhea  or loose stools.    [provider]  meloxicam (MOBIC) 15 MG tablet Take 1 tablet (15 mg total) by mouth daily. 08/16/20 11/14/20  Milinda Pointer, MD  metFORMIN (GLUCOPHAGE-XR) 500 MG 24 hr tablet Take 250 mg by mouth daily with breakfast.  04/03/19 09/14/20  [provider]  mirabegron ER (MYRBETRIQ) 50 MG TB24 tablet Take 50 mg by mouth daily.  02/13/19   [provider]  omeprazole (PRILOSEC) 20 MG capsule Take 20 mg by mouth daily. 11/23/16 09/14/20  [provider]  oxyCODONE (OXY IR/ROXICODONE) 5 MG immediate release tablet Take 1 tablet (5 mg total) by mouth every 8 (eight) hours as needed for severe pain. Must last 30 days. 08/16/20 09/15/20  Milinda Pointer, MD  oxyCODONE (OXY IR/ROXICODONE) 5 MG immediate release tablet Take 1 tablet (5 mg total) by mouth every 8 (eight) hours as needed for severe pain. Must last 30 days. 09/15/20 10/15/20  Milinda Pointer, MD  oxyCODONE (OXY IR/ROXICODONE) 5 MG immediate release tablet Take 1 tablet (5 mg total) by mouth every 8 (eight) hours as needed for severe pain. Must last 30 days. 10/15/20 11/14/20  Milinda Pointer, MD  pregabalin (LYRICA) 150 MG capsule Take 1 capsule (150 mg total) by mouth  3 (three) times daily. 08/16/20 11/14/20  Delano Metz, MD  primidone (MYSOLINE) 50 MG tablet Take 50 mg by mouth daily. 03/06/19 09/14/20  [provider]  propranolol (INDERAL) 40 MG tablet Take 40 mg by mouth 3 (three) times daily. 12/05/17   [provider]  sitaGLIPtin (JANUVIA) 100 MG tablet Take 100 mg by mouth daily.  04/03/19 09/14/20  [provider]    Allergies 5-alpha reductase inhibitors, Glatiramer, Rebif [interferon beta-1a], and Latex  Family History  Adopted: Yes    Social History Social History   Tobacco Use  . Smoking status: Former Smoker    Packs/day: 0.50    Years: 18.00    Pack years: 9.00    Types: Cigarettes    Quit date: 12/19/2016    Years since  quitting: 3.8  . Smokeless tobacco: Former Clinical biochemist  . Vaping Use: Every day  . Start date: 02/03/2018  . Substances: Flavoring  . Devices: Smock  Substance Use Topics  . Alcohol use: Never    Review of Systems  Constitutional: No fever/chills Eyes: No visual changes. ENT: No sore throat. Cardiovascular: Denies chest pain. Respiratory: Denies shortness of breath. Gastrointestinal: No abdominal pain.  No nausea, no vomiting.  No diarrhea.  No constipation. Genitourinary: Negative for dysuria. Musculoskeletal: Negative for back pain.  Positive for atraumatic bilateral leg pain and swelling Skin: Negative for rash. Neurological: Negative for headaches, focal weakness or numbness.  ____________________________________________   PHYSICAL EXAM:  VITAL SIGNS: Vitals:   11/06/20 2114 11/07/20 0107  BP: (!) 116/59 129/66  Pulse: 73 80  Resp: 17 16  Temp:  98.6 F (37 C)  SpO2: 97% 96%     Constitutional: Alert and oriented. Well appearing and in no acute distress.  Sitting up in a bedside chair, conversational full sentences without distress.  With my assistance, she is able to ambulate from the bedside chair to the stretcher in preparation of ultrasound imaging. Eyes: Conjunctivae are normal. PERRL. EOMI. Head: Atraumatic. Nose: No congestion/rhinnorhea. Mouth/Throat: Mucous membranes are moist.  Oropharynx non-erythematous. Neck: No stridor. No cervical spine tenderness to palpation. Cardiovascular: Normal rate, regular rhythm. Grossly normal heart sounds.  Good peripheral circulation. Respiratory: Normal respiratory effort.  No retractions. Lungs CTAB. Gastrointestinal: Soft , nondistended, nontender to palpation. No CVA tenderness. Musculoskeletal:No joint effusions. No signs of acute trauma. Diffuse pitting edema symmetrically to bilateral lower extremities beneath the knees without overlying skin changes or signs of trauma.  Distally neurovascularly  intact. Neurologic:  Normal speech and language. No gross focal neurologic deficits are appreciated. No gait instability noted. Skin:  Skin is warm, dry and intact. No rash noted. Psychiatric: Mood and affect are normal. Speech and behavior are normal.  ____________________________________________   LABS (all labs ordered are listed, but only abnormal results are displayed)  Labs Reviewed  CBC WITH DIFFERENTIAL/PLATELET - Abnormal; Notable for the following components:      Result Value   Hemoglobin 10.7 (*)    HCT 34.3 (*)    MCH 25.0 (*)    RDW 17.5 (*)    All other components within normal limits  COMPREHENSIVE METABOLIC PANEL - Abnormal; Notable for the following components:   Glucose, Bld 229 (*)    Calcium 8.7 (*)    Albumin 2.8 (*)    All other components within normal limits  BRAIN NATRIURETIC PEPTIDE    ____________________________________________  RADIOLOGY  ED MD interpretation:    Official radiology report(s): US Venous Img Lower Bilateral  Result Date: 11/07/2020 CLINICAL DATA:  Bilateral lower extremity swelling EXAM: BILATERAL LOWER EXTREMITY VENOUS DOPPLER ULTRASOUND TECHNIQUE: Gray-scale sonography with compression, as well as color and duplex ultrasound, were performed to evaluate the deep venous system(s) from the level of the common femoral vein through the popliteal and proximal calf veins. COMPARISON:  None. FINDINGS: VENOUS Normal compressibility of the common femoral, superficial femoral, and popliteal veins, as well as the visualized calf veins. Visualized portions of profunda femoral vein and great saphenous vein unremarkable. No filling defects to suggest DVT on grayscale or color Doppler imaging. Doppler waveforms show normal direction of venous flow, normal respiratory plasticity and response to augmentation. Limited views of the contralateral common femoral vein are unremarkable. OTHER None. Limitations: none IMPRESSION: No lower extremity deep venous  thrombosis. Electronically Signed   By: Ulyses Jarred M.D.   On: 11/07/2020 00:47    ____________________________________________   PROCEDURES and INTERVENTIONS  Procedure(s) performed (including Critical Care):  Procedures  Medications - No data to display  ____________________________________________   MDM / ED COURSE   51 year old woman with a history of multiple sclerosis been to the ED with a couple days of bilateral leg edema, without evidence of additional acute pathology, and amenable to outpatient management.  Normal vitals on room air.  Exam with pitting edema to bilateral lower extremities without superimposed signs of trauma or skin changes.  She otherwise has no evidence of acute pathology on examination.  BNP is low and patient has no history of CHF, or pulmonary symptoms such as orthopnea.  Due to her history of DVT in the past, venous ultrasound obtained of bilateral lower extremities, and effectively rules this out.  I see no evidence of pathology to preclude outpatient management.  We discussed conservative measures for her leg edema and following up with her PCP.  Return precautions for the ED were discussed.   Clinical Course as of 11/07/20 0120  Mon Nov 07, 2020  0109 Reassessed.  Educated patient on reassuring ultrasound without evidence of DVT.  We discussed conservative measures to treat her leg swelling, such as compression stockings or Ace wrap, elevation of her legs and following up with her PCP to discuss diuretics.  We discussed return precautions for the ED. [DS]    Clinical Course User Index [DS] Vladimir Crofts, MD    ____________________________________________   FINAL CLINICAL IMPRESSION(S) / ED DIAGNOSES  Final diagnoses:  Leg edema  Leg swelling     ED Discharge Orders    None       Hennesy Sobalvarro Tamala Julian   Note:  This document was prepared using Dragon voice recognition software and may include unintentional dictation errors.   Vladimir Crofts,  MD 11/07/20 [phone]

## 2020-11-07 ENCOUNTER — Emergency Department: Payer: Medicaid Other

## 2020-11-07 NOTE — ED Notes (Signed)
ED Provider at bedside. 

## 2020-11-07 NOTE — Discharge Instructions (Signed)
As we discussed, please use compression socks or Ace wraps to help compress your legs and squeeze the fluid out of them.   Follow-up with your neurologist to discuss Covid vaccine in the setting of multiple sclerosis.  I would recommend that you get the vaccine.  Return to the ED with any fevers, passing out or difficulty breathing with your leg swelling

## 2020-11-07 NOTE — ED Notes (Signed)
This RN bedside to wrap patient's legs per EDP order. patient states "my sister is a nurse she can do it, I am impatient and want to leave".  Patient signed DC consent and son took her out to car in wheelchair.

## 2020-11-07 NOTE — ED Notes (Signed)
[place] bedside. Will take patient's VS when [place] is finished.

## 2020-11-08 DIAGNOSIS — F1120 Opioid dependence, uncomplicated: Secondary | ICD-10-CM | POA: Insufficient documentation

## 2020-11-08 NOTE — Progress Notes (Deleted)
No show to appointment.

## 2020-11-09 ENCOUNTER — Encounter: Payer: Medicaid Other | Admitting: Pain Medicine

## 2020-11-09 DIAGNOSIS — E1142 Type 2 diabetes mellitus with diabetic polyneuropathy: Secondary | ICD-10-CM

## 2020-11-09 DIAGNOSIS — F1120 Opioid dependence, uncomplicated: Secondary | ICD-10-CM

## 2020-11-09 DIAGNOSIS — G894 Chronic pain syndrome: Secondary | ICD-10-CM

## 2020-11-09 DIAGNOSIS — M47816 Spondylosis without myelopathy or radiculopathy, lumbar region: Secondary | ICD-10-CM

## 2020-11-09 DIAGNOSIS — G35 Multiple sclerosis: Secondary | ICD-10-CM

## 2020-11-09 DIAGNOSIS — G8929 Other chronic pain: Secondary | ICD-10-CM

## 2020-11-09 DIAGNOSIS — Z79899 Other long term (current) drug therapy: Secondary | ICD-10-CM

## 2020-11-24 ENCOUNTER — Telehealth: Payer: Self-pay

## 2020-11-27 NOTE — Progress Notes (Signed)
Patient: Shannon Benitez  Service Category: E/M  Provider: [name], MD  DOB: 04/07/1971  DOS: 11/28/2020  Location: Office  MRN: [id number]  Setting: Ambulatory outpatient  Referring Provider: [facility]  Type: Established Patient  Specialty: Interventional Pain Management  PCP: [facility]  Location: Remote location  Delivery: TeleHealth     Virtual Encounter - Pain Management PROVIDER NOTE: Information contained herein reflects review and annotations entered in association with encounter. Interpretation of such information and data should be left to medically-trained personnel. Information provided to patient can be located elsewhere in the medical record under "Patient Instructions". Document created using STT-dictation technology, any transcriptional errors that may result from process are unintentional.    Contact & Pharmacy Preferred: [phone] Home: [phone] (home) Mobile: [phone] (mobile) E-mail: [email or web address], [place] [address] Phone: [phone] Fax: [phone]  [id number]: [phone]: [phone]  Pre-screening  Shannon Benitez offered "in-person" vs "virtual" encounter. She indicated preferring virtual for this encounter.   Reason COVID-19*  Social distancing based on CDC and AMA recommendations.   I contacted Shannon Benitez on 11/28/2020 via telephone.      I clearly identified myself as FGaspar Cola MD. I verified that I was speaking with the correct person using two identifiers (Name: EZakara Benitez and date of birth: 828-Sep-1972.  Consent I sought verbal advanced consent from [place] Benitez virtual visit Benitez. I informed Shannon Benitez of possible security and privacy concerns, risks, and limitations associated with providing "not-in-person"  medical evaluation and management services. I also informed Shannon Benitez of the availability of "in-person" appointments. Benitez, I informed her that there would be a charge for the virtual visit and that she could be  personally, fully or partially, financially responsible for it. Ms. WSporerexpressed understanding and agreed to proceed.   Historic Elements   Ms. EAbrish Ernyis a 50y.o. year old, female patient evaluated today after our last contact on 11/09/2020. Ms. WDembeck has a past medical history of Acute postoperative pain (04/30/2019), Diabetes mellitus without complication ([place], GERD (gastroesophageal reflux disease), Headache, MS (multiple sclerosis) ([place], Numbness and tingling of both legs below knees, Osteoarthritis, Vertigo, and Wears dentures. She also  has a past surgical history that includes Abdominal hysterectomy; Cesarean section; Tumor removal (Left); Cataract extraction w/PHACO (Right, 03/18/2019); and Cataract extraction w/PHACO (Left, 04/13/2019). Ms. WProanohas a current medication list which includes the following prescription(s): aspirin-acetaminophen-caffeine, baclofen, dimethyl fumarate, duloxetine, lamotrigine, levetiracetam, loperamide, meloxicam, metformin, mirabegron er, omeprazole, pregabalin, primidone, propranolol, sitagliptin, and oxycodone. She  reports that she quit smoking about 3 years ago. Her smoking use included cigarettes. She has a 9.00 pack-year smoking history. She has quit using smokeless tobacco. She reports that she does not drink alcohol. No history on file for drug use. Ms. WEmamiis allergic to 5-alpha reductase inhibitors, glatiramer, rebif [interferon beta-1a], and latex.   HPI  Today, she is being contacted for medication management.  The patient indicates doing well with the current medication regimen. No adverse reactions or side effects reported to the medications.  She will be needing a UDS on her next face-to-face  encounter.  The patient refers that last night she slipped and fell onto her knees and hands.  She refers that when she woke up this morning she was hurting all  over, especially in the area of the lower back.  She states that she forgot she had an appointment with [place] today.  Today I will go ahead and provide her with a refill on her medication and will need to see her before 12/28/2020 for her next face-to-face medication management/monitoring encounter.  The patient indicates that she has had a flareup of her low back pain and after having given her some choices, she has decided to come in for a palliative bilateral lumbar facet block under fluoroscopic guidance and IV sedation.  We also spent quite a bit of time talking about her weight and the fact that she needs to bring it down so as to decrease her discomfort in the lower back.  I have recommended that she get together with her primary care physician to start a medically supervised diet to see if she can lose some weight.  If by any chance that she cannot bring her BMI down to 30, then she should get a bariatric surgery referral for further weight management.  RTCB: 12/28/2020 Nonopioids transfer 08/10/2020: Mobic, baclofen, and Lyrica.  Pharmacotherapy Assessment  Analgesic: Oxycodone IR 5 mg, 1 tab PO BID (10 mg/day of oxycodone)  MME/day:11m/day.   Monitoring: [place] PMP: PDMP reviewed during this encounter.       Pharmacotherapy: No side-effects or adverse reactions reported. Compliance: No problems identified. Effectiveness: Clinically acceptable. Plan: Refer to "POC".  UDS:  Summary  Date Value Ref Range Status  11/27/2018 FINAL  Final    Comment:    ==================================================================== [facility] COMP DRUG ANALYSIS,UR ==================================================================== Test                             Result       Flag       Units Drug Present and Declared for Prescription  Verification   7-aminoclonazepam              319          EXPECTED   ng/mg creat    7-aminoclonazepam is an expected metabolite of clonazepam. Source    of clonazepam is a scheduled prescription medication.   Primidone                      PRESENT      EXPECTED   Phenobarbital                  PRESENT      EXPECTED    Phenobarbital is an expected metabolite of primidone;    Phenobarbital may also be administered as a prescription drug.   Gabapentin                     PRESENT      EXPECTED   Lamotrigine                    PRESENT      EXPECTED   Baclofen                       PRESENT      EXPECTED   Duloxetine                     PRESENT      EXPECTED   Propranolol                    PRESENT  EXPECTED Drug Present not Declared for Prescription Verification   Metaxalone                     PRESENT      UNEXPECTED   Acetaminophen                  PRESENT      UNEXPECTED   Diphenhydramine                PRESENT      UNEXPECTED ==================================================================== Test                      Result    Flag   Units      Ref Range   Creatinine              47               mg/dL      >=20 ==================================================================== Declared Medications:  The flagging and interpretation on this report are based on the  following declared medications.  Unexpected results may arise from  inaccuracies in the declared medications.  **Note: The testing scope of this panel includes these medications:  Baclofen (Lioresal)  Clonazepam (Klonopin)  Duloxetine (Cymbalta)  Gabapentin (Neurontin)  Lamotrigine (Lamictal)  Primidone (Mysoline)  Propranolol (Inderal)  **Note: The testing scope of this panel does not include following  reported medications:  Dimethyl Fumarate  Metformin (Glucophage)  Mirabegron (Myrbetriq)  Omeprazole (Prilosec)  Sitagliptin (Januvia) ==================================================================== For  clinical consultation, please call [phone]. ====================================================================     Laboratory Chemistry Profile   Renal Lab Results  Component Value Date   BUN 7 11/06/2020   CREATININE 0.61 11/06/2020   BCR 13 11/27/2018   GFRAA >60 09/25/2019   GFRNONAA >60 11/06/2020     Hepatic Lab Results  Component Value Date   AST 29 11/06/2020   ALT 18 11/06/2020   ALBUMIN 2.8 (L) 11/06/2020   ALKPHOS 101 11/06/2020     Electrolytes Lab Results  Component Value Date   NA 138 11/06/2020   K 3.8 11/06/2020   CL 101 11/06/2020   CALCIUM 8.7 (L) 11/06/2020   MG 2.2 11/27/2018     Bone Lab Results  Component Value Date   25OHVITD1 55 11/27/2018   25OHVITD2 <1.0 11/27/2018   25OHVITD3 55 11/27/2018     Inflammation (CRP: Acute Phase) (ESR: Chronic Phase) Lab Results  Component Value Date   CRP 10 11/27/2018   ESRSEDRATE 42 (H) 11/27/2018       Note: Above Lab results reviewed.  Imaging  US Venous Img Lower Bilateral CLINICAL DATA:  Bilateral lower extremity swelling  EXAM: BILATERAL LOWER EXTREMITY VENOUS DOPPLER ULTRASOUND  TECHNIQUE: Gray-scale sonography with compression, as well as color and duplex ultrasound, were performed to evaluate the deep venous system(s) from the level of the common femoral vein through the popliteal and proximal calf veins.  COMPARISON:  None.  FINDINGS: VENOUS  Normal compressibility of the common femoral, superficial femoral, and popliteal veins, as well as the visualized calf veins. Visualized portions of profunda femoral vein and great saphenous vein unremarkable. No filling defects to suggest DVT on grayscale or color Doppler imaging. Doppler waveforms show normal direction of venous flow, normal respiratory plasticity and response to augmentation.  Limited views of the contralateral common femoral vein are unremarkable.  OTHER  None.  Limitations: none  IMPRESSION: No  lower extremity deep venous thrombosis.  Electronically Signed   By:  Ulyses Jarred M.D.   On: 11/07/2020 00:47  Assessment  The primary encounter diagnosis was Chronic pain syndrome. Diagnoses of Chronic low back pain (Bilateral) (L>R) w/o sciatica, Lumbar facet syndrome (Bilateral) (R>L), Chronic sacroiliac joint pain (Bilateral) (L>R), Pharmacologic therapy, and Uncomplicated opioid dependence ([place]) were also pertinent to this visit.  Plan of Care  Problem-specific:  No problem-specific Assessment & Plan notes found for this encounter.  [name] has a current medication list which includes the following long-term medication(s): baclofen, duloxetine, lamotrigine, levetiracetam, meloxicam, metformin, omeprazole, pregabalin, primidone, propranolol, sitagliptin, and oxycodone.  Pharmacotherapy (Medications Ordered): Meds ordered this encounter  Medications  . oxyCODONE (OXY IR/ROXICODONE) 5 MG immediate release tablet    Sig: Take 1 tablet (5 mg total) by mouth every 8 (eight) hours as needed for severe pain. Must last 30 days.    Dispense:  90 tablet    Refill:  0    Chronic Pain: STOP Act (Not applicable) Fill 1 day early if closed on refill date. Avoid benzodiazepines within 8 hours of opioids   Orders:  Orders Placed This Encounter  Procedures  . LUMBAR FACET(MEDIAL BRANCH NERVE BLOCK) MBNB    Standing Status:   Future    Standing Expiration Date:   12/29/2020    Scheduling Instructions:     Procedure: Lumbar facet block (AKA.: Lumbosacral medial branch nerve block)     Side: Bilateral     Level: L3-4, L4-5, & L5-S1 Facets (L2, L3, L4, L5, & S1 Medial Branch Nerves)     Sedation: Patient's choice.     Timeframe: ASAA    Order Specific Question:   Where will this procedure be performed?    Answer:   [facility]   Follow-up plan:   Return in about 30 days (around 12/28/2020) for (F2F), (Med Mgmt), in addition, Procedure (w/ sedation): (B) L-FCT BLK #5.      Interventional management options: Considering:   NOTE: NO RFA until BMI is <30 Continue encouraging the patient to bring her BMI below 35. Diagnostic bilateral T11-12 lumbar facet block   Diagnostic right T7-8 TESI   Diagnostic right T7 TFESI   Diagnostic ML CESI   Possible candidate for intrathecal pump trial and implant    Palliative PRN treatment(s):   Palliative bilateral lumbar facet block #4  Palliative left lumbar facet RFA #4 (05/24/2020)  Palliative right lumbar facet RFA #4 (04/07/2020)     Recent Visits Date Type Provider Dept  09/15/20 Telemedicine Milinda Pointer, [facility]  09/01/20 Telemedicine Milinda Pointer, MD [facility]  Showing recent visits within past 90 days and meeting all other requirements Today's Visits Date Type Provider Dept  11/28/20 Telemedicine Milinda Pointer, MD [facility]  Showing today's visits and meeting all other requirements Future Appointments No visits were found meeting these conditions. Showing future appointments within next 90 days and meeting all other requirements  I discussed the assessment and treatment plan with the patient. The patient was provided an opportunity to ask questions and all were answered. The patient agreed with the plan and demonstrated an understanding of the instructions.  Patient advised to call back or seek an in-person evaluation if the symptoms or condition worsens.  Duration of encounter: 12 minutes.  Note by: [name], MD Date: 11/28/2020; Time: 2:08 PM

## 2020-11-28 ENCOUNTER — Ambulatory Visit: Payer: Medicaid Other | Attending: Pain Medicine | Admitting: Pain Medicine

## 2020-11-28 ENCOUNTER — Other Ambulatory Visit: Payer: Self-pay

## 2020-11-28 ENCOUNTER — Encounter: Payer: Self-pay | Admitting: Pain Medicine

## 2020-11-28 DIAGNOSIS — F1120 Opioid dependence, uncomplicated: Secondary | ICD-10-CM

## 2020-11-28 DIAGNOSIS — Z79899 Other long term (current) drug therapy: Secondary | ICD-10-CM

## 2020-11-28 DIAGNOSIS — G8929 Other chronic pain: Secondary | ICD-10-CM

## 2020-11-28 DIAGNOSIS — G894 Chronic pain syndrome: Secondary | ICD-10-CM

## 2020-11-28 DIAGNOSIS — M47816 Spondylosis without myelopathy or radiculopathy, lumbar region: Secondary | ICD-10-CM

## 2020-11-28 DIAGNOSIS — M533 Sacrococcygeal disorders, not elsewhere classified: Secondary | ICD-10-CM | POA: Diagnosis not present

## 2020-11-28 DIAGNOSIS — M545 Low back pain, unspecified: Secondary | ICD-10-CM | POA: Diagnosis not present

## 2020-11-28 MED ORDER — OXYCODONE HCL 5 MG PO TABS: 5.0000 mg | ORAL_TABLET | Freq: Three times a day (TID) | ORAL | 0 refills | Status: DC | PRN

## 2020-11-28 NOTE — Patient Instructions (Addendum)
____________________________________________________________________________________________  Preparing for Procedure with Sedation  Procedure appointments are limited to planned procedures: . No Prescription Refills. . No disability issues will be discussed. . No medication changes will be discussed.  Instructions: . Oral Intake: Do not eat or drink anything for at least 8 hours prior to your procedure. (Exception: Blood Pressure Medication. See below.) . Transportation: Unless otherwise stated by your physician, you may drive yourself after the procedure. . Blood Pressure Medicine: Do not forget to take your blood pressure medicine with a sip of water the morning of the procedure. If your Diastolic (lower reading)is above 100 mmHg, elective cases will be cancelled/rescheduled. . Blood thinners: These will need to be stopped for procedures. Notify our staff if you are taking any blood thinners. Depending on which one you take, there will be specific instructions on how and when to stop it. . Diabetics on insulin: Notify the staff so that you can be scheduled 1st case in the morning. If your diabetes requires high dose insulin, take only  of your normal insulin dose the morning of the procedure and notify the staff that you have done so. . Preventing infections: Shower with an antibacterial soap the morning of your procedure. . Build-up your immune system: Take 1000 mg of Vitamin C with every meal (3 times a day) the day prior to your procedure. . Antibiotics: Inform the staff if you have a condition or reason that requires you to take antibiotics before dental procedures. . Pregnancy: If you are pregnant, call and cancel the procedure. . Sickness: If you have a cold, fever, or any active infections, call and cancel the procedure. . Arrival: You must be in the facility at least 30 minutes prior to your scheduled procedure. . Children: Do not bring children with you. . Dress appropriately:  Bring dark clothing that you would not mind if they get stained. . Valuables: Do not bring any jewelry or valuables.  Reasons to call and reschedule or cancel your procedure: (Following these recommendations will minimize the risk of a serious complication.) . Surgeries: Avoid having procedures within 2 weeks of any surgery. (Avoid for 2 weeks before or after any surgery). . Flu Shots: Avoid having procedures within 2 weeks of a flu shots or . (Avoid for 2 weeks before or after immunizations). . Barium: Avoid having a procedure within 7-10 days after having had a radiological study involving the use of radiological contrast. (Myelograms, Barium swallow or enema study). . Heart attacks: Avoid any elective procedures or surgeries for the initial 6 months after a "Myocardial Infarction" (Heart Attack). . Blood thinners: It is imperative that you stop these medications before procedures. Let us know if you if you take any blood thinner.  . Infection: Avoid procedures during or within two weeks of an infection (including chest colds or gastrointestinal problems). Symptoms associated with infections include: Localized redness, fever, chills, night sweats or profuse sweating, burning sensation when voiding, cough, congestion, stuffiness, runny nose, sore throat, diarrhea, nausea, vomiting, cold or Flu symptoms, recent or current infections. It is specially important if the infection is over the area that we intend to treat. . Heart and lung problems: Symptoms that may suggest an active cardiopulmonary problem include: cough, chest pain, breathing difficulties or shortness of breath, dizziness, ankle swelling, uncontrolled high or unusually low blood pressure, and/or palpitations. If you are experiencing any of these symptoms, cancel your procedure and contact your primary care physician for an evaluation.  Remember:  Regular Business hours are:    Monday to Thursday 8:00 AM to 4:00 PM  Provider's  Schedule: Milinda Pointer, MD:  Procedure days: Tuesday and Thursday 7:30 AM to 4:00 PM  Gillis Santa, MD:  Procedure days: Monday and Wednesday 7:30 AM to 4:00 PM ____________________________________________________________________________________________    ____________________________________________________________________________________________  Medication Rules  Purpose: To inform patients, and their family members, of our rules and regulations.  Applies to: All patients receiving prescriptions (written or electronic).  Pharmacy of record: Pharmacy where electronic prescriptions will be sent. If written prescriptions are taken to a different pharmacy, please inform the nursing staff. The pharmacy listed in the electronic medical record should be the one where you would like electronic prescriptions to be sent.  Electronic prescriptions: In compliance with the [place] (STOP) Act of 2017 (Session Lanny Cramp [id number]), effective November 05, 2018, all controlled substances must be electronically prescribed. Calling prescriptions to the pharmacy will cease to exist.  Prescription refills: Only during scheduled appointments. Applies to all prescriptions.  NOTE: The following applies primarily to controlled substances (Opioid* Pain Medications).   Type of encounter (visit): For patients receiving controlled substances, face-to-face visits are required. (Not an option or up to the patient.)  Patient's responsibilities: 1. Pain Pills: Bring all pain pills to every appointment (except for procedure appointments). 2. Pill Bottles: Bring pills in original pharmacy bottle. Always bring the newest bottle. Bring bottle, even if empty. 3. Medication refills: You are responsible for knowing and keeping track of what medications you take and those you need refilled. The day before your appointment: write a list of all prescriptions that need to be  refilled. The day of the appointment: give the list to the admitting nurse. Prescriptions will be written only during appointments. No prescriptions will be written on procedure days. If you forget a medication: it will not be "Called in", "Faxed", or "electronically sent". You will need to get another appointment to get these prescribed. No early refills. Do not call asking to have your prescription filled early. 4. Prescription Accuracy: You are responsible for carefully inspecting your prescriptions before leaving our office. Have the discharge nurse carefully go over each prescription with you, before taking them home. Make sure that your name is accurately spelled, that your address is correct. Check the name and dose of your medication to make sure it is accurate. Check the number of pills, and the written instructions to make sure they are clear and accurate. Make sure that you are given enough medication to last until your next medication refill appointment. 5. Taking Medication: Take medication as prescribed. When it comes to controlled substances, taking less pills or less frequently than prescribed is permitted and encouraged. Never take more pills than instructed. Never take medication more frequently than prescribed.  6. Inform other Doctors: Always inform, all of your healthcare providers, of all the medications you take. 7. Pain Medication from other Providers: You are not allowed to accept any additional pain medication from any other Doctor or Healthcare provider. There are two exceptions to this rule. (see below) In the event that you require additional pain medication, you are responsible for notifying us, as stated below. 8. Cough Medicine: Often these contain an opioid, such as codeine or hydrocodone. Never accept or take cough medicine containing these opioids if you are already taking an opioid* medication. The combination may cause respiratory failure and death. 9. Medication  Agreement: You are responsible for carefully reading and following our Medication Agreement. This must be signed before receiving  any prescriptions from our practice. Safely store a copy of your signed Agreement. Violations to the Agreement will result in no further prescriptions. (Additional copies of our Medication Agreement are available upon request.) 10. Laws, Rules, & Regulations: All patients are expected to follow all Federal and [employer], [employer], Rules, [employer]. Ignorance of the Laws does not constitute a valid excuse.  11. Illegal drugs and Controlled Substances: The use of illegal substances (including, but not limited to marijuana and its derivatives) and/or the illegal use of any controlled substances is strictly prohibited. Violation of this rule may result in the immediate and permanent discontinuation of any and all prescriptions being written by our practice. The use of any illegal substances is prohibited. 12. Adopted CDC guidelines & recommendations: Target dosing levels will be at or below 60 MME/day. Use of benzodiazepines** is not recommended.  Exceptions: There are only two exceptions to the rule of not receiving pain medications from other Healthcare Providers. 1. Exception #1 (Emergencies): In the event of an emergency (i.e.: accident requiring emergency care), you are allowed to receive additional pain medication. However, you are responsible for: As soon as you are able, call our office [phone], at any time of the day or night, and leave a message stating your name, the date and nature of the emergency, and the name and dose of the medication prescribed. In the event that your call is answered by a member of our staff, make sure to document and save the date, time, and the name of the person that took your information.  2. Exception #2 (Planned Surgery): In the event that you are scheduled by another doctor or dentist to have any type of surgery or procedure, you  are allowed (for a period no longer than 30 days), to receive additional pain medication, for the acute post-op pain. However, in this case, you are responsible for picking up a copy of our "Post-op Pain Management for Surgeons" handout, and giving it to your surgeon or dentist. This document is available at our office, and does not require an appointment to obtain it. Simply go to our office during business hours (Monday-Thursday from 8:00 AM to 4:00 PM) (Friday 8:00 AM to 12:00 Noon) or if you have a scheduled appointment with [place], prior to your surgery, and ask for it by name. In addition, you are responsible for: calling our office [phone], at any time of the day or night, and leaving a message stating your name, name of your surgeon, type of surgery, and date of procedure or surgery. Failure to comply with your responsibilities may result in termination of therapy involving the controlled substances.  *Opioid medications include: morphine, codeine, oxycodone, oxymorphone, hydrocodone, hydromorphone, meperidine, tramadol, tapentadol, buprenorphine, fentanyl, methadone. **Benzodiazepine medications include: diazepam (Valium), alprazolam (Xanax), clonazepam (Klonopine), lorazepam (Ativan), clorazepate (Tranxene), chlordiazepoxide (Librium), estazolam (Prosom), oxazepam (Serax), temazepam (Restoril), triazolam (Halcion) (Last updated: 10/03/2020) ____________________________________________________________________________________________   ____________________________________________________________________________________________  Medication Recommendations and Reminders  Applies to: All patients receiving prescriptions (written and/or electronic).  Medication Rules & Regulations: These rules and regulations exist for your safety and that of others. They are not flexible and neither are we. Dismissing or ignoring them will be considered "non-compliance" with medication therapy, resulting in  complete and irreversible termination of such therapy. (See document titled "Medication Rules" for more details.) In all conscience, because of safety reasons, we cannot continue providing a therapy where the patient does not follow instructions.  Pharmacy of record:   Definition: This is  pharmacy where your electronic prescriptions will be sent.   We do not endorse any particular pharmacy, however, we have experienced problems with [facility] not securing enough medication supply for the community.  We do not restrict you in your choice of pharmacy. However, once we write for your prescriptions, we will NOT be re-sending more prescriptions to fix restricted supply problems created by your pharmacy, or your insurance.   The pharmacy listed in the electronic medical record should be the one where you want electronic prescriptions to be sent.  If you choose to change pharmacy, simply notify our nursing staff.  Recommendations:  Keep all of your pain medications in a safe place, under lock and key, even if you live alone. We will NOT replace lost, stolen, or damaged medication.  After you fill your prescription, take 1 week's worth of pills and put them away in a safe place. You should keep a separate, properly labeled bottle for this purpose. The remainder should be kept in the original bottle. Use this as your primary supply, until it runs out. Once it's gone, then you know that you have 1 week's worth of medicine, and it is time to come in for a prescription refill. If you do this correctly, it is unlikely that you will ever run out of medicine.  To make sure that the above recommendation works, it is very important that you make sure your medication refill appointments are scheduled at least 1 week before you run out of medicine. To do this in an effective manner, make sure that you do not leave the office without scheduling your next medication management appointment. Always ask the nursing  staff to show you in your prescription , when your medication will be running out. Then arrange for the receptionist to get you a return appointment, at least 7 days before you run out of medicine. Do not wait until you have 1 or 2 pills left, to come in. This is very poor planning and does not take into consideration that we may need to cancel appointments due to bad weather, sickness, or emergencies affecting our staff.  DO NOT ACCEPT A "Partial Fill": If for any reason your pharmacy does not have enough pills/tablets to completely fill or refill your prescription, do not allow for a "partial fill". The law allows the pharmacy to complete that prescription within 72 hours, without requiring a new prescription. If they do not fill the rest of your prescription within those 72 hours, you will need a separate prescription to fill the remaining amount, which we will NOT provide. If the reason for the partial fill is your insurance, you will need to talk to the pharmacist about payment alternatives for the remaining tablets, but again, DO NOT ACCEPT A PARTIAL FILL, unless you can trust your pharmacist to obtain the remainder of the pills within 72 hours.  Prescription refills and/or changes in medication(s):   Prescription refills, and/or changes in dose or medication, will be conducted only during scheduled medication management appointments. (Applies to both, written and electronic prescriptions.)  No refills on procedure days. No medication will be changed or started on procedure days. No changes, adjustments, and/or refills will be conducted on a procedure day. Doing so will interfere with the diagnostic portion of the procedure.  No phone refills. No medications will be "called into the pharmacy".  No Fax refills.  No weekend refills.  No Holliday refills.  No after hours refills.  Remember:  Business hours are:    Monday to Thursday 8:00 AM to 4:00 PM Provider's Schedule: Milinda Pointer,  MD - Appointments are:  Medication management: Monday and Wednesday 8:00 AM to 4:00 PM Procedure day: Tuesday and Thursday 7:30 AM to 4:00 PM Gillis Santa, MD - Appointments are:  Medication management: Tuesday and Thursday 8:00 AM to 4:00 PM Procedure day: Monday and Wednesday 7:30 AM to 4:00 PM (Last update: 05/25/2020) ____________________________________________________________________________________________   ____________________________________________________________________________________________  Drug Holidays (Slow)  What is a "Drug Holiday"? Drug Holiday: is the name given to the period of time during which a patient stops taking a medication(s) for the purpose of eliminating tolerance to the drug.  Benefits . Improved effectiveness of opioids. . Decreased opioid dose needed to achieve benefits. . Improved pain with lesser dose.  What is tolerance? Tolerance: is the progressive decreased in effectiveness of a drug due to its repetitive use. With repetitive use, the body gets use to the medication and as a consequence, it loses its effectiveness. This is a common problem seen with opioid pain medications. As a result, a larger dose of the drug is needed to achieve the same effect that used to be obtained with a smaller dose.  How long should a "Drug Holiday" last? You should stay off of the pain medicine for at least 14 consecutive days. (2 weeks)  Should I stop the medicine "cold [place]"? No. You should always coordinate with your Pain Specialist so that he/she can provide you with the correct medication dose to make the transition as smoothly as possible.  How do I stop the medicine? Slowly. You will be instructed to decrease the daily amount of pills that you take by one (1) pill every seven (7) days. This is called a "slow downward taper" of your dose. For example: if you normally take four (4) pills per day, you will be asked to drop this dose to three (3) pills per  day for seven (7) days, then to two (2) pills per day for seven (7) days, then to one (1) per day for seven (7) days, and at the end of those last seven (7) days, this is when the "Drug Holiday" would start.   Will I have withdrawals? By doing a "slow downward taper" like this one, it is unlikely that you will experience any significant withdrawal symptoms. Typically, what triggers withdrawals is the sudden stop of a high dose opioid therapy. Withdrawals can usually be avoided by slowly decreasing the dose over a prolonged period of time. If you do not follow these instructions and decide to stop your medication abruptly, withdrawals may be possible.  What are withdrawals? Withdrawals: refers to the wide range of symptoms that occur after stopping or dramatically reducing opiate drugs after heavy and prolonged use. Withdrawal symptoms do not occur to patients that use low dose opioids, or those who take the medication sporadically. Contrary to benzodiazepine (example: Valium, Xanax, etc.) or alcohol withdrawals ("Delirium Tremens"), opioid withdrawals are not lethal. Withdrawals are the physical manifestation of the body getting rid of the excess receptors.  Expected Symptoms Early symptoms of withdrawal may include: . Agitation . Anxiety . Muscle aches . Increased tearing . Insomnia . Runny nose . Sweating . Yawning  Late symptoms of withdrawal may include: . Abdominal cramping . Diarrhea . Dilated pupils . Goose bumps . Nausea . Vomiting  Will I experience withdrawals? Due to the slow nature of the taper, it is very unlikely that you will experience any.  What is a slow taper?  Taper: refers to the gradual decrease in dose.  (Last update: 05/25/2020) ____________________________________________________________________________________________    ____________________________________________________________________________________________  CBD (cannabidiol) WARNING  Applicable  to: All individuals currently taking or considering taking CBD (cannabidiol) and, more important, all patients taking opioid analgesic controlled substances (pain medication). (Example: oxycodone; oxymorphone; hydrocodone; hydromorphone; morphine; methadone; tramadol; tapentadol; fentanyl; buprenorphine; butorphanol; dextromethorphan; meperidine; codeine; etc.)  Legal status: CBD remains a Schedule I drug prohibited for any use. CBD is illegal with one exception. In the [place], CBD has a limited Transport planner (FDA) approval for the treatment of two specific types of epilepsy disorders. Only one CBD product has been approved by the FDA for this purpose: "Epidiolex". FDA is aware that some companies are marketing products containing cannabis and cannabis-derived compounds in ways that violate the [employer], Drug and Cosmetic Act [facility] Act) and that may put the health and safety of consumers at risk. The FDA, a Federal agency, has not enforced the CBD status since 2018.   Legality: Some manufacturers ship CBD products nationally, which is illegal. Often such products are sold online and are therefore available throughout the country. CBD is openly sold in head shops and health food stores in some states where such sales have not been explicitly legalized. Selling unapproved products with unsubstantiated therapeutic claims is not only a violation of the law, but also can put patients at risk, as these products have not been proven to be safe or effective. Federal illegality makes it difficult to conduct research on CBD.  Reference: "FDA Regulation of Cannabis and Cannabis-Derived Products, Including Cannabidiol (CBD)" - [employer]  Warning: CBD is not FDA approved and has not undergo the same manufacturing controls as prescription drugs.  This means that the purity and  safety of available CBD may be questionable. Most of the time, despite manufacturer's claims, it is contaminated with THC (delta-9-tetrahydrocannabinol - the chemical in marijuana responsible for the "HIGH").  When this is the case, the [facility] contaminant will trigger a positive urine drug screen (UDS) test for Marijuana (carboxy-THC). Because a positive UDS for any illicit substance is a violation of our medication agreement, your opioid analgesics (pain medicine) may be permanently discontinued.  MORE ABOUT CBD  General Information: CBD  is a derivative of the Marijuana (cannabis sativa) plant discovered in 7. It is one of the 113 identified substances found in Marijuana. It accounts for up to 40% of the plant's extract. As of 2018, preliminary clinical studies on CBD included research for the treatment of anxiety, movement disorders, and pain. CBD is available and consumed in multiple forms, including inhalation of smoke or vapor, as an aerosol spray, and by mouth. It may be supplied as an oil containing CBD, capsules, dried cannabis, or as a liquid solution. CBD is thought not to be as psychoactive as THC (delta-9-tetrahydrocannabinol - the chemical in marijuana responsible for the "HIGH"). Studies suggest that CBD may interact with different biological target receptors in the body, including cannabinoid and other neurotransmitter receptors. As of 2018 the mechanism of action for its biological effects has not been determined.  Side-effects  Adverse reactions: Dry mouth, diarrhea, decreased appetite, fatigue, drowsiness, malaise, weakness, sleep disturbances, and others.  Drug interactions: CBC may interact with other medications such as blood-thinners. (Last update: 06/11/2020) ____________________________________________________________________________________________

## 2020-11-29 NOTE — Telephone Encounter (Signed)
Error

## 2020-12-01 ENCOUNTER — Ambulatory Visit: Payer: Medicaid Other | Admitting: Pain Medicine

## 2020-12-01 ENCOUNTER — Telehealth: Payer: Self-pay | Admitting: Pain Medicine

## 2020-12-01 NOTE — Telephone Encounter (Signed)
Patient not feeling well and needed to reschedule appt for procedure

## 2020-12-07 NOTE — Progress Notes (Deleted)
No show

## 2020-12-08 ENCOUNTER — Ambulatory Visit: Payer: Medicaid Other | Admitting: Pain Medicine

## 2020-12-27 NOTE — Progress Notes (Signed)
PROVIDER NOTE: Information contained herein reflects review and annotations entered in association with encounter. Interpretation of such information and data should be left to medically-trained personnel. Information provided to patient can be located elsewhere in the medical record under "Patient Instructions". Document created using STT-dictation technology, any transcriptional errors that may result from process are unintentional.    Patient: Shannon Benitez  Service Category: E/M  Provider: [name], MD  DOB: [birth year]  DOS: 12/28/2020  Specialty: Interventional Pain Management  MRN: [id number]  Setting: Ambulatory outpatient  PCP: [facility]  Type: Established Patient    Referring Provider: [facility]  Location: Office  Delivery: Face-to-face     HPI  [name], a 50 y.o. year old female, is here today because of her Chronic pain syndrome [G89.4]. [name] primary complain today is Knee Pain (Both knee) Last encounter: My last encounter with her was on 12/08/2020. Pertinent problems: Ms. Shave has Multiple sclerosis ([place]); Neurogenic pain; Chronic sacroiliac joint pain (Bilateral) (L>R); Chronic pain syndrome; Chronic low back pain (1ry area of Pain) (Bilateral) (L>R) w/ sciatica (Bilateral); Chronic lower extremity pain (2ry area of Pain) (Bilateral) (R>L); Chronic low back pain (Bilateral) (L>R) w/o sciatica; Muscle spasticity; Spondylosis without myelopathy or radiculopathy, lumbosacral region; Lumbar facet syndrome (Bilateral) (R>L); Abnormal MRI, lumbar spine (06/16/2018); Lumbar facet hypertrophy (Multilevel) (Bilateral); Osteoarthritis of facet joint of lumbar spine; Osteoarthritis of lumbar spine; DDD (degenerative disc disease), lumbosacral; Lumbar spondylosis; Diabetic peripheral neuropathy ([place]); 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 Pain Assessment: Severity of Chronic pain is reported as a 10-Worst pain ever/10. Location: Knee Left,Right/Denies. Onset: More than a month ago. Quality: Aching,Burning,Constant,Sharp. Timing: Constant. Modifying factor(s): meds. Vitals:  height is 5' 1"  (1.549 m) and weight is 200 lb (90.7 kg). Her temporal temperature is 97.1 F (36.2 C) (abnormal). Her blood pressure is 139/39 (abnormal) and her pulse is 85. Her respiration is 16 and oxygen saturation is 92%.   Reason for encounter: medication management.   The patient indicates NOT doing well with the current medication regimen.  She indicates that she feels the medication is not working like it used to.  (Opioid tolerance) no adverse reactions or side effects reported to the medications.  However, she thinks that it is not enough.  Her primary complaint today are those of acute on chronic bilateral low back pain with the left being worse than the right.  She is also experiencing bilateral lower extremity pain that seems to be worse on the right side when compared to the left.  When comparing the back pain to the leg pain she indicates that the worst is the back pain.  In terms of her lower extremity pain the pain going down the right lower extremity travels to the back of the leg to the ankle, not really following a clear dermatomal distribution.  In the case of the left lower extremity the pain also travels through the back of the leg to her ankle but she also has some pain in the groin area.  This last one is  likely to be secondary to hip  problems.  She also refers experiencing a "catch" in the area of the left knee that is typically followed by some pain.  Reviewing the patient's x-rays, I can see that she had a right knee x-ray last done on February 14, 2020 that showed no acute abnormalities but it did show a probable osteochondroma coming from the proximal tibial metaphysis and proximal fibular metaphysis.  Today we spent quite a bit of time talking about what I can do about this pain, her expectations, and what she also needs to do to improve this low back pain.  We had a very extensive talk about the morbid obesity and how she needs to work on bringing this down in order for her low back pain to improve.  She is having osteoarthritis involving the lumbar spine as well as her knees and hip joints.  Today I spent an inordinate amount of time with this patient counseling her on what needs to be done to control her pain.    In the past I had refered this patient to medical weight management and bariatric surgery but she cancel the medical weight management management referral and she apparently has not pursued the option of bariatric surgery.   Today she indicated that she wanted to increase her pain medicine.  She indicates that it is not working as he used to.  In talking to her, it is clear to me that she has developed tolerance to the medication therefore I have informed her what she needs to do in terms of managing this tolerance.  Today I have recommended for her to a "Drug Holiday".  I provided her with some written information regarding this.  Unfortunately, after 45 minutes of talking to her, it was very clear to me that she was having some very unrealistic expectations as she wanted me to completely eliminate her pain without her working on bring her BMI down, which is primarily the cause of the problem, not to mention her multiple sclerosis..  Regarding bariatric surgery, she kept giving me excuses as  to why she is not working on that.    Because she seems to be having an acute exacerbation of her low back pain, I have offered her a Toradol 60 mg/Norflex 60 mg IM injection today in an attempt to break the pain cycle.  I have also schedule her for a palliative bilateral lumbar facet block under fluoroscopic guidance and IV sedation.  We had previously proven that the facet joints is worse some of this low back pain is coming from and we did attempt radiofrequency ablation however, because of her morbid obesity, it is now clear to me that she is getting less than optimal benefits from that.  Today I had to explain to the patient why having an elevated BMI is troublesome when it comes to doing radiofrequency ablation of the lumbar facets since the distance between the skin and the facet joints and medial branch nerves, is significantly increased secondary to fatty tissue.  Not only that, but the fact that she has not lost any weight and in fact she seems to have gained some, leads to difficult placement of the needles as well as suboptimal results.  Despite the fact that I have provided her with all this information and informed her that we will be doing the IM injections today and that we would be putting her on the schedule for the lumbar facet blocks, she continued to ask "what else", as if there was something magical  that I could do to eliminate her pain.  I attempted as much as possible to help her understand that this pain is chronic and likely to continue for the rest of her life.  I also informed her that I can attempt helping decrease it, but it would take a joint effort to really make some changes that will significantly improve the pain.  Today I walked away from her room with a clear impression that she is probably not going to address her weight problems.  RTCB: 04/05/2021 Nonopioids transfer 08/10/2020: Mobic, baclofen, and Lyrica.  Pharmacotherapy Assessment   Analgesic: Oxycodone IR 5 mg, 1 tab  PO BID (10 mg/day of oxycodone)  MME/day:61m/day.   Monitoring: [place] PMP: PDMP reviewed during this encounter.       Pharmacotherapy: No side-effects or adverse reactions reported. Compliance: No problems identified. Effectiveness: Clinically acceptable.  BChauncey Fischer RN  12/28/2020  1:48 PM  Sign when Signing Visit Nursing Pain Medication Assessment:  Safety precautions to be maintained throughout the outpatient stay will include: orient to surroundings, keep bed in low position, maintain call bell within reach at all times, provide assistance with transfer out of bed and ambulation.  Medication Inspection Compliance: Pill count conducted under aseptic conditions, in front of the patient. Neither the pills nor the bottle was removed from the patient's sight at any time. Once count was completed pills were immediately returned to the patient in their original bottle.  Medication: Oxycodone IR Pill/Patch Count: 10 of 90 pills remain Pill/Patch Appearance: Markings consistent with prescribed medication Bottle Appearance: Standard pharmacy container. Clearly labeled. Filled Date: 02 / 1 / 22 Last Medication intake:  TodaySafety precautions to be maintained throughout the outpatient stay will include: orient to surroundings, keep bed in low position, maintain call bell within reach at all times, provide assistance with transfer out of bed and ambulation.     UDS:  Summary  Date Value Ref Range Status  11/27/2018 FINAL  Final    Comment:    ==================================================================== [facility] COMP DRUG ANALYSIS,UR ==================================================================== Test                             Result       Flag       Units Drug Present and Declared for Prescription Verification   7-aminoclonazepam              319          EXPECTED   ng/mg creat    7-aminoclonazepam is an expected metabolite of clonazepam. Source    of clonazepam is a  scheduled prescription medication.   Primidone                      PRESENT      EXPECTED   Phenobarbital                  PRESENT      EXPECTED    Phenobarbital is an expected metabolite of primidone;    Phenobarbital may also be administered as a prescription drug.   Gabapentin                     PRESENT      EXPECTED   Lamotrigine                    PRESENT      EXPECTED   Baclofen  PRESENT      EXPECTED   Duloxetine                     PRESENT      EXPECTED   Propranolol                    PRESENT      EXPECTED Drug Present not Declared for Prescription Verification   Metaxalone                     PRESENT      UNEXPECTED   Acetaminophen                  PRESENT      UNEXPECTED   Diphenhydramine                PRESENT      UNEXPECTED ==================================================================== Test                      Result    Flag   Units      Ref Range   Creatinine              47               mg/dL      >=20 ==================================================================== Declared Medications:  The flagging and interpretation on this report are based on the  following declared medications.  Unexpected results may arise from  inaccuracies in the declared medications.  **Note: The testing scope of this panel includes these medications:  Baclofen (Lioresal)  Clonazepam (Klonopin)  Duloxetine (Cymbalta)  Gabapentin (Neurontin)  Lamotrigine (Lamictal)  Primidone (Mysoline)  Propranolol (Inderal)  **Note: The testing scope of this panel does not include following  reported medications:  Dimethyl Fumarate  Metformin (Glucophage)  Mirabegron (Myrbetriq)  Omeprazole (Prilosec)  Sitagliptin (Januvia) ==================================================================== For clinical consultation, please call [phone]. ====================================================================      ROS  Constitutional: Denies any fever or  chills Gastrointestinal: No reported hemesis, hematochezia, vomiting, or acute GI distress Musculoskeletal: Denies any acute onset joint swelling, redness, loss of ROM, or weakness Neurological: No reported episodes of acute onset apraxia, aphasia, dysarthria, agnosia, amnesia, paralysis, loss of coordination, or loss of consciousness  Medication Review  DULoxetine, Dimethyl Fumarate, aspirin-acetaminophen-caffeine, baclofen, lamoTRIgine, levETIRAcetam, loperamide, meloxicam, metFORMIN, mirabegron ER, omeprazole, oxyCODONE, pregabalin, primidone, propranolol, and sitaGLIPtin  History Review  Allergy: Ms. Feigenbaum is allergic to 5-alpha reductase inhibitors, glatiramer, rebif [interferon beta-1a], and latex. Drug: Ms. Double  has no history on file for drug use. Alcohol:  reports no history of alcohol use. Tobacco:  reports that she quit smoking about 4 years ago. Her smoking use included cigarettes. She has a 9.00 pack-year smoking history. She has quit using smokeless tobacco. Social: Ms. Ault  reports that she quit smoking about 4 years ago. Her smoking use included cigarettes. She has a 9.00 pack-year smoking history. She has quit using smokeless tobacco. She reports that she does not drink alcohol. Medical:  has a past medical history of Acute postoperative pain (04/30/2019), Diabetes mellitus without complication ([place]), GERD (gastroesophageal reflux disease), Headache, MS (multiple sclerosis) ([facility]), Numbness and tingling of both legs below knees, Osteoarthritis, Vertigo, and Wears dentures. Surgical: Ms. Ayotte  has a past surgical history that includes Abdominal hysterectomy; Cesarean section; Tumor removal (Left); Cataract extraction w/PHACO (Right, 03/18/2019); and Cataract extraction w/PHACO (Left, 04/13/2019). Family: family history is not on file. She was adopted.  Laboratory  Chemistry Profile   Renal Lab Results  Component Value Date   BUN 7 11/06/2020   CREATININE  0.61 11/06/2020   BCR 13 11/27/2018   GFRAA >60 09/25/2019   GFRNONAA >60 11/06/2020     Hepatic Lab Results  Component Value Date   AST 29 11/06/2020   ALT 18 11/06/2020   ALBUMIN 2.8 (L) 11/06/2020   ALKPHOS 101 11/06/2020     Electrolytes Lab Results  Component Value Date   NA 138 11/06/2020   K 3.8 11/06/2020   CL 101 11/06/2020   CALCIUM 8.7 (L) 11/06/2020   MG 2.2 11/27/2018     Bone Lab Results  Component Value Date   25OHVITD1 55 11/27/2018   25OHVITD2 <1.0 11/27/2018   25OHVITD3 55 11/27/2018     Inflammation (CRP: Acute Phase) (ESR: Chronic Phase) Lab Results  Component Value Date   CRP 10 11/27/2018   ESRSEDRATE 42 (H) 11/27/2018       Note: Above Lab results reviewed.  Recent Imaging Review  US Venous Img Lower Bilateral CLINICAL DATA:  Bilateral lower extremity swelling  EXAM: BILATERAL LOWER EXTREMITY VENOUS DOPPLER ULTRASOUND  TECHNIQUE: Gray-scale sonography with compression, as well as color and duplex ultrasound, were performed to evaluate the deep venous system(s) from the level of the common femoral vein through the popliteal and proximal calf veins.  COMPARISON:  None.  FINDINGS: VENOUS  Normal compressibility of the common femoral, superficial femoral, and popliteal veins, as well as the visualized calf veins. Visualized portions of profunda femoral vein and great saphenous vein unremarkable. No filling defects to suggest DVT on grayscale or color Doppler imaging. Doppler waveforms show normal direction of venous flow, normal respiratory plasticity and response to augmentation.  Limited views of the contralateral common femoral vein are unremarkable.  OTHER  None.  Limitations: none  IMPRESSION: No lower extremity deep venous thrombosis.  Electronically Signed   By: Ulyses Jarred M.D.   On: 11/07/2020 00:47 Note: Reviewed        Physical Exam  General appearance: Well nourished, well developed, and well  hydrated. In no apparent acute distress Mental status: Alert, oriented x 3 (person, place, & time)       Respiratory: No evidence of acute respiratory distress Eyes: PERLA Vitals: BP (!) 139/39 (BP Location: Left Arm, Patient Position: Sitting, Cuff Size: Large)   Pulse 85   Temp (!) 97.1 F (36.2 C) (Temporal)   Resp 16   Ht 5' 1"  (1.549 m)   Wt 200 lb (90.7 kg)   SpO2 92%   BMI 37.79 kg/m  BMI: Estimated body mass index is 37.79 kg/m as calculated from the following:   Height as of this encounter: 5' 1"  (1.549 m).   Weight as of this encounter: 200 lb (90.7 kg). Ideal: Ideal body weight: 47.8 kg (105 lb 6.1 oz) Adjusted ideal body weight: 65 kg (143 lb 3.6 oz)  Assessment   Status Diagnosis  Controlled Controlled Controlled 1. Chronic pain syndrome   2. Acute exacerbation of chronic low back pain   3. Chronic low back pain (1ry area of Pain) (Bilateral) (L>R) w/ sciatica (Bilateral)   4. Lumbar facet syndrome (Bilateral) (R>L)   5. Chronic sacroiliac joint pain (Bilateral) (L>R)   6. DDD (degenerative disc disease), lumbosacral   7. 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 Chronic lower extremity pain (2ry area of Pain) (Bilateral) (R>L)   9. Chronic hip pain (Bilateral)   10. Osteoarthritis of hips (  Bilateral)   11. Chronic knee pain (Bilateral)   12. Osteoarthritis of knee (Right)   13. Osteochondroma of proximal tibia (Right)   14. Osteochondroma of proximal fibula (Right)   15. Osteoarthritis involving multiple joints   16. Abnormal MRI, cervical spine (12/27/20)   17. Abnormal MRI, thoracic spine (12/27/2020)   18. Abnormal MRI, lumbar spine (06/16/2018)   19. Multiple sclerosis ([place])   20. Diabetic peripheral neuropathy ([place])   21. Pharmacologic therapy   22. Uncomplicated opioid dependence ([place])   23. Medication management      Updated Problems: Problem  Acute Exacerbation of Chronic Low Back Pain  Chronic knee pain (Bilateral)   Osteoarthritis of knee (Right)  Osteochondroma of proximal fibula (Right)  Osteochondroma of proximal tibia (Right)  Osteoarthritis involving multiple joints  Abnormal MRI, thoracic spine (12/27/2020)   (12/27/2020) THORACIC MRI FINDINGS:  Multiple foci of patchy T2/STIR signal abnormalities are again noted in the thoracic spinal cord at T1-2, from T2 through T6, at T7-8, from T8 through T10, and at T11. No new cord lesions.  Small right paracentral disc protrusion at T7-T8 with resultant mild spinal canal stenosis at this level. Fatty atrophy of the paraspinal musculature.  IMPRESSION: Abnormalities in thoracic spinal cord, compatible with multiple sclerosis.   Chronic hip pain (Bilateral)  Osteoarthritis of hips (Bilateral)  Abnormal MRI, cervical spine (12/27/20)   (12/27/2020) CERVICAL MRI FINDINGS:  Redemonstrated lesions within the ventral aspect of the medulla and in the left lateral aspect of the cervical spine at C2.  No definite new cord lesion is visualized.  Straightening of the expected cervical lordosis. Multilevel degenerative changes of the cervical spine with small disc bulges at C2-C3, and C3-C4 without significant spinal canal stenosis. Bilateral perineural cysts at C6-C7 C7 and C7-T1.   IMPRESSION:  No new cord lesion is identified. Lesions within the ventral medulla and left lateral cord at the level of C2 are unchanged.    Abnormal MRI, lumbar spine (06/16/2018)   (06/16/2018) LUMBAR MRI FINDINGS: Multilevel disc dessication is present at L4-L5. The visualized cord is unremarkable and the conus medullaris ends at L2.  L3-4: Mild diffuse disc bulge. L4-5: Diffuse disc bulge. Small central disc protrusion. L5-S1: Diffuse disc bulge. Small central disc protrusion.   (07/02/2017) LUMBAR MRI FINDINGS:  Alignment: Mild retrolisthesis of L5 on S1. Paraspinal and other soft tissues: Negative aside from large body habitus.  Disc levels: T11-T12:  Mild facet  hypertrophy. L2-3:  Mild endplate spurring. L3-4: Mild far lateral disc bulging and endplate spurring. Mild left facet hypertrophy. L4-5: Disc desiccation. Mild mostly far lateral disc bulging. Superimposed small central to right paracentral disc protrusion with annular fissure; disc material in proximity to the descending L5 nerve roots in the lateral recesses. L5-S1: Disc desiccation. Posterior annular fissure of the disc without protrusion. Mild facet hypertrophy and endplate spurring. Mild epidural lipomatosis.  IMPRESSION: Mild lumbar disc degeneration limited to L4-L5 and L5-S1. A small disc herniation at the former is in proximity to the L5 nerve roots in the lateral recesses without associated stenosis.   Acute Postoperative Pain (Resolved)   Plan of Care  Problem-specific:  No problem-specific Assessment & Plan notes found for this encounter.  Ms. Chrystian Ressler has a current medication list which includes the following long-term medication(s): duloxetine, lamotrigine, levetiracetam, propranolol, baclofen, meloxicam, metformin, omeprazole, [START ON 01/05/2021] oxycodone, [START ON 02/04/2021] oxycodone, [START ON 03/06/2021] oxycodone, pregabalin, primidone, and sitagliptin.  Pharmacotherapy (Medications Ordered): Meds ordered this encounter  Medications  .  oxyCODONE (OXY IR/ROXICODONE) 5 MG immediate release tablet    Sig: Take 1 tablet (5 mg total) by mouth every 8 (eight) hours as needed for severe pain. Must last 30 days.    Dispense:  90 tablet    Refill:  0    Chronic Pain: STOP Act (Not applicable) Fill 1 day early if closed on refill date. Avoid benzodiazepines within 8 hours of opioids  . oxyCODONE (OXY IR/ROXICODONE) 5 MG immediate release tablet    Sig: Take 1 tablet (5 mg total) by mouth every 8 (eight) hours as needed for severe pain. Must last 30 days.    Dispense:  90 tablet    Refill:  0    Chronic Pain: STOP Act (Not applicable) Fill 1 day early if closed on  refill date. Avoid benzodiazepines within 8 hours of opioids  . oxyCODONE (OXY IR/ROXICODONE) 5 MG immediate release tablet    Sig: Take 1 tablet (5 mg total) by mouth every 8 (eight) hours as needed for severe pain. Must last 30 days.    Dispense:  90 tablet    Refill:  0    Chronic Pain: STOP Act (Not applicable) Fill 1 day early if closed on refill date. Avoid benzodiazepines within 8 hours of opioids  . ketorolac (TORADOL) injection 60 mg  . orphenadrine (NORFLEX) injection 60 mg   Orders:  Orders Placed This Encounter  Procedures  . LUMBAR FACET(MEDIAL BRANCH NERVE BLOCK) MBNB    Standing Status:   Future    Standing Expiration Date:   01/25/2021    Scheduling Instructions:     Procedure: Lumbar facet block (AKA.: Lumbosacral medial branch nerve block)     Side: Bilateral     Level: L3-4, L4-5, & L5-S1 Facets (L2, L3, L4, L5, & S1 Medial Branch Nerves)     Sedation: Patient's choice.     Timeframe: ASAA    Order Specific Question:   Where will this procedure be performed?    Answer:   [facility]  . ToxASSURE Select 13 (MW), Urine    Volume: 30 ml(s). Minimum 3 ml of urine is needed. Document temperature of fresh sample. Indications: Long term (current) use of opiate analgesic (W10.272)    Order Specific Question:   Release to patient    Answer:   Immediate   Follow-up plan:   Return in about 14 weeks (around 04/05/2021) for (F2F), (Med Mgmt).      Interventional management options: Considering:   NOTE: NO RFA until BMI is <30 Continue encouraging the patient to bring her BMI below 35. Diagnostic bilateral T11-12 lumbar facet block   Diagnostic right T7-8 TESI   Diagnostic right T7 TFESI   Diagnostic ML CESI   Possible candidate for intrathecal pump trial and implant    Palliative PRN treatment(s):   Palliative bilateral lumbar facet block #4  Palliative left lumbar facet RFA #4 (05/24/2020)  Palliative right lumbar facet RFA #4 (04/07/2020)     Recent  Visits Date Type Provider Dept  11/28/20 Telemedicine Milinda Pointer, MD [facility]  Showing recent visits within past 90 days and meeting all other requirements Today's Visits Date Type Provider Dept  12/28/20 Office Visit Milinda Pointer, MD [facility]  Showing today's visits and meeting all other requirements Future Appointments No visits were found meeting these conditions. Showing future appointments within next 90 days and meeting all other requirements  I discussed the assessment and treatment plan with the patient. The patient was  provided an opportunity to ask questions and all were answered. The patient agreed with the plan and demonstrated an understanding of the instructions.  Patient advised to call back or seek an in-person evaluation if the symptoms or condition worsens.  Duration of encounter: 30 minutes.  Note by: [name], MD Date: 12/28/2020; Time: 4:46 PM

## 2020-12-28 ENCOUNTER — Encounter: Payer: Self-pay | Admitting: Pain Medicine

## 2020-12-28 ENCOUNTER — Ambulatory Visit: Payer: Medicaid Other | Attending: Pain Medicine | Admitting: Pain Medicine

## 2020-12-28 ENCOUNTER — Other Ambulatory Visit: Payer: Self-pay

## 2020-12-28 VITALS — BP 139/39 | HR 85 | Temp 97.1°F | Resp 16 | Ht 61.0 in | Wt 200.0 lb

## 2020-12-28 DIAGNOSIS — M25562 Pain in left knee: Secondary | ICD-10-CM | POA: Diagnosis present

## 2020-12-28 DIAGNOSIS — M545 Low back pain, unspecified: Secondary | ICD-10-CM | POA: Insufficient documentation

## 2020-12-28 DIAGNOSIS — M47816 Spondylosis without myelopathy or radiculopathy, lumbar region: Secondary | ICD-10-CM | POA: Diagnosis present

## 2020-12-28 DIAGNOSIS — M533 Sacrococcygeal disorders, not elsewhere classified: Secondary | ICD-10-CM | POA: Diagnosis present

## 2020-12-28 DIAGNOSIS — M1711 Unilateral primary osteoarthritis, right knee: Secondary | ICD-10-CM | POA: Insufficient documentation

## 2020-12-28 DIAGNOSIS — M25561 Pain in right knee: Secondary | ICD-10-CM | POA: Diagnosis present

## 2020-12-28 DIAGNOSIS — F1120 Opioid dependence, uncomplicated: Secondary | ICD-10-CM | POA: Diagnosis present

## 2020-12-28 DIAGNOSIS — M25551 Pain in right hip: Secondary | ICD-10-CM | POA: Insufficient documentation

## 2020-12-28 DIAGNOSIS — M160 Bilateral primary osteoarthritis of hip: Secondary | ICD-10-CM | POA: Insufficient documentation

## 2020-12-28 DIAGNOSIS — M159 Polyosteoarthritis, unspecified: Secondary | ICD-10-CM

## 2020-12-28 DIAGNOSIS — G894 Chronic pain syndrome: Secondary | ICD-10-CM | POA: Diagnosis not present

## 2020-12-28 DIAGNOSIS — R937 Abnormal findings on diagnostic imaging of other parts of musculoskeletal system: Secondary | ICD-10-CM | POA: Insufficient documentation

## 2020-12-28 DIAGNOSIS — G8929 Other chronic pain: Secondary | ICD-10-CM | POA: Insufficient documentation

## 2020-12-28 DIAGNOSIS — M5104 Intervertebral disc disorders with myelopathy, thoracic region: Secondary | ICD-10-CM | POA: Diagnosis present

## 2020-12-28 DIAGNOSIS — M8949 Other hypertrophic osteoarthropathy, multiple sites: Secondary | ICD-10-CM | POA: Diagnosis present

## 2020-12-28 DIAGNOSIS — M5441 Lumbago with sciatica, right side: Secondary | ICD-10-CM | POA: Diagnosis present

## 2020-12-28 DIAGNOSIS — M5137 Other intervertebral disc degeneration, lumbosacral region: Secondary | ICD-10-CM | POA: Diagnosis present

## 2020-12-28 DIAGNOSIS — M25552 Pain in left hip: Secondary | ICD-10-CM | POA: Insufficient documentation

## 2020-12-28 DIAGNOSIS — D1621 Benign neoplasm of long bones of right lower limb: Secondary | ICD-10-CM | POA: Diagnosis present

## 2020-12-28 DIAGNOSIS — M79605 Pain in left leg: Secondary | ICD-10-CM | POA: Diagnosis present

## 2020-12-28 DIAGNOSIS — M5442 Lumbago with sciatica, left side: Secondary | ICD-10-CM | POA: Insufficient documentation

## 2020-12-28 DIAGNOSIS — G35 Multiple sclerosis: Secondary | ICD-10-CM | POA: Insufficient documentation

## 2020-12-28 DIAGNOSIS — Z79899 Other long term (current) drug therapy: Secondary | ICD-10-CM | POA: Insufficient documentation

## 2020-12-28 DIAGNOSIS — M79604 Pain in right leg: Secondary | ICD-10-CM | POA: Diagnosis present

## 2020-12-28 DIAGNOSIS — E1142 Type 2 diabetes mellitus with diabetic polyneuropathy: Secondary | ICD-10-CM | POA: Insufficient documentation

## 2020-12-28 MED ORDER — OXYCODONE HCL 5 MG PO TABS
5.0000 mg | ORAL_TABLET | Freq: Three times a day (TID) | ORAL | 0 refills | Status: DC | PRN
Start: 2021-01-05 — End: 2021-04-05

## 2020-12-28 MED ORDER — OXYCODONE HCL 5 MG PO TABS: 5.0000 mg | ORAL_TABLET | Freq: Three times a day (TID) | ORAL | 0 refills | Status: DC | PRN

## 2020-12-28 MED ORDER — KETOROLAC TROMETHAMINE 60 MG/2ML IM SOLN
60.0000 mg | Freq: Once | INTRAMUSCULAR | Status: AC
  Administered 2020-12-28: 60 mg via INTRAMUSCULAR
  Filled 2020-12-28: qty 2

## 2020-12-28 MED ORDER — ORPHENADRINE CITRATE 30 MG/ML IJ SOLN
60.0000 mg | Freq: Once | INTRAMUSCULAR | Status: AC
  Administered 2020-12-28: 60 mg via INTRAMUSCULAR
  Filled 2020-12-28: qty 2

## 2020-12-28 NOTE — Patient Instructions (Addendum)
____________________________________________________________________________________________  Medication Rules  Purpose: To inform patients, and their family members, of our rules and regulations.  Applies to: All patients receiving prescriptions (written or electronic).  Pharmacy of record: Pharmacy where electronic prescriptions will be sent. If written prescriptions are taken to a different pharmacy, please inform the nursing staff. The pharmacy listed in the electronic medical record should be the one where you would like electronic prescriptions to be sent.  Electronic prescriptions: In compliance with the [place] (STOP) Act of 2017 (Session Lanny Cramp [phone]), effective November 05, 2018, all controlled substances must be electronically prescribed. Calling prescriptions to the pharmacy will cease to exist.  Prescription refills: Only during scheduled appointments. Applies to all prescriptions.  NOTE: The following applies primarily to controlled substances (Opioid* Pain Medications).   Type of encounter (visit): For patients receiving controlled substances, face-to-face visits are required. (Not an option or up to the patient.)  Patient's responsibilities: 1. Pain Pills: Bring all pain pills to every appointment (except for procedure appointments). 2. Pill Bottles: Bring pills in original pharmacy bottle. Always bring the newest bottle. Bring bottle, even if empty. 3. Medication refills: You are responsible for knowing and keeping track of what medications you take and those you need refilled. The day before your appointment: write a list of all prescriptions that need to be refilled. The day of the appointment: give the list to the admitting nurse. Prescriptions will be written only during appointments. No prescriptions will be written on procedure days. If you forget a medication: it will not be "Called in", "Faxed", or "electronically sent".  You will need to get another appointment to get these prescribed. No early refills. Do not call asking to have your prescription filled early. 4. Prescription Accuracy: You are responsible for carefully inspecting your prescriptions before leaving our office. Have the discharge nurse carefully go over each prescription with you, before taking them home. Make sure that your name is accurately spelled, that your address is correct. Check the name and dose of your medication to make sure it is accurate. Check the number of pills, and the written instructions to make sure they are clear and accurate. Make sure that you are given enough medication to last until your next medication refill appointment. 5. Taking Medication: Take medication as prescribed. When it comes to controlled substances, taking less pills or less frequently than prescribed is permitted and encouraged. Never take more pills than instructed. Never take medication more frequently than prescribed.  6. Inform other Doctors: Always inform, all of your healthcare providers, of all the medications you take. 7. Pain Medication from other Providers: You are not allowed to accept any additional pain medication from any other Doctor or Healthcare provider. There are two exceptions to this rule. (see below) In the event that you require additional pain medication, you are responsible for notifying us, as stated below. 8. Cough Medicine: Often these contain an opioid, such as codeine or hydrocodone. Never accept or take cough medicine containing these opioids if you are already taking an opioid* medication. The combination may cause respiratory failure and death. 9. Medication Agreement: You are responsible for carefully reading and following our Medication Agreement. This must be signed before receiving any prescriptions from our practice. Safely store a copy of your signed Agreement. Violations to the Agreement will result in no further prescriptions.  (Additional copies of our Medication Agreement are available upon request.) 10. Laws, Rules, & Regulations: All patients are expected to follow all  Federal and State Laws, Statutes, Rules, & Regulations. Ignorance of the Laws does not constitute a valid excuse.  11. Illegal drugs and Controlled Substances: The use of illegal substances (including, but not limited to marijuana and its derivatives) and/or the illegal use of any controlled substances is strictly prohibited. Violation of this rule may result in the immediate and permanent discontinuation of any and all prescriptions being written by our practice. The use of any illegal substances is prohibited. 12. Adopted CDC guidelines & recommendations: Target dosing levels will be at or below 60 MME/day. Use of benzodiazepines** is not recommended.  Exceptions: There are only two exceptions to the rule of not receiving pain medications from other Healthcare Providers. 1. Exception #1 (Emergencies): In the event of an emergency (i.e.: accident requiring emergency care), you are allowed to receive additional pain medication. However, you are responsible for: As soon as you are able, call our office [phone], at any time of the day or night, and leave a message stating your name, the date and nature of the emergency, and the name and dose of the medication prescribed. In the event that your call is answered by a member of our staff, make sure to document and save the date, time, and the name of the person that took your information.  2. Exception #2 (Planned Surgery): In the event that you are scheduled by another doctor or dentist to have any type of surgery or procedure, you are allowed (for a period no longer than 30 days), to receive additional pain medication, for the acute post-op pain. However, in this case, you are responsible for picking up a copy of our "Post-op Pain Management for Surgeons" handout, and giving it to your surgeon or dentist. This  document is available at our office, and does not require an appointment to obtain it. Simply go to our office during business hours (Monday-Thursday from 8:00 AM to 4:00 PM) (Friday 8:00 AM to 12:00 Noon) or if you have a scheduled appointment with us, prior to your surgery, and ask for it by name. In addition, you are responsible for: calling our office [phone], at any time of the day or night, and leaving a message stating your name, name of your surgeon, type of surgery, and date of procedure or surgery. Failure to comply with your responsibilities may result in termination of therapy involving the controlled substances.  *Opioid medications include: morphine, codeine, oxycodone, oxymorphone, hydrocodone, hydromorphone, meperidine, tramadol, tapentadol, buprenorphine, fentanyl, methadone. **Benzodiazepine medications include: diazepam (Valium), alprazolam (Xanax), clonazepam (Klonopine), lorazepam (Ativan), clorazepate (Tranxene), chlordiazepoxide (Librium), estazolam (Prosom), oxazepam (Serax), temazepam (Restoril), triazolam (Halcion) (Last updated: 10/03/2020) ____________________________________________________________________________________________   ____________________________________________________________________________________________  Medication Recommendations and Reminders  Applies to: All patients receiving prescriptions (written and/or electronic).  Medication Rules & Regulations: These rules and regulations exist for your safety and that of others. They are not flexible and neither are we. Dismissing or ignoring them will be considered "non-compliance" with medication therapy, resulting in complete and irreversible termination of such therapy. (See document titled "Medication Rules" for more details.) In all conscience, because of safety reasons, we cannot continue providing a therapy where the patient does not follow instructions.  Pharmacy of record:   Definition:  This is the pharmacy where your electronic prescriptions will be sent.   We do not endorse any particular pharmacy, however, we have experienced problems with [facility] not securing enough medication supply for the community.  We do not restrict you in your choice of pharmacy. However,   once we write for your prescriptions, we will NOT be re-sending more prescriptions to fix restricted supply problems created by your pharmacy, or your insurance.   The pharmacy listed in the electronic medical record should be the one where you want electronic prescriptions to be sent.  If you choose to change pharmacy, simply notify our nursing staff.  Recommendations:  Keep all of your pain medications in a safe place, under lock and key, even if you live alone. We will NOT replace lost, stolen, or damaged medication.  After you fill your prescription, take 1 week's worth of pills and put them away in a safe place. You should keep a separate, properly labeled bottle for this purpose. The remainder should be kept in the original bottle. Use this as your primary supply, until it runs out. Once it's gone, then you know that you have 1 week's worth of medicine, and it is time to come in for a prescription refill. If you do this correctly, it is unlikely that you will ever run out of medicine.  To make sure that the above recommendation works, it is very important that you make sure your medication refill appointments are scheduled at least 1 week before you run out of medicine. To do this in an effective manner, make sure that you do not leave the office without scheduling your next medication management appointment. Always ask the nursing staff to show you in your prescription , when your medication will be running out. Then arrange for the receptionist to get you a return appointment, at least 7 days before you run out of medicine. Do not wait until you have 1 or 2 pills left, to come in. This is very poor planning and  does not take into consideration that we may need to cancel appointments due to bad weather, sickness, or emergencies affecting our staff.  DO NOT ACCEPT A "Partial Fill": If for any reason your pharmacy does not have enough pills/tablets to completely fill or refill your prescription, do not allow for a "partial fill". The law allows the pharmacy to complete that prescription within 72 hours, without requiring a new prescription. If they do not fill the rest of your prescription within those 72 hours, you will need a separate prescription to fill the remaining amount, which we will NOT provide. If the reason for the partial fill is your insurance, you will need to talk to the pharmacist about payment alternatives for the remaining tablets, but again, DO NOT ACCEPT A PARTIAL FILL, unless you can trust your pharmacist to obtain the remainder of the pills within 72 hours.  Prescription refills and/or changes in medication(s):   Prescription refills, and/or changes in dose or medication, will be conducted only during scheduled medication management appointments. (Applies to both, written and electronic prescriptions.)  No refills on procedure days. No medication will be changed or started on procedure days. No changes, adjustments, and/or refills will be conducted on a procedure day. Doing so will interfere with the diagnostic portion of the procedure.  No phone refills. No medications will be "called into the pharmacy".  No Fax refills.  No weekend refills.  No Holliday refills.  No after hours refills.  Remember:  Business hours are:  Monday to Thursday 8:00 AM to 4:00 PM Provider's Schedule: Milinda Pointer, MD - Appointments are:  Medication management: Monday and Wednesday 8:00 AM to 4:00 PM Procedure day: Tuesday and Thursday 7:30 AM to 4:00 PM Gillis Santa, MD - Appointments are:  Medication management: Tuesday and Thursday 8:00 AM to 4:00 PM Procedure day: Monday and Wednesday  7:30 AM to 4:00 PM (Last update: 05/25/2020) ____________________________________________________________________________________________   ____________________________________________________________________________________________  CBD (cannabidiol) WARNING  Applicable to: All individuals currently taking or considering taking CBD (cannabidiol) and, more important, all patients taking opioid analgesic controlled substances (pain medication). (Example: oxycodone; oxymorphone; hydrocodone; hydromorphone; morphine; methadone; tramadol; tapentadol; fentanyl; buprenorphine; butorphanol; dextromethorphan; meperidine; codeine; etc.)  Legal status: CBD remains a Schedule I drug prohibited for any use. CBD is illegal with one exception. In the [place], CBD has a limited Transport planner (FDA) approval for the treatment of two specific types of epilepsy disorders. Only one CBD product has been approved by the FDA for this purpose: "Epidiolex". FDA is aware that some companies are marketing products containing cannabis and cannabis-derived compounds in ways that violate the [employer], Drug and Cosmetic Act [facility] Act) and that may put the health and safety of consumers at risk. The FDA, a Federal agency, has not enforced the CBD status since 2018.   Legality: Some manufacturers ship CBD products nationally, which is illegal. Often such products are sold online and are therefore available throughout the country. CBD is openly sold in head shops and health food stores in some states where such sales have not been explicitly legalized. Selling unapproved products with unsubstantiated therapeutic claims is not only a violation of the law, but also can put patients at risk, as these products have not been proven to be safe or effective. Federal illegality makes it difficult to conduct research on CBD.  Reference: "FDA Regulation of Cannabis and Cannabis-Derived Products, Including Cannabidiol  (CBD)" - [employer].pt  Warning: CBD is not FDA approved and has not undergo the same manufacturing controls as prescription drugs.  This means that the purity and safety of available CBD may be questionable. Most of the time, despite manufacturer's claims, it is contaminated with THC (delta-9-tetrahydrocannabinol - the chemical in marijuana responsible for the "HIGH").  When this is the case, the [facility] contaminant will trigger a positive urine drug screen (UDS) test for Marijuana (carboxy-THC). Because a positive UDS for any illicit substance is a violation of our medication agreement, your opioid analgesics (pain medicine) may be permanently discontinued.  MORE ABOUT CBD  General Information: CBD  is a derivative of the Marijuana (cannabis sativa) plant discovered in 21. It is one of the 113 identified substances found in Marijuana. It accounts for up to 40% of the plant's extract. As of 2018, preliminary clinical studies on CBD included research for the treatment of anxiety, movement disorders, and pain. CBD is available and consumed in multiple forms, including inhalation of smoke or vapor, as an aerosol spray, and by mouth. It may be supplied as an oil containing CBD, capsules, dried cannabis, or as a liquid solution. CBD is thought not to be as psychoactive as THC (delta-9-tetrahydrocannabinol - the chemical in marijuana responsible for the "HIGH"). Studies suggest that CBD may interact with different biological target receptors in the body, including cannabinoid and other neurotransmitter receptors. As of 2018 the mechanism of action for its biological effects has not been determined.  Side-effects  Adverse reactions: Dry mouth, diarrhea, decreased appetite, fatigue, drowsiness, malaise, weakness, sleep disturbances, and others.  Drug interactions: CBC may interact with other  medications such as blood-thinners. (Last update: 06/11/2020) ____________________________________________________________________________________________    ______________________________________________________________________________________________  Body mass index (BMI)  Body mass index (BMI) is a common tool for deciding whether a person has an  appropriate body weight.  It measures a persons weight in relation to their height.   According to the [employer] of health (NIH): Marland Kitchen A BMI of less than 18.5 means that a person is underweight. . A BMI of between 18.5 and 24.9 is ideal. . A BMI of between 25 and 29.9 is overweight. . A BMI over 30 indicates obesity.  Weight Management Required  URGENT: Your weight has been found to be adversely affecting your health.  Dear Ms. Whetstone:  Your current Estimated body mass index is 37.79 kg/m as calculated from the following:   Height as of this encounter: $RemoveBeforeD'5\' 1"'FlChimHGSuvMOK$  (1.549 m).   Weight as of this encounter: 200 lb (90.7 kg).  Please use the table below to identify your weight category and associated incidence of chronic pain, secondary to your weight.  Body Mass Index (BMI) Classification BMI level (kg/m2) Category Associated incidence of chronic pain  <18  Underweight   18.5-24.9 Ideal body weight   25-29.9 Overweight  20%  30-34.9 Obese (Class I)  68%  35-39.9 Severe obesity (Class II)  136%  >40 Extreme obesity (Class III)  254%   In addition: You will be considered "Morbidly Obese", if your BMI is above 30 and you have one or more of the following conditions which are known to be caused and/or directly associated with obesity: 1.    Type 2 Diabetes (Which in turn can lead to cardiovascular diseases (CVD), stroke, peripheral vascular diseases (PVD), retinopathy, nephropathy, and neuropathy) 2.    Cardiovascular Disease (High Blood Pressure; Congestive Heart Failure; High Cholesterol; Coronary Artery Disease; Angina; or  History of Heart Attacks) 3.    Breathing problems (Asthma; obesity-hypoventilation syndrome; obstructive sleep apnea; chronic inflammatory airway disease; reactive airway disease; or shortness of breath) 4.    Chronic kidney disease 5.    Liver disease (nonalcoholic fatty liver disease) 6.    High blood pressure 7.    Acid reflux (gastroesophageal reflux disease; heartburn) 8.    Osteoarthritis (OA) (with any of the following: hip pain; knee pain; and/or low back pain) 9.    Low back pain (Lumbar Facet Syndrome; and/or Degenerative Disc Disease) 10.  Hip pain (Osteoarthritis of hip) (For every 1 lbs of added body weight, there is a 2 lbs increase in pressure inside of each hip articulation. 1:2 mechanical relationship) 11.  Knee pain (Osteoarthritis of knee) (For every 1 lbs of added body weight, there is a 4 lbs increase in pressure inside of each knee articulation. 1:4 mechanical relationship) (patients with a BMI>30 kg/m2 were 6.8 times more likely to develop knee OA than normal-weight individuals) 12.  Cancer: Epidemiological studies have shown that obesity is a risk factor for: post-menopausal breast cancer; cancers of the endometrium, colon and kidney cancer; malignant adenomas of the oesophagus. Obese subjects have an approximately 1.5-3.5-fold increased risk of developing these cancers compared with normal-weight subjects, and it has been estimated that between 15 and 45% of these cancers can be attributed to overweight. More recent studies suggest that obesity may also increase the risk of other types of cancer, including pancreatic, hepatic and gallbladder cancer. (Ref: Obesity and cancer. Pischon T, Nthlings U, [employer] H. Proc Nutr Soc. 2008 May;67(2):128-45. doi: 81.8563/J4970263785885027.) The International Agency for Research on Cancer (IARC) has identified 13 cancers associated with overweight and obesity: meningioma, multiple myeloma, adenocarcinoma of the esophagus, and cancers of the  thyroid, postmenopausal breast cancer, gallbladder, stomach, liver, pancreas, kidney, ovaries, uterus, colon and rectal (colorectal) cancers.  90 percent of all cancers diagnosed in women and 24 percent of those diagnosed in men are associated with overweight and obesity.  Recommendation: At this point it is urgent that you take a step back and concentrate in loosing weight. Dedicate 100% of your efforts on this task. Nothing else will improve your health more than bringing your weight down and your BMI to less than 30. If you are here, you probably have chronic pain. We know that most chronic pain patients have difficulty exercising secondary to their pain. For this reason, you must rely on proper nutrition and diet in order to lose the weight. If your BMI is above 40, you should seriously consider bariatric surgery. A realistic goal is to lose 10% of your body weight over a period of 12 months.  Be honest to yourself, if over time you have unsuccessfully tried to lose weight, then it is time for you to seek professional help and to enter a medically supervised weight management program, and/or undergo bariatric surgery. Stop procrastinating.   Pain management considerations:  1.    Pharmacological Problems: Be advised that the use of opioid analgesics (oxycodone; hydrocodone; morphine; methadone; codeine; and all of their derivatives) have been associated with decreased metabolism and weight gain.  For this reason, should we see that you are unable to lose weight while taking these medications, it may become necessary for [place] to taper down and indefinitely discontinue them.  2.    Technical Problems: The incidence of successful interventional therapies decreases as the patient's BMI increases. It is much more difficult to accomplish a safe and effective interventional therapy on a patient with a BMI above 35. 3.    Radiation Exposure Problems: The x-rays machine, used to accomplish injection therapies, will  automatically increase their x-ray output in order to capture an appropriate bone image. This means that radiation exposure increases exponentially with the patient's BMI. (The higher the BMI, the higher the radiation exposure.) Although the level of radiation used at a given time is still safe to the patient, it is not for the physician and/or assisting staff. Unfortunately, radiation exposure is accumulative. Because physicians and the staff have to do procedures and be exposed on a daily basis, this can result in health problems such as cancer and radiation burns. Radiation exposure to the staff is monitored by the radiation batches that they wear. The exposure levels are reported back to the staff on a quarterly basis. Depending on levels of exposure, physicians and staff may be obligated by law to decrease this exposure. This means that they have the right and obligation to refuse providing therapies where they may be overexposed to radiation. For this reason, physicians may decline to offer therapies such as radiofrequency ablation or implants to patients with a BMI above 40. 4.    Current Trends: Be advised that the current trend is to no longer offer certain therapies to patients with a BMI equal to, or above 35, due to increase perioperative risks, increased technical procedural difficulties, and excessive radiation exposure to healthcare personnel.  ______________________________________________________________________________________________     ______________________________________________________________________________________________  Specialty Pain Scale  Introduction:  There are significant differences in how pain is reported. The word pain usually refers to physical pain, but it is also a common synonym of suffering. The medical community uses a scale from 0 (zero) to 10 (ten) to report pain level. Zero (0) is described as "no pain", while ten (10) is described as "the worse pain you  can imagine". The problem with this scale is that physical pain is reported along with suffering. Suffering refers to mental pain, or more often yet it refers to any unpleasant feeling, emotion or aversion associated with the perception of harm or threat of harm. It is the psychological component of pain.  Pain Specialists prefer to separate the two components. The pain scale used by this practice is the Verbal Numerical Rating Scale (VNRS-11). This scale is for the physical pain only. DO NOT INCLUDE how your pain psychologically affects you. This scale is for adults 69 years of age and older. It has 11 (eleven) levels. The 1st level is 0/10. This means: "right now, I have no pain". In the context of pain management, it also means: "right now, my physical pain is under control with the current therapy".  General Information:  The scale should reflect your current level of pain. Unless you are specifically asked for the level of your worst pain, or your average pain. If you are asked for one of these two, then it should be understood that it is over the past 24 hours.  Levels 1 (one) through 5 (five) are described below, and can be treated as an outpatient. Ambulatory pain management facilities such as ours are more than adequate to treat these levels. Levels 6 (six) through 10 (ten) are also described below, however, these must be treated as a hospitalized patient. While levels 6 (six) and 7 (seven) may be evaluated at an urgent care facility, levels 8 (eight) through 10 (ten) constitute medical emergencies and as such, they belong in a hospital's emergency department. When having these levels (as described below), do not come to our office. Our facility is not equipped to manage these levels. Go directly to an urgent care facility or an emergency department to be evaluated.  Definitions:  Activities of Daily Living (ADL): Activities of daily living (ADL or ADLs) is a term used in healthcare to refer to  people's daily self-care activities. Health professionals often use a person's ability or inability to perform ADLs as a measurement of their functional status, particularly in regard to people post injury, with disabilities and the elderly. There are two ADL levels: Basic and Instrumental. Basic Activities of Daily Living (BADL  or BADLs) consist of self-care tasks that include: Bathing and showering; personal hygiene and grooming (including brushing/combing/styling hair); dressing; Toilet hygiene (getting to the toilet, cleaning oneself, and getting back up); eating and self-feeding (not including cooking or chewing and swallowing); functional mobility, often referred to as "transferring", as measured by the ability to walk, get in and out of bed, and get into and out of a chair; the broader definition (moving from one place to another while performing activities) is useful for people with different physical abilities who are still able to get around independently. Basic ADLs include the things many people do when they get up in the morning and get ready to go out of the house: get out of bed, go to the toilet, bathe, dress, groom, and eat. On the average, loss of function typically follows a particular order. Hygiene is the first to go, followed by loss of toilet use and locomotion. The last to go is the ability to eat. When there is only one remaining area in which the person is independent, there is a 62.9% chance that it is eating and only a 3.5% chance that it is hygiene. Instrumental Activities of Daily Living (IADL or IADLs) are not necessary for fundamental  functioning, but they let an individual live independently in a community. IADL consist of tasks that include: cleaning and maintaining the house; home establishment and maintenance; care of others (including selecting and supervising caregivers); care of pets; child rearing; managing money; managing financials (investments, etc.); meal preparation  and cleanup; shopping for groceries and necessities; moving within the community; safety procedures and emergency responses; health management and maintenance (taking prescribed medications); and using the telephone or other form of communication.  Instructions:  Most patients tend to report their pain as a combination of two factors, their physical pain and their psychosocial pain. This last one is also known as "suffering" and it is reflection of how physical pain affects you socially and psychologically. From now on, report them separately.  From this point on, when asked to report your pain level, report only your physical pain. Use the following table for reference.  Pain Clinic Pain Levels (0-5/10)  Pain Level Score  Description  No Pain 0   Mild pain 1 Nagging, annoying, but does not interfere with basic activities of daily living (ADL). Patients are able to eat, bathe, get dressed, toileting (being able to get on and off the toilet and perform personal hygiene functions), transfer (move in and out of bed or a chair without assistance), and maintain continence (able to control bladder and bowel functions). Blood pressure and heart rate are unaffected. A normal heart rate for a healthy adult ranges from 60 to 100 bpm (beats per minute).   Mild to moderate pain 2 Noticeable and distracting. Impossible to hide from other people. More frequent flare-ups. Still possible to adapt and function close to normal. It can be very annoying and may have occasional stronger flare-ups. With discipline, patients may get used to it and adapt.   Moderate pain 3 Interferes significantly with activities of daily living (ADL). It becomes difficult to feed, bathe, get dressed, get on and off the toilet or to perform personal hygiene functions. Difficult to get in and out of bed or a chair without assistance. Very distracting. With effort, it can be ignored when deeply involved in activities.   Moderately severe pain  4 Impossible to ignore for more than a few minutes. With effort, patients may still be able to manage work or participate in some social activities. Very difficult to concentrate. Signs of autonomic nervous system discharge are evident: dilated pupils (mydriasis); mild sweating (diaphoresis); sleep interference. Heart rate becomes elevated (>115 bpm). Diastolic blood pressure (lower number) rises above 100 mmHg. Patients find relief in laying down and not moving.   Severe pain 5 Intense and extremely unpleasant. Associated with frowning face and frequent crying. Pain overwhelms the senses.  Ability to do any activity or maintain social relationships becomes significantly limited. Conversation becomes difficult. Pacing back and forth is common, as getting into a comfortable position is nearly impossible. Pain wakes you up from deep sleep. Physical signs will be obvious: pupillary dilation; increased sweating; goosebumps; brisk reflexes; cold, clammy hands and feet; nausea, vomiting or dry heaves; loss of appetite; significant sleep disturbance with inability to fall asleep or to remain asleep. When persistent, significant weight loss is observed due to the complete loss of appetite and sleep deprivation.  Blood pressure and heart rate becomes significantly elevated. Caution: If elevated blood pressure triggers a pounding headache associated with blurred vision, then the patient should immediately seek attention at an urgent or emergency care unit, as these may be signs of an impending stroke.  Emergency Department Pain Levels (6-10/10)  Emergency Room Pain 6 Severely limiting. Requires emergency care and should not be seen or managed at an outpatient pain management facility. Communication becomes difficult and requires great effort. Assistance to reach the emergency department may be required. Facial flushing and profuse sweating along with potentially dangerous increases in heart rate and blood pressure  will be evident.   Distressing pain 7 Self-care is very difficult. Assistance is required to transport, or use restroom. Assistance to reach the emergency department will be required. Tasks requiring coordination, such as bathing and getting dressed become very difficult.   Disabling pain 8 Self-care is no longer possible. At this level, pain is disabling. The individual is unable to do even the most "basic" activities such as walking, eating, bathing, dressing, transferring to a bed, or toileting. Fine motor skills are lost. It is difficult to think clearly.   Incapacitating pain 9 Pain becomes incapacitating. Thought processing is no longer possible. Difficult to remember your own name. Control of movement and coordination are lost.   The worst pain imaginable 10 At this level, most patients pass out from pain. When this level is reached, collapse of the autonomic nervous system occurs, leading to a sudden drop in blood pressure and heart rate. This in turn results in a temporary and dramatic drop in blood flow to the brain, leading to a loss of consciousness. Fainting is one of the body's self defense mechanisms. Passing out puts the brain in a calmed state and causes it to shut down for a while, in order to begin the healing process.    Summary: 1.   Refer to this scale when providing [place] with your pain level. 2.   Be accurate and careful when reporting your pain level. This will help with your care. 3.   Over-reporting your pain level will lead to loss of credibility. 4.   Even a level of 1/10 means that there is pain and will be treated at our facility. 5.   High, inaccurate reporting will be documented as "Symptom Exaggeration", leading to loss of credibility and suspicions of possible secondary gains such as obtaining more narcotics, or wanting to appear disabled, for fraudulent reasons. 6.   Only pain levels of 5 or below will be seen at our facility. 7.   Pain levels of 6 and above will be  sent to the Emergency Department and the appointment cancelled.  ______________________________________________________________________________________________

## 2020-12-28 NOTE — Progress Notes (Signed)
Nursing Pain Medication Assessment:  Safety precautions to be maintained throughout the outpatient stay will include: orient to surroundings, keep bed in low position, maintain call bell within reach at all times, provide assistance with transfer out of bed and ambulation.  Medication Inspection Compliance: Pill count conducted under aseptic conditions, in front of the patient. Neither the pills nor the bottle was removed from the patient's sight at any time. Once count was completed pills were immediately returned to the patient in their original bottle.  Medication: Oxycodone IR Pill/Patch Count: 10 of 90 pills remain Pill/Patch Appearance: Markings consistent with prescribed medication Bottle Appearance: Standard pharmacy container. Clearly labeled. Filled Date: 02 / 1 / 22 Last Medication intake:  TodaySafety precautions to be maintained throughout the outpatient stay will include: orient to surroundings, keep bed in low position, maintain call bell within reach at all times, provide assistance with transfer out of bed and ambulation.

## 2020-12-31 ENCOUNTER — Other Ambulatory Visit: Payer: Self-pay | Admitting: Pain Medicine

## 2020-12-31 DIAGNOSIS — M792 Neuralgia and neuritis, unspecified: Secondary | ICD-10-CM

## 2020-12-31 DIAGNOSIS — E1142 Type 2 diabetes mellitus with diabetic polyneuropathy: Secondary | ICD-10-CM

## 2021-01-03 ENCOUNTER — Ambulatory Visit: Payer: Medicaid Other | Admitting: Pain Medicine

## 2021-01-03 NOTE — Patient Instructions (Incomplete)
____________________________________________________________________________________________  Post-Procedure Discharge Instructions  Instructions:  Apply ice:   Purpose: This will minimize any swelling and discomfort after procedure.   When: Day of procedure, as soon as you get home.  How: Fill a plastic sandwich bag with crushed ice. Cover it with a small towel and apply to injection site.  How long: (15 min on, 15 min off) Apply for 15 minutes then remove x 15 minutes.  Repeat sequence on day of procedure, until you go to bed.  Apply heat:   Purpose: To treat any soreness and discomfort from the procedure.  When: Starting the next day after the procedure.  How: Apply heat to procedure site starting the day following the procedure.  How long: May continue to repeat daily, until discomfort goes away.  Food intake: Start with clear liquids (like water) and advance to regular food, as tolerated.   Physical activities: Keep activities to a minimum for the first 8 hours after the procedure. After that, then as tolerated.  Driving: If you have received any sedation, be responsible and do not drive. You are not allowed to drive for 24 hours after having sedation.  Blood thinner: (Applies only to those taking blood thinners) You may restart your blood thinner 6 hours after your procedure.  Insulin: (Applies only to Diabetic patients taking insulin) As soon as you can eat, you may resume your normal dosing schedule.  Infection prevention: Keep procedure site clean and dry. Shower daily and clean area with soap and water.  Post-procedure Pain Diary: Extremely important that this be done correctly and accurately. Recorded information will be used to determine the next step in treatment. For the purpose of accuracy, follow these rules:  Evaluate only the area treated. Do not report or include pain from an untreated area. For the purpose of this evaluation, ignore all other areas of pain,  except for the treated area.  After your procedure, avoid taking a long nap and attempting to complete the pain diary after you wake up. Instead, set your alarm clock to go off every hour, on the hour, for the initial 8 hours after the procedure. Document the duration of the numbing medicine, and the relief you are getting from it.  Do not go to sleep and attempt to complete it later. It will not be accurate. If you received sedation, it is likely that you were given a medication that may cause amnesia. Because of this, completing the diary at a later time may cause the information to be inaccurate. This information is needed to plan your care.  Follow-up appointment: Keep your post-procedure follow-up evaluation appointment after the procedure (usually 2 weeks for most procedures, 6 weeks for radiofrequencies). DO NOT FORGET to bring you pain diary with you.   Expect: (What should I expect to see with my procedure?)  From numbing medicine (AKA: Local Anesthetics): Numbness or decrease in pain. You may also experience some weakness, which if present, could last for the duration of the local anesthetic.  Onset: Full effect within 15 minutes of injected.  Duration: It will depend on the type of local anesthetic used. On the average, 1 to 8 hours.   From steroids (Applies only if steroids were used): Decrease in swelling or inflammation. Once inflammation is improved, relief of the pain will follow.  Onset of benefits: Depends on the amount of swelling present. The more swelling, the longer it will take for the benefits to be seen. In some cases, up to 10 days.  Duration: Steroids will stay in the system x 2 weeks. Duration of benefits will depend on multiple posibilities including persistent irritating factors.  Side-effects: If present, they may typically last 2 weeks (the duration of the steroids).  Frequent: Cramps (if they occur, drink Gatorade and take over-the-counter Magnesium 450-500 mg  once to twice a day); water retention with temporary weight gain; increases in blood sugar; decreased immune system response; increased appetite.  Occasional: Facial flushing (red, warm cheeks); mood swings; menstrual changes.  Uncommon: Long-term decrease or suppression of natural hormones; bone thinning. (These are more common with higher doses or more frequent use. This is why we prefer that our patients avoid having any injection therapies in other practices.)   Very Rare: Severe mood changes; psychosis; aseptic necrosis.  From procedure: Some discomfort is to be expected once the numbing medicine wears off. This should be minimal if ice and heat are applied as instructed.  Call if: (When should I call?)  You experience numbness and weakness that gets worse with time, as opposed to wearing off.  New onset bowel or bladder incontinence. (Applies only to procedures done in the spine)  Emergency Numbers:  Durning business hours (Monday - Thursday, 8:00 AM - 4:00 PM) (Friday, 9:00 AM - 12:00 Noon): [phone]  After hours: [phone]  NOTE: If you are having a problem and are unable connect with, or to talk to a provider, then go to your nearest urgent care or emergency department. If the problem is serious and urgent, please call 911. ____________________________________________________________________________________________    ______________________________________________________________________________________________  Body mass index (BMI)  Body mass index (BMI) is a common tool for deciding whether a person has an appropriate body weight.  It measures a persons weight in relation to their height.   According to the [employer] of health (NIH): Marland Kitchen A BMI of less than 18.5 means that a person is underweight. . A BMI of between 18.5 and 24.9 is ideal. . A BMI of between 25 and 29.9 is overweight. . A BMI over 30 indicates obesity.  Weight Management  Required  URGENT: Your weight has been found to be adversely affecting your health.  Dear Shannon Benitez:  Your current Estimated body mass index is 37.79 kg/m as calculated from the following:   Height as of 12/28/20: $RemoveBef'5\' 1"'[id number]$  (1.549 m).   Weight as of 12/28/20: 200 lb (90.7 kg).  Please use the table below to identify your weight category and associated incidence of chronic pain, secondary to your weight.  Body Mass Index (BMI) Classification BMI level (kg/m2) Category Associated incidence of chronic pain  <18  Underweight   18.5-24.9 Ideal body weight   25-29.9 Overweight  20%  30-34.9 Obese (Class I)  68%  35-39.9 Severe obesity (Class II)  136%  >40 Extreme obesity (Class III)  254%   In addition: You will be considered "Morbidly Obese", if your BMI is above 30 and you have one or more of the following conditions which are known to be caused and/or directly associated with obesity: 1.    Type 2 Diabetes (Which in turn can lead to cardiovascular diseases (CVD), stroke, peripheral vascular diseases (PVD), retinopathy, nephropathy, and neuropathy) 2.    Cardiovascular Disease (High Blood Pressure; Congestive Heart Failure; High Cholesterol; Coronary Artery Disease; Angina; or History of Heart Attacks) 3.    Breathing problems (Asthma; obesity-hypoventilation syndrome; obstructive sleep apnea; chronic inflammatory airway disease; reactive airway disease; or shortness of breath) 4.    Chronic kidney  disease 5.    Liver disease (nonalcoholic fatty liver disease) 6.    High blood pressure 7.    Acid reflux (gastroesophageal reflux disease; heartburn) 8.    Osteoarthritis (OA) (with any of the following: hip pain; knee pain; and/or low back pain) 9.    Low back pain (Lumbar Facet Syndrome; and/or Degenerative Disc Disease) 10.  Hip pain (Osteoarthritis of hip) (For every 1 lbs of added body weight, there is a 2 lbs increase in pressure inside of each hip articulation. 1:2 mechanical  relationship) 11.  Knee pain (Osteoarthritis of knee) (For every 1 lbs of added body weight, there is a 4 lbs increase in pressure inside of each knee articulation. 1:4 mechanical relationship) (patients with a BMI>30 kg/m2 were 6.8 times more likely to develop knee OA than normal-weight individuals) 12.  Cancer: Epidemiological studies have shown that obesity is a risk factor for: post-menopausal breast cancer; cancers of the endometrium, colon and kidney cancer; malignant adenomas of the oesophagus. Obese subjects have an approximately 1.5-3.5-fold increased risk of developing these cancers compared with normal-weight subjects, and it has been estimated that between 15 and 45% of these cancers can be attributed to overweight. More recent studies suggest that obesity may also increase the risk of other types of cancer, including pancreatic, hepatic and gallbladder cancer. (Ref: Obesity and cancer. Pischon T, Nthlings U, [employer] H. Proc Nutr Soc. 2008 May;67(2):128-45. doi: 11.5726/O0355974163845364.) The International Agency for Research on Cancer (IARC) has identified 13 cancers associated with overweight and obesity: meningioma, multiple myeloma, adenocarcinoma of the esophagus, and cancers of the thyroid, postmenopausal breast cancer, gallbladder, stomach, liver, pancreas, kidney, ovaries, uterus, colon and rectal (colorectal) cancers. 28 percent of all cancers diagnosed in women and 24 percent of those diagnosed in men are associated with overweight and obesity.  Recommendation: At this point it is urgent that you take a step back and concentrate in loosing weight. Dedicate 100% of your efforts on this task. Nothing else will improve your health more than bringing your weight down and your BMI to less than 30. If you are here, you probably have chronic pain. We know that most chronic pain patients have difficulty exercising secondary to their pain. For this reason, you must rely on proper nutrition and diet  in order to lose the weight. If your BMI is above 40, you should seriously consider bariatric surgery. A realistic goal is to lose 10% of your body weight over a period of 12 months.  Be honest to yourself, if over time you have unsuccessfully tried to lose weight, then it is time for you to seek professional help and to enter a medically supervised weight management program, and/or undergo bariatric surgery. Stop procrastinating.   Pain management considerations:  1.    Pharmacological Problems: Be advised that the use of opioid analgesics (oxycodone; hydrocodone; morphine; methadone; codeine; and all of their derivatives) have been associated with decreased metabolism and weight gain.  For this reason, should we see that you are unable to lose weight while taking these medications, it may become necessary for [place] to taper down and indefinitely discontinue them.  2.    Technical Problems: The incidence of successful interventional therapies decreases as the patient's BMI increases. It is much more difficult to accomplish a safe and effective interventional therapy on a patient with a BMI above 35. 3.    Radiation Exposure Problems: The x-rays machine, used to accomplish injection therapies, will automatically increase their x-ray output in order to  capture an appropriate bone image. This means that radiation exposure increases exponentially with the patient's BMI. (The higher the BMI, the higher the radiation exposure.) Although the level of radiation used at a given time is still safe to the patient, it is not for the physician and/or assisting staff. Unfortunately, radiation exposure is accumulative. Because physicians and the staff have to do procedures and be exposed on a daily basis, this can result in health problems such as cancer and radiation burns. Radiation exposure to the staff is monitored by the radiation batches that they wear. The exposure levels are reported back to the staff on a quarterly  basis. Depending on levels of exposure, physicians and staff may be obligated by law to decrease this exposure. This means that they have the right and obligation to refuse providing therapies where they may be overexposed to radiation. For this reason, physicians may decline to offer therapies such as radiofrequency ablation or implants to patients with a BMI above 40. 4.    Current Trends: Be advised that the current trend is to no longer offer certain therapies to patients with a BMI equal to, or above 35, due to increase perioperative risks, increased technical procedural difficulties, and excessive radiation exposure to healthcare personnel.  ______________________________________________________________________________________________

## 2021-01-03 NOTE — Progress Notes (Deleted)
No-show to procedure appointment  

## 2021-01-12 ENCOUNTER — Encounter: Payer: Self-pay | Admitting: Pain Medicine

## 2021-01-12 LAB — TOXASSURE SELECT 13 (MW), URINE

## 2021-04-04 DIAGNOSIS — E871 Hypo-osmolality and hyponatremia: Secondary | ICD-10-CM | POA: Insufficient documentation

## 2021-04-05 ENCOUNTER — Encounter: Payer: Medicaid Other | Admitting: Pain Medicine

## 2021-04-05 ENCOUNTER — Other Ambulatory Visit: Payer: Self-pay

## 2021-04-05 ENCOUNTER — Encounter: Payer: Self-pay | Admitting: Pain Medicine

## 2021-04-05 ENCOUNTER — Ambulatory Visit: Payer: Medicaid Other | Attending: Pain Medicine | Admitting: Pain Medicine

## 2021-04-05 VITALS — BP 145/98 | HR 85 | Temp 97.1°F | Resp 16 | Ht 61.0 in | Wt 205.0 lb

## 2021-04-05 DIAGNOSIS — M25551 Pain in right hip: Secondary | ICD-10-CM

## 2021-04-05 DIAGNOSIS — Z79891 Long term (current) use of opiate analgesic: Secondary | ICD-10-CM

## 2021-04-05 DIAGNOSIS — M47816 Spondylosis without myelopathy or radiculopathy, lumbar region: Secondary | ICD-10-CM | POA: Diagnosis not present

## 2021-04-05 DIAGNOSIS — M5441 Lumbago with sciatica, right side: Secondary | ICD-10-CM

## 2021-04-05 DIAGNOSIS — G8929 Other chronic pain: Secondary | ICD-10-CM

## 2021-04-05 DIAGNOSIS — M25561 Pain in right knee: Secondary | ICD-10-CM

## 2021-04-05 DIAGNOSIS — M25562 Pain in left knee: Secondary | ICD-10-CM

## 2021-04-05 DIAGNOSIS — G894 Chronic pain syndrome: Secondary | ICD-10-CM | POA: Diagnosis not present

## 2021-04-05 DIAGNOSIS — M79604 Pain in right leg: Secondary | ICD-10-CM

## 2021-04-05 DIAGNOSIS — M25552 Pain in left hip: Secondary | ICD-10-CM

## 2021-04-05 DIAGNOSIS — M5442 Lumbago with sciatica, left side: Secondary | ICD-10-CM

## 2021-04-05 DIAGNOSIS — Z79899 Other long term (current) drug therapy: Secondary | ICD-10-CM

## 2021-04-05 DIAGNOSIS — M79605 Pain in left leg: Secondary | ICD-10-CM

## 2021-04-05 MED ORDER — OXYCODONE HCL 5 MG PO TABS: 5.0000 mg | ORAL_TABLET | Freq: Three times a day (TID) | ORAL | 0 refills | Status: DC | PRN

## 2021-04-05 NOTE — Progress Notes (Signed)
Did not bring bottle or medication with her today. Last took medication this morning. Patient states that she did not know what her appointment was for today. Instructed to bring bottle to appointments.

## 2021-04-05 NOTE — Patient Instructions (Addendum)
____________________________________________________________________________________________  Preparing for Procedure with Sedation  Procedure appointments are limited to planned procedures: . No Prescription Refills. . No disability issues will be discussed. . No medication changes will be discussed.  Instructions: . Oral Intake: Do not eat or drink anything for at least 8 hours prior to your procedure. (Exception: Blood Pressure Medication. See below.) . Transportation: Unless otherwise stated by your physician, you may drive yourself after the procedure. . Blood Pressure Medicine: Do not forget to take your blood pressure medicine with a sip of water the morning of the procedure. If your Diastolic (lower reading)is above 100 mmHg, elective cases will be cancelled/rescheduled. . Blood thinners: These will need to be stopped for procedures. Notify our staff if you are taking any blood thinners. Depending on which one you take, there will be specific instructions on how and when to stop it. . Diabetics on insulin: Notify the staff so that you can be scheduled 1st case in the morning. If your diabetes requires high dose insulin, take only  of your normal insulin dose the morning of the procedure and notify the staff that you have done so. . Preventing infections: Shower with an antibacterial soap the morning of your procedure. . Build-up your immune system: Take 1000 mg of Vitamin C with every meal (3 times a day) the day prior to your procedure. . Antibiotics: Inform the staff if you have a condition or reason that requires you to take antibiotics before dental procedures. . Pregnancy: If you are pregnant, call and cancel the procedure. . Sickness: If you have a cold, fever, or any active infections, call and cancel the procedure. . Arrival: You must be in the facility at least 30 minutes prior to your scheduled procedure. . Children: Do not bring children with you. . Dress appropriately:  Bring dark clothing that you would not mind if they get stained. . Valuables: Do not bring any jewelry or valuables.  Reasons to call and reschedule or cancel your procedure: (Following these recommendations will minimize the risk of a serious complication.) . Surgeries: Avoid having procedures within 2 weeks of any surgery. (Avoid for 2 weeks before or after any surgery). . Flu Shots: Avoid having procedures within 2 weeks of a flu shots or . (Avoid for 2 weeks before or after immunizations). . Barium: Avoid having a procedure within 7-10 days after having had a radiological study involving the use of radiological contrast. (Myelograms, Barium swallow or enema study). . Heart attacks: Avoid any elective procedures or surgeries for the initial 6 months after a "Myocardial Infarction" (Heart Attack). . Blood thinners: It is imperative that you stop these medications before procedures. Let us know if you if you take any blood thinner.  . Infection: Avoid procedures during or within two weeks of an infection (including chest colds or gastrointestinal problems). Symptoms associated with infections include: Localized redness, fever, chills, night sweats or profuse sweating, burning sensation when voiding, cough, congestion, stuffiness, runny nose, sore throat, diarrhea, nausea, vomiting, cold or Flu symptoms, recent or current infections. It is specially important if the infection is over the area that we intend to treat. . Heart and lung problems: Symptoms that may suggest an active cardiopulmonary problem include: cough, chest pain, breathing difficulties or shortness of breath, dizziness, ankle swelling, uncontrolled high or unusually low blood pressure, and/or palpitations. If you are experiencing any of these symptoms, cancel your procedure and contact your primary care physician for an evaluation.  Remember:  Regular Business hours are:    Monday to Thursday 8:00 AM to 4:00 PM  Provider's  Schedule: Shannon Pointer, MD:  Procedure days: Tuesday and Thursday 7:30 AM to 4:00 PM  Shannon Santa, MD:  Procedure days: Monday and Wednesday 7:30 AM to 4:00 PM ____________________________________________________________________________________________   ____________________________________________________________________________________________  General Risks and Possible Complications  Patient Responsibilities: It is important that you read this as it is part of your informed consent. It is our duty to inform you of the risks and possible complications associated with treatments offered to you. It is your responsibility as a patient to read this and to ask questions about anything that is not clear or that you believe was not covered in this document.  Patient's Rights: You have the right to refuse treatment. You also have the right to change your mind, even after initially having agreed to have the treatment done. However, under this last option, if you wait until the last second to change your mind, you may be charged for the materials used up to that point.  Introduction: Medicine is not an Chief Strategy Officer. Everything in Medicine, including the lack of treatment(s), carries the potential for danger, harm, or loss (which is by definition: Risk). In Medicine, a complication is a secondary problem, condition, or disease that can aggravate an already existing one. All treatments carry the risk of possible complications. The fact that a side effects or complications occurs, does not imply that the treatment was conducted incorrectly. It must be clearly understood that these can happen even when everything is done following the highest safety standards.  No treatment: You can choose not to proceed with the proposed treatment alternative. The "PRO(s)" would include: avoiding the risk of complications associated with the therapy. The "CON(s)" would include: not getting any of the treatment  benefits. These benefits fall under one of three categories: diagnostic; therapeutic; and/or palliative. Diagnostic benefits include: getting information which can ultimately lead to improvement of the disease or symptom(s). Therapeutic benefits are those associated with the successful treatment of the disease. Finally, palliative benefits are those related to the decrease of the primary symptoms, without necessarily curing the condition (example: decreasing the pain from a flare-up of a chronic condition, such as incurable terminal cancer).  General Risks and Complications: These are associated to most interventional treatments. They can occur alone, or in combination. They fall under one of the following six (6) categories: no benefit or worsening of symptoms; bleeding; infection; nerve damage; allergic reactions; and/or death. 1. No benefits or worsening of symptoms: In Medicine there are no guarantees, only probabilities. No healthcare provider can ever guarantee that a medical treatment will work, they can only state the probability that it may. Furthermore, there is always the possibility that the condition may worsen, either directly, or indirectly, as a consequence of the treatment. 2. Bleeding: This is more common if the patient is taking a blood thinner, either prescription or over the counter (example: Goody Powders, Fish oil, Aspirin, Garlic, etc.), or if suffering a condition associated with impaired coagulation (example: Hemophilia, cirrhosis of the liver, low platelet counts, etc.). However, even if you do not have one on these, it can still happen. If you have any of these conditions, or take one of these drugs, make sure to notify your treating physician. 3. Infection: This is more common in patients with a compromised immune system, either due to disease (example: diabetes, cancer, human immunodeficiency virus [HIV], etc.), or due to medications or treatments (example: therapies used to treat  cancer and  rheumatological diseases). However, even if you do not have one on these, it can still happen. If you have any of these conditions, or take one of these drugs, make sure to notify your treating physician. 4. Nerve Damage: This is more common when the treatment is an invasive one, but it can also happen with the use of medications, such as those used in the treatment of cancer. The damage can occur to small secondary nerves, or to large primary ones, such as those in the spinal cord and brain. This damage may be temporary or permanent and it may lead to impairments that can range from temporary numbness to permanent paralysis and/or brain death. 5. Allergic Reactions: Any time a substance or material comes in contact with our body, there is the possibility of an allergic reaction. These can range from a mild skin rash (contact dermatitis) to a severe systemic reaction (anaphylactic reaction), which can result in death. 6. Death: In general, any medical intervention can result in death, most of the time due to an unforeseen complication. ____________________________________________________________________________________________   ______________________________________________________________________________________________  Body mass index (BMI)  Body mass index (BMI) is a common tool for deciding whether a person has an appropriate body weight.  It measures a persons weight in relation to their height.   According to the [employer] of health (NIH): Marland Kitchen A BMI of less than 18.5 means that a person is underweight. . A BMI of between 18.5 and 24.9 is ideal. . A BMI of between 25 and 29.9 is overweight. . A BMI over 30 indicates obesity.  Weight Management Required  URGENT: Your weight has been found to be adversely affecting your health.  Dear Shannon Benitez:  Your current Estimated body mass index is 38.73 kg/m as calculated from the following:   Height as of this encounter:  $[id number]\' 1"'[id number]$  (1.549 m).   Weight as of this encounter: 205 lb (93 kg).  Please use the table below to identify your weight category and associated incidence of chronic pain, secondary to your weight.  Body Mass Index (BMI) Classification BMI level (kg/m2) Category Associated incidence of chronic pain  <18  Underweight   18.5-24.9 Ideal body weight   25-29.9 Overweight  20%  30-34.9 Obese (Class I)  68%  35-39.9 Severe obesity (Class II)  136%  >40 Extreme obesity (Class III)  254%   In addition: You will be considered "Morbidly Obese", if your BMI is above 30 and you have one or more of the following conditions which are known to be caused and/or directly associated with obesity: 1.    Type 2 Diabetes (Which in turn can lead to cardiovascular diseases (CVD), stroke, peripheral vascular diseases (PVD), retinopathy, nephropathy, and neuropathy) 2.    Cardiovascular Disease (High Blood Pressure; Congestive Heart Failure; High Cholesterol; Coronary Artery Disease; Angina; or History of Heart Attacks) 3.    Breathing problems (Asthma; obesity-hypoventilation syndrome; obstructive sleep apnea; chronic inflammatory airway disease; reactive airway disease; or shortness of breath) 4.    Chronic kidney disease 5.    Liver disease (nonalcoholic fatty liver disease) 6.    High blood pressure 7.    Acid reflux (gastroesophageal reflux disease; heartburn) 8.    Osteoarthritis (OA) (with any of the following: hip pain; knee pain; and/or low back pain) 9.    Low back pain (Lumbar Facet Syndrome; and/or Degenerative Disc Disease) 10.  Hip pain (Osteoarthritis of hip) (For every 1 lbs of added body weight, there is a 2 lbs increase  in pressure inside of each hip articulation. 1:2 mechanical relationship) 11.  Knee pain (Osteoarthritis of knee) (For every 1 lbs of added body weight, there is a 4 lbs increase in pressure inside of each knee articulation. 1:4 mechanical relationship) (patients with a BMI>30 kg/m2 were  6.8 times more likely to develop knee OA than normal-weight individuals) 12.  Cancer: Epidemiological studies have shown that obesity is a risk factor for: post-menopausal breast cancer; cancers of the endometrium, colon and kidney cancer; malignant adenomas of the oesophagus. Obese subjects have an approximately 1.5-3.5-fold increased risk of developing these cancers compared with normal-weight subjects, and it has been estimated that between 15 and 45% of these cancers can be attributed to overweight. More recent studies suggest that obesity may also increase the risk of other types of cancer, including pancreatic, hepatic and gallbladder cancer. (Ref: Obesity and cancer. Pischon T, Nthlings U, [employer] H. Proc Nutr Soc. 2008 May;67(2):128-45. doi: 09.6283/[id number].) The International Agency for Research on Cancer (IARC) has identified 13 cancers associated with overweight and obesity: meningioma, multiple myeloma, adenocarcinoma of the esophagus, and cancers of the thyroid, postmenopausal breast cancer, gallbladder, stomach, liver, pancreas, kidney, ovaries, uterus, colon and rectal (colorectal) cancers. 62 percent of all cancers diagnosed in women and 24 percent of those diagnosed in men are associated with overweight and obesity.  Recommendation: At this point it is urgent that you take a step back and concentrate in loosing weight. Dedicate 100% of your efforts on this task. Nothing else will improve your health more than bringing your weight down and your BMI to less than 30. If you are here, you probably have chronic pain. We know that most chronic pain patients have difficulty exercising secondary to their pain. For this reason, you must rely on proper nutrition and diet in order to lose the weight. If your BMI is above 40, you should seriously consider bariatric surgery. A realistic goal is to lose 10% of your body weight over a period of 12 months.  Be honest to yourself, if over time you have  unsuccessfully tried to lose weight, then it is time for you to seek professional help and to enter a medically supervised weight management program, and/or undergo bariatric surgery. Stop procrastinating.   Pain management considerations:  1.    Pharmacological Problems: Be advised that the use of opioid analgesics (oxycodone; hydrocodone; morphine; methadone; codeine; and all of their derivatives) have been associated with decreased metabolism and weight gain.  For this reason, should we see that you are unable to lose weight while taking these medications, it may become necessary for [place] to taper down and indefinitely discontinue them.  2.    Technical Problems: The incidence of successful interventional therapies decreases as the patient's BMI increases. It is much more difficult to accomplish a safe and effective interventional therapy on a patient with a BMI above 35. 3.    Radiation Exposure Problems: The x-rays machine, used to accomplish injection therapies, will automatically increase their x-ray output in order to capture an appropriate bone image. This means that radiation exposure increases exponentially with the patient's BMI. (The higher the BMI, the higher the radiation exposure.) Although the level of radiation used at a given time is still safe to the patient, it is not for the physician and/or assisting staff. Unfortunately, radiation exposure is accumulative. Because physicians and the staff have to do procedures and be exposed on a daily basis, this can result in health problems such as cancer and radiation  burns. Radiation exposure to the staff is monitored by the radiation batches that they wear. The exposure levels are reported back to the staff on a quarterly basis. Depending on levels of exposure, physicians and staff may be obligated by law to decrease this exposure. This means that they have the right and obligation to refuse providing therapies where they may be overexposed to  radiation. For this reason, physicians may decline to offer therapies such as radiofrequency ablation or implants to patients with a BMI above 40. 4.    Current Trends: Be advised that the current trend is to no longer offer certain therapies to patients with a BMI equal to, or above 35, due to increase perioperative risks, increased technical procedural difficulties, and excessive radiation exposure to healthcare personnel.  ______________________________________________________________________________________________     ____________________________________________________________________________________________  Medication Rules  Purpose: To inform patients, and their family members, of our rules and regulations.  Applies to: All patients receiving prescriptions (written or electronic).  Pharmacy of record: Pharmacy where electronic prescriptions will be sent. If written prescriptions are taken to a different pharmacy, please inform the nursing staff. The pharmacy listed in the electronic medical record should be the one where you would like electronic prescriptions to be sent.  Electronic prescriptions: In compliance with the [place] (STOP) Act of 2017 (Session Lanny Cramp [phone]), effective November 05, 2018, all controlled substances must be electronically prescribed. Calling prescriptions to the pharmacy will cease to exist.  Prescription refills: Only during scheduled appointments. Applies to all prescriptions.  NOTE: The following applies primarily to controlled substances (Opioid* Pain Medications).   Type of encounter (visit): For patients receiving controlled substances, face-to-face visits are required. (Not an option or up to the patient.)  Patient's responsibilities: 1. Pain Pills: Bring all pain pills to every appointment (except for procedure appointments). 2. Pill Bottles: Bring pills in original pharmacy bottle. Always bring the  newest bottle. Bring bottle, even if empty. 3. Medication refills: You are responsible for knowing and keeping track of what medications you take and those you need refilled. The day before your appointment: write a list of all prescriptions that need to be refilled. The day of the appointment: give the list to the admitting nurse. Prescriptions will be written only during appointments. No prescriptions will be written on procedure days. If you forget a medication: it will not be "Called in", "Faxed", or "electronically sent". You will need to get another appointment to get these prescribed. No early refills. Do not call asking to have your prescription filled early. 4. Prescription Accuracy: You are responsible for carefully inspecting your prescriptions before leaving our office. Have the discharge nurse carefully go over each prescription with you, before taking them home. Make sure that your name is accurately spelled, that your address is correct. Check the name and dose of your medication to make sure it is accurate. Check the number of pills, and the written instructions to make sure they are clear and accurate. Make sure that you are given enough medication to last until your next medication refill appointment. 5. Taking Medication: Take medication as prescribed. When it comes to controlled substances, taking less pills or less frequently than prescribed is permitted and encouraged. Never take more pills than instructed. Never take medication more frequently than prescribed.  6. Inform other Doctors: Always inform, all of your healthcare providers, of all the medications you take. 7. Pain Medication from other Providers: You are not allowed to accept any additional pain  medication from any other Doctor or Healthcare provider. There are two exceptions to this rule. (see below) In the event that you require additional pain medication, you are responsible for notifying us, as stated below. 8. Cough  Medicine: Often these contain an opioid, such as codeine or hydrocodone. Never accept or take cough medicine containing these opioids if you are already taking an opioid* medication. The combination may cause respiratory failure and death. 9. Medication Agreement: You are responsible for carefully reading and following our Medication Agreement. This must be signed before receiving any prescriptions from our practice. Safely store a copy of your signed Agreement. Violations to the Agreement will result in no further prescriptions. (Additional copies of our Medication Agreement are available upon request.) 10. Laws, Rules, & Regulations: All patients are expected to follow all Federal and [employer], [employer], Rules, [employer]. Ignorance of the Laws does not constitute a valid excuse.  11. Illegal drugs and Controlled Substances: The use of illegal substances (including, but not limited to marijuana and its derivatives) and/or the illegal use of any controlled substances is strictly prohibited. Violation of this rule may result in the immediate and permanent discontinuation of any and all prescriptions being written by our practice. The use of any illegal substances is prohibited. 12. Adopted CDC guidelines & recommendations: Target dosing levels will be at or below 60 MME/day. Use of benzodiazepines** is not recommended.  Exceptions: There are only two exceptions to the rule of not receiving pain medications from other Healthcare Providers. 1. Exception #1 (Emergencies): In the event of an emergency (i.e.: accident requiring emergency care), you are allowed to receive additional pain medication. However, you are responsible for: As soon as you are able, call our office [phone], at any time of the day or night, and leave a message stating your name, the date and nature of the emergency, and the name and dose of the medication prescribed. In the event that your call is answered by a member of our  staff, make sure to document and save the date, time, and the name of the person that took your information.  2. Exception #2 (Planned Surgery): In the event that you are scheduled by another doctor or dentist to have any type of surgery or procedure, you are allowed (for a period no longer than 30 days), to receive additional pain medication, for the acute post-op pain. However, in this case, you are responsible for picking up a copy of our "Post-op Pain Management for Surgeons" handout, and giving it to your surgeon or dentist. This document is available at our office, and does not require an appointment to obtain it. Simply go to our office during business hours (Monday-Thursday from 8:00 AM to 4:00 PM) (Friday 8:00 AM to 12:00 Noon) or if you have a scheduled appointment with [place], prior to your surgery, and ask for it by name. In addition, you are responsible for: calling our office [phone], at any time of the day or night, and leaving a message stating your name, name of your surgeon, type of surgery, and date of procedure or surgery. Failure to comply with your responsibilities may result in termination of therapy involving the controlled substances.  *Opioid medications include: morphine, codeine, oxycodone, oxymorphone, hydrocodone, hydromorphone, meperidine, tramadol, tapentadol, buprenorphine, fentanyl, methadone. **Benzodiazepine medications include: diazepam (Valium), alprazolam (Xanax), clonazepam (Klonopine), lorazepam (Ativan), clorazepate (Tranxene), chlordiazepoxide (Librium), estazolam (Prosom), oxazepam (Serax), temazepam (Restoril), triazolam (Halcion) (Last updated: 10/03/2020) ____________________________________________________________________________________________   ____________________________________________________________________________________________  Medication Recommendations and  Reminders  Applies to: All patients receiving prescriptions (written and/or  electronic).  Medication Rules & Regulations: These rules and regulations exist for your safety and that of others. They are not flexible and neither are we. Dismissing or ignoring them will be considered "non-compliance" with medication therapy, resulting in complete and irreversible termination of such therapy. (See document titled "Medication Rules" for more details.) In all conscience, because of safety reasons, we cannot continue providing a therapy where the patient does not follow instructions.  Pharmacy of record:   Definition: This is the pharmacy where your electronic prescriptions will be sent.   We do not endorse any particular pharmacy, however, we have experienced problems with [facility] not securing enough medication supply for the community.  We do not restrict you in your choice of pharmacy. However, once we write for your prescriptions, we will NOT be re-sending more prescriptions to fix restricted supply problems created by your pharmacy, or your insurance.   The pharmacy listed in the electronic medical record should be the one where you want electronic prescriptions to be sent.  If you choose to change pharmacy, simply notify our nursing staff.  Recommendations:  Keep all of your pain medications in a safe place, under lock and key, even if you live alone. We will NOT replace lost, stolen, or damaged medication.  After you fill your prescription, take 1 week's worth of pills and put them away in a safe place. You should keep a separate, properly labeled bottle for this purpose. The remainder should be kept in the original bottle. Use this as your primary supply, until it runs out. Once it's gone, then you know that you have 1 week's worth of medicine, and it is time to come in for a prescription refill. If you do this correctly, it is unlikely that you will ever run out of medicine.  To make sure that the above recommendation works, it is very important that you make sure  your medication refill appointments are scheduled at least 1 week before you run out of medicine. To do this in an effective manner, make sure that you do not leave the office without scheduling your next medication management appointment. Always ask the nursing staff to show you in your prescription , when your medication will be running out. Then arrange for the receptionist to get you a return appointment, at least 7 days before you run out of medicine. Do not wait until you have 1 or 2 pills left, to come in. This is very poor planning and does not take into consideration that we may need to cancel appointments due to bad weather, sickness, or emergencies affecting our staff.  DO NOT ACCEPT A "Partial Fill": If for any reason your pharmacy does not have enough pills/tablets to completely fill or refill your prescription, do not allow for a "partial fill". The law allows the pharmacy to complete that prescription within 72 hours, without requiring a new prescription. If they do not fill the rest of your prescription within those 72 hours, you will need a separate prescription to fill the remaining amount, which we will NOT provide. If the reason for the partial fill is your insurance, you will need to talk to the pharmacist about payment alternatives for the remaining tablets, but again, DO NOT ACCEPT A PARTIAL FILL, unless you can trust your pharmacist to obtain the remainder of the pills within 72 hours.  Prescription refills and/or changes in medication(s):   Prescription refills, and/or changes  in dose or medication, will be conducted only during scheduled medication management appointments. (Applies to both, written and electronic prescriptions.)  No refills on procedure days. No medication will be changed or started on procedure days. No changes, adjustments, and/or refills will be conducted on a procedure day. Doing so will interfere with the diagnostic portion of the procedure.  No phone  refills. No medications will be "called into the pharmacy".  No Fax refills.  No weekend refills.  No Holliday refills.  No after hours refills.  Remember:  Business hours are:  Monday to Thursday 8:00 AM to 4:00 PM Provider's Schedule: Shannon Pointer, MD - Appointments are:  Medication management: Monday and Wednesday 8:00 AM to 4:00 PM Procedure day: Tuesday and Thursday 7:30 AM to 4:00 PM Shannon Santa, MD - Appointments are:  Medication management: Tuesday and Thursday 8:00 AM to 4:00 PM Procedure day: Monday and Wednesday 7:30 AM to 4:00 PM (Last update: 05/25/2020) ____________________________________________________________________________________________   ____________________________________________________________________________________________  CBD (cannabidiol) WARNING  Applicable to: All individuals currently taking or considering taking CBD (cannabidiol) and, more important, all patients taking opioid analgesic controlled substances (pain medication). (Example: oxycodone; oxymorphone; hydrocodone; hydromorphone; morphine; methadone; tramadol; tapentadol; fentanyl; buprenorphine; butorphanol; dextromethorphan; meperidine; codeine; etc.)  Legal status: CBD remains a Schedule I drug prohibited for any use. CBD is illegal with one exception. In the [place], CBD has a limited Transport planner (FDA) approval for the treatment of two specific types of epilepsy disorders. Only one CBD product has been approved by the FDA for this purpose: "Epidiolex". FDA is aware that some companies are marketing products containing cannabis and cannabis-derived compounds in ways that violate the [employer], Drug and Cosmetic Act [facility] Act) and that may put the health and safety of consumers at risk. The FDA, a Federal agency, has not enforced the CBD status since 2018.   Legality: Some manufacturers ship CBD products nationally, which is illegal. Often such products are  sold online and are therefore available throughout the country. CBD is openly sold in head shops and health food stores in some states where such sales have not been explicitly legalized. Selling unapproved products with unsubstantiated therapeutic claims is not only a violation of the law, but also can put patients at risk, as these products have not been proven to be safe or effective. Federal illegality makes it difficult to conduct research on CBD.  Reference: "FDA Regulation of Cannabis and Cannabis-Derived Products, Including Cannabidiol (CBD)" - [employer]  Warning: CBD is not FDA approved and has not undergo the same manufacturing controls as prescription drugs.  This means that the purity and safety of available CBD may be questionable. Most of the time, despite manufacturer's claims, it is contaminated with THC (delta-9-tetrahydrocannabinol - the chemical in marijuana responsible for the "HIGH").  When this is the case, the [facility] contaminant will trigger a positive urine drug screen (UDS) test for Marijuana (carboxy-THC). Because a positive UDS for any illicit substance is a violation of our medication agreement, your opioid analgesics (pain medicine) may be permanently discontinued.  MORE ABOUT CBD  General Information: CBD  is a derivative of the Marijuana (cannabis sativa) plant discovered in 74. It is one of the 113 identified substances found in Marijuana. It accounts for up to 40% of the plant's extract. As of 2018, preliminary clinical studies on CBD included research for the treatment of anxiety, movement disorders, and pain. CBD is available and consumed in multiple forms, including inhalation of smoke or vapor,  as an aerosol spray, and by mouth. It may be supplied as an oil containing CBD, capsules, dried cannabis, or as a liquid solution. CBD is thought not to be as  psychoactive as THC (delta-9-tetrahydrocannabinol - the chemical in marijuana responsible for the "HIGH"). Studies suggest that CBD may interact with different biological target receptors in the body, including cannabinoid and other neurotransmitter receptors. As of 2018 the mechanism of action for its biological effects has not been determined.  Side-effects  Adverse reactions: Dry mouth, diarrhea, decreased appetite, fatigue, drowsiness, malaise, weakness, sleep disturbances, and others.  Drug interactions: CBC may interact with other medications such as blood-thinners. (Last update: 06/11/2020) ____________________________________________________________________________________________   ____________________________________________________________________________________________  Drug Holidays (Slow)  What is a "Drug Holiday"? Drug Holiday: is the name given to the period of time during which a patient stops taking a medication(s) for the purpose of eliminating tolerance to the drug.  Benefits . Improved effectiveness of opioids. . Decreased opioid dose needed to achieve benefits. . Improved pain with lesser dose.  What is tolerance? Tolerance: is the progressive decreased in effectiveness of a drug due to its repetitive use. With repetitive use, the body gets use to the medication and as a consequence, it loses its effectiveness. This is a common problem seen with opioid pain medications. As a result, a larger dose of the drug is needed to achieve the same effect that used to be obtained with a smaller dose.  How long should a "Drug Holiday" last? You should stay off of the pain medicine for at least 14 consecutive days. (2 weeks)  Should I stop the medicine "cold [place]"? No. You should always coordinate with your Pain Specialist so that he/she can provide you with the correct medication dose to make the transition as smoothly as possible.  How do I stop the medicine? Slowly. You  will be instructed to decrease the daily amount of pills that you take by one (1) pill every seven (7) days. This is called a "slow downward taper" of your dose. For example: if you normally take four (4) pills per day, you will be asked to drop this dose to three (3) pills per day for seven (7) days, then to two (2) pills per day for seven (7) days, then to one (1) per day for seven (7) days, and at the end of those last seven (7) days, this is when the "Drug Holiday" would start.   Will I have withdrawals? By doing a "slow downward taper" like this one, it is unlikely that you will experience any significant withdrawal symptoms. Typically, what triggers withdrawals is the sudden stop of a high dose opioid therapy. Withdrawals can usually be avoided by slowly decreasing the dose over a prolonged period of time. If you do not follow these instructions and decide to stop your medication abruptly, withdrawals may be possible.  What are withdrawals? Withdrawals: refers to the wide range of symptoms that occur after stopping or dramatically reducing opiate drugs after heavy and prolonged use. Withdrawal symptoms do not occur to patients that use low dose opioids, or those who take the medication sporadically. Contrary to benzodiazepine (example: Valium, Xanax, etc.) or alcohol withdrawals ("Delirium Tremens"), opioid withdrawals are not lethal. Withdrawals are the physical manifestation of the body getting rid of the excess receptors.  Expected Symptoms Early symptoms of withdrawal may include: . Agitation . Anxiety . Muscle aches . Increased tearing . Insomnia . Runny nose . Sweating . Yawning  Late symptoms of withdrawal  may include: . Abdominal cramping . Diarrhea . Dilated pupils . Goose bumps . Nausea . Vomiting  Will I experience withdrawals? Due to the slow nature of the taper, it is very unlikely that you will experience any.  What is a slow taper? Taper: refers to the gradual  decrease in dose.  (Last update: 05/25/2020) ____________________________________________________________________________________________

## 2021-04-05 NOTE — Progress Notes (Signed)
PROVIDER NOTE: Information contained herein reflects review and annotations entered in association with encounter. Interpretation of such information and data should be left to medically-trained personnel. Information provided to patient can be located elsewhere in the medical record under "Patient Instructions". Document created using STT-dictation technology, any transcriptional errors that may result from process are unintentional.    Patient: Shannon Benitez  Service Category: E/M  Provider: [name], MD  DOB: 01/08/1971  DOS: 04/05/2021  Specialty: Interventional Pain Management  MRN: [id number]  Setting: Ambulatory outpatient  PCP: [facility] [facility]  Type: Established Patient    Referring Provider: [facility] [facility]  Location: Office  Delivery: Face-to-face     HPI  Shannon Benitez, a 50 y.o. year old female, is here today because of her Chronic pain syndrome [G89.4]. Shannon Benitez primary complain today is Back Pain Last encounter: My last encounter with her was on 01/03/2021. Pertinent problems: Shannon Benitez has Multiple sclerosis ([place]); Neurogenic pain; Chronic sacroiliac joint pain (Bilateral) (L>R); Chronic pain syndrome; Chronic low back pain (1ry area of Pain) (Bilateral) (L>R) w/ sciatica (Bilateral); Chronic lower extremity pain (2ry area of Pain) (Bilateral) (R>L); Chronic low back pain (Bilateral) (L>R) w/o sciatica; Muscle spasticity; Spondylosis without myelopathy or radiculopathy, lumbosacral region; Lumbar facet syndrome (Bilateral) (R>L); Abnormal MRI, lumbar spine (06/16/2018); Lumbar facet hypertrophy (Multilevel) (Bilateral); Osteoarthritis of facet joint of lumbar spine; Osteoarthritis of lumbar spine; DDD (degenerative disc disease), lumbosacral; Lumbar spondylosis; Diabetic peripheral neuropathy ([place]); 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 Pain Assessment: Severity of Chronic pain is reported as a 7 /10. Location: Back Lower/buttocks bilateral  and down back of right leg above knee. Onset: More than a month ago. Quality: Aching,Throbbing,Radiating,Crying,Discomfort (doubled over). Timing: Constant. Modifying factor(s): Medications is not working well at this time. Vitals:  height is 5' 1"  (1.549 m) and weight is 205 lb (93 kg). Her temperature is 97.1 F (36.2 C) (abnormal). Her blood pressure is 145/98 (abnormal) and her pulse is 85. Her respiration is 16 and oxygen saturation is 99%.   Reason for encounter: medication management.   The patient indicates doing well with the current medication regimen. No adverse reactions or side effects reported to the medications.  The patient indicates having forgotten to bring her pills to be counted today.  I have reminded the patient that this is something that she needs to do every time that she is coming in for a medication management/refill.  The patient today indicates having a flareup of her low back pain.  She is known to have a bilateral lumbar facet syndrome that tends to respond well to nerve blocks and radiofrequency ablation.  Unfortunately, she has gained weight and her current BMI is 38.73 kg/m.  I have informed the patient that I will not be repeating her radiofrequency ablation until she brings her BMI below 35.  In her case in particular, I may have to bring it down further since she does have a lot of adipose tissue in the posterior aspect of her  lumbosacral area.  The patient understood and accepted and she indicated when she is working  to bring about some lifestyle changes that will hopefully help her lose this weight.  Meanwhile, we will continue to offer her palliative injections until we are able to then do the radiofrequency ablation.  RTCB: 07/04/2021 Nonopioids transfer 08/10/2020: Mobic, baclofen, and Lyrica.  Pharmacotherapy Assessment   Analgesic: Oxycodone IR 5 mg, 1 tab PO BID (10 mg/day of oxycodone)  MME/day:19m/day.   Monitoring:  PMP: PDMP reviewed during this encounter.       Pharmacotherapy: No side-effects or adverse reactions reported. Compliance: No problems identified. Effectiveness: Clinically acceptable.  GIgnatius Specking RN  04/05/2021  3:31 PM  Sign when Signing Visit Did not bring bottle or medication with her today. Last took medication this morning. Patient states that she did not know what her appointment was for today. Instructed to bring bottle to appointments.    UDS:  Summary  Date Value Ref Range Status  12/28/2020 Note  Final    Comment:    ==================================================================== [employer] [facility] (MW) ==================================================================== Test                             Result       Flag       Units  Drug Present and Declared for Prescription Verification   Oxycodone                      854          EXPECTED   ng/mg creat   Noroxycodone                   4742         EXPECTED   ng/mg creat    Sources of oxycodone include scheduled prescription medications.    Noroxycodone is an expected metabolite of oxycodone.    Phenobarbital                  PRESENT      EXPECTED    Phenobarbital is an expected metabolite of primidone; Phenobarbital    may also be administered as a prescription drug.  ==================================================================== Test                      Result    Flag   Units      Ref  Range   Creatinine              69               mg/dL      >=20 ==================================================================== Declared Medications:  The flagging and interpretation on this report are based on the  following declared medications.  Unexpected results may arise from  inaccuracies in the declared medications.   **Note: The testing scope of this panel includes these medications:   Oxycodone  Primidone (Mysoline)   **Note: The testing scope of this panel does not include the  following reported medications:   Acetaminophen (Excedrin)  Aspirin (Excedrin)  Baclofen (Lioresal)  Caffeine (Excedrin)  Duloxetine (Cymbalta)  Lamotrigine (Lamictal)  Levetiracetam (Keppra)  Loperamide  Meloxicam (Mobic)  Metformin  Mirabegron (Myrbetriq)  Omeprazole (Prilosec)  Pregabalin (Lyrica)  Propranolol (Inderal)  Sitagliptin (Januvia) ==================================================================== For clinical consultation, please call [phone] ====================================================================      ROS  Constitutional: Denies any fever or chills Gastrointestinal: No reported hemesis, hematochezia, vomiting, or acute GI distress Musculoskeletal: Denies any acute onset joint swelling, redness, loss of ROM, or  weakness Neurological: No reported episodes of acute onset apraxia, aphasia, dysarthria, agnosia, amnesia, paralysis, loss of coordination, or loss of consciousness  Medication Review  DULoxetine, Dimethyl Fumarate, aspirin-acetaminophen-caffeine, baclofen, lamoTRIgine, levETIRAcetam, loperamide, meloxicam, metFORMIN, mirabegron ER, omeprazole, oxyCODONE, pregabalin, primidone, propranolol, and sitaGLIPtin  History Review  Allergy: Shannon Benitez is allergic to 5-alpha reductase inhibitors, glatiramer, rebif [interferon beta-1a], and latex. Drug: Shannon Benitez  has no history on file for drug use. Alcohol:  reports no history of  alcohol use. Tobacco:  reports that she quit smoking about 4 years ago. Her smoking use included cigarettes. She has a 9.00 pack-year smoking history. She has quit using smokeless tobacco. Social: Shannon Benitez  reports that she quit smoking about 4 years ago. Her smoking use included cigarettes. She has a 9.00 pack-year smoking history. She has quit using smokeless tobacco. She reports that she does not drink alcohol. Medical:  has a past medical history of Acute postoperative pain (04/30/2019), Diabetes mellitus without complication ([place]), GERD (gastroesophageal reflux disease), Headache, MS (multiple sclerosis) (H[facility]), Numbness and tingling of both legs below knees, Osteoarthritis, Vertigo, and Wears dentures. Surgical: Shannon Benitez  has a past surgical history that includes Abdominal hysterectomy; Cesarean section; Tumor removal (Left); Cataract extraction w/PHACO (Right, 03/18/2019); and Cataract extraction w/PHACO (Left, 04/13/2019). Family: family history is not on file. She was adopted.  Laboratory Chemistry Profile   Renal Lab Results  Component Value Date   BUN 7 11/06/2020   CREATININE 0.61 11/06/2020   BCR 13 11/27/2018   GFRAA >60 09/25/2019   GFRNONAA >60 11/06/2020     Hepatic Lab Results  Component Value Date   AST 29 11/06/2020   ALT 18 11/06/2020   ALBUMIN 2.8 (L) 11/06/2020   ALKPHOS 101 11/06/2020     Electrolytes Lab Results  Component Value Date   NA 138 11/06/2020   K 3.8 11/06/2020   CL 101 11/06/2020   CALCIUM 8.7 (L) 11/06/2020   MG 2.2 11/27/2018     Bone Lab Results  Component Value Date   25OHVITD1 55 11/27/2018   25OHVITD2 <1.0 11/27/2018   25OHVITD3 55 11/27/2018     Inflammation (CRP: Acute Phase) (ESR: Chronic Phase) Lab Results  Component Value Date   CRP 10 11/27/2018   ESRSEDRATE 42 (H) 11/27/2018       Note: Above Lab results reviewed.  Recent Imaging Review  US Venous Img Lower Bilateral CLINICAL DATA:  Bilateral lower  extremity swelling  EXAM: BILATERAL LOWER EXTREMITY VENOUS DOPPLER ULTRASOUND  TECHNIQUE: Gray-scale sonography with compression, as well as color and duplex ultrasound, were performed to evaluate the deep venous system(s) from the level of the common femoral vein through the popliteal and proximal calf veins.  COMPARISON:  None.  FINDINGS: VENOUS  Normal compressibility of the common femoral, superficial femoral, and popliteal veins, as well as the visualized calf veins. Visualized portions of profunda femoral vein and great saphenous vein unremarkable. No filling defects to suggest DVT on grayscale or color Doppler imaging. Doppler waveforms show normal direction of venous flow, normal respiratory plasticity and response to augmentation.  Limited views of the contralateral common femoral vein are unremarkable.  OTHER  None.  Limitations: none  IMPRESSION: No lower extremity deep venous thrombosis.  Electronically Signed   By: Ulyses Jarred M.D.   On: 11/07/2020 00:47 Note: Reviewed        Physical Exam  General appearance: Well nourished, well developed, and well hydrated. In no apparent acute distress Mental status: Alert, oriented x  3 (person, place, & time)       Respiratory: No evidence of acute respiratory distress Eyes: PERLA Vitals: BP (!) 145/98   Pulse 85   Temp (!) 97.1 F (36.2 C)   Resp 16   Ht 5' 1"  (1.549 m)   Wt 205 lb (93 kg)   SpO2 99%   BMI 38.73 kg/m  BMI: Estimated body mass index is 38.73 kg/m as calculated from the following:   Height as of this encounter: 5' 1"  (1.549 m).   Weight as of this encounter: 205 lb (93 kg). Ideal: Ideal body weight: 47.8 kg (105 lb 6.1 oz) Adjusted ideal body weight: 65.9 kg (145 lb 3.6 oz)  Assessment   Status Diagnosis  Controlled Controlled Controlled 1. Chronic pain syndrome   2. Chronic low back pain (1ry area of Pain) (Bilateral) (L>R) w/ sciatica (Bilateral)   3. Chronic lower extremity  pain (2ry area of Pain) (Bilateral) (R>L)   4. Lumbar facet syndrome (Bilateral) (R>L)   5. Chronic hip pain (Bilateral)   6. Chronic knee pain (Bilateral)   7. Pharmacologic therapy   8. Chronic use of opiate for therapeutic purpose      Updated Problems: Problem  Chronic Use of Opiate for Therapeutic Purpose    Plan of Care  Problem-specific:  No problem-specific Assessment & Plan notes found for this encounter.  Shannon Benitez has a current medication list which includes the following long-term medication(s): baclofen, duloxetine, lamotrigine, levetiracetam, meloxicam, metformin, omeprazole, oxycodone, [START ON 05/05/2021] oxycodone, [START ON 06/04/2021] oxycodone, pregabalin, primidone, propranolol, and sitagliptin.  Pharmacotherapy (Medications Ordered): Meds ordered this encounter  Medications  . oxyCODONE (OXY IR/ROXICODONE) 5 MG immediate release tablet    Sig: Take 1 tablet (5 mg total) by mouth every 8 (eight) hours as needed for severe pain. Must last 30 days.    Dispense:  90 tablet    Refill:  0    Not a duplicate. Do NOT delete! Dispense 1 day early if closed on refill date. Avoid benzodiazepines within 8 hours of opioids. Do not send refill requests.  Marland Kitchen oxyCODONE (OXY IR/ROXICODONE) 5 MG immediate release tablet    Sig: Take 1 tablet (5 mg total) by mouth every 8 (eight) hours as needed for severe pain. Must last 30 days.    Dispense:  90 tablet    Refill:  0    Not a duplicate. Do NOT delete! Dispense 1 day early if closed on refill date. Avoid benzodiazepines within 8 hours of opioids. Do not send refill requests.  Marland Kitchen oxyCODONE (OXY IR/ROXICODONE) 5 MG immediate release tablet    Sig: Take 1 tablet (5 mg total) by mouth every 8 (eight) hours as needed for severe pain. Must last 30 days.    Dispense:  90 tablet    Refill:  0    Not a duplicate. Do NOT delete! Dispense 1 day early if closed on refill date. Avoid benzodiazepines within 8 hours of opioids. Do  not send refill requests.   Orders:  Orders Placed This Encounter  Procedures  . LUMBAR FACET(MEDIAL BRANCH NERVE BLOCK) MBNB    Standing Status:   Future    Standing Expiration Date:   05/05/2021    Scheduling Instructions:     Procedure: Lumbar facet block (AKA.: Lumbosacral medial branch nerve block)     Side: Bilateral     Level: L3-4, L4-5, & L5-S1 Facets (L2, L3, L4, L5, & S1 Medial Branch Nerves)     Sedation:  Patient's choice.     Timeframe: ASAA    Order Specific Question:   Where will this procedure be performed?    Answer:   [facility]   Follow-up plan:   Return in about 3 months (around 07/04/2021) for evaluation day (F2F) (MM), in addition, Procedure (w/ sedation): (B) L-FCT Blk .      Interventional management options: Considering:   NOTE: NO RFA until BMI is <30 Continue encouraging the patient to bring her BMI below 35. Diagnostic bilateral T11-12 lumbar facet block   Diagnostic right T7-8 TESI   Diagnostic right T7 TFESI   Diagnostic ML CESI   Possible candidate for intrathecal pump trial and implant    Palliative PRN treatment(s):   Palliative bilateral lumbar facet block #4  Palliative left lumbar facet RFA #4 (05/24/2020)  Palliative right lumbar facet RFA #4 (04/07/2020)     Recent Visits No visits were found meeting these conditions. Showing recent visits within past 90 days and meeting all other requirements Today's Visits Date Type Provider Dept  04/05/21 Office Visit Milinda Pointer, MD [facility]  Showing today's visits and meeting all other requirements Future Appointments No visits were found meeting these conditions. Showing future appointments within next 90 days and meeting all other requirements  I discussed the assessment and treatment plan with the patient. The patient was provided an opportunity to ask questions and all were answered. The patient agreed with the plan and demonstrated an understanding of the  instructions.  Patient advised to call back or seek an in-person evaluation if the symptoms or condition worsens.  Duration of encounter: 30 minutes.  Note by: [name], MD Date: 04/05/2021; Time: 3:49 PM

## 2021-04-11 ENCOUNTER — Ambulatory Visit: Payer: Medicaid Other | Admitting: Pain Medicine

## 2021-04-13 ENCOUNTER — Ambulatory Visit: Payer: Medicaid Other | Admitting: Pain Medicine

## 2021-04-20 ENCOUNTER — Ambulatory Visit (HOSPITAL_BASED_OUTPATIENT_CLINIC_OR_DEPARTMENT_OTHER): Payer: Medicaid Other | Admitting: Pain Medicine

## 2021-04-20 ENCOUNTER — Encounter: Payer: Self-pay | Admitting: Pain Medicine

## 2021-04-20 ENCOUNTER — Other Ambulatory Visit: Payer: Self-pay

## 2021-04-20 ENCOUNTER — Ambulatory Visit
Admission: RE | Admit: 2021-04-20 | Discharge: 2021-04-20 | Disposition: A | Discharge status: Home / Self Care | Payer: Medicaid Other | Source: Ambulatory Visit | Attending: Pain Medicine | Admitting: Pain Medicine

## 2021-04-20 VITALS — BP 127/56 | HR 72 | Temp 97.1°F | Resp 13 | Ht 61.0 in | Wt 205.0 lb

## 2021-04-20 DIAGNOSIS — Z6841 Body Mass Index (BMI) 40.0 and over, adult: Secondary | ICD-10-CM | POA: Insufficient documentation

## 2021-04-20 DIAGNOSIS — M47817 Spondylosis without myelopathy or radiculopathy, lumbosacral region: Secondary | ICD-10-CM | POA: Insufficient documentation

## 2021-04-20 DIAGNOSIS — M51379 Other intervertebral disc degeneration, lumbosacral region without mention of lumbar back pain or lower extremity pain: Secondary | ICD-10-CM

## 2021-04-20 DIAGNOSIS — M5137 Other intervertebral disc degeneration, lumbosacral region: Secondary | ICD-10-CM | POA: Insufficient documentation

## 2021-04-20 DIAGNOSIS — M47816 Spondylosis without myelopathy or radiculopathy, lumbar region: Secondary | ICD-10-CM | POA: Insufficient documentation

## 2021-04-20 DIAGNOSIS — G8929 Other chronic pain: Secondary | ICD-10-CM | POA: Diagnosis present

## 2021-04-20 DIAGNOSIS — M545 Low back pain, unspecified: Secondary | ICD-10-CM

## 2021-04-20 MED ORDER — TRIAMCINOLONE ACETONIDE 40 MG/ML IJ SUSP
80.0000 mg | Freq: Once | INTRAMUSCULAR | Status: AC
  Administered 2021-04-20: 80 mg
  Filled 2021-04-20: qty 2

## 2021-04-20 MED ORDER — LACTATED RINGERS IV SOLN
1000.0000 mL | Freq: Once | INTRAVENOUS | Status: AC
  Administered 2021-04-20: 1000 mL via INTRAVENOUS

## 2021-04-20 MED ORDER — ROPIVACAINE HCL 2 MG/ML IJ SOLN
INTRAMUSCULAR | Status: AC
  Filled 2021-04-20: qty 20

## 2021-04-20 MED ORDER — MIDAZOLAM HCL 5 MG/5ML IJ SOLN
1.0000 mg | INTRAMUSCULAR | Status: DC | PRN
  Administered 2021-04-20 (×2): 1 mg via INTRAVENOUS
  Filled 2021-04-20: qty 5

## 2021-04-20 MED ORDER — FENTANYL CITRATE (PF) 100 MCG/2ML IJ SOLN
25.0000 ug | INTRAMUSCULAR | Status: DC | PRN
Start: 2021-04-20 — End: 2021-04-20
  Filled 2021-04-20: qty 2

## 2021-04-20 MED ORDER — ROPIVACAINE HCL 2 MG/ML IJ SOLN
18.0000 mL | Freq: Once | INTRAMUSCULAR | Status: AC
Start: 2021-04-20 — End: 2021-04-20
  Administered 2021-04-20: 18 mL via PERINEURAL

## 2021-04-20 MED ORDER — LIDOCAINE HCL 2 % IJ SOLN
20.0000 mL | Freq: Once | INTRAMUSCULAR | Status: AC
  Administered 2021-04-20: 400 mg
  Filled 2021-04-20: qty 10

## 2021-04-20 NOTE — Progress Notes (Signed)
Safety precautions to be maintained throughout the outpatient stay will include: orient to surroundings, keep bed in low position, maintain call bell within reach at all times, provide assistance with transfer out of bed and ambulation.  

## 2021-04-20 NOTE — Progress Notes (Signed)
PROVIDER NOTE: Information contained herein reflects review and annotations entered in association with encounter. Interpretation of such information and data should be left to medically-trained personnel. Information provided to patient can be located elsewhere in the medical record under "Patient Instructions". Document created using STT-dictation technology, any transcriptional errors that may result from process are unintentional.    Patient: Shannon Benitez  Service Category: Procedure  Provider: [name], MD  DOB: Jul 02, 1971  DOS: 04/20/2021  Location: [facility]  MRN: [id number]  Setting: Ambulatory - outpatient  Referring Provider: Care, [place] [facility]  Type: Established Patient  Specialty: Interventional Pain Management  PCP: Care, [place] [facility]   Primary Reason for Visit: Interventional Pain Management Treatment. CC: Back Pain  Procedure:          Anesthesia, Analgesia, Anxiolysis:  Type: Lumbar Facet, Medial Branch Block(s) #4  Primary Purpose: Palliative Region: Posterolateral Lumbosacral Spine Level: L2, L3, L4, L5, & S1 Medial Branch Level(s). Injecting these levels blocks the L3-4, L4-5, and L5-S1 lumbar facet joints. Laterality: Bilateral  Type: Moderate (Conscious) Sedation combined with Local Anesthesia Indication(s): Analgesia and Anxiety Route: Intravenous (IV) IV Access: Secured Sedation: Meaningful verbal contact was maintained at all times during the procedure  Local Anesthetic: Lidocaine 1-2%  Position: Prone   Indications: 1. Lumbar facet syndrome (Bilateral) (R>L)   2. Spondylosis without myelopathy or radiculopathy, lumbosacral region   3. Lumbar facet hypertrophy (Multilevel) (Bilateral)   4. Osteoarthritis of facet joint of lumbar spine   5. DDD (degenerative disc disease), lumbosacral   6. Chronic low back pain (Bilateral) (L>R) w/o sciatica   7. Morbid obesity with BMI of 40.0-44.9, adult (HCC)    Pain  Score: Pre-procedure: 10-Worst pain ever/10 Post-procedure: 0-No pain/10   Pre-op H&P Assessment:  Shannon Benitez is a 50 y.o. (year old), female patient, seen today for interventional treatment. She  has a past surgical history that includes Abdominal hysterectomy; Cesarean section; Tumor removal (Left); Cataract extraction w/PHACO (Right, 03/18/2019); and Cataract extraction w/PHACO (Left, 04/13/2019). Shannon Benitez has a current medication list which includes the following prescription(s): aspirin-acetaminophen-caffeine, dimethyl fumarate, duloxetine, lamotrigine, loperamide, mirabegron er, oxycodone, [START ON 05/05/2021] oxycodone, [START ON 06/04/2021] oxycodone, propranolol, baclofen, levetiracetam, meloxicam, metformin, omeprazole, pregabalin, primidone, and sitagliptin, and the following Facility-Administered Medications: fentanyl and midazolam. Her primarily concern today is the Back Pain  Initial Vital Signs:  Pulse/HCG Rate: 71ECG Heart Rate: 72 Temp: (!) 97.1 F (36.2 C) Resp: 16 BP: (!) 118/91 SpO2: 96 %  BMI: Estimated body mass index is 38.73 kg/m as calculated from the following:   Height as of this encounter: 5\' 1"  (1.549 m).   Weight as of this encounter: 205 lb (93 kg).  Risk Assessment: Allergies: Reviewed. She is allergic to 5-alpha reductase inhibitors, glatiramer, rebif [interferon beta-1a], and latex.  Allergy Precautions: None required Coagulopathies: Reviewed. None identified.  Blood-thinner therapy: None at this time Active Infection(s): Reviewed. None identified. Shannon Benitez is afebrile  Site Confirmation: Shannon Benitez was asked to confirm the procedure and laterality before marking the site Procedure checklist: Completed Consent: Before the procedure and under the influence of no sedative(s), amnesic(s), or anxiolytics, the patient was informed of the treatment options, risks and possible complications. To fulfill our ethical and legal obligations, as  recommended by the American Medical Association's Code of Ethics, I have informed the patient of my clinical impression; the nature and purpose of the treatment or procedure; the risks, benefits, and possible complications of the intervention; the alternatives, including doing  nothing; the risk(s) and benefit(s) of the alternative treatment(s) or procedure(s); and the risk(s) and benefit(s) of doing nothing. The patient was provided information about the general risks and possible complications associated with the procedure. These may include, but are not limited to: failure to achieve desired goals, infection, bleeding, organ or nerve damage, allergic reactions, paralysis, and death. In addition, the patient was informed of those risks and complications associated to Spine-related procedures, such as failure to decrease pain; infection (i.e.: Meningitis, epidural or intraspinal abscess); bleeding (i.e.: epidural hematoma, subarachnoid hemorrhage, or any other type of intraspinal or peri-dural bleeding); organ or nerve damage (i.e.: Any type of peripheral nerve, nerve root, or spinal cord injury) with subsequent damage to sensory, motor, and/or autonomic systems, resulting in permanent pain, numbness, and/or weakness of one or several areas of the body; allergic reactions; (i.e.: anaphylactic reaction); and/or death. Furthermore, the patient was informed of those risks and complications associated with the medications. These include, but are not limited to: allergic reactions (i.e.: anaphylactic or anaphylactoid reaction(s)); adrenal axis suppression; blood sugar elevation that in diabetics may result in ketoacidosis or comma; water retention that in patients with history of congestive heart failure may result in shortness of breath, pulmonary edema, and decompensation with resultant heart failure; weight gain; swelling or edema; medication-induced neural toxicity; particulate matter embolism and blood vessel  occlusion with resultant organ, and/or nervous system infarction; and/or aseptic necrosis of one or more joints. Finally, the patient was informed that Medicine is not an exact science; therefore, there is also the possibility of unforeseen or unpredictable risks and/or possible complications that may result in a catastrophic outcome. The patient indicated having understood very clearly. We have given the patient no guarantees and we have made no promises. Enough time was given to the patient to ask questions, all of which were answered to the patient's satisfaction. Ms. Demeter has indicated that she wanted to continue with the procedure. Attestation: I, the ordering provider, attest that I have discussed with the patient the benefits, risks, side-effects, alternatives, likelihood of achieving goals, and potential problems during recovery for the procedure that I have provided informed consent. Date  Time: 04/20/2021 10:03 AM  Pre-Procedure Preparation:  Monitoring: As per clinic protocol. Respiration, ETCO2, SpO2, BP, heart rate and rhythm monitor placed and checked for adequate function Safety Precautions: Patient was assessed for positional comfort and pressure points before starting the procedure. Time-out: I initiated and conducted the "Time-out" before starting the procedure, as per protocol. The patient was asked to participate by confirming the accuracy of the "Time Out" information. Verification of the correct person, site, and procedure were performed and confirmed by me, the nursing staff, and the patient. "Time-out" conducted as per Joint Commission's Universal Protocol (UP.01.01.01). Time: 1128  Description of Procedure:          Laterality: Bilateral. The procedure was performed in identical fashion on both sides. Levels:  L2, L3, L4, L5, & S1 Medial Branch Level(s) Area Prepped: Posterior Lumbosacral Region DuraPrep (Iodine Povacrylex [0.7% available iodine] and Isopropyl Alcohol,  74% w/w) Safety Precautions: Aspiration looking for blood return was conducted prior to all injections. At no point did we inject any substances, as a needle was being advanced. Before injecting, the patient was told to immediately notify me if she was experiencing any new onset of "ringing in the ears, or metallic taste in the mouth". No attempts were made at seeking any paresthesias. Safe injection practices and needle disposal techniques used. Medications properly checked  for expiration dates. SDV (single dose vial) medications used. After the completion of the procedure, all disposable equipment used was discarded in the proper designated medical waste containers. Local Anesthesia: Protocol guidelines were followed. The patient was positioned over the fluoroscopy table. The area was prepped in the usual manner. The time-out was completed. The target area was identified using fluoroscopy. A 12-in long, straight, sterile hemostat was used with fluoroscopic guidance to locate the targets for each level blocked. Once located, the skin was marked with an approved surgical skin marker. Once all sites were marked, the skin (epidermis, dermis, and hypodermis), as well as deeper tissues (fat, connective tissue and muscle) were infiltrated with a small amount of a short-acting local anesthetic, loaded on a 10cc syringe with a 25G, 1.5-in  Needle. An appropriate amount of time was allowed for local anesthetics to take effect before proceeding to the next step. Local Anesthetic: Lidocaine 2.0% The unused portion of the local anesthetic was discarded in the proper designated containers. Technical explanation of process:  L2 Medial Branch Nerve Block (MBB): The target area for the L2 medial branch is at the junction of the postero-lateral aspect of the superior articular process and the superior, posterior, and medial edge of the transverse process of L3. Under fluoroscopic guidance, a Quincke needle was inserted until  contact was made with os over the superior postero-lateral aspect of the pedicular shadow (target area). After negative aspiration for blood, 0.5 mL of the nerve block solution was injected without difficulty or complication. The needle was removed intact. L3 Medial Branch Nerve Block (MBB): The target area for the L3 medial branch is at the junction of the postero-lateral aspect of the superior articular process and the superior, posterior, and medial edge of the transverse process of L4. Under fluoroscopic guidance, a Quincke needle was inserted until contact was made with os over the superior postero-lateral aspect of the pedicular shadow (target area). After negative aspiration for blood, 0.5 mL of the nerve block solution was injected without difficulty or complication. The needle was removed intact. L4 Medial Branch Nerve Block (MBB): The target area for the L4 medial branch is at the junction of the postero-lateral aspect of the superior articular process and the superior, posterior, and medial edge of the transverse process of L5. Under fluoroscopic guidance, a Quincke needle was inserted until contact was made with os over the superior postero-lateral aspect of the pedicular shadow (target area). After negative aspiration for blood, 0.5 mL of the nerve block solution was injected without difficulty or complication. The needle was removed intact. L5 Medial Branch Nerve Block (MBB): The target area for the L5 medial branch is at the junction of the postero-lateral aspect of the superior articular process and the superior, posterior, and medial edge of the sacral ala. Under fluoroscopic guidance, a Quincke needle was inserted until contact was made with os over the superior postero-lateral aspect of the pedicular shadow (target area). After negative aspiration for blood, 0.5 mL of the nerve block solution was injected without difficulty or complication. The needle was removed intact. S1 Medial Branch Nerve  Block (MBB): The target area for the S1 medial branch is at the posterior and inferior 6 o'clock position of the L5-S1 facet joint. Under fluoroscopic guidance, the Quincke needle inserted for the L5 MBB was redirected until contact was made with os over the inferior and postero aspect of the sacrum, at the 6 o' clock position under the L5-S1 facet joint (Target area). After negative  aspiration for blood, 0.5 mL of the nerve block solution was injected without difficulty or complication. The needle was removed intact.  Nerve block solution: 0.2% PF-Ropivacaine + Triamcinolone (40 mg/mL) diluted to a final concentration of 4 mg of Triamcinolone/mL of Ropivacaine The unused portion of the solution was discarded in the proper designated containers. Procedural Needles: 22-gauge, 5-inch, Quincke needles used for all levels.  Once the entire procedure was completed, the treated area was cleaned, making sure to leave some of the prepping solution back to take advantage of its long term bactericidal properties.      Illustration of the posterior view of the lumbar spine and the posterior neural structures. Laminae of L2 through S1 are labeled. DPRL5, dorsal primary ramus of L5; DPRS1, dorsal primary ramus of S1; DPR3, dorsal primary ramus of L3; FJ, facet (zygapophyseal) joint L3-L4; I, inferior articular process of L4; LB1, lateral branch of dorsal primary ramus of L1; IAB, inferior articular branches from L3 medial branch (supplies L4-L5 facet joint); IBP, intermediate branch plexus; MB3, medial branch of dorsal primary ramus of L3; NR3, third lumbar nerve root; S, superior articular process of L5; SAB, superior articular branches from L4 (supplies L4-5 facet joint also); TP3, transverse process of L3.  Vitals:   04/20/21 1140 04/20/21 1145 04/20/21 1155 04/20/21 1205  BP: 118/82 126/75 121/77 (!) 127/56  Pulse: 72     Resp: 16 17 17 13   Temp:      TempSrc:      SpO2: 96% 97% 93% 99%  Weight:       Height:         Start Time: 1128 hrs. End Time: 1140 hrs.  Imaging Guidance (Spinal):          Type of Imaging Technique: Fluoroscopy Guidance (Spinal) Indication(s): Assistance in needle guidance and placement for procedures requiring needle placement in or near specific anatomical locations not easily accessible without such assistance. Exposure Time: Please see nurses notes. Contrast: None used. Fluoroscopic Guidance: I was personally present during the use of fluoroscopy. "Tunnel Vision Technique" used to obtain the best possible view of the target area. Parallax error corrected before commencing the procedure. "Direction-depth-direction" technique used to introduce the needle under continuous pulsed fluoroscopy. Once target was reached, antero-posterior, oblique, and lateral fluoroscopic projection used confirm needle placement in all planes. Images permanently stored in EMR. Interpretation: No contrast injected. I personally interpreted the imaging intraoperatively. Adequate needle placement confirmed in multiple planes. Permanent images saved into the patient's record.  Antibiotic Prophylaxis:   Anti-infectives (From admission, onward)    None      Indication(s): None identified  Post-operative Assessment:  Post-procedure Vital Signs:  Pulse/HCG Rate: 7272 Temp: (!) 97.1 F (36.2 C) Resp: 13 BP: (!) 127/56 SpO2: 99 %  EBL: None  Complications: No immediate post-treatment complications observed by team, or reported by patient.  Note: The patient tolerated the entire procedure well. A repeat set of vitals were taken after the procedure and the patient was kept under observation following institutional policy, for this type of procedure. Post-procedural neurological assessment was performed, showing return to baseline, prior to discharge. The patient was provided with post-procedure discharge instructions, including a section on how to identify potential problems. Should  any problems arise concerning this procedure, the patient was given instructions to immediately contact us, at any time, without hesitation. In any case, we plan to contact the patient by telephone for a follow-up status report regarding this interventional procedure.  Comments:  No  additional relevant information.  Plan of Care  Orders:  Orders Placed This Encounter  Procedures   LUMBAR FACET(MEDIAL BRANCH NERVE BLOCK) MBNB    Scheduling Instructions:     Procedure: Lumbar facet block (AKA.: Lumbosacral medial branch nerve block)     Side: Bilateral     Level: L3-4, L4-5, & L5-S1 Facets (L2, L3, L4, L5, & S1 Medial Branch Nerves)     Sedation: Patient's choice.     Timeframe: Today    Order Specific Question:   Where will this procedure be performed?    Answer:   [facility]   DG PAIN [facility] C-ARM 1-60 MIN NO REPORT    Intraoperative interpretation by procedural physician at [place].    Standing Status:   Standing    Number of Occurrences:   1    Order Specific Question:   Reason for exam:    Answer:   Assistance in needle guidance and placement for procedures requiring needle placement in or near specific anatomical locations not easily accessible without such assistance.   Informed Consent Details: Physician/Practitioner Attestation; Transcribe to consent form and obtain patient signature    Nursing Order: Transcribe to consent form and obtain patient signature. Note: Always confirm laterality of pain with Ms. Bord, before procedure.    Order Specific Question:   Physician/Practitioner attestation of informed consent for procedure/surgical case    Answer:   I, the physician/practitioner, attest that I have discussed with the patient the benefits, risks, side effects, alternatives, likelihood of achieving goals and potential problems during recovery for the procedure that I have provided informed consent.    Order Specific Question:   Procedure    Answer:    Lumbar Facet Block  under fluoroscopic guidance    Order Specific Question:   Physician/Practitioner performing the procedure    Answer:   Panhia Karl A. Dossie Arbour MD    Order Specific Question:   Indication/Reason    Answer:   Low Back Pain, with our without leg pain, due to Facet Joint Arthralgia (Joint Pain) Spondylosis (Arthritis of the Spine), without myelopathy or radiculopathy (Nerve Damage).   Provide equipment / supplies at bedside    "Block Tray" (Disposable  single use) Needle type: SpinalSpinal Amount/quantity: 5 Size: Medium (5-inch) Gauge: 22G    Standing Status:   Standing    Number of Occurrences:   1    Order Specific Question:   Specify    Answer:   Block Tray    Chronic Opioid Analgesic:  Oxycodone IR 5 mg, 1 tab PO BID (10 mg/day of oxycodone)  MME/day: 15 mg/day.   Medications ordered for procedure: Meds ordered this encounter  Medications   lidocaine (XYLOCAINE) 2 % (with pres) injection 400 mg   lactated ringers infusion 1,000 mL   midazolam (VERSED) 5 MG/5ML injection 1-2 mg    Make sure Flumazenil is available in the pyxis when using this medication. If oversedation occurs, administer 0.2 mg IV over 15 sec. If after 45 sec no response, administer 0.2 mg again over 1 min; may repeat at 1 min intervals; not to exceed 4 doses (1 mg)   fentaNYL (SUBLIMAZE) injection 25-50 mcg    Make sure Narcan is available in the pyxis when using this medication. In the event of respiratory depression (RR< 8/min): Titrate NARCAN (naloxone) in increments of 0.1 to 0.2 mg IV at 2-3 minute intervals, until desired degree of reversal.   ropivacaine (PF) 2 mg/mL (0.2%) (NAROPIN) injection 18 mL  triamcinolone acetonide (KENALOG-40) injection 80 mg    Medications administered: We administered lidocaine, lactated ringers, midazolam, ropivacaine (PF) 2 mg/mL (0.2%), and triamcinolone acetonide.  See the medical record for exact dosing, route, and time of administration.  Follow-up  plan:   Return in about 2 weeks (around 05/04/2021) for procedure day (afternoon VV) (PPE).      Interventional management options: Considering:   NOTE: NO RFA until BMI is <30 Continue encouraging the patient to bring her BMI below 35. Diagnostic bilateral T11-12 lumbar facet block   Diagnostic right T7-8 TESI   Diagnostic right T7 TFESI   Diagnostic ML CESI   Possible candidate for intrathecal pump trial and implant    Palliative PRN treatment(s):   Palliative bilateral lumbar facet block #4  Palliative left lumbar facet RFA #4 (05/24/2020)  Palliative right lumbar facet RFA #4 (04/07/2020)      Recent Visits Date Type Provider Dept  04/05/21 Office Visit Milinda Pointer, [place] recent visits within past 90 days and meeting all other requirements Today's Visits Date Type Provider Dept  04/20/21 Procedure visit Milinda Pointer, MD [facility]  Showing today's visits and meeting all other requirements Future Appointments Date Type Provider Dept  05/02/21 Appointment Milinda Pointer, MD [facility]  06/26/21 Appointment Milinda Pointer, MD [facility]  Showing future appointments within next 90 days and meeting all other requirements Disposition: Discharge home  Discharge (Date  Time): 04/20/2021; 1210 hrs.   Primary Care Physician: Care, [place] Location: [facility] Note by: [name], MD Date: 04/20/2021; Time: 12:21 PM  Disclaimer:  Medicine is not an Chief Strategy Officer. The only guarantee in medicine is that nothing is guaranteed. It is important to note that the decision to proceed with this intervention was based on the information collected from the patient. The Data and conclusions were drawn from the patient's questionnaire, the interview, and the physical examination. Because the information was provided in large part by the patient, it cannot be guaranteed that it  has not been purposely or unconsciously manipulated. Every effort has been made to obtain as much relevant data as possible for this evaluation. It is important to note that the conclusions that lead to this procedure are derived in large part from the available data. Always take into account that the treatment will also be dependent on availability of resources and existing treatment guidelines, considered by other Pain Management Practitioners as being common knowledge and practice, at the time of the intervention. For Medico-Legal purposes, it is also important to point out that variation in procedural techniques and pharmacological choices are the acceptable norm. The indications, contraindications, technique, and results of the above procedure should only be interpreted and judged by a Board-Certified Interventional Pain Specialist with extensive familiarity and expertise in the same exact procedure and technique.

## 2021-04-20 NOTE — Patient Instructions (Signed)

## 2021-04-21 ENCOUNTER — Telehealth: Payer: Self-pay

## 2021-04-21 NOTE — Telephone Encounter (Signed)
Post procedure phone call.  LM 

## 2021-05-01 NOTE — Progress Notes (Signed)
Patient: Shannon Benitez  Service Category: E/M  Provider: [name], MD  DOB: February 23, 1971  DOS: 05/02/2021  Location: Office  MRN: [id number]  Setting: Ambulatory outpatient  Referring Provider: [facility] [place] [facility]  Type: Established Patient  Specialty: Interventional Pain Management  PCP: [facility]  Location: Remote location  Delivery: TeleHealth     Virtual Encounter - Pain Management PROVIDER NOTE: Information contained herein reflects review and annotations entered in association with encounter. Interpretation of such information and data should be left to medically-trained personnel. Information provided to patient can be located elsewhere in the medical record under "Patient Instructions". Document created using STT-dictation technology, any transcriptional errors that may result from process are unintentional.    Contact & Pharmacy Preferred: [phone] Home: [phone] (home) Mobile: [phone] (mobile) E-mail: [email or web address], [place] [address] [place] [address] Phone: [phone] Fax: [phone]  [id number]: [phone]: [phone]  Pre-screening  [name] offered "in-person" vs "virtual" encounter. She indicated preferring virtual for this encounter.   Reason COVID-19*  Social distancing based on CDC and AMA recommendations.   I contacted Tiaira Donner on 05/02/2021 via telephone.      I clearly identified myself as FGaspar Cola MD. I verified that I was speaking with the correct person using two identifiers (Name: ENishita Isaacks and date of birth: 802/20/1972.  Consent I sought verbal advanced consent from [facility] interactions. I informed [name] of possible security and privacy concerns, risks, and limitations associated with providing "not-in-person"  medical evaluation and management services. I also informed [name] of the availability of "in-person" appointments. Finally, I informed her that there would be a charge for the virtual visit and that she could be  personally, fully or partially, financially responsible for it. Ms. WByingtonexpressed understanding and agreed to proceed.   Historic Elements   Ms. EBijou Easleris a 50y.o. year old, female patient evaluated today after our last contact on 04/20/2021. [name] has a past medical history of Acute postoperative pain (04/30/2019), Diabetes mellitus without complication ([place], GERD (gastroesophageal reflux disease), Headache, MS (multiple sclerosis) ([place], Numbness and tingling of both legs below knees, Osteoarthritis, Vertigo, and Wears dentures. She also  has a past surgical history that includes Abdominal hysterectomy; Cesarean section; Tumor removal (Left); Cataract extraction w/PHACO (Right, 03/18/2019); and Cataract extraction w/PHACO (Left, 04/13/2019). Ms. WKirkshas a current medication list which includes the following prescription(s): aspirin-acetaminophen-caffeine, baclofen, dimethyl fumarate, duloxetine, lamotrigine, levetiracetam, loperamide, meloxicam, metformin, mirabegron er, oxycodone, [START ON 05/05/2021] oxycodone, [START ON 06/04/2021] oxycodone, pregabalin, primidone, propranolol, sitagliptin, and omeprazole. She  reports that she quit smoking about 4 years ago. Her smoking use included cigarettes. She has a 9.00 pack-year smoking history. She has quit using smokeless tobacco. She reports that she does not drink alcohol. No history on file for drug use. Ms. WGaspariis allergic to 5-alpha reductase inhibitors, desvenlafaxine, glatiramer, rebif [interferon beta-1a], and latex.   HPI  Today, she is being contacted for a post-procedure assessment.  The patient indicates having attained a 100% relief of the pain for the duration of the local anesthetic  which then went down to an ongoing 65% improvement.  The patient indicates doing well with the current medication regimen. No adverse reactions or side effects reported to the medications.   Today the  patient indicated being amazed at what difference is between doing a one-sided facet block versus bilateral.  She indicated being a little skeptical about the procedure before undergoing it, but once we blocked both sides, she prefers noticing a huge difference.  At this point, she refers having finished with 2 of her biggest stressors (her kids) and being ready to move on.  She does understand that she needs to bring down her weight which is apparently about 220.  This means that her BMI is currently over 40 kg/m.  I reminded her that she needs to be below 35 in order for [place] to do her radiofrequency ablations.  She indicated that she would like to have another referral to bariatric surgery and medical weight management since she is ready for those now.  I had previously entered referrals on 08/10/2020, but she did not follow-up with those.  RTCB: 07/04/2021 Nonopioids transfer 08/10/2020: Mobic, baclofen, and Lyrica.  Post-Procedure Evaluation  Procedure (04/20/2021): Type: Lumbar Facet, Medial Branch Block(s) #4  Primary Purpose: Palliative Region: Posterolateral Lumbosacral Spine Level: L2, L3, L4, L5, & S1 Medial Branch Level(s). Injecting these levels blocks the L3-4, L4-5, and L5-S1 lumbar facet joints. Laterality: Bilateral Pre-procedure pain level: 10/10 Post-procedure: 0/10 (100% relief)  Anxiolysis: intravenous (aka.: minimal conscious sedation)  Effectiveness during initial hour after procedure (Ultra-Short Term Relief): 100 %.  Local anesthetic used: Long-acting (4-6 hours) Effectiveness: Defined as any analgesic benefit obtained secondary to the administration of local anesthetics. This carries significant diagnostic value as to the etiological location, or anatomical origin, of the pain.  Duration of benefit is expected to coincide with the duration of the local anesthetic used.  Effectiveness during initial 4-6 hours after procedure (Short-Term Relief): 100 %.  Long-term benefit: Defined as any relief past the pharmacologic duration of the local anesthetics.  Effectiveness past the initial 6 hours after procedure (Long-Term Relief): 65 % (ongoing).  Benefits, current: Defined as benefit present at the time of this evaluation.   Analgesia: The patient indicates currently enjoying an ongoing 65% relief of her low back pain from the palliative bilateral lumbar facet blocks. Function: Ms. Cosman reports improvement in function ROM: Ms. Dobosz reports improvement in ROM  Pharmacotherapy Assessment  Analgesic: Oxycodone IR 5 mg, 1 tab PO BID (10 mg/day of oxycodone)  MME/day: 15 mg/day.   Monitoring: [place] PMP: PDMP reviewed during this encounter.       Pharmacotherapy: No side-effects or adverse reactions reported. Compliance: No problems identified. Effectiveness: Clinically acceptable. Plan: Refer to "POC".  UDS:  Summary  Date Value Ref Range Status  12/28/2020 Note  Final    Comment:    ==================================================================== [employer] Select 13 (MW) ==================================================================== Test                             Result       Flag       Units  Drug Present and Declared for Prescription Verification   Oxycodone                      854          EXPECTED   ng/mg creat   Noroxycodone                   4742         EXPECTED   ng/mg creat    Sources of oxycodone include scheduled prescription medications.  Noroxycodone is an expected metabolite of oxycodone.    Phenobarbital                  PRESENT      EXPECTED    Phenobarbital is an expected metabolite of primidone; Phenobarbital    may also be administered as a prescription  drug.  ==================================================================== Test                      Result    Flag   Units      Ref Range   Creatinine              69               mg/dL      >=20 ==================================================================== Declared Medications:  The flagging and interpretation on this report are based on the  following declared medications.  Unexpected results may arise from  inaccuracies in the declared medications.   **Note: The testing scope of this panel includes these medications:   Oxycodone  Primidone (Mysoline)   **Note: The testing scope of this panel does not include the  following reported medications:   Acetaminophen (Excedrin)  Aspirin (Excedrin)  Baclofen (Lioresal)  Caffeine (Excedrin)  Duloxetine (Cymbalta)  Lamotrigine (Lamictal)  Levetiracetam (Keppra)  Loperamide  Meloxicam (Mobic)  Metformin  Mirabegron (Myrbetriq)  Omeprazole (Prilosec)  Pregabalin (Lyrica)  Propranolol (Inderal)  Sitagliptin (Januvia) ==================================================================== For clinical consultation, please call [phone]. ====================================================================     Laboratory Chemistry Profile   Renal Lab Results  Component Value Date   BUN 7 11/06/2020   CREATININE 0.61 11/06/2020   BCR 13 11/27/2018   GFRAA >60 09/25/2019   GFRNONAA >60 11/06/2020     Hepatic Lab Results  Component Value Date   AST 29 11/06/2020   ALT 18 11/06/2020   ALBUMIN 2.8 (L) 11/06/2020   ALKPHOS 101 11/06/2020     Electrolytes Lab Results  Component Value Date   NA 138 11/06/2020   K 3.8 11/06/2020   CL 101 11/06/2020   CALCIUM 8.7 (L) 11/06/2020   MG 2.2 11/27/2018     Bone Lab Results  Component Value Date   25OHVITD1 55 11/27/2018   25OHVITD2 <1.0 11/27/2018   25OHVITD3 55 11/27/2018     Inflammation (CRP: Acute Phase) (ESR: Chronic Phase) Lab Results  Component  Value Date   CRP 10 11/27/2018   ESRSEDRATE 42 (H) 11/27/2018       Note: Above Lab results reviewed.  Imaging  DG PAIN CLINIC C-ARM 1-60 MIN NO REPORT Fluoro was used, but no Radiologist interpretation will be provided.  Please refer to "NOTES" tab for provider progress note.  Assessment  The primary encounter diagnosis was Lumbar facet syndrome (Bilateral) (R>L). Diagnoses of Chronic low back pain (Bilateral) (L>R) w/o sciatica, Chronic lower extremity pain (2ry area of Pain) (Bilateral) (R>L), Chronic hip pain (Bilateral), Morbid obesity with BMI of 40.0-44.9, adult ([place]), and Chronic pain syndrome were also pertinent to this visit.  Plan of Care  Problem-specific:  No problem-specific Assessment & Plan notes found for this encounter.  Ms. Laquashia Mergenthaler has a current medication list which includes the following long-term medication(s): baclofen, duloxetine, lamotrigine, levetiracetam, meloxicam, metformin, oxycodone, [START ON 05/05/2021] oxycodone, [START ON 06/04/2021] oxycodone, pregabalin, primidone, propranolol, sitagliptin, and omeprazole.  Pharmacotherapy (Medications Ordered): No orders of the defined types were placed in this encounter.  Orders:  Orders Placed This Encounter  Procedures   Amb Ref to Medical Weight  Management    Referral Priority:   Routine    Referral Type:   Consultation    Referral Reason:   Specialty Services Required    Number of Visits Requested:   1   Amb Referral to Bariatric Surgery    Referral Priority:   Routine    Referral Type:   Consultation    Referral Reason:   Specialty Services Required    Number of Visits Requested:   1    Follow-up plan:   Return in 8 weeks (on 06/26/2021) for scheduled encounter on 06/26/2021.      Interventional Therapies  Risk  Complexity Considerations:   Estimated body mass index is 38.73 kg/m as calculated from the following:   Height as of 04/20/21: 5' 1" (1.549 m).   Weight as of 04/20/21: 205 lb  (93 kg). WNL   Planned  Pending:   Pending further evaluation   Under consideration:   NOTE: NO RFA until BMI is <30 Continue encouraging the patient to bring her BMI below 35. Diagnostic bilateral T11-12 lumbar facet block   Diagnostic right T7-8 TESI   Diagnostic right T7 TFESI   Diagnostic ML CESI   Possible candidate for intrathecal pump trial and implant    Completed:   Palliative bilateral lumbar facet block x4 (04/20/2021)  Therapeutic left lumbar facet RFA x3 (05/24/2020)  Therapeutic right lumbar facet RFA x3 (04/07/2020)    Therapeutic  Palliative (PRN) options:   Palliative bilateral lumbar facet block #5  Palliative left lumbar facet RFA #4 (05/24/2020)  Palliative right lumbar facet RFA #4 (04/07/2020)     Recent Visits Date Type Provider Dept  04/20/21 Procedure visit Milinda Pointer, MD [facility]  04/05/21 Office Visit Milinda Pointer, MD [facility]  Showing recent visits within past 90 days and meeting all other requirements Today's Visits Date Type Provider Dept  05/02/21 Telemedicine Milinda Pointer, MD [facility]  Showing today's visits and meeting all other requirements Future Appointments Date Type Provider Dept  06/26/21 Appointment Milinda Pointer, MD [facility]  Showing future appointments within next 90 days and meeting all other requirements I discussed the assessment and treatment plan with the patient. The patient was provided an opportunity to ask questions and all were answered. The patient agreed with the plan and demonstrated an understanding of the instructions.  Patient advised to call back or seek an in-person evaluation if the symptoms or condition worsens.  Duration of encounter: 12 minutes.  Note by: [name], MD Date: 05/02/2021; Time: 6:31 PM

## 2021-05-02 ENCOUNTER — Other Ambulatory Visit: Payer: Self-pay

## 2021-05-02 ENCOUNTER — Ambulatory Visit: Payer: Medicaid Other | Attending: Pain Medicine | Admitting: Pain Medicine

## 2021-05-02 DIAGNOSIS — M47816 Spondylosis without myelopathy or radiculopathy, lumbar region: Secondary | ICD-10-CM

## 2021-05-02 DIAGNOSIS — M545 Low back pain, unspecified: Secondary | ICD-10-CM | POA: Diagnosis not present

## 2021-05-02 DIAGNOSIS — M79604 Pain in right leg: Secondary | ICD-10-CM

## 2021-05-02 DIAGNOSIS — Z6841 Body Mass Index (BMI) 40.0 and over, adult: Secondary | ICD-10-CM

## 2021-05-02 DIAGNOSIS — M79605 Pain in left leg: Secondary | ICD-10-CM

## 2021-05-02 DIAGNOSIS — M25551 Pain in right hip: Secondary | ICD-10-CM

## 2021-05-02 DIAGNOSIS — G894 Chronic pain syndrome: Secondary | ICD-10-CM

## 2021-05-02 DIAGNOSIS — G8929 Other chronic pain: Secondary | ICD-10-CM

## 2021-05-02 DIAGNOSIS — M25552 Pain in left hip: Secondary | ICD-10-CM

## 2021-06-26 ENCOUNTER — Encounter: Payer: Medicaid Other | Admitting: Pain Medicine

## 2021-07-03 NOTE — Progress Notes (Signed)
PROVIDER NOTE: Information contained herein reflects review and annotations entered in association with encounter. Interpretation of such information and data should be left to medically-trained personnel. Information provided to patient can be located elsewhere in the medical record under "Patient Instructions". Document created using STT-dictation technology, any transcriptional errors that may result from process are unintentional.    Patient: Shannon Benitez  Service Category: E/M  Provider: [name], MD  DOB: August 27, 1971  DOS: 07/04/2021  Specialty: Interventional Pain Management  MRN: [id number]  Setting: Ambulatory outpatient  PCP: [facility]  Type: Established Patient    Referring Provider: [facility]  Location: Office  Delivery: Face-to-face     HPI  Shannon Benitez, a 50 y.o. year old female, is here today because of her Chronic pain syndrome [G89.4]. Shannon Benitez primary complain today is Back Pain (Lower/) Last encounter: My last encounter with her was on 04/20/2021. Pertinent problems: Shannon Benitez has Multiple sclerosis ([place]); Neurogenic pain; Chronic sacroiliac joint pain (Bilateral) (L>R); Chronic pain syndrome; Chronic low back pain (1ry area of Pain) (Bilateral) (L>R) w/ sciatica (Bilateral); Chronic lower extremity pain (2ry area of Pain) (Bilateral) (R>L); Chronic low back pain (Bilateral) (L>R) w/o sciatica; Muscle spasticity; Spondylosis without myelopathy or radiculopathy, lumbosacral region; Lumbar facet syndrome (Bilateral) (R>L); Abnormal MRI, lumbar spine (06/16/2018); Lumbar facet hypertrophy (Multilevel) (Bilateral); Osteoarthritis of facet joint of lumbar spine; Osteoarthritis of lumbar spine; DDD (degenerative disc disease), lumbosacral; Lumbar spondylosis; Diabetic peripheral neuropathy ([place]); 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 Pain Assessment: Severity of Chronic pain is reported as a 10-Worst pain ever/10. Location: Back Lower/pain radiaties down her right leg to her knee. Onset: More than a month ago. Quality: Aching, Burning, Constant, Shooting, Throbbing. Timing: Constant. Modifying factor(s): iec and heat, meds. Vitals:  height is 5' 1"  (1.549 m) and weight is 205 lb (93 kg). Her temporal temperature is 97.1 F (36.2 C) (abnormal). Her blood pressure is 125/65 and her pulse is 87. Her respiration is 16 and oxygen saturation is 98%.   Reason for encounter: medication management.   The patient indicates doing well with the current medication regimen. No adverse reactions or side effects reported to the medications.   Today the patient comes into the clinic indicating that her medication is no longer working for her.  This came in opportunity to talk to the patient about opioid tolerance and drug holidays.  I took the opportunity to explain the concept of tolerance and how "Drug Holidays" help control it.  I detailed how to do a slow taper to avoid withdrawals.   The patient recognizes the need to bring her BMI down and therefore today we have entered another referral for weight management.  I had previously done that, but for 1 reason or another she did not have that bariatric surgery and/or medical weight management encounter.  Today the patient requested another referral and we have done so.  She indicates that she would like to go to having primary care for her weight  management.  The patient indicated her interest in either increasing her dose or switching to another  medication.  Today I have declined that request and I have explained to her the reasons why.  Instead, we have decided to go ahead with a bilateral lumbar facet block which she had done in the past with good success.  I have explained to the patient today that doing this blocks could provide her with some short-term relief of the pain and decrease in the flareup of her low back pain.  However, in order for this patient to get long-term benefit we would need to do radiofrequency ablation and for that I need her to bring her BMI down to or below 30 kg/m.  Her current BMI is 38.73.  RTCB: 10/02/2021 Nonopioids transfer 08/10/2020: Mobic, baclofen, and Lyrica.  Addendum: Apparently [facility] is no longer offering the service of weight management.  My referral is open to have anybody offering the service and first available.  Pharmacotherapy Assessment  Analgesic: Oxycodone IR 5 mg, 1 tab PO BID (10 mg/day of oxycodone)  MME/day: 15 mg/day.   Monitoring: [place] PMP: PDMP reviewed during this encounter.       Pharmacotherapy: No side-effects or adverse reactions reported. Compliance: No problems identified. Effectiveness: Clinically acceptable.  Chauncey Fischer, RN  07/04/2021  2:45 PM  Sign when Signing Visit Nursing Pain Medication Assessment:  Safety precautions to be maintained throughout the outpatient stay will include: orient to surroundings, keep bed in low position, maintain call bell within reach at all times, provide assistance with transfer out of bed and ambulation.  Medication Inspection Compliance: Pill count conducted under aseptic conditions, in front of the patient. Neither the pills nor the bottle was removed from the patient's sight at any time. Once count was completed pills were immediately returned to the patient in their original bottle.  Medication: Oxycodone IR Pill/Patch  Count:  3 of 90 pills remain Pill/Patch Appearance: Markings consistent with prescribed medication Bottle Appearance: Standard pharmacy container. Clearly labeled. Filled Date: 7 / 30 / 2022 Last Medication intake:  Today Safety precautions to be maintained throughout the outpatient stay will include: orient to surroundings, keep bed in low position, maintain call bell within reach at all times, provide assistance with transfer out of bed and ambulation.      UDS:  Summary  Date Value Ref Range Status  12/28/2020 Note  Final    Comment:    ==================================================================== [employer] [facility] 13 (MW) ==================================================================== Test                             Result       Flag       Units  Drug Present and Declared for Prescription Verification   Oxycodone                      854          EXPECTED   ng/mg creat   Noroxycodone                   4742         EXPECTED   ng/mg creat    Sources of oxycodone include scheduled prescription medications.    Noroxycodone is an expected metabolite of oxycodone.    Phenobarbital                  PRESENT      EXPECTED    Phenobarbital is an expected metabolite of primidone; Phenobarbital    may also  be administered as a prescription drug.  ==================================================================== Test                      Result    Flag   Units      Ref Range   Creatinine              69               mg/dL      >=20 ==================================================================== Declared Medications:  The flagging and interpretation on this report are based on the  following declared medications.  Unexpected results may arise from  inaccuracies in the declared medications.   **Note: The testing scope of this panel includes these medications:   Oxycodone  Primidone (Mysoline)   **Note: The testing scope of this panel does not include the  following  reported medications:   Acetaminophen (Excedrin)  Aspirin (Excedrin)  Baclofen (Lioresal)  Caffeine (Excedrin)  Duloxetine (Cymbalta)  Lamotrigine (Lamictal)  Levetiracetam (Keppra)  Loperamide  Meloxicam (Mobic)  Metformin  Mirabegron (Myrbetriq)  Omeprazole (Prilosec)  Pregabalin (Lyrica)  Propranolol (Inderal)  Sitagliptin (Januvia) ==================================================================== For clinical consultation, please call [phone]. ====================================================================      ROS  Constitutional: Denies any fever or chills Gastrointestinal: No reported hemesis, hematochezia, vomiting, or acute GI distress Musculoskeletal: Denies any acute onset joint swelling, redness, loss of ROM, or weakness Neurological: No reported episodes of acute onset apraxia, aphasia, dysarthria, agnosia, amnesia, paralysis, loss of coordination, or loss of consciousness  Medication Review  DULoxetine, Dimethyl Fumarate, aspirin-acetaminophen-caffeine, baclofen, diazepam, lamoTRIgine, levETIRAcetam, loperamide, meloxicam, metFORMIN, mirabegron ER, omeprazole, oxyCODONE, pregabalin, primidone, propranolol, and sitaGLIPtin  History Review  Allergy: Ms. Fazzini is allergic to 5-alpha reductase inhibitors, desvenlafaxine, glatiramer, rebif [interferon beta-1a], and latex. Drug: Ms. Diemer  has no history on file for drug use. Alcohol:  reports no history of alcohol use. Tobacco:  reports that she quit smoking about 4 years ago. Her smoking use included cigarettes. She has a 9.00 pack-year smoking history. She has quit using smokeless tobacco. Social: Ms. Huneycutt  reports that she quit smoking about 4 years ago. Her smoking use included cigarettes. She has a 9.00 pack-year smoking history. She has quit using smokeless tobacco. She reports that she does not drink alcohol. Medical:  has a past medical history of Acute postoperative pain  (04/30/2019), Diabetes mellitus without complication ([place]), GERD (gastroesophageal reflux disease), Headache, MS (multiple sclerosis) ([facility]), Numbness and tingling of both legs below knees, Osteoarthritis, Vertigo, and Wears dentures. Surgical: Ms. Connelley  has a past surgical history that includes Abdominal hysterectomy; Cesarean section; Tumor removal (Left); Cataract extraction w/PHACO (Right, 03/18/2019); and Cataract extraction w/PHACO (Left, 04/13/2019). Family: family history is not on file. She was adopted.  Laboratory Chemistry Profile   Renal Lab Results  Component Value Date   BUN 7 11/06/2020   CREATININE 0.61 11/06/2020   BCR 13 11/27/2018   GFRAA >60 09/25/2019   GFRNONAA >60 11/06/2020    Hepatic Lab Results  Component Value Date   AST 29 11/06/2020   ALT 18 11/06/2020   ALBUMIN 2.8 (L) 11/06/2020   ALKPHOS 101 11/06/2020    Electrolytes Lab Results  Component Value Date   NA 138 11/06/2020   K 3.8 11/06/2020   CL 101 11/06/2020   CALCIUM 8.7 (L) 11/06/2020   MG 2.2 11/27/2018    Bone Lab Results  Component Value Date   25OHVITD1 55 11/27/2018   25OHVITD2 <1.0 11/27/2018   25OHVITD3 55 11/27/2018  Inflammation (CRP: Acute Phase) (ESR: Chronic Phase) Lab Results  Component Value Date   CRP 10 11/27/2018   ESRSEDRATE 42 (H) 11/27/2018         Note: Above Lab results reviewed.  Recent Imaging Review  DG PAIN CLINIC C-ARM 1-60 MIN NO REPORT Fluoro was used, but no Radiologist interpretation will be provided.  Please refer to "NOTES" tab for provider progress note. Note: Reviewed        Physical Exam  General appearance: Well nourished, well developed, and well hydrated. In no apparent acute distress Mental status: Alert, oriented x 3 (person, place, & time)       Respiratory: No evidence of acute respiratory distress Eyes: PERLA Vitals: BP 125/65 (BP Location: Right Arm, Patient Position: Sitting, Cuff Size: Large)   Pulse 87   Temp (!) 97.1 F  (36.2 C) (Temporal)   Resp 16   Ht 5' 1"  (1.549 m)   Wt 205 lb (93 kg)   SpO2 98%   BMI 38.73 kg/m  BMI: Estimated body mass index is 38.73 kg/m as calculated from the following:   Height as of this encounter: 5' 1"  (1.549 m).   Weight as of this encounter: 205 lb (93 kg). Ideal: Ideal body weight: 47.8 kg (105 lb 6.1 oz) Adjusted ideal body weight: 65.9 kg (145 lb 3.6 oz)  Assessment   Status Diagnosis  Controlled Controlled Controlled 1. Chronic pain syndrome   2. Chronic low back pain (1ry area of Pain) (Bilateral) (L>R) w/ sciatica (Bilateral)   3. Chronic lower extremity pain (2ry area of Pain) (Bilateral) (R>L)   4. Lumbar facet syndrome (Bilateral) (R>L)   5. DDD (degenerative disc disease), lumbosacral   6. Diabetic peripheral neuropathy ([place])   7. Anxiety due to invasive procedure   8. Morbid obesity with BMI of 40.0-44.9, adult ([place])   9. Pharmacologic therapy   10. Chronic use of opiate for therapeutic purpose   11. Encounter for medication management   12. 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  Shannon Benitez has a current medication list which includes the following long-term medication(s): duloxetine, lamotrigine, oxycodone, [START ON 08/03/2021] oxycodone, [START ON 09/02/2021] oxycodone, propranolol, baclofen, levetiracetam, meloxicam, metformin, omeprazole, pregabalin, primidone, and sitagliptin.  Pharmacotherapy (Medications Ordered): Meds ordered this encounter  Medications   oxyCODONE (OXY IR/ROXICODONE) 5 MG immediate release tablet    Sig: Take 1 tablet (5 mg total) by mouth every 8 (eight) hours as needed for severe pain. Must last 30 days.    Dispense:  90 tablet    Refill:  0    Not a duplicate. Do NOT delete! Dispense 1 day early if closed on refill date. Avoid  benzodiazepines within 8 hours of opioids. Do not send refill requests.   oxyCODONE (OXY IR/ROXICODONE) 5 MG immediate release tablet    Sig: Take 1 tablet (5 mg total) by mouth every 8 (eight) hours as needed for severe pain. Must last 30 days.    Dispense:  90 tablet    Refill:  0    Not a duplicate. Do NOT delete! Dispense 1 day early if closed on refill date. Avoid benzodiazepines within 8 hours of opioids. Do not send refill requests.   oxyCODONE (OXY IR/ROXICODONE) 5 MG immediate release tablet    Sig: Take 1 tablet (5  mg total) by mouth every 8 (eight) hours as needed for severe pain. Must last 30 days.    Dispense:  90 tablet    Refill:  0    Not a duplicate. Do NOT delete! Dispense 1 day early if closed on refill date. Avoid benzodiazepines within 8 hours of opioids. Do not send refill requests.   diazepam (VALIUM) 10 MG tablet    Sig: Take 1 tablet (10 mg total) by mouth 60 (sixty) minutes before procedure for 1 dose. Take with a sip of water, on an empty stomach. Do not eat anything for 6 hours prior to procedure.    Dispense:  1 tablet    Refill:  0    Must have a driver. Do not drive or operate machinery x 24 hours after taking this medication. Avoid taking within 4 hours of having taken an opioid pain medications.    Orders:  Orders Placed This Encounter  Procedures   LUMBAR FACET(MEDIAL BRANCH NERVE BLOCK) MBNB    Standing Status:   Future    Standing Expiration Date:   08/04/2021    Scheduling Instructions:     Procedure: Lumbar facet block (AKA.: Lumbosacral medial branch nerve block)     Side: Bilateral     Level: L3-4, L4-5, & L5-S1 Facets (L2, L3, L4, L5, & S1 Medial Branch Nerves)     Sedation: Patient's choice.     Timeframe: ASAA    Order Specific Question:   Where will this procedure be performed?    Answer:   [facility]   Amb Ref to Medical Weight Management    Referral Priority:   Routine    Referral Type:   Consultation    Referral Reason:    Specialty Services Required    Number of Visits Requested:   1   Informed Consent Details: Physician/Practitioner Attestation; Transcribe to consent form and obtain patient signature    Nursing Order: Transcribe to consent form and obtain patient signature. Note: Always confirm laterality of pain with Ms. Mortell, before procedure.    Order Specific Question:   Physician/Practitioner attestation of informed consent for procedure/surgical case    Answer:   I, the physician/practitioner, attest that I have discussed with the patient the benefits, risks, side effects, alternatives, likelihood of achieving goals and potential problems during recovery for the procedure that I have provided informed consent.    Order Specific Question:   Procedure    Answer:   Lumbar Facet Block  under fluoroscopic guidance    Order Specific Question:   Physician/Practitioner performing the procedure    Answer:   Thayden Lemire A. Dossie Arbour MD    Order Specific Question:   Indication/Reason    Answer:   Low Back Pain, with our without leg pain, due to Facet Joint Arthralgia (Joint Pain) Spondylosis (Arthritis of the Spine), without myelopathy or radiculopathy (Nerve Damage).    Follow-up plan:   Return for (Clinic) procedure: , (NS) (B) L-FCT BLK (Diazepam given).     Interventional Therapies  Risk  Complexity Considerations:   Estimated body mass index is 38.73 kg/m as calculated from the following:   Height as of 04/20/21: 5' 1"  (1.549 m).   Weight as of 04/20/21: 205 lb (93 kg). WNL   Planned  Pending:   Pending further evaluation   Under consideration:   NOTE: NO RFA until BMI is <30 Continue encouraging the patient to bring her BMI below 35. Diagnostic bilateral T11-12 lumbar facet block   Diagnostic  right T7-8 TESI   Diagnostic right T7 TFESI   Diagnostic ML CESI   Possible candidate for intrathecal pump trial and implant    Completed:   Palliative bilateral lumbar facet block x4 (04/20/2021)   Therapeutic left lumbar facet RFA x3 (05/24/2020)  Therapeutic right lumbar facet RFA x3 (04/07/2020)    Therapeutic  Palliative (PRN) options:   Palliative bilateral lumbar facet block #5  Palliative left lumbar facet RFA #4 (05/24/2020)  Palliative right lumbar facet RFA #4 (04/07/2020)      Recent Visits Date Type Provider Dept  05/02/21 Telemedicine Milinda Pointer, MD [facility]  04/20/21 Procedure visit Milinda Pointer, MD [facility]  04/05/21 Office Visit Milinda Pointer, MD [facility]  Showing recent visits within past 90 days and meeting all other requirements Today's Visits Date Type Provider Dept  07/04/21 Office Visit Milinda Pointer, MD [facility]  Showing today's visits and meeting all other requirements Future Appointments No visits were found meeting these conditions. Showing future appointments within next 90 days and meeting all other requirements I discussed the assessment and treatment plan with the patient. The patient was provided an opportunity to ask questions and all were answered. The patient agreed with the plan and demonstrated an understanding of the instructions.  Patient advised to call back or seek an in-person evaluation if the symptoms or condition worsens.  Duration of encounter: 30 minutes.  Note by: [name], MD Date: 07/04/2021; Time: 3:32 PM

## 2021-07-04 ENCOUNTER — Ambulatory Visit: Payer: Medicaid Other | Attending: Pain Medicine | Admitting: Pain Medicine

## 2021-07-04 ENCOUNTER — Encounter: Payer: Self-pay | Admitting: Pain Medicine

## 2021-07-04 ENCOUNTER — Other Ambulatory Visit: Payer: Self-pay

## 2021-07-04 VITALS — BP 125/65 | HR 87 | Temp 97.1°F | Resp 16 | Ht 61.0 in | Wt 205.0 lb

## 2021-07-04 DIAGNOSIS — M5441 Lumbago with sciatica, right side: Secondary | ICD-10-CM | POA: Diagnosis present

## 2021-07-04 DIAGNOSIS — Z6841 Body Mass Index (BMI) 40.0 and over, adult: Secondary | ICD-10-CM | POA: Insufficient documentation

## 2021-07-04 DIAGNOSIS — F1190 Opioid use, unspecified, uncomplicated: Secondary | ICD-10-CM | POA: Insufficient documentation

## 2021-07-04 DIAGNOSIS — M5137 Other intervertebral disc degeneration, lumbosacral region: Secondary | ICD-10-CM | POA: Diagnosis present

## 2021-07-04 DIAGNOSIS — M79605 Pain in left leg: Secondary | ICD-10-CM | POA: Diagnosis present

## 2021-07-04 DIAGNOSIS — G894 Chronic pain syndrome: Secondary | ICD-10-CM | POA: Diagnosis present

## 2021-07-04 DIAGNOSIS — M47816 Spondylosis without myelopathy or radiculopathy, lumbar region: Secondary | ICD-10-CM | POA: Diagnosis present

## 2021-07-04 DIAGNOSIS — M5442 Lumbago with sciatica, left side: Secondary | ICD-10-CM | POA: Diagnosis present

## 2021-07-04 DIAGNOSIS — F419 Anxiety disorder, unspecified: Secondary | ICD-10-CM | POA: Insufficient documentation

## 2021-07-04 DIAGNOSIS — Z79891 Long term (current) use of opiate analgesic: Secondary | ICD-10-CM | POA: Insufficient documentation

## 2021-07-04 DIAGNOSIS — E1142 Type 2 diabetes mellitus with diabetic polyneuropathy: Secondary | ICD-10-CM | POA: Diagnosis present

## 2021-07-04 DIAGNOSIS — G8929 Other chronic pain: Secondary | ICD-10-CM | POA: Insufficient documentation

## 2021-07-04 DIAGNOSIS — Z79899 Other long term (current) drug therapy: Secondary | ICD-10-CM | POA: Insufficient documentation

## 2021-07-04 DIAGNOSIS — M79604 Pain in right leg: Secondary | ICD-10-CM | POA: Insufficient documentation

## 2021-07-04 MED ORDER — DIAZEPAM 10 MG PO TABS: 10.0000 mg | ORAL_TABLET | ORAL | 0 refills | Status: DC

## 2021-07-04 MED ORDER — OXYCODONE HCL 5 MG PO TABS: 5.0000 mg | ORAL_TABLET | Freq: Three times a day (TID) | ORAL | 0 refills | Status: DC | PRN

## 2021-07-04 NOTE — Progress Notes (Signed)
Nursing Pain Medication Assessment:  Safety precautions to be maintained throughout the outpatient stay will include: orient to surroundings, keep bed in low position, maintain call bell within reach at all times, provide assistance with transfer out of bed and ambulation.  Medication Inspection Compliance: Pill count conducted under aseptic conditions, in front of the patient. Neither the pills nor the bottle was removed from the patient's sight at any time. Once count was completed pills were immediately returned to the patient in their original bottle.  Medication: Oxycodone IR Pill/Patch Count:  3 of 90 pills remain Pill/Patch Appearance: Markings consistent with prescribed medication Bottle Appearance: Standard pharmacy container. Clearly labeled. Filled Date: 7 / 30 / 2022 Last Medication intake:  Today Safety precautions to be maintained throughout the outpatient stay will include: orient to surroundings, keep bed in low position, maintain call bell within reach at all times, provide assistance with transfer out of bed and ambulation.

## 2021-07-04 NOTE — Patient Instructions (Signed)
______________________________________________________________________  Preparing for Procedure with Oral Anxiolytics  Definition: Anxiolytics - Medications that provide muscle relaxation and decrease anxiety.  Procedure appointments are limited to planned procedures: No Prescription Refills. No disability issues will be discussed. No medication changes will be discussed.  Instructions: Oral Intake: Do not eat or drink anything for at least 6 hours prior to your procedure. (Exception: Blood Pressure Medication. See below.)  Anxiolytic Medication: This medication is meant to relax you during your procedure. DO NOT DRIVE OR OPERATE DANGEROUS MACHINERY WHILE UNDER THE INFLUENCE OF THIS MEDICATION. Take the oral medication (i.e.: Valium) as prescribed on the day of your procedure with just a sip of water. Prescription will be sent to your pharmacy, prior to the day of your procedure. Topical anesthetic: Your physician might have recommended a topical anesthetic/analgesic to apply over the area where the procedure will be done. If so, apply to area 1 hour prior to procedure. For exact location of application, ask your healthcare provider.    Transportation: A driver is required. You may not drive yourself after the procedure. Blood Pressure Medicine: Do not forget to take your blood pressure medicine with a sip of water the morning of the procedure. If your Diastolic (lower reading) is above 100 mmHg, elective cases will be cancelled/rescheduled. Blood thinners: These will need to be stopped for procedures. Notify our staff if you are taking any blood thinners. Depending on which one you take, there will be specific instructions on how and when to stop it. Diabetics on insulin: Notify the staff so that you can be scheduled 1st case in the morning. If your diabetes requires high dose insulin, take only  of your normal insulin dose the morning of the procedure and notify the staff that you have done  so. Preventing infections: Shower with an antibacterial soap the morning of your procedure. Build-up your immune system: Take 1000 mg of Vitamin C with every meal (3 times a day) the day prior to your procedure. Antibiotics: Inform the staff if you have a condition or reason that requires you to take antibiotics before dental procedures. Pregnancy: If you are pregnant, call and cancel the procedure. Sickness: If you have a cold, fever, or any active infections, call and cancel the procedure. Arrival: You must be in the facility at least 30 minutes prior to your scheduled procedure. Children: Do not bring children with you. Dress appropriately: Bring dark clothing that you would not mind if they get stained. Valuables: Do not bring any jewelry or valuables.  Reasons to call and reschedule or cancel your procedure:  NOTE: Following these recommendations will minimize the risk of a serious complication. Surgeries: Avoid having procedures within 2 weeks of any surgery. (Avoid for 2 weeks before or after any surgery). Flu Shots: Avoid having procedures within 2 weeks of a flu shots. (Avoid for 2 weeks before or after immunizations). Barium: Avoid having a procedure within 7-10 days after having had a radiological study involving the use of radiological contrast. (Myelograms, Barium swallow or enema study). Heart attacks: Avoid any elective procedures or surgeries for the initial 6 months after a "Myocardial Infarction" (Heart Attack). Blood thinners: It is imperative that you stop these medications before procedures. Let us know if you if you take any blood thinner.  Infection: Avoid procedures during or within two weeks of an infection (including chest colds or gastrointestinal problems). Symptoms associated with infections include: Localized redness, fever, chills, night sweats or profuse sweating, burning sensation when voiding, cough, congestion,   stuffiness, runny nose, sore throat, diarrhea,  nausea, vomiting, cold or Flu symptoms, recent or current infections. It is especially important if the infection is over the area that we intend to treat. Heart and lung problems: Symptoms that may suggest an active cardiopulmonary problem include: cough, chest pain, breathing difficulties or shortness of breath, dizziness, ankle swelling, uncontrolled high or unusually low blood pressure, and/or palpitations. If you are experiencing any of these symptoms, cancel your procedure and contact your primary care physician for an evaluation.  Remember:  Regular Business hours are:  Monday to Thursday 8:00 AM to 4:00 PM  Provider's Schedule: Milinda Pointer, MD:  Procedure days: Tuesday and Thursday 7:30 AM to 4:00 PM  Gillis Santa, MD:  Procedure days: Monday and Wednesday 7:30 AM to 4:00 PM ______________________________________________________________________  ____________________________________________________________________________________________  General Risks and Possible Complications  Patient Responsibilities: It is important that you read this as it is part of your informed consent. It is our duty to inform you of the risks and possible complications associated with treatments offered to you. It is your responsibility as a patient to read this and to ask questions about anything that is not clear or that you believe was not covered in this document.  Patient's Rights: You have the right to refuse treatment. You also have the right to change your mind, even after initially having agreed to have the treatment done. However, under this last option, if you wait until the last second to change your mind, you may be charged for the materials used up to that point.  Introduction: Medicine is not an Chief Strategy Officer. Everything in Medicine, including the lack of treatment(s), carries the potential for danger, harm, or loss (which is by definition: Risk). In Medicine, a complication is a secondary  problem, condition, or disease that can aggravate an already existing one. All treatments carry the risk of possible complications. The fact that a side effects or complications occurs, does not imply that the treatment was conducted incorrectly. It must be clearly understood that these can happen even when everything is done following the highest safety standards.  No treatment: You can choose not to proceed with the proposed treatment alternative. The "PRO(s)" would include: avoiding the risk of complications associated with the therapy. The "CON(s)" would include: not getting any of the treatment benefits. These benefits fall under one of three categories: diagnostic; therapeutic; and/or palliative. Diagnostic benefits include: getting information which can ultimately lead to improvement of the disease or symptom(s). Therapeutic benefits are those associated with the successful treatment of the disease. Finally, palliative benefits are those related to the decrease of the primary symptoms, without necessarily curing the condition (example: decreasing the pain from a flare-up of a chronic condition, such as incurable terminal cancer).  General Risks and Complications: These are associated to most interventional treatments. They can occur alone, or in combination. They fall under one of the following six (6) categories: no benefit or worsening of symptoms; bleeding; infection; nerve damage; allergic reactions; and/or death. No benefits or worsening of symptoms: In Medicine there are no guarantees, only probabilities. No healthcare provider can ever guarantee that a medical treatment will work, they can only state the probability that it may. Furthermore, there is always the possibility that the condition may worsen, either directly, or indirectly, as a consequence of the treatment. Bleeding: This is more common if the patient is taking a blood thinner, either prescription or over the counter (example: Goody  Powders, Fish oil, Aspirin, Garlic, etc.),  or if suffering a condition associated with impaired coagulation (example: Hemophilia, cirrhosis of the liver, low platelet counts, etc.). However, even if you do not have one on these, it can still happen. If you have any of these conditions, or take one of these drugs, make sure to notify your treating physician. Infection: This is more common in patients with a compromised immune system, either due to disease (example: diabetes, cancer, human immunodeficiency virus [HIV], etc.), or due to medications or treatments (example: therapies used to treat cancer and rheumatological diseases). However, even if you do not have one on these, it can still happen. If you have any of these conditions, or take one of these drugs, make sure to notify your treating physician. Nerve Damage: This is more common when the treatment is an invasive one, but it can also happen with the use of medications, such as those used in the treatment of cancer. The damage can occur to small secondary nerves, or to large primary ones, such as those in the spinal cord and brain. This damage may be temporary or permanent and it may lead to impairments that can range from temporary numbness to permanent paralysis and/or brain death. Allergic Reactions: Any time a substance or material comes in contact with our body, there is the possibility of an allergic reaction. These can range from a mild skin rash (contact dermatitis) to a severe systemic reaction (anaphylactic reaction), which can result in death. Death: In general, any medical intervention can result in death, most of the time due to an unforeseen complication. ____________________________________________________________________________________________ ____________________________________________________________________________________________  Medication Rules  Purpose: To inform patients, and their family members, of our rules and  regulations.  Applies to: All patients receiving prescriptions (written or electronic).  Pharmacy of record: Pharmacy where electronic prescriptions will be sent. If written prescriptions are taken to a different pharmacy, please inform the nursing staff. The pharmacy listed in the electronic medical record should be the one where you would like electronic prescriptions to be sent.  Electronic prescriptions: In compliance with the [place] (STOP) Act of 2017 (Session Lanny Cramp [phone]), effective November 05, 2018, all controlled substances must be electronically prescribed. Calling prescriptions to the pharmacy will cease to exist.  Prescription refills: Only during scheduled appointments. Applies to all prescriptions.  NOTE: The following applies primarily to controlled substances (Opioid* Pain Medications).   Type of encounter (visit): For patients receiving controlled substances, face-to-face visits are required. (Not an option or up to the patient.)  Patient's responsibilities: Pain Pills: Bring all pain pills to every appointment (except for procedure appointments). Pill Bottles: Bring pills in original pharmacy bottle. Always bring the newest bottle. Bring bottle, even if empty. Medication refills: You are responsible for knowing and keeping track of what medications you take and those you need refilled. The day before your appointment: write a list of all prescriptions that need to be refilled. The day of the appointment: give the list to the admitting nurse. Prescriptions will be written only during appointments. No prescriptions will be written on procedure days. If you forget a medication: it will not be "Called in", "Faxed", or "electronically sent". You will need to get another appointment to get these prescribed. No early refills. Do not call asking to have your prescription filled early. Prescription Accuracy: You are responsible for  carefully inspecting your prescriptions before leaving our office. Have the discharge nurse carefully go over each prescription with you, before taking them home. Make sure that your  name is accurately spelled, that your address is correct. Check the name and dose of your medication to make sure it is accurate. Check the number of pills, and the written instructions to make sure they are clear and accurate. Make sure that you are given enough medication to last until your next medication refill appointment. Taking Medication: Take medication as prescribed. When it comes to controlled substances, taking less pills or less frequently than prescribed is permitted and encouraged. Never take more pills than instructed. Never take medication more frequently than prescribed.  Inform other Doctors: Always inform, all of your healthcare providers, of all the medications you take. Pain Medication from other Providers: You are not allowed to accept any additional pain medication from any other Doctor or Healthcare provider. There are two exceptions to this rule. (see below) In the event that you require additional pain medication, you are responsible for notifying us, as stated below. Cough Medicine: Often these contain an opioid, such as codeine or hydrocodone. Never accept or take cough medicine containing these opioids if you are already taking an opioid* medication. The combination may cause respiratory failure and death. Medication Agreement: You are responsible for carefully reading and following our Medication Agreement. This must be signed before receiving any prescriptions from our practice. Safely store a copy of your signed Agreement. Violations to the Agreement will result in no further prescriptions. (Additional copies of our Medication Agreement are available upon request.) Laws, Rules, & Regulations: All patients are expected to follow all Federal and [employer], [employer], Rules, [employer]. Ignorance  of the Laws does not constitute a valid excuse.  Illegal drugs and Controlled Substances: The use of illegal substances (including, but not limited to marijuana and its derivatives) and/or the illegal use of any controlled substances is strictly prohibited. Violation of this rule may result in the immediate and permanent discontinuation of any and all prescriptions being written by our practice. The use of any illegal substances is prohibited. Adopted CDC guidelines & recommendations: Target dosing levels will be at or below 60 MME/day. Use of benzodiazepines** is not recommended.  Exceptions: There are only two exceptions to the rule of not receiving pain medications from other Healthcare Providers. Exception #1 (Emergencies): In the event of an emergency (i.e.: accident requiring emergency care), you are allowed to receive additional pain medication. However, you are responsible for: As soon as you are able, call our office [phone], at any time of the day or night, and leave a message stating your name, the date and nature of the emergency, and the name and dose of the medication prescribed. In the event that your call is answered by a member of our staff, make sure to document and save the date, time, and the name of the person that took your information.  Exception #2 (Planned Surgery): In the event that you are scheduled by another doctor or dentist to have any type of surgery or procedure, you are allowed (for a period no longer than 30 days), to receive additional pain medication, for the acute post-op pain. However, in this case, you are responsible for picking up a copy of our "Post-op Pain Management for Surgeons" handout, and giving it to your surgeon or dentist. This document is available at our office, and does not require an appointment to obtain it. Simply go to our office during business hours (Monday-Thursday from 8:00 AM to 4:00 PM) (Friday 8:00 AM to 12:00 Noon) or if you have a  scheduled appointment  with [place], prior to your surgery, and ask for it by name. In addition, you are responsible for: calling our office [phone], at any time of the day or night, and leaving a message stating your name, name of your surgeon, type of surgery, and date of procedure or surgery. Failure to comply with your responsibilities may result in termination of therapy involving the controlled substances.  *Opioid medications include: morphine, codeine, oxycodone, oxymorphone, hydrocodone, hydromorphone, meperidine, tramadol, tapentadol, buprenorphine, fentanyl, methadone. **Benzodiazepine medications include: diazepam (Valium), alprazolam (Xanax), clonazepam (Klonopine), lorazepam (Ativan), clorazepate (Tranxene), chlordiazepoxide (Librium), estazolam (Prosom), oxazepam (Serax), temazepam (Restoril), triazolam (Halcion) (Last updated: 10/03/2020) ____________________________________________________________________________________________  ____________________________________________________________________________________________  Medication Recommendations and Reminders  Applies to: All patients receiving prescriptions (written and/or electronic).  Medication Rules & Regulations: These rules and regulations exist for your safety and that of others. They are not flexible and neither are we. Dismissing or ignoring them will be considered "non-compliance" with medication therapy, resulting in complete and irreversible termination of such therapy. (See document titled "Medication Rules" for more details.) In all conscience, because of safety reasons, we cannot continue providing a therapy where the patient does not follow instructions.  Pharmacy of record:  Definition: This is the pharmacy where your electronic prescriptions will be sent.  We do not endorse any particular pharmacy, however, we have experienced problems with [facility] not securing enough medication supply for the community. We  do not restrict you in your choice of pharmacy. However, once we write for your prescriptions, we will NOT be re-sending more prescriptions to fix restricted supply problems created by your pharmacy, or your insurance.  The pharmacy listed in the electronic medical record should be the one where you want electronic prescriptions to be sent. If you choose to change pharmacy, simply notify our nursing staff.  Recommendations: Keep all of your pain medications in a safe place, under lock and key, even if you live alone. We will NOT replace lost, stolen, or damaged medication. After you fill your prescription, take 1 week's worth of pills and put them away in a safe place. You should keep a separate, properly labeled bottle for this purpose. The remainder should be kept in the original bottle. Use this as your primary supply, until it runs out. Once it's gone, then you know that you have 1 week's worth of medicine, and it is time to come in for a prescription refill. If you do this correctly, it is unlikely that you will ever run out of medicine. To make sure that the above recommendation works, it is very important that you make sure your medication refill appointments are scheduled at least 1 week before you run out of medicine. To do this in an effective manner, make sure that you do not leave the office without scheduling your next medication management appointment. Always ask the nursing staff to show you in your prescription , when your medication will be running out. Then arrange for the receptionist to get you a return appointment, at least 7 days before you run out of medicine. Do not wait until you have 1 or 2 pills left, to come in. This is very poor planning and does not take into consideration that we may need to cancel appointments due to bad weather, sickness, or emergencies affecting our staff. DO NOT ACCEPT A "Partial Fill": If for any reason your pharmacy does not have enough pills/tablets to  completely fill or refill your prescription, do not allow for a "partial fill". The law  allows the pharmacy to complete that prescription within 72 hours, without requiring a new prescription. If they do not fill the rest of your prescription within those 72 hours, you will need a separate prescription to fill the remaining amount, which we will NOT provide. If the reason for the partial fill is your insurance, you will need to talk to the pharmacist about payment alternatives for the remaining tablets, but again, DO NOT ACCEPT A PARTIAL FILL, unless you can trust your pharmacist to obtain the remainder of the pills within 72 hours.  Prescription refills and/or changes in medication(s):  Prescription refills, and/or changes in dose or medication, will be conducted only during scheduled medication management appointments. (Applies to both, written and electronic prescriptions.) No refills on procedure days. No medication will be changed or started on procedure days. No changes, adjustments, and/or refills will be conducted on a procedure day. Doing so will interfere with the diagnostic portion of the procedure. No phone refills. No medications will be "called into the pharmacy". No Fax refills. No weekend refills. No Holliday refills. No after hours refills.  Remember:  Business hours are:  Monday to Thursday 8:00 AM to 4:00 PM Provider's Schedule: Milinda Pointer, MD - Appointments are:  Medication management: Monday and Wednesday 8:00 AM to 4:00 PM Procedure day: Tuesday and Thursday 7:30 AM to 4:00 PM Gillis Santa, MD - Appointments are:  Medication management: Tuesday and Thursday 8:00 AM to 4:00 PM Procedure day: Monday and Wednesday 7:30 AM to 4:00 PM (Last update: 05/25/2020) ____________________________________________________________________________________________  ____________________________________________________________________________________________  CBD (cannabidiol)  WARNING  Applicable to: All individuals currently taking or considering taking CBD (cannabidiol) and, more important, all patients taking opioid analgesic controlled substances (pain medication). (Example: oxycodone; oxymorphone; hydrocodone; hydromorphone; morphine; methadone; tramadol; tapentadol; fentanyl; buprenorphine; butorphanol; dextromethorphan; meperidine; codeine; etc.)  Legal status: CBD remains a Schedule I drug prohibited for any use. CBD is illegal with one exception. In the [place], CBD has a limited Transport planner (FDA) approval for the treatment of two specific types of epilepsy disorders. Only one CBD product has been approved by the FDA for this purpose: "Epidiolex". FDA is aware that some companies are marketing products containing cannabis and cannabis-derived compounds in ways that violate the [employer], Drug and Cosmetic Act [facility] Act) and that may put the health and safety of consumers at risk. The FDA, a Federal agency, has not enforced the CBD status since 2018.   Legality: Some manufacturers ship CBD products nationally, which is illegal. Often such products are sold online and are therefore available throughout the country. CBD is openly sold in head shops and health food stores in some states where such sales have not been explicitly legalized. Selling unapproved products with unsubstantiated therapeutic claims is not only a violation of the law, but also can put patients at risk, as these products have not been proven to be safe or effective. Federal illegality makes it difficult to conduct research on CBD.  Reference: "FDA Regulation of Cannabis and Cannabis-Derived Products, Including Cannabidiol (CBD)" - [employer]  Warning: CBD is not FDA approved and has not undergo the same manufacturing controls as prescription drugs.  This  means that the purity and safety of available CBD may be questionable. Most of the time, despite manufacturer's claims, it is contaminated with THC (delta-9-tetrahydrocannabinol - the chemical in marijuana responsible for the "HIGH").  When this is the case, the [facility] contaminant will trigger a positive urine drug screen (UDS) test for Marijuana (carboxy-THC).  Because a positive UDS for any illicit substance is a violation of our medication agreement, your opioid analgesics (pain medicine) may be permanently discontinued.  MORE ABOUT CBD  General Information: CBD  is a derivative of the Marijuana (cannabis sativa) plant discovered in 24. It is one of the 113 identified substances found in Marijuana. It accounts for up to 40% of the plant's extract. As of 2018, preliminary clinical studies on CBD included research for the treatment of anxiety, movement disorders, and pain. CBD is available and consumed in multiple forms, including inhalation of smoke or vapor, as an aerosol spray, and by mouth. It may be supplied as an oil containing CBD, capsules, dried cannabis, or as a liquid solution. CBD is thought not to be as psychoactive as THC (delta-9-tetrahydrocannabinol - the chemical in marijuana responsible for the "HIGH"). Studies suggest that CBD may interact with different biological target receptors in the body, including cannabinoid and other neurotransmitter receptors. As of 2018 the mechanism of action for its biological effects has not been determined.  Side-effects  Adverse reactions: Dry mouth, diarrhea, decreased appetite, fatigue, drowsiness, malaise, weakness, sleep disturbances, and others.  Drug interactions: CBC may interact with other medications such as blood-thinners. (Last update: 06/11/2020) ____________________________________________________________________________________________  ____________________________________________________________________________________________  Drug  Holidays (Slow)  What is a "Drug Holiday"? Drug Holiday: is the name given to the period of time during which a patient stops taking a medication(s) for the purpose of eliminating tolerance to the drug.  Benefits Improved effectiveness of opioids. Decreased opioid dose needed to achieve benefits. Improved pain with lesser dose.  What is tolerance? Tolerance: is the progressive decreased in effectiveness of a drug due to its repetitive use. With repetitive use, the body gets use to the medication and as a consequence, it loses its effectiveness. This is a common problem seen with opioid pain medications. As a result, a larger dose of the drug is needed to achieve the same effect that used to be obtained with a smaller dose.  How long should a "Drug Holiday" last? You should stay off of the pain medicine for at least 14 consecutive days. (2 weeks)  Should I stop the medicine "cold [place]"? No. You should always coordinate with your Pain Specialist so that he/she can provide you with the correct medication dose to make the transition as smoothly as possible.  How do I stop the medicine? Slowly. You will be instructed to decrease the daily amount of pills that you take by one (1) pill every seven (7) days. This is called a "slow downward taper" of your dose. For example: if you normally take four (4) pills per day, you will be asked to drop this dose to three (3) pills per day for seven (7) days, then to two (2) pills per day for seven (7) days, then to one (1) per day for seven (7) days, and at the end of those last seven (7) days, this is when the "Drug Holiday" would start.   Will I have withdrawals? By doing a "slow downward taper" like this one, it is unlikely that you will experience any significant withdrawal symptoms. Typically, what triggers withdrawals is the sudden stop of a high dose opioid therapy. Withdrawals can usually be avoided by slowly decreasing the dose over a prolonged  period of time. If you do not follow these instructions and decide to stop your medication abruptly, withdrawals may be possible.  What are withdrawals? Withdrawals: refers to the wide range of symptoms that occur  after stopping or dramatically reducing opiate drugs after heavy and prolonged use. Withdrawal symptoms do not occur to patients that use low dose opioids, or those who take the medication sporadically. Contrary to benzodiazepine (example: Valium, Xanax, etc.) or alcohol withdrawals ("Delirium Tremens"), opioid withdrawals are not lethal. Withdrawals are the physical manifestation of the body getting rid of the excess receptors.  Expected Symptoms Early symptoms of withdrawal may include: Agitation Anxiety Muscle aches Increased tearing Insomnia Runny nose Sweating Yawning  Late symptoms of withdrawal may include: Abdominal cramping Diarrhea Dilated pupils Goose bumps Nausea Vomiting  Will I experience withdrawals? Due to the slow nature of the taper, it is very unlikely that you will experience any.  What is a slow taper? Taper: refers to the gradual decrease in dose.  (Last update: 05/25/2020) ____________________________________________________________________________________________

## 2021-07-04 NOTE — Progress Notes (Signed)
Informed consent for Lumbar facet under fluoroscopy and PO valium signed today. Patient aware to pick up valium '10mg'$  at her pharmacy and to take valium 1 hour prior to procedure. Patient also aware to have someone drive to to the clinic on procedure day. Copy made of consent and given to patient.   List of weight loss clinics in the area given to patient.   All question answered.   Janne Napoleon, RN

## 2021-07-18 ENCOUNTER — Ambulatory Visit: Payer: Medicaid Other | Admitting: Pain Medicine

## 2021-07-22 NOTE — Progress Notes (Deleted)
PROVIDER NOTE: Information contained herein reflects review and annotations entered in association with encounter. Interpretation of such information and data should be left to medically-trained personnel. Information provided to patient can be located elsewhere in the medical record under "Patient Instructions". Document created using STT-dictation technology, any transcriptional errors that may result from process are unintentional.    Patient: Shannon Benitez  Service Category: Procedure  Provider: [name], MD  DOB: 12/29/1970  DOS: 07/25/2021  Location: [facility]  MRN: [id number]  Setting: Ambulatory - outpatient  Referring Provider: Milinda Pointer, MD  Type: Established Patient  Specialty: Interventional Pain Management  PCP: [facility] [place] [facility]   Primary Reason for Visit: Interventional Pain Management Treatment. CC: No chief complaint on file.    Procedure:          Anesthesia, Analgesia, Anxiolysis:  Type: Lumbar Facet, Medial Branch Block(s)          Primary Purpose: Diagnostic Region: Posterolateral Lumbosacral Spine Level: L2, L3, L4, L5, & S1 Medial Branch Level(s). Injecting these levels blocks the L3-4, L4-5, and L5-S1 lumbar facet joints. Laterality:  ***   Type: Local Anesthesia Local Anesthetic: Lidocaine 1-2% Sedation: Minimal Anxiolysis  Indication(s): Anxiety & Analgesia Route: Infiltration ([place]/IM) IV Access: Available   Position: Prone   Indications: No diagnosis found. Pain Score: Pre-procedure:  /10 Post-procedure:  /10     Pre-op H&P Assessment:  Shannon Benitez is a 50 y.o. (year old), female patient, seen today for interventional treatment. She  has a past surgical history that includes Abdominal hysterectomy; Cesarean section; Tumor removal (Left); Cataract extraction w/PHACO (Right, 03/18/2019); and Cataract extraction w/PHACO (Left, 04/13/2019). Shannon Benitez has a current medication list which includes the following  prescription(s): aspirin-acetaminophen-caffeine, baclofen, diazepam, dimethyl fumarate, duloxetine, lamotrigine, levetiracetam, loperamide, meloxicam, metformin, mirabegron er, omeprazole, oxycodone, [START ON 08/03/2021] oxycodone, [START ON 09/02/2021] oxycodone, pregabalin, primidone, propranolol, and sitagliptin. Her primarily concern today is the No chief complaint on file.  Initial Vital Signs:  Pulse/HCG Rate:    Temp:   Resp:   BP:   SpO2:    BMI: Estimated body mass index is 38.73 kg/m as calculated from the following:   Height as of 07/04/21: '5\' 1"'$  (1.549 m).   Weight as of 07/04/21: 205 lb (93 kg).  Risk Assessment: Allergies: Reviewed. She is allergic to 5-alpha reductase inhibitors, desvenlafaxine, glatiramer, rebif [interferon beta-1a], and latex.  Allergy Precautions: None required Coagulopathies: Reviewed. None identified.  Blood-thinner therapy: None at this time Active Infection(s): Reviewed. None identified. Shannon Benitez is afebrile  Site Confirmation: Ms. Biffle was asked to confirm the procedure and laterality before marking the site Procedure checklist: Completed Consent: Before the procedure and under the influence of no sedative(s), amnesic(s), or anxiolytics, the patient was informed of the treatment options, risks and possible complications. To fulfill our ethical and legal obligations, as recommended by the American Medical Association's Code of Ethics, I have informed the patient of my clinical impression; the nature and purpose of the treatment or procedure; the risks, benefits, and possible complications of the intervention; the alternatives, including doing nothing; the risk(s) and benefit(s) of the alternative treatment(s) or procedure(s); and the risk(s) and benefit(s) of doing nothing. The patient was provided information about the general risks and possible complications associated with the procedure. These may include, but are not limited to: failure  to achieve desired goals, infection, bleeding, organ or nerve damage, allergic reactions, paralysis, and death. In addition, the patient was informed of those risks and complications  associated to Spine-related procedures, such as failure to decrease pain; infection (i.e.: Meningitis, epidural or intraspinal abscess); bleeding (i.e.: epidural hematoma, subarachnoid hemorrhage, or any other type of intraspinal or peri-dural bleeding); organ or nerve damage (i.e.: Any type of peripheral nerve, nerve root, or spinal cord injury) with subsequent damage to sensory, motor, and/or autonomic systems, resulting in permanent pain, numbness, and/or weakness of one or several areas of the body; allergic reactions; (i.e.: anaphylactic reaction); and/or death. Furthermore, the patient was informed of those risks and complications associated with the medications. These include, but are not limited to: allergic reactions (i.e.: anaphylactic or anaphylactoid reaction(s)); adrenal axis suppression; blood sugar elevation that in diabetics may result in ketoacidosis or comma; water retention that in patients with history of congestive heart failure may result in shortness of breath, pulmonary edema, and decompensation with resultant heart failure; weight gain; swelling or edema; medication-induced neural toxicity; particulate matter embolism and blood vessel occlusion with resultant organ, and/or nervous system infarction; and/or aseptic necrosis of one or more joints. Finally, the patient was informed that Medicine is not an exact science; therefore, there is also the possibility of unforeseen or unpredictable risks and/or possible complications that may result in a catastrophic outcome. The patient indicated having understood very clearly. We have given the patient no guarantees and we have made no promises. Enough time was given to the patient to ask questions, all of which were answered to the patient's satisfaction. Ms.  Benitez has indicated that she wanted to continue with the procedure. Attestation: I, the ordering provider, attest that I have discussed with the patient the benefits, risks, side-effects, alternatives, likelihood of achieving goals, and potential problems during recovery for the procedure that I have provided informed consent. Date  Time: {CHL [facility]-PAIN TIME CHOICES:[id number]}  Pre-Procedure Preparation:  Monitoring: As per clinic protocol. Respiration, ETCO2, SpO2, BP, heart rate and rhythm monitor placed and checked for adequate function Safety Precautions: Patient was assessed for positional comfort and pressure points before starting the procedure. Time-out: I initiated and conducted the "Time-out" before starting the procedure, as per protocol. The patient was asked to participate by confirming the accuracy of the "Time Out" information. Verification of the correct person, site, and procedure were performed and confirmed by me, the nursing staff, and the patient. "Time-out" conducted as per Joint Commission's Universal Protocol (UP.01.01.01). Time:    Description of Procedure:          Laterality:  ***  Levels:  L2, L3, L4, L5, & S1 Medial Branch Level(s) Area Prepped: Posterior Lumbosacral Region DuraPrep (Iodine Povacrylex [0.7% available iodine] and Isopropyl Alcohol, 74% w/w) Safety Precautions: Aspiration looking for blood return was conducted prior to all injections. At no point did we inject any substances, as a needle was being advanced. Before injecting, the patient was told to immediately notify me if she was experiencing any new onset of "ringing in the ears, or metallic taste in the mouth". No attempts were made at seeking any paresthesias. Safe injection practices and needle disposal techniques used. Medications properly checked for expiration dates. SDV (single dose vial) medications used. After the completion of the procedure, all disposable equipment used was discarded in  the proper designated medical waste containers. Local Anesthesia: Protocol guidelines were followed. The patient was positioned over the fluoroscopy table. The area was prepped in the usual manner. The time-out was completed. The target area was identified using fluoroscopy. A 12-in long, straight, sterile hemostat was used with fluoroscopic guidance to locate the targets  for each level blocked. Once located, the skin was marked with an approved surgical skin marker. Once all sites were marked, the skin (epidermis, dermis, and hypodermis), as well as deeper tissues (fat, connective tissue and muscle) were infiltrated with a small amount of a short-acting local anesthetic, loaded on a 10cc syringe with a 25G, 1.5-in  Needle. An appropriate amount of time was allowed for local anesthetics to take effect before proceeding to the next step. Local Anesthetic: Lidocaine 2.0% The unused portion of the local anesthetic was discarded in the proper designated containers. Technical explanation of process:  L2 Medial Branch Nerve Block (MBB): The target area for the L2 medial branch is at the junction of the postero-lateral aspect of the superior articular process and the superior, posterior, and medial edge of the transverse process of L3. Under fluoroscopic guidance, a Quincke needle was inserted until contact was made with os over the superior postero-lateral aspect of the pedicular shadow (target area). After negative aspiration for blood, 0.5 mL of the nerve block solution was injected without difficulty or complication. The needle was removed intact. L3 Medial Branch Nerve Block (MBB): The target area for the L3 medial branch is at the junction of the postero-lateral aspect of the superior articular process and the superior, posterior, and medial edge of the transverse process of L4. Under fluoroscopic guidance, a Quincke needle was inserted until contact was made with os over the superior postero-lateral aspect of  the pedicular shadow (target area). After negative aspiration for blood, 0.5 mL of the nerve block solution was injected without difficulty or complication. The needle was removed intact. L4 Medial Branch Nerve Block (MBB): The target area for the L4 medial branch is at the junction of the postero-lateral aspect of the superior articular process and the superior, posterior, and medial edge of the transverse process of L5. Under fluoroscopic guidance, a Quincke needle was inserted until contact was made with os over the superior postero-lateral aspect of the pedicular shadow (target area). After negative aspiration for blood, 0.5 mL of the nerve block solution was injected without difficulty or complication. The needle was removed intact. L5 Medial Branch Nerve Block (MBB): The target area for the L5 medial branch is at the junction of the postero-lateral aspect of the superior articular process and the superior, posterior, and medial edge of the sacral ala. Under fluoroscopic guidance, a Quincke needle was inserted until contact was made with os over the superior postero-lateral aspect of the pedicular shadow (target area). After negative aspiration for blood, 0.5 mL of the nerve block solution was injected without difficulty or complication. The needle was removed intact. S1 Medial Branch Nerve Block (MBB): The target area for the S1 medial branch is at the posterior and inferior 6 o'clock position of the L5-S1 facet joint. Under fluoroscopic guidance, the Quincke needle inserted for the L5 MBB was redirected until contact was made with os over the inferior and postero aspect of the sacrum, at the 6 o' clock position under the L5-S1 facet joint (Target area). After negative aspiration for blood, 0.5 mL of the nerve block solution was injected without difficulty or complication. The needle was removed intact.  Nerve block solution: 0.2% PF-Ropivacaine + Triamcinolone (40 mg/mL) diluted to a final concentration  of 4 mg of Triamcinolone/mL of Ropivacaine The unused portion of the solution was discarded in the proper designated containers. Procedural Needles: 22-gauge, 3.5-inch, Quincke needles used for all levels.  Once the entire procedure was completed, the treated area  was cleaned, making sure to leave some of the prepping solution back to take advantage of its long term bactericidal properties.      Illustration of the posterior view of the lumbar spine and the posterior neural structures. Laminae of L2 through S1 are labeled. DPRL5, dorsal primary ramus of L5; DPRS1, dorsal primary ramus of S1; DPR3, dorsal primary ramus of L3; FJ, facet (zygapophyseal) joint L3-L4; I, inferior articular process of L4; LB1, lateral branch of dorsal primary ramus of L1; IAB, inferior articular branches from L3 medial branch (supplies L4-L5 facet joint); IBP, intermediate branch plexus; MB3, medial branch of dorsal primary ramus of L3; NR3, third lumbar nerve root; S, superior articular process of L5; SAB, superior articular branches from L4 (supplies L4-5 facet joint also); TP3, transverse process of L3.  There were no vitals filed for this visit.   Start Time:   hrs. End Time:   hrs.  Imaging Guidance (Spinal):          Type of Imaging Technique: Fluoroscopy Guidance (Spinal) Indication(s): Assistance in needle guidance and placement for procedures requiring needle placement in or near specific anatomical locations not easily accessible without such assistance. Exposure Time: Please see nurses notes. Contrast: None used. Fluoroscopic Guidance: I was personally present during the use of fluoroscopy. "Tunnel Vision Technique" used to obtain the best possible view of the target area. Parallax error corrected before commencing the procedure. "Direction-depth-direction" technique used to introduce the needle under continuous pulsed fluoroscopy. Once target was reached, antero-posterior, oblique, and lateral fluoroscopic  projection used confirm needle placement in all planes. Images permanently stored in EMR. Interpretation: No contrast injected. I personally interpreted the imaging intraoperatively. Adequate needle placement confirmed in multiple planes. Permanent images saved into the patient's record.  Antibiotic Prophylaxis:   Anti-infectives (From admission, onward)    None      Indication(s): None identified  Post-operative Assessment:  Post-procedure Vital Signs:  Pulse/HCG Rate:    Temp:   Resp:   BP:   SpO2:    EBL: None  Complications: No immediate post-treatment complications observed by team, or reported by patient.  Note: The patient tolerated the entire procedure well. A repeat set of vitals were taken after the procedure and the patient was kept under observation following institutional policy, for this type of procedure. Post-procedural neurological assessment was performed, showing return to baseline, prior to discharge. The patient was provided with post-procedure discharge instructions, including a section on how to identify potential problems. Should any problems arise concerning this procedure, the patient was given instructions to immediately contact us, at any time, without hesitation. In any case, we plan to contact the patient by telephone for a follow-up status report regarding this interventional procedure.  Comments:  No additional relevant information.  Plan of Care  Orders:  No orders of the defined types were placed in this encounter.  Chronic Opioid Analgesic:  Oxycodone IR 5 mg, 1 tab PO BID (10 mg/day of oxycodone)  MME/day: 15 mg/day.   Medications ordered for procedure: No orders of the defined types were placed in this encounter.  Medications administered: Jaylise Galvao had no medications administered during this visit.  See the medical record for exact dosing, route, and time of administration.  Follow-up plan:   No follow-ups on file.        Interventional Therapies  Risk  Complexity Considerations:   Estimated body mass index is 38.73 kg/m as calculated from the following:   Height as of 04/20/21: '5\' 1"'$  (  1.549 m).   Weight as of 04/20/21: 205 lb (93 kg). WNL   Planned  Pending:   Pending further evaluation   Under consideration:   NOTE: NO RFA until BMI is <30 Continue encouraging the patient to bring her BMI below 35. Diagnostic bilateral T11-12 lumbar facet block   Diagnostic right T7-8 TESI   Diagnostic right T7 TFESI   Diagnostic ML CESI   Possible candidate for intrathecal pump trial and implant    Completed:   Palliative bilateral lumbar facet block x4 (04/20/2021)  Therapeutic left lumbar facet RFA x3 (05/24/2020)  Therapeutic right lumbar facet RFA x3 (04/07/2020)    Therapeutic  Palliative (PRN) options:   Palliative bilateral lumbar facet block #5  Palliative left lumbar facet RFA #4 (05/24/2020)  Palliative right lumbar facet RFA #4 (04/07/2020)       Recent Visits Date Type Provider Dept  07/04/21 Office Visit Milinda Pointer, MD [facility]  05/02/21 Telemedicine Milinda Pointer, MD [facility]  Showing recent visits within past 90 days and meeting all other requirements Future Appointments Date Type Provider Dept  07/25/21 Appointment Milinda Pointer, MD [facility]  10/02/21 Appointment Milinda Pointer, MD [facility]  Showing future appointments within next 90 days and meeting all other requirements Disposition: Discharge home  Discharge (Date  Time): 07/25/2021;   hrs.   Primary Care Physician: [facility] [place] [facility] Location: [facility] Note by: [name], MD Date: 07/25/2021; Time: 11:31 AM  Disclaimer:  Medicine is not an exact science. The only guarantee in medicine is that nothing is guaranteed. It is important to note that the decision to proceed with this intervention was based on the information  collected from the patient. The Data and conclusions were drawn from the patient's questionnaire, the interview, and the physical examination. Because the information was provided in large part by the patient, it cannot be guaranteed that it has not been purposely or unconsciously manipulated. Every effort has been made to obtain as much relevant data as possible for this evaluation. It is important to note that the conclusions that lead to this procedure are derived in large part from the available data. Always take into account that the treatment will also be dependent on availability of resources and existing treatment guidelines, considered by other Pain Management Practitioners as being common knowledge and practice, at the time of the intervention. For Medico-Legal purposes, it is also important to point out that variation in procedural techniques and pharmacological choices are the acceptable norm. The indications, contraindications, technique, and results of the above procedure should only be interpreted and judged by a Board-Certified Interventional Pain Specialist with extensive familiarity and expertise in the same exact procedure and technique.

## 2021-07-25 ENCOUNTER — Ambulatory Visit: Payer: Medicaid Other | Admitting: Pain Medicine

## 2021-08-01 ENCOUNTER — Ambulatory Visit: Payer: Medicaid Other | Admitting: Pain Medicine

## 2021-08-14 NOTE — Progress Notes (Deleted)
No-show to procedure  

## 2021-08-15 ENCOUNTER — Ambulatory Visit: Payer: Medicaid Other | Admitting: Pain Medicine

## 2021-08-31 ENCOUNTER — Ambulatory Visit: Payer: Medicaid Other | Admitting: Pain Medicine

## 2021-09-08 IMAGING — MR MR LUMBAR SPINE W/O CM
5 series · 31 of 48 positions shown · non-contrast
Comparison: 07/02/2017

CLINICAL DATA: Osteoarthritis. Lumbosacral back pain. Pain radiates
to both legs and feet. Falling. History of multiple sclerosis.

EXAM:
MRI LUMBAR SPINE WITHOUT CONTRAST
TECHNIQUE: Multiplanar, multisequence MR imaging of the lumbar spine was
performed. No intravenous contrast was administered.

[Series 5: T2 · sagittal · 4.0mm · 0.81mm/px · 6 of 17 slices shown (1 of 2)]
[im 1/17]
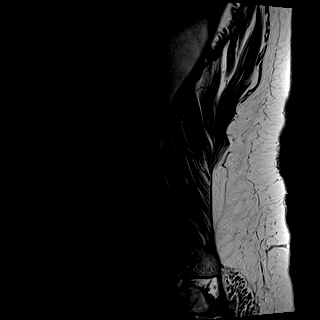
[im 4/17]
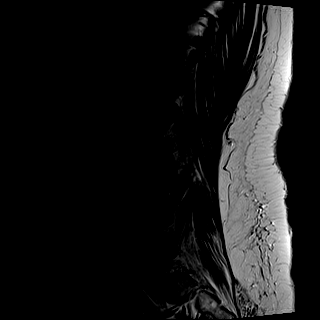
[im 7/17]
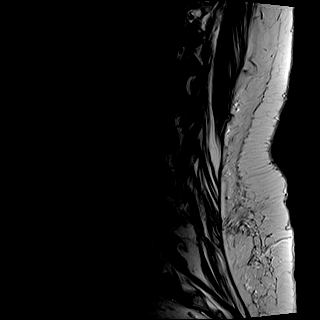
[im 10/17]
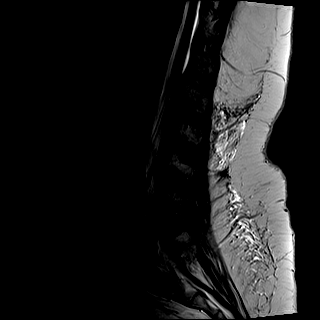
[im 13/17]
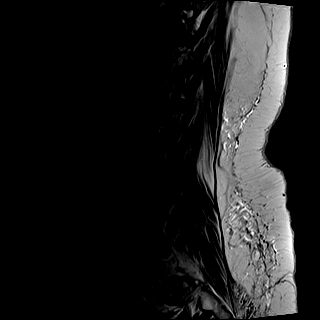
[im 17/17]
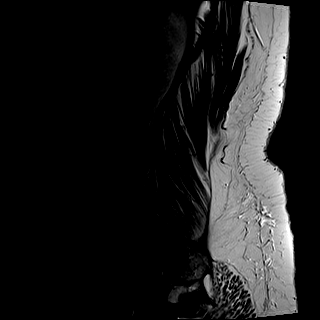

[Series 6: T1 · sagittal · 4.0mm · 0.81mm/px · 7 of 17 slices shown (1 of 2)]
[im 1/17]
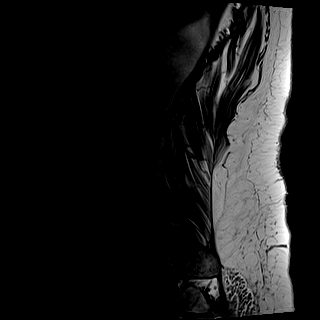
[im 3/17]
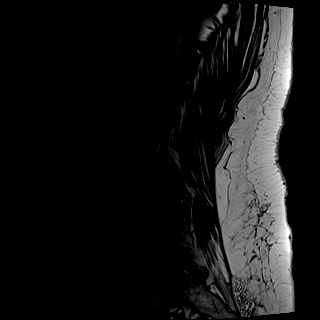
[im 6/17]
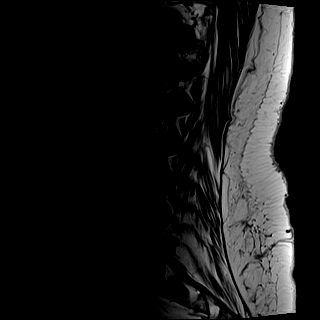
[im 9/17]
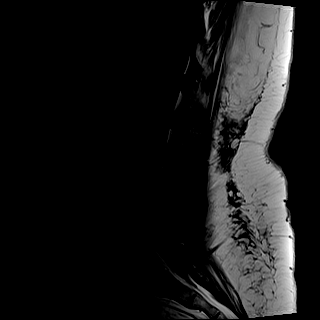
[im 11/17]
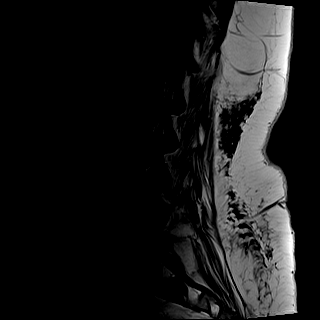
[im 14/17]
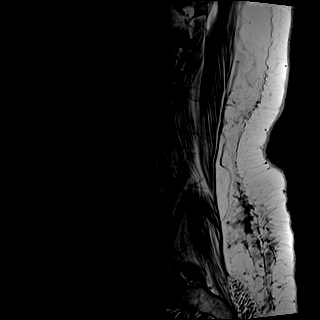
[im 17/17]
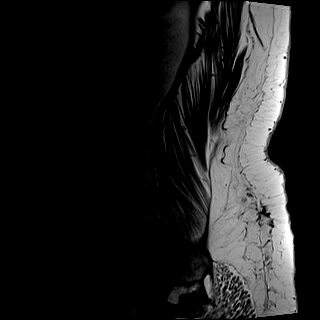

[Series 7: STIR · sagittal · 4.0mm · 0.41mm/px · 2 of 17 slices shown]
[im 1/17]
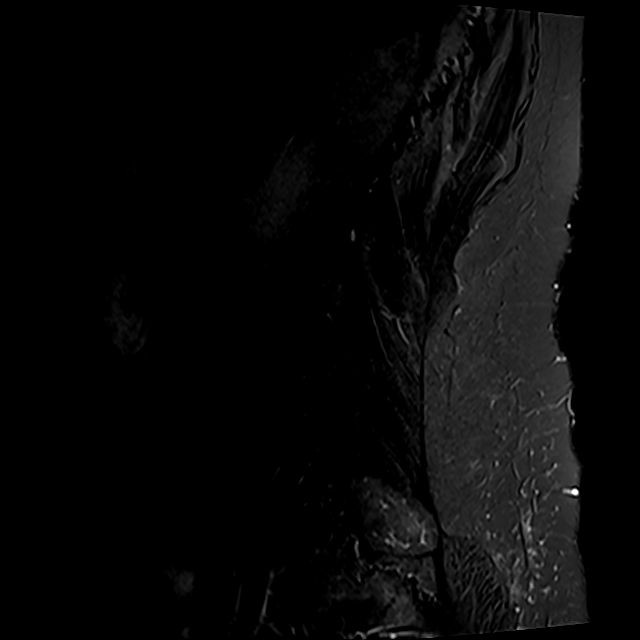
[im 3/17]
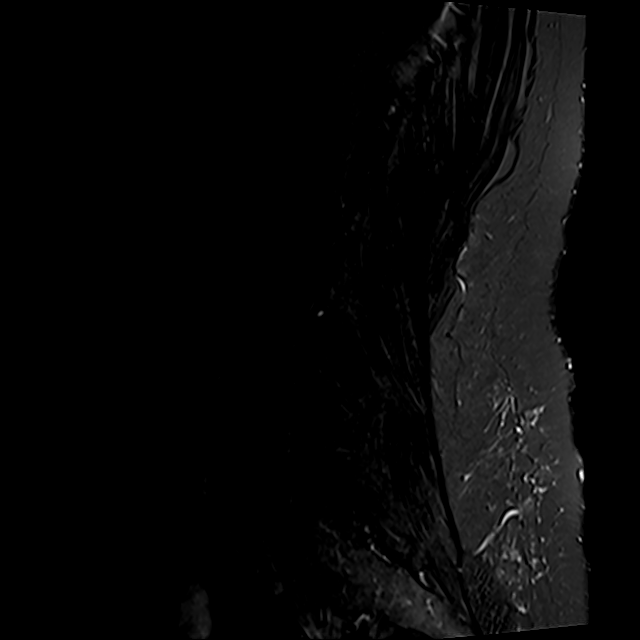

[Series 8: T2 · axial · 4.0mm · 0.78mm/px · z∈[-190,+23]mm · 8 of 37 slices shown (2 of 2)]
[im 1/37]
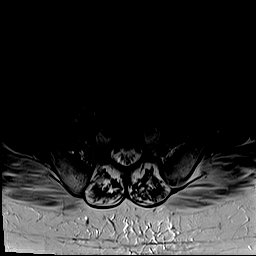
[im 6/37]
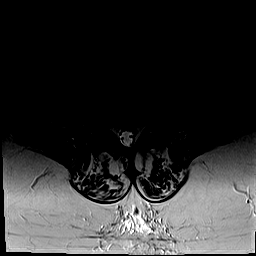
[im 12/37]
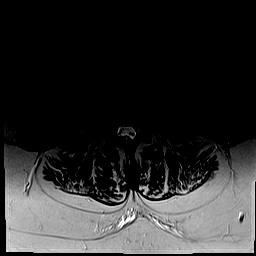
[im 17/37]
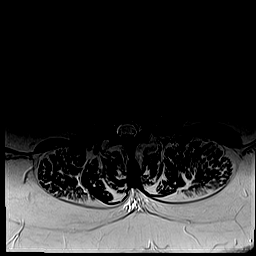
[im 20/37]
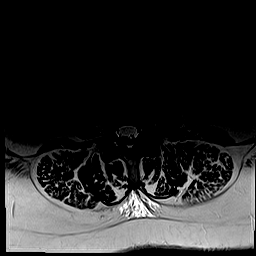
[im 25/37]
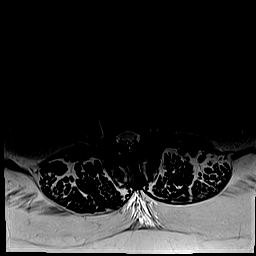
[im 31/37]
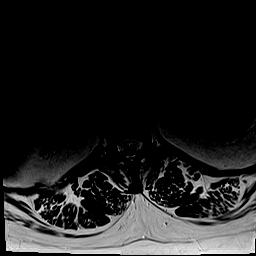
[im 37/37]
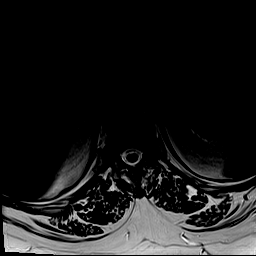

[Series 9: T1 · axial · 4.0mm · 0.39mm/px · z∈[-190,+23]mm · 8 of 37 slices shown (2 of 2)]
[im 1/37]
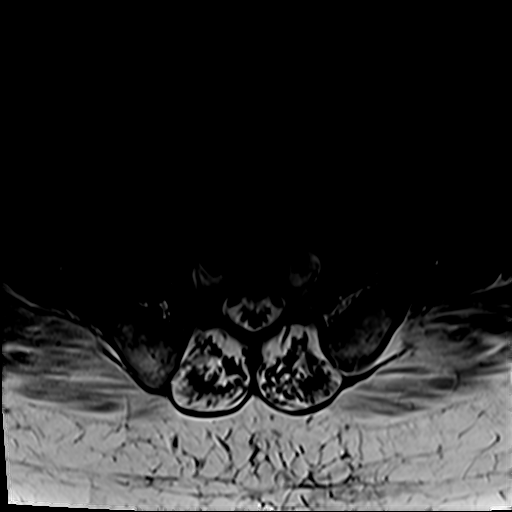
[im 6/37]
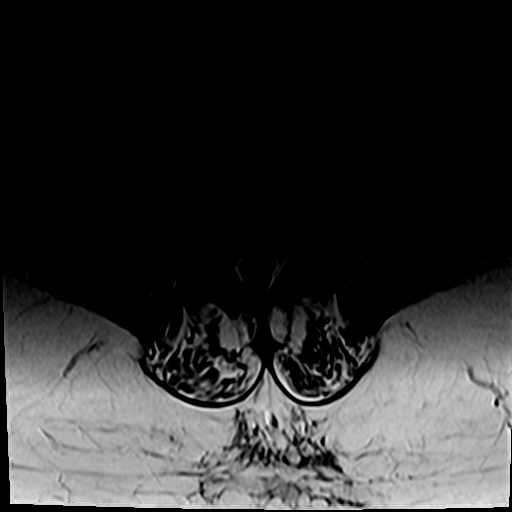
[im 12/37]
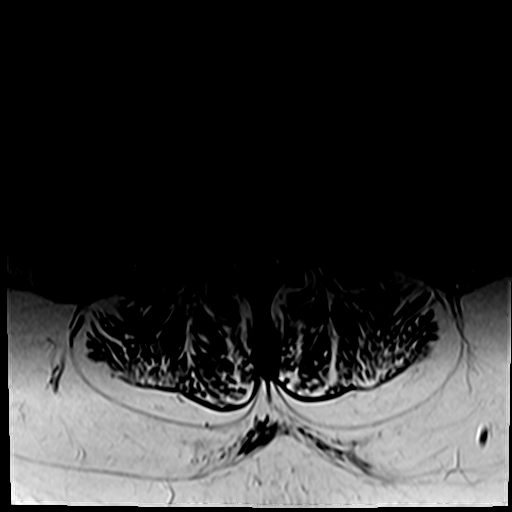
[im 17/37]
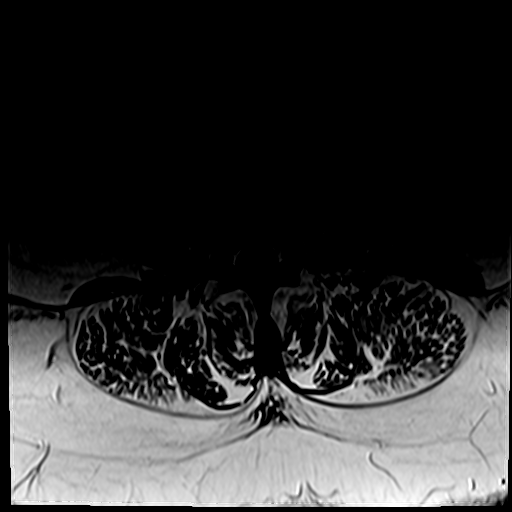
[im 20/37]
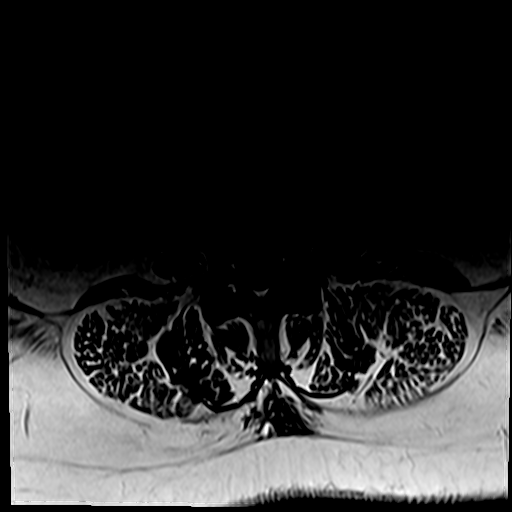
[im 25/37]
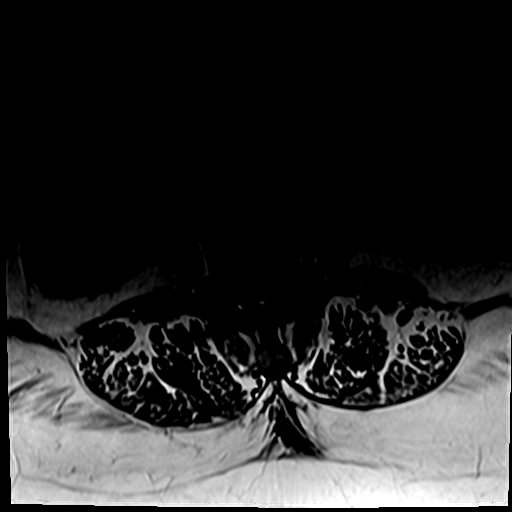
[im 31/37]
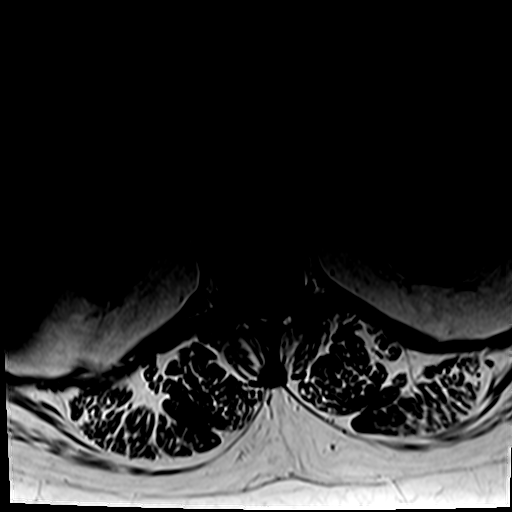
[im 37/37]
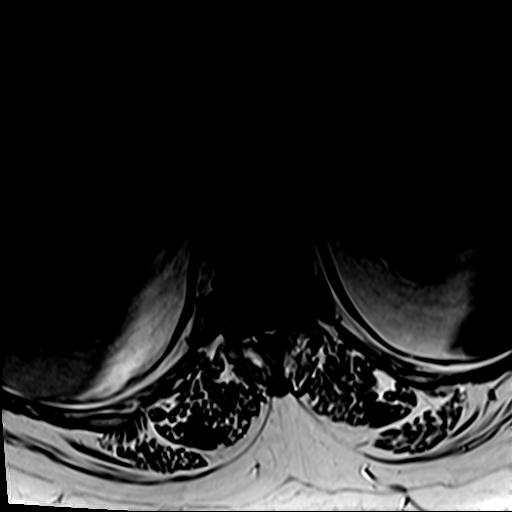

[31 of 48 positions shown; findings below may reference images not displayed]

FINDINGS: Segmentation:  5 lumbar type vertebral bodies.

Alignment: Minimal curvature convex to the right. No significant
malalignment.

Vertebrae:  No fracture or primary bone lesion.

Conus medullaris and cauda equina: Conus extends to the L2 level.
Conus and cauda equina appear normal.

Paraspinal and other soft tissues: Negative

Disc levels:

No abnormality from T12-L1 through L3-4. The discs are normal. No
stenosis of the canal or foramina.

L4-5: Minimal degenerative desiccation of the disc with minimal
annular bulging. No compressive narrowing of the canal or foramina.
No significant facet osteoarthritis.

L5-S1: Mild disc degeneration with minimal annular bulging but no
focal herniation. No stenosis or neural compression. No significant
facet osteoarthritis.
IMPRESSION: No advanced finding. Mild degenerative desiccation and annular
bulging at L4-5 and L5-S1 but no herniation or compressive narrowing
of the canal or foramina. No evidence of facet arthropathy.

## 2021-09-19 ENCOUNTER — Telehealth: Payer: Self-pay

## 2021-09-19 NOTE — Telephone Encounter (Signed)
Pt received a shot from her spine doctor and wants to know if she needs to come in Thursday for her inj with dr.n

## 2021-09-19 NOTE — Telephone Encounter (Signed)
Patient had SI injections on 09/06/2021 at the [facility] spine center. States she had no idea they were going to do any injections. I instucted her to come in for her appointment on Thursday and she can discuss this with Dr. Dossie Arbour then.

## 2021-09-21 ENCOUNTER — Ambulatory Visit: Payer: Medicaid Other | Admitting: Pain Medicine

## 2021-09-29 NOTE — Progress Notes (Signed)
PROVIDER NOTE: Information contained herein reflects review and annotations entered in association with encounter. Interpretation of such information and data should be left to medically-trained personnel. Information provided to patient can be located elsewhere in the medical record under "Patient Instructions". Document created using STT-dictation technology, any transcriptional errors that may result from process are unintentional.    Patient: Shannon Benitez  Service Category: E/M  Provider: [name], MD  DOB: 1970/11/24  DOS: 10/02/2021  Specialty: Interventional Pain Management  MRN: [id number]  Setting: Ambulatory outpatient  PCP: [facility] [facility]  Type: Established Patient    Referring Provider: [facility] [facility]  Location: Office  Delivery: Face-to-face     HPI  [name], a 50 y.o. year old female, is here today because of her Chronic bilateral low back pain with bilateral sciatica [M54.42, M54.41, G89.29]. [name] primary complain today is Back Pain (Lumbar bilateral ) Last encounter: My last encounter with her was on 08/15/2021. Pertinent problems: [name] has Multiple sclerosis ([place]); Neurogenic pain; Chronic sacroiliac joint pain (Bilateral) (L>R); Chronic pain syndrome; Chronic low back pain (1ry area of Pain) (Bilateral) (L>R) w/ sciatica (Bilateral); Chronic lower extremity pain (2ry area of Pain) (Bilateral) (R>L); Chronic low back pain (Bilateral) (L>R) w/o sciatica; Muscle spasticity; Spondylosis without myelopathy or radiculopathy, lumbosacral region; Lumbar facet syndrome (Bilateral) (R>L); Abnormal MRI, lumbar spine (06/16/2018); Lumbar facet hypertrophy (Multilevel) (Bilateral); Osteoarthritis of facet joint of lumbar spine; Osteoarthritis of lumbar spine; DDD (degenerative disc disease), lumbosacral; Lumbar spondylosis; Diabetic peripheral neuropathy ([place]); 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 Pain Assessment: Severity of Chronic pain is reported as a 3 /10. Location: Back Lower, Left, Right/into hips and legs. Onset: More than a month ago. Quality: Discomfort, Constant, Aching, Throbbing. Timing: Constant. Modifying factor(s): medications, rest. Vitals:  height is _0  (1.549 m) and weight is 200 lb (90.7 kg). Her temporal temperature is 98 F (36.7 C). Her blood pressure is 119/69 and her pulse is 75. Her respiration is 16 and oxygen saturation is 98%.   Reason for encounter: medication management.   The patient indicates doing well with the current medication regimen. No adverse reactions or side effects reported to the medications.   The patient was apparently sent in consultation by Danae Orleans, MD to Raymondo Band, MD, who apparently did bilateral SI joint injections on 09/05/2021.  RTCB: 12/31/2021 Nonopioids transfer 08/10/2020: Mobic, baclofen, and Lyrica.  Pharmacotherapy Assessment  Analgesic: Oxycodone IR 5 mg, 1 tab PO BID (10 mg/day of oxycodone)  MME/day: 15 mg/day.   Monitoring: [place] PMP: PDMP reviewed during this encounter.       Pharmacotherapy: No side-effects or adverse reactions reported. Compliance: No problems identified. Effectiveness: Clinically acceptable.  Janett Billow, RN  10/02/2021  3:19 PM  Sign when Signing Visit Nursing Pain Medication Assessment:  Safety precautions to be maintained throughout the outpatient stay will  include: orient to surroundings, keep bed in low position, maintain call bell within reach at all times, provide assistance with transfer out of bed and ambulation.  Medication Inspection Compliance: [name] did not comply with our  request to bring her pills to be counted. She was reminded that bringing the medication bottles, even when empty, is a requirement.  Medication: None brought in. Pill/Patch Count: None available to be counted. Bottle Appearance: No container available. Did not bring bottle(s) to appointment. Filled Date: N/A Last Medication intake:  Today    UDS:  Summary  Date Value Ref Range Status  12/28/2020 Note  Final    Comment:    ==================================================================== [employer] Select 13 (MW) ==================================================================== Test                             Result       Flag       Units  Drug Present and Declared for Prescription Verification   Oxycodone                      854          EXPECTED   ng/mg creat   Noroxycodone                   4742         EXPECTED   ng/mg creat    Sources of oxycodone include scheduled prescription medications.    Noroxycodone is an expected metabolite of oxycodone.    Phenobarbital                  PRESENT      EXPECTED    Phenobarbital is an expected metabolite of primidone; Phenobarbital    may also be administered as a prescription drug.  ==================================================================== Test                      Result    Flag   Units      Ref Range   Creatinine              69               mg/dL      >=20 ==================================================================== Declared Medications:  The flagging and interpretation on this report are based on the  following declared medications.  Unexpected results may arise from  inaccuracies in the declared medications.   **Note: The testing scope of this panel includes these  medications:   Oxycodone  Primidone (Mysoline)   **Note: The testing scope of this panel does not include the  following reported medications:   Acetaminophen (Excedrin)  Aspirin (Excedrin)  Baclofen (Lioresal)  Caffeine (Excedrin)  Duloxetine (Cymbalta)  Lamotrigine (Lamictal)  Levetiracetam (Keppra)  Loperamide  Meloxicam (Mobic)  Metformin  Mirabegron (Myrbetriq)  Omeprazole (Prilosec)  Pregabalin (Lyrica)  Propranolol (Inderal)  Sitagliptin (Januvia) ==================================================================== For clinical consultation, please call [phone]. ====================================================================      ROS  Constitutional: Denies any fever or chills Gastrointestinal: No reported hemesis, hematochezia, vomiting, or acute GI distress Musculoskeletal: Denies any acute onset joint swelling, redness, loss of ROM, or weakness Neurological: No reported episodes of acute onset apraxia, aphasia, dysarthria, agnosia, amnesia, paralysis, loss of coordination, or loss of consciousness  Medication Review  DULoxetine, Dimethyl Fumarate, aspirin-acetaminophen-caffeine, baclofen, lamoTRIgine, levETIRAcetam, loperamide, meloxicam, metFORMIN, mirabegron ER, omeprazole, oxyCODONE, pregabalin, primidone, propranolol, sertraline, and sitaGLIPtin  History Review  Allergy: [name] is allergic to 5-alpha reductase inhibitors, desvenlafaxine, glatiramer, rebif [interferon beta-1a], and latex. Drug: [name]  has no history on file for drug use. Alcohol:  reports no history of alcohol use. Tobacco:  reports that  she quit smoking about 4 years ago. Her smoking use included cigarettes. She has a 9.00 pack-year smoking history. She has quit using smokeless tobacco. Social: [name]  reports that she quit smoking about 4 years ago. Her smoking use included cigarettes. She has a 9.00 pack-year smoking history. She has quit using  smokeless tobacco. She reports that she does not drink alcohol. Medical:  has a past medical history of Acute postoperative pain (04/30/2019), Diabetes mellitus without complication ([place]), GERD (gastroesophageal reflux disease), Headache, MS (multiple sclerosis) (HCC), Numbness and tingling of both legs below knees, Osteoarthritis, Vertigo, and Wears dentures. Surgical: [name]  has a past surgical history that includes Abdominal hysterectomy; Cesarean section; Tumor removal (Left); Cataract extraction w/PHACO (Right, 03/18/2019); and Cataract extraction w/PHACO (Left, 04/13/2019). Family: family history is not on file. She was adopted.  Laboratory Chemistry Profile   Renal Lab Results  Component Value Date   BUN 7 11/06/2020   CREATININE 0.61 11/06/2020   BCR 13 11/27/2018   GFRAA >60 09/25/2019   GFRNONAA >60 11/06/2020    Hepatic Lab Results  Component Value Date   AST 29 11/06/2020   ALT 18 11/06/2020   ALBUMIN 2.8 (L) 11/06/2020   ALKPHOS 101 11/06/2020    Electrolytes Lab Results  Component Value Date   NA 138 11/06/2020   K 3.8 11/06/2020   CL 101 11/06/2020   CALCIUM 8.7 (L) 11/06/2020   MG 2.2 11/27/2018    Bone Lab Results  Component Value Date   25OHVITD1 55 11/27/2018   25OHVITD2 <1.0 11/27/2018   25OHVITD3 55 11/27/2018    Inflammation (CRP: Acute Phase) (ESR: Chronic Phase) Lab Results  Component Value Date   CRP 10 11/27/2018   ESRSEDRATE 42 (H) 11/27/2018         Note: Above Lab results reviewed.  Recent Imaging Review  DG PAIN CLINIC C-ARM 1-60 MIN NO REPORT Fluoro was used, but no Radiologist interpretation will be provided.  Please refer to "NOTES" tab for provider progress note. Note: Reviewed        Physical Exam  General appearance: Well nourished, well developed, and well hydrated. In no apparent acute distress Mental status: Alert, oriented x 3 (person, place, & time)       Respiratory: No evidence of acute respiratory  distress Eyes: PERLA Vitals: BP 119/69 (BP Location: Left Arm, Patient Position: Sitting, Cuff Size: Large)   Pulse 75   Temp 98 F (36.7 C) (Temporal)   Resp 16   Ht _0  (1.549 m)   Wt 200 lb (90.7 kg)   SpO2 98%   BMI 37.79 kg/m  BMI: Estimated body mass index is 37.79 kg/m as calculated from the following:   Height as of this encounter: _1  (1.549 m).   Weight as of this encounter: 200 lb (90.7 kg). Ideal: Ideal body weight: 47.8 kg (105 lb 6.1 oz) Adjusted ideal body weight: 65 kg (143 lb 3.6 oz)  Assessment   Status Diagnosis  Controlled Controlled Controlled 1. Chronic low back pain (1ry area of Pain) (Bilateral) (L>R) w/ sciatica (Bilateral)   2. DDD (degenerative disc disease), lumbosacral   3. Chronic sacroiliac joint pain (Bilateral) (L>R)   4. Lumbar facet syndrome (Bilateral) (R>L)   5. Chronic lower extremity pain (2ry area of Pain) (Bilateral) (R>L)   6. Chronic hip pain (Bilateral)   7. Chronic knee pain (Bilateral)   8. Multiple sclerosis ([place])   9. Chronic pain syndrome   10. Pharmacologic therapy  11. Chronic use of opiate for therapeutic purpose   12. Encounter for medication management      Updated Problems: No problems updated.  Plan of Care  Problem-specific:  No problem-specific Assessment & Plan notes found for this encounter.  Shannon Benitez has a current medication list which includes the following long-term medication(s): baclofen, duloxetine, lamotrigine, levetiracetam, meloxicam, metformin, omeprazole, oxycodone, [START ON 11/01/2021] oxycodone, [START ON 12/01/2021] oxycodone, pregabalin, primidone, propranolol, and sitagliptin.  Pharmacotherapy (Medications Ordered): Meds ordered this encounter  Medications   oxyCODONE (OXY IR/ROXICODONE) 5 MG immediate release tablet    Sig: Take 1 tablet (5 mg total) by mouth every 8 (eight) hours as needed for severe pain. Must last 30 days.    Dispense:  90 tablet    Refill:  0    DO  NOT: delete (not duplicate); no partial-fill (will deny script to complete), no refill request (F/U required). DISPENSE: 1 day early if closed on fill date. WARN: No CNS-depressants within 8 hrs of med.   oxyCODONE (OXY IR/ROXICODONE) 5 MG immediate release tablet    Sig: Take 1 tablet (5 mg total) by mouth every 8 (eight) hours as needed for severe pain. Must last 30 days.    Dispense:  90 tablet    Refill:  0    DO NOT: delete (not duplicate); no partial-fill (will deny script to complete), no refill request (F/U required). DISPENSE: 1 day early if closed on fill date. WARN: No CNS-depressants within 8 hrs of med.   oxyCODONE (OXY IR/ROXICODONE) 5 MG immediate release tablet    Sig: Take 1 tablet (5 mg total) by mouth every 8 (eight) hours as needed for severe pain. Must last 30 days.    Dispense:  90 tablet    Refill:  0    DO NOT: delete (not duplicate); no partial-fill (will deny script to complete), no refill request (F/U required). DISPENSE: 1 day early if closed on fill date. WARN: No CNS-depressants within 8 hrs of med.    Orders:  No orders of the defined types were placed in this encounter.  Follow-up plan:   Return in about 3 months (around 12/31/2021) for Eval-day (M,W), (F2F), (MM).      Interventional Therapies  Risk  Complexity Considerations:   Estimated body mass index is 37.79 kg/m as calculated from the following:   Height as of this encounter: $RemoveBeforeD'5\' 1"'[id number]$  (1.549 m).   Weight as of this encounter: 200 lb (90.7 kg). NOTE: NO RFA until BMI is <30 Continue encouraging the patient to bring her BMI below 35.   Planned  Pending:   Pending further evaluation   Under consideration:   Diagnostic bilateral T11-12 lumbar facet block   Diagnostic right T7-8 TESI   Diagnostic right T7 TFESI   Diagnostic ML CESI   Possible candidate for intrathecal pump trial and implant    Completed:   Palliative bilateral lumbar facet block x4 (04/20/2021)  Therapeutic left lumbar facet RFA  x3 (05/24/2020)  Therapeutic right lumbar facet RFA x3 (04/07/2020)    Therapeutic  Palliative (PRN) options:   Palliative bilateral lumbar facet block #5  Palliative left lumbar facet RFA #4 (05/24/2020)  Palliative right lumbar facet RFA #4 (04/07/2020)     Recent Visits Date Type Provider Dept  07/04/21 Office Visit Milinda Pointer, MD [facility]  Showing recent visits within past 90 days and meeting all other requirements Today's Visits Date Type Provider Dept  10/02/21 Office Visit Milinda Pointer, MD [facility]  Showing today's visits and meeting all other requirements Future Appointments Date Type Provider Dept  12/27/21 Appointment Milinda Pointer, MD [facility]  Showing future appointments within next 90 days and meeting all other requirements I discussed the assessment and treatment plan with the patient. The patient was provided an opportunity to ask questions and all were answered. The patient agreed with the plan and demonstrated an understanding of the instructions.  Patient advised to call back or seek an in-person evaluation if the symptoms or condition worsens.  Duration of encounter: 30 minutes.  Note by: [name], MD Date: 10/02/2021; Time: 3:31 PM

## 2021-10-02 ENCOUNTER — Encounter: Payer: Self-pay | Admitting: Pain Medicine

## 2021-10-02 ENCOUNTER — Ambulatory Visit: Payer: Medicaid Other | Attending: Pain Medicine | Admitting: Pain Medicine

## 2021-10-02 ENCOUNTER — Other Ambulatory Visit: Payer: Self-pay

## 2021-10-02 VITALS — BP 119/69 | HR 75 | Temp 98.0°F | Resp 16 | Ht 61.0 in | Wt 200.0 lb

## 2021-10-02 DIAGNOSIS — M79604 Pain in right leg: Secondary | ICD-10-CM | POA: Diagnosis present

## 2021-10-02 DIAGNOSIS — M79605 Pain in left leg: Secondary | ICD-10-CM

## 2021-10-02 DIAGNOSIS — G8929 Other chronic pain: Secondary | ICD-10-CM

## 2021-10-02 DIAGNOSIS — G35 Multiple sclerosis: Secondary | ICD-10-CM | POA: Insufficient documentation

## 2021-10-02 DIAGNOSIS — Z79899 Other long term (current) drug therapy: Secondary | ICD-10-CM

## 2021-10-02 DIAGNOSIS — M47816 Spondylosis without myelopathy or radiculopathy, lumbar region: Secondary | ICD-10-CM | POA: Insufficient documentation

## 2021-10-02 DIAGNOSIS — M25552 Pain in left hip: Secondary | ICD-10-CM | POA: Diagnosis present

## 2021-10-02 DIAGNOSIS — M25561 Pain in right knee: Secondary | ICD-10-CM | POA: Insufficient documentation

## 2021-10-02 DIAGNOSIS — Z79891 Long term (current) use of opiate analgesic: Secondary | ICD-10-CM | POA: Diagnosis present

## 2021-10-02 DIAGNOSIS — M5137 Other intervertebral disc degeneration, lumbosacral region: Secondary | ICD-10-CM | POA: Insufficient documentation

## 2021-10-02 DIAGNOSIS — M25551 Pain in right hip: Secondary | ICD-10-CM | POA: Diagnosis present

## 2021-10-02 DIAGNOSIS — M533 Sacrococcygeal disorders, not elsewhere classified: Secondary | ICD-10-CM | POA: Insufficient documentation

## 2021-10-02 DIAGNOSIS — G894 Chronic pain syndrome: Secondary | ICD-10-CM

## 2021-10-02 DIAGNOSIS — M5441 Lumbago with sciatica, right side: Secondary | ICD-10-CM | POA: Insufficient documentation

## 2021-10-02 DIAGNOSIS — M25562 Pain in left knee: Secondary | ICD-10-CM | POA: Diagnosis present

## 2021-10-02 DIAGNOSIS — M5442 Lumbago with sciatica, left side: Secondary | ICD-10-CM | POA: Diagnosis present

## 2021-10-02 MED ORDER — OXYCODONE HCL 5 MG PO TABS: 5.0000 mg | ORAL_TABLET | Freq: Three times a day (TID) | ORAL | 0 refills | Status: DC | PRN

## 2021-10-02 NOTE — Progress Notes (Signed)
Nursing Pain Medication Assessment:  Safety precautions to be maintained throughout the outpatient stay will include: orient to surroundings, keep bed in low position, maintain call bell within reach at all times, provide assistance with transfer out of bed and ambulation.  Medication Inspection Compliance: Shannon Benitez did not comply with our request to bring her pills to be counted. She was reminded that bringing the medication bottles, even when empty, is a requirement.  Medication: None brought in. Pill/Patch Count: None available to be counted. Bottle Appearance: No container available. Did not bring bottle(s) to appointment. Filled Date: N/A Last Medication intake:  Today

## 2021-10-05 ENCOUNTER — Ambulatory Visit: Payer: Medicaid Other | Admitting: Pain Medicine

## 2021-11-12 ENCOUNTER — Other Ambulatory Visit: Payer: Self-pay | Admitting: Pain Medicine

## 2021-11-12 DIAGNOSIS — M62838 Other muscle spasm: Secondary | ICD-10-CM

## 2021-11-12 DIAGNOSIS — M47816 Spondylosis without myelopathy or radiculopathy, lumbar region: Secondary | ICD-10-CM

## 2021-11-12 DIAGNOSIS — M792 Neuralgia and neuritis, unspecified: Secondary | ICD-10-CM

## 2021-11-12 DIAGNOSIS — E1142 Type 2 diabetes mellitus with diabetic polyneuropathy: Secondary | ICD-10-CM

## 2021-11-28 ENCOUNTER — Encounter: Payer: Self-pay | Admitting: Pain Medicine

## 2021-11-28 ENCOUNTER — Ambulatory Visit
Admission: RE | Admit: 2021-11-28 | Discharge: 2021-11-28 | Disposition: A | Discharge status: Home / Self Care | Payer: Medicaid Other | Source: Ambulatory Visit | Attending: Pain Medicine | Admitting: Pain Medicine

## 2021-11-28 ENCOUNTER — Other Ambulatory Visit: Payer: Self-pay

## 2021-11-28 ENCOUNTER — Ambulatory Visit (HOSPITAL_BASED_OUTPATIENT_CLINIC_OR_DEPARTMENT_OTHER): Payer: Medicaid Other | Admitting: Pain Medicine

## 2021-11-28 VITALS — BP 127/85 | HR 74 | Temp 97.3°F | Resp 16 | Ht 61.0 in | Wt 200.0 lb

## 2021-11-28 DIAGNOSIS — M545 Low back pain, unspecified: Secondary | ICD-10-CM | POA: Insufficient documentation

## 2021-11-28 DIAGNOSIS — R937 Abnormal findings on diagnostic imaging of other parts of musculoskeletal system: Secondary | ICD-10-CM | POA: Insufficient documentation

## 2021-11-28 DIAGNOSIS — Z6841 Body Mass Index (BMI) 40.0 and over, adult: Secondary | ICD-10-CM | POA: Diagnosis present

## 2021-11-28 DIAGNOSIS — G8929 Other chronic pain: Secondary | ICD-10-CM | POA: Diagnosis present

## 2021-11-28 DIAGNOSIS — M5137 Other intervertebral disc degeneration, lumbosacral region: Secondary | ICD-10-CM

## 2021-11-28 DIAGNOSIS — M47816 Spondylosis without myelopathy or radiculopathy, lumbar region: Secondary | ICD-10-CM

## 2021-11-28 DIAGNOSIS — M47817 Spondylosis without myelopathy or radiculopathy, lumbosacral region: Secondary | ICD-10-CM

## 2021-11-28 MED ORDER — LIDOCAINE HCL 2 % IJ SOLN
20.0000 mL | Freq: Once | INTRAMUSCULAR | Status: AC
  Administered 2021-11-28: 12:00:00 400 mg

## 2021-11-28 MED ORDER — TRIAMCINOLONE ACETONIDE 40 MG/ML IJ SUSP
80.0000 mg | Freq: Once | INTRAMUSCULAR | Status: AC
  Administered 2021-11-28: 12:00:00 40 mg

## 2021-11-28 MED ORDER — PENTAFLUOROPROP-TETRAFLUOROETH EX AERO
INHALATION_SPRAY | Freq: Once | CUTANEOUS | Status: DC
  Filled 2021-11-28: qty 116

## 2021-11-28 MED ORDER — ROPIVACAINE HCL 2 MG/ML IJ SOLN
INTRAMUSCULAR | Status: AC
  Filled 2021-11-28: qty 20

## 2021-11-28 MED ORDER — TRIAMCINOLONE ACETONIDE 40 MG/ML IJ SUSP
INTRAMUSCULAR | Status: AC
  Filled 2021-11-28: qty 2

## 2021-11-28 MED ORDER — LIDOCAINE HCL 2 % IJ SOLN
INTRAMUSCULAR | Status: AC
  Filled 2021-11-28: qty 60

## 2021-11-28 MED ORDER — ROPIVACAINE HCL 2 MG/ML IJ SOLN
18.0000 mL | Freq: Once | INTRAMUSCULAR | Status: AC
  Administered 2021-11-28: 12:00:00 20 mL via PERINEURAL

## 2021-11-28 NOTE — Patient Instructions (Signed)

## 2021-11-28 NOTE — Progress Notes (Signed)
Safety precautions to be maintained throughout the outpatient stay will include: orient to surroundings, keep bed in low position, maintain call bell within reach at all times, provide assistance with transfer out of bed and ambulation.  

## 2021-11-28 NOTE — Progress Notes (Signed)
PROVIDER NOTE: Interpretation of information contained herein should be left to medically-trained personnel. Specific patient instructions are provided elsewhere under "Patient Instructions" section of medical record. This document was created in part using STT-dictation technology, any transcriptional errors that may result from this process are unintentional.  Patient: Shannon Benitez Type: Established DOB: 1971/08/08 MRN: [id number] PCP: Danae Orleans, MD  Service: Procedure DOS: 11/28/2021 Setting: Ambulatory Location: Ambulatory outpatient facility Delivery: Face-to-face Provider: [name], MD Specialty: Interventional Pain Management Specialty designation: 09 Location: Outpatient facility Ref. Prov.: [facility], [place] [facility]    Primary Reason for Visit: Interventional Pain Management Treatment. CC: Back Pain (lower)   Procedure:           Type: Lumbar Facet, Medial Branch Block(s) #5  Laterality: Bilateral  Level: L2, L3, L4, L5, & S1 Medial Branch Level(s). Injecting these levels blocks the L3-4 and L5-S1 lumbar facet joints. Imaging: Fluoroscopic guidance Anesthesia: Local anesthesia (1-2% Lidocaine) Anxiolysis: Oral Valium 10 mg Sedation: None. DOS: 11/28/2021 Performed by: [name], MD  Primary Purpose: Diagnostic/Therapeutic Indications: Low back pain severe enough to impact quality of life or function. 1. Lumbar facet syndrome (Bilateral) (R>L)   2. Lumbar facet hypertrophy (Multilevel) (Bilateral)   3. Spondylosis without myelopathy or radiculopathy, lumbosacral region   4. Chronic low back pain (Bilateral) (L>R) w/o sciatica   5. DDD (degenerative disc disease), lumbosacral   6. Lumbar spondylosis   7. Abnormal MRI, lumbar spine (06/16/2018)    NAS-11 Pain score:   Pre-procedure: 10-Worst pain ever/10   Post-procedure: 0-No pain/10      RTCB: 12/31/2021  Position / Prep / Materials:  Position: Prone  Prep solution: DuraPrep (Iodine  Povacrylex [0.7% available iodine] and Isopropyl Alcohol, 74% w/w) Area Prepped: Posterolateral Lumbosacral Spine (Wide prep: From the lower border of the scapula down to the end of the tailbone and from flank to flank.)  Materials:  Tray: Block Needle(s):  Type: Spinal  Gauge (G): 22  Length: 5-in Qty: 4  Pre-op H&P Assessment:  Shannon Benitez is a 51 y.o. (year old), female patient, seen today for interventional treatment. She  has a past surgical history that includes Abdominal hysterectomy; Cesarean section; Tumor removal (Left); Cataract extraction w/PHACO (Right, 03/18/2019); and Cataract extraction w/PHACO (Left, 04/13/2019). Shannon Benitez has a current medication list which includes the following prescription(s): aspirin-acetaminophen-caffeine, dimethyl fumarate, duloxetine, lamotrigine, loperamide, mirabegron er, oxycodone, [START ON 12/01/2021] oxycodone, propranolol, sertraline, baclofen, levetiracetam, meloxicam, metformin, omeprazole, oxycodone, pregabalin, primidone, and sitagliptin, and the following Facility-Administered Medications: pentafluoroprop-tetrafluoroeth. Her primarily concern today is the Back Pain (lower)  Initial Vital Signs:  Pulse/HCG Rate: 74ECG Heart Rate: 73 Temp: (!) 97.3 F (36.3 C) Resp: 15 BP: 126/88 SpO2: 99 %  BMI: Estimated body mass index is 37.79 kg/m as calculated from the following:   Height as of this encounter: 5\' 1"  (1.549 m).   Weight as of this encounter: 200 lb (90.7 kg).  Risk Assessment: Allergies: Reviewed. She is allergic to 5-alpha reductase inhibitors, desvenlafaxine, glatiramer, rebif [interferon beta-1a], and latex.  Allergy Precautions: None required Coagulopathies: Reviewed. None identified.  Blood-thinner therapy: None at this time Active Infection(s): Reviewed. None identified. Shannon Benitez is afebrile  Site Confirmation: Shannon Benitez was asked to confirm the procedure and laterality before marking the  site Procedure checklist: Completed Consent: Before the procedure and under the influence of no sedative(s), amnesic(s), or anxiolytics, the patient was informed of the treatment options, risks and possible complications. To fulfill our ethical and legal obligations, as recommended  by the American Medical Association's Code of Ethics, I have informed the patient of my clinical impression; the nature and purpose of the treatment or procedure; the risks, benefits, and possible complications of the intervention; the alternatives, including doing nothing; the risk(s) and benefit(s) of the alternative treatment(s) or procedure(s); and the risk(s) and benefit(s) of doing nothing. The patient was provided information about the general risks and possible complications associated with the procedure. These may include, but are not limited to: failure to achieve desired goals, infection, bleeding, organ or nerve damage, allergic reactions, paralysis, and death. In addition, the patient was informed of those risks and complications associated to Spine-related procedures, such as failure to decrease pain; infection (i.e.: Meningitis, epidural or intraspinal abscess); bleeding (i.e.: epidural hematoma, subarachnoid hemorrhage, or any other type of intraspinal or peri-dural bleeding); organ or nerve damage (i.e.: Any type of peripheral nerve, nerve root, or spinal cord injury) with subsequent damage to sensory, motor, and/or autonomic systems, resulting in permanent pain, numbness, and/or weakness of one or several areas of the body; allergic reactions; (i.e.: anaphylactic reaction); and/or death. Furthermore, the patient was informed of those risks and complications associated with the medications. These include, but are not limited to: allergic reactions (i.e.: anaphylactic or anaphylactoid reaction(s)); adrenal axis suppression; blood sugar elevation that in diabetics may result in ketoacidosis or comma; water retention  that in patients with history of congestive heart failure may result in shortness of breath, pulmonary edema, and decompensation with resultant heart failure; weight gain; swelling or edema; medication-induced neural toxicity; particulate matter embolism and blood vessel occlusion with resultant organ, and/or nervous system infarction; and/or aseptic necrosis of one or more joints. Finally, the patient was informed that Medicine is not an exact science; therefore, there is also the possibility of unforeseen or unpredictable risks and/or possible complications that may result in a catastrophic outcome. The patient indicated having understood very clearly. We have given the patient no guarantees and we have made no promises. Enough time was given to the patient to ask questions, all of which were answered to the patient's satisfaction. Ms. Deak has indicated that she wanted to continue with the procedure. Attestation: I, the ordering provider, attest that I have discussed with the patient the benefits, risks, side-effects, alternatives, likelihood of achieving goals, and potential problems during recovery for the procedure that I have provided informed consent. Date   Time: 11/28/2021 11:31 AM  Pre-Procedure Preparation:  Monitoring: As per clinic protocol. Respiration, ETCO2, SpO2, BP, heart rate and rhythm monitor placed and checked for adequate function Safety Precautions: Patient was assessed for positional comfort and pressure points before starting the procedure. Time-out: I initiated and conducted the "Time-out" before starting the procedure, as per protocol. The patient was asked to participate by confirming the accuracy of the "Time Out" information. Verification of the correct person, site, and procedure were performed and confirmed by me, the nursing staff, and the patient. "Time-out" conducted as per Joint Commission's Universal Protocol (UP.01.01.01). Time: 1221  Description of Procedure:           Laterality: Bilateral. The procedure was performed in identical fashion on both sides. Levels:  L2, L3, L4, L5, & S1 Medial Branch Level(s)  Safety Precautions: Aspiration looking for blood return was conducted prior to all injections. At no point did we inject any substances, as a needle was being advanced. Before injecting, the patient was told to immediately notify me if she was experiencing any new onset of "ringing in the ears,  or metallic taste in the mouth". No attempts were made at seeking any paresthesias. Safe injection practices and needle disposal techniques used. Medications properly checked for expiration dates. SDV (single dose vial) medications used. After the completion of the procedure, all disposable equipment used was discarded in the proper designated medical waste containers. Local Anesthesia: Protocol guidelines were followed. The patient was positioned over the fluoroscopy table. The area was prepped in the usual manner. The time-out was completed. The target area was identified using fluoroscopy. A 12-in long, straight, sterile hemostat was used with fluoroscopic guidance to locate the targets for each level blocked. Once located, the skin was marked with an approved surgical skin marker. Once all sites were marked, the skin (epidermis, dermis, and hypodermis), as well as deeper tissues (fat, connective tissue and muscle) were infiltrated with a small amount of a short-acting local anesthetic, loaded on a 10cc syringe with a 25G, 1.5-in  Needle. An appropriate amount of time was allowed for local anesthetics to take effect before proceeding to the next step. Local Anesthetic: Lidocaine 2.0% The unused portion of the local anesthetic was discarded in the proper designated containers. Technical explanation of process:  L2 Medial Branch Nerve Block (MBB): The target area for the L2 medial branch is at the junction of the postero-lateral aspect of the superior articular process  and the superior, posterior, and medial edge of the transverse process of L3. Under fluoroscopic guidance, a Quincke needle was inserted until contact was made with os over the superior postero-lateral aspect of the pedicular shadow (target area). After negative aspiration for blood, 0.5 mL of the nerve block solution was injected without difficulty or complication. The needle was removed intact. L3 Medial Branch Nerve Block (MBB): The target area for the L3 medial branch is at the junction of the postero-lateral aspect of the superior articular process and the superior, posterior, and medial edge of the transverse process of L4. Under fluoroscopic guidance, a Quincke needle was inserted until contact was made with os over the superior postero-lateral aspect of the pedicular shadow (target area). After negative aspiration for blood, 0.5 mL of the nerve block solution was injected without difficulty or complication. The needle was removed intact. L4 Medial Branch Nerve Block (MBB): The target area for the L4 medial branch is at the junction of the postero-lateral aspect of the superior articular process and the superior, posterior, and medial edge of the transverse process of L5. Under fluoroscopic guidance, a Quincke needle was inserted until contact was made with os over the superior postero-lateral aspect of the pedicular shadow (target area). After negative aspiration for blood, 0.5 mL of the nerve block solution was injected without difficulty or complication. The needle was removed intact. L5 Medial Branch Nerve Block (MBB): The target area for the L5 medial branch is at the junction of the postero-lateral aspect of the superior articular process and the superior, posterior, and medial edge of the sacral ala. Under fluoroscopic guidance, a Quincke needle was inserted until contact was made with os over the superior postero-lateral aspect of the pedicular shadow (target area). After negative aspiration for  blood, 0.5 mL of the nerve block solution was injected without difficulty or complication. The needle was removed intact. S1 Medial Branch Nerve Block (MBB): The target area for the S1 medial branch is at the posterior and inferior 6 o'clock position of the L5-S1 facet joint. Under fluoroscopic guidance, the Quincke needle inserted for the L5 MBB was redirected until contact was made with  os over the inferior and postero aspect of the sacrum, at the 6 o' clock position under the L5-S1 facet joint (Target area). After negative aspiration for blood, 0.5 mL of the nerve block solution was injected without difficulty or complication. The needle was removed intact.  Once the entire procedure was completed, the treated area was cleaned, making sure to leave some of the prepping solution back to take advantage of its long term bactericidal properties.      Illustration of the posterior view of the lumbar spine and the posterior neural structures. Laminae of L2 through S1 are labeled. DPRL5, dorsal primary ramus of L5; DPRS1, dorsal primary ramus of S1; DPR3, dorsal primary ramus of L3; FJ, facet (zygapophyseal) joint L3-L4; I, inferior articular process of L4; LB1, lateral branch of dorsal primary ramus of L1; IAB, inferior articular branches from L3 medial branch (supplies L4-L5 facet joint); IBP, intermediate branch plexus; MB3, medial branch of dorsal primary ramus of L3; NR3, third lumbar nerve root; S, superior articular process of L5; SAB, superior articular branches from L4 (supplies L4-5 facet joint also); TP3, transverse process of L3.  Vitals:   11/28/21 1126 11/28/21 1225 11/28/21 1230  BP: 126/88 116/86 127/85  Pulse: 74    Resp: 15 15 16   Temp: (!) 97.3 F (36.3 C)    SpO2: 99% 98% 95%  Weight: 200 lb (90.7 kg)    Height: 5\' 1"  (1.549 m)       Start Time: 1221 hrs. End Time: 1230 hrs.  Imaging Guidance (Spinal):          Type of Imaging Technique: Fluoroscopy Guidance  (Spinal) Indication(s): Assistance in needle guidance and placement for procedures requiring needle placement in or near specific anatomical locations not easily accessible without such assistance. Exposure Time: Please see nurses notes. Contrast: None used. Fluoroscopic Guidance: I was personally present during the use of fluoroscopy. "Tunnel Vision Technique" used to obtain the best possible view of the target area. Parallax error corrected before commencing the procedure. "Direction-depth-direction" technique used to introduce the needle under continuous pulsed fluoroscopy. Once target was reached, antero-posterior, oblique, and lateral fluoroscopic projection used confirm needle placement in all planes. Images permanently stored in EMR. Interpretation: No contrast injected. I personally interpreted the imaging intraoperatively. Adequate needle placement confirmed in multiple planes. Permanent images saved into the patient's record.  Antibiotic Prophylaxis:   Anti-infectives (From admission, onward)    None      Indication(s): None identified  Post-operative Assessment:  Post-procedure Vital Signs:  Pulse/HCG Rate: 7475 Temp: (!) 97.3 F (36.3 C) Resp: 16 BP: 127/85 SpO2: 95 %  EBL: None  Complications: No immediate post-treatment complications observed by team, or reported by patient.  Note: The patient tolerated the entire procedure well. A repeat set of vitals were taken after the procedure and the patient was kept under observation following institutional policy, for this type of procedure. Post-procedural neurological assessment was performed, showing return to baseline, prior to discharge. The patient was provided with post-procedure discharge instructions, including a section on how to identify potential problems. Should any problems arise concerning this procedure, the patient was given instructions to immediately contact us, at any time, without hesitation. In any case, we  plan to contact the patient by telephone for a follow-up status report regarding this interventional procedure.  Comments:  No additional relevant information.  Plan of Care  Orders:  Orders Placed This Encounter  Procedures   LUMBAR FACET(MEDIAL BRANCH NERVE BLOCK) MBNB  Scheduling Instructions:     Procedure: Lumbar facet block (AKA.: Lumbosacral medial branch nerve block)     Side: Bilateral     Level: L3-4 & L5-S1 Facets (L2, L3, L4, L5, & S1 Medial Branch Nerves)     Sedation: Patient's choice.     Timeframe: Today    Order Specific Question:   Where will this procedure be performed?    Answer:   [facility]   DG [facility] C-ARM 1-60 MIN NO REPORT    Intraoperative interpretation by procedural physician at [place].    Standing Status:   Standing    Number of Occurrences:   1    Order Specific Question:   Reason for exam:    Answer:   Assistance in needle guidance and placement for procedures requiring needle placement in or near specific anatomical locations not easily accessible without such assistance.   Informed Consent Details: Physician/Practitioner Attestation; Transcribe to consent form and obtain patient signature    Nursing Order: Transcribe to consent form and obtain patient signature. Note: Always confirm laterality of pain with Ms. Lightsey, before procedure.    Order Specific Question:   Physician/Practitioner attestation of informed consent for procedure/surgical case    Answer:   I, the physician/practitioner, attest that I have discussed with the patient the benefits, risks, side effects, alternatives, likelihood of achieving goals and potential problems during recovery for the procedure that I have provided informed consent.    Order Specific Question:   Procedure    Answer:   Lumbar Facet Block  under fluoroscopic guidance    Order Specific Question:   Physician/Practitioner performing the procedure    Answer:   Avrielle Fry A. Dossie Arbour  MD    Order Specific Question:   Indication/Reason    Answer:   Low Back Pain, with our without leg pain, due to Facet Joint Arthralgia (Joint Pain) Spondylosis (Arthritis of the Spine), without myelopathy or radiculopathy (Nerve Damage).   Provide equipment / supplies at bedside    "Block Tray" (Disposable   single use) Needle type: SpinalSpinal Amount/quantity: 4 Size: Medium (5-inch) Gauge: 22G    Standing Status:   Standing    Number of Occurrences:   1    Order Specific Question:   Specify    Answer:   Block Tray   Chronic Opioid Analgesic:  Oxycodone IR 5 mg, 1 tab PO BID (10 mg/day of oxycodone)  MME/day: 15 mg/day.   Medications ordered for procedure: Meds ordered this encounter  Medications   lidocaine (XYLOCAINE) 2 % (with pres) injection 400 mg   pentafluoroprop-tetrafluoroeth (GEBAUERS) aerosol   ropivacaine (PF) 2 mg/mL (0.2%) (NAROPIN) injection 18 mL   triamcinolone acetonide (KENALOG-40) injection 80 mg   Medications administered: We administered lidocaine, ropivacaine (PF) 2 mg/mL (0.2%), and triamcinolone acetonide.  See the medical record for exact dosing, route, and time of administration.  Follow-up plan:   Return in about 2 weeks (around 12/12/2021) for Proc-day (T,Th), (VV), (PPE).        Interventional Therapies  Risk   Complexity Considerations:   Estimated body mass index is 37.79 kg/m as calculated from the following:   Height as of this encounter: 5\' 1"  (1.549 m).   Weight as of this encounter: 200 lb (90.7 kg). NOTE: NO RFA until BMI is <30 Continue encouraging the patient to bring her BMI below 35.   Planned   Pending:   Pending further evaluation   Under consideration:   Diagnostic bilateral  T11-12 lumbar facet block   Diagnostic right T7-8 TESI   Diagnostic right T7 TFESI   Diagnostic ML CESI   Possible candidate for intrathecal pump trial and implant    Completed:   Palliative bilateral lumbar facet block x4 (04/20/2021)  Therapeutic  left lumbar facet RFA x3 (05/24/2020)  Therapeutic right lumbar facet RFA x3 (04/07/2020)    Therapeutic   Palliative (PRN) options:   Palliative bilateral lumbar facet block #5  Palliative left lumbar facet RFA #4 (05/24/2020)  Palliative right lumbar facet RFA #4 (04/07/2020)     Recent Visits Date Type Provider Dept  10/02/21 Office Visit Milinda Pointer, MD [facility]  Showing recent visits within past 90 days and meeting all other requirements Today's Visits Date Type Provider Dept  11/28/21 Procedure visit Milinda Pointer, MD [facility]  Showing today's visits and meeting all other requirements Future Appointments Date Type Provider Dept  12/12/21 Appointment Milinda Pointer, [facility]  12/27/21 Appointment Milinda Pointer, MD [facility]  Showing future appointments within next 90 days and meeting all other requirements  Disposition: Discharge home  Discharge (Date   Time): 11/28/2021; 1240 hrs.   Primary Care Physician: Danae Orleans, MD Location: [facility] Note by: [name], MD Date: 11/28/2021; Time: 1:35 PM  Disclaimer:  Medicine is not an Chief Strategy Officer. The only guarantee in medicine is that nothing is guaranteed. It is important to note that the decision to proceed with this intervention was based on the information collected from the patient. The Data and conclusions were drawn from the patient's questionnaire, the interview, and the physical examination. Because the information was provided in large part by the patient, it cannot be guaranteed that it has not been purposely or unconsciously manipulated. Every effort has been made to obtain as much relevant data as possible for this evaluation. It is important to note that the conclusions that lead to this procedure are derived in large part from the available data. Always take into account that the treatment will also be  dependent on availability of resources and existing treatment guidelines, considered by other Pain Management Practitioners as being common knowledge and practice, at the time of the intervention. For Medico-Legal purposes, it is also important to point out that variation in procedural techniques and pharmacological choices are the acceptable norm. The indications, contraindications, technique, and results of the above procedure should only be interpreted and judged by a Board-Certified Interventional Pain Specialist with extensive familiarity and expertise in the same exact procedure and technique.

## 2021-11-28 NOTE — Progress Notes (Deleted)
Safety precautions to be maintained throughout the outpatient stay will include: orient to surroundings, keep bed in low position, maintain call bell within reach at all times, provide assistance with transfer out of bed and ambulation.  

## 2021-11-29 ENCOUNTER — Telehealth: Payer: Self-pay

## 2021-11-29 NOTE — Telephone Encounter (Signed)
Post procedure phone call.  LM 

## 2021-12-12 ENCOUNTER — Other Ambulatory Visit: Payer: Self-pay

## 2021-12-12 ENCOUNTER — Ambulatory Visit: Payer: Medicaid Other | Attending: Pain Medicine | Admitting: Pain Medicine

## 2021-12-12 DIAGNOSIS — M533 Sacrococcygeal disorders, not elsewhere classified: Secondary | ICD-10-CM

## 2021-12-12 DIAGNOSIS — M545 Low back pain, unspecified: Secondary | ICD-10-CM

## 2021-12-12 DIAGNOSIS — G8929 Other chronic pain: Secondary | ICD-10-CM

## 2021-12-12 DIAGNOSIS — M5137 Other intervertebral disc degeneration, lumbosacral region: Secondary | ICD-10-CM

## 2021-12-12 DIAGNOSIS — M47816 Spondylosis without myelopathy or radiculopathy, lumbar region: Secondary | ICD-10-CM

## 2021-12-12 NOTE — Progress Notes (Signed)
Unsuccessful attempt to contact patient for Virtual Visit (Pain Management Telehealth)   Patient provided contact information:  [phone] (home); [phone] (mobile); (Preferred) [phone] [email or web address]   Pre-screening:  Our staff was unsuccessful in contacting Ms. Suminski using the above provided information.   I unsuccessfully attempted to make contact with Jonie Fake on 12/12/2021 via telephone. I was unable to complete the virtual encounter due to call going directly to voicemail. I was able to leave a message where I clearly identify myself as [name], MD and I left a message to call us back to reschedule the call.  Pharmacotherapy Assessment  Analgesic: Oxycodone IR 5 mg, 1 tab PO BID (10 mg/day of oxycodone)  MME/day: 15 mg/day.   Follow-up plan:   Reschedule Visit.    Interventional Therapies  Risk   Complexity Considerations:   Estimated body mass index is 37.79 kg/m as calculated from the following:   Height as of this encounter: 5\' 1"  (1.549 m).   Weight as of this encounter: 200 lb (90.7 kg). NOTE: NO RFA until BMI is <30 Continue encouraging the patient to bring her BMI below 35.   Planned   Pending:   Pending further evaluation   Under consideration:   Diagnostic bilateral T11-12 lumbar facet block   Diagnostic right T7-8 TESI   Diagnostic right T7 TFESI   Diagnostic ML CESI   Possible candidate for intrathecal pump trial and implant    Completed:   Palliative bilateral lumbar facet block x4 (04/20/2021)  Therapeutic left lumbar facet RFA x3 (05/24/2020)  Therapeutic right lumbar facet RFA x3 (04/07/2020)    Therapeutic   Palliative (PRN) options:   Palliative bilateral lumbar facet block #5  Palliative left lumbar facet RFA #4 (05/24/2020)  Palliative right lumbar facet RFA #4 (04/07/2020)     Recent Visits Date Type Provider Dept  11/28/21 Procedure visit Milinda Pointer, MD [facility]  10/02/21 Office Visit  Milinda Pointer, MD [facility]  Showing recent visits within past 90 days and meeting all other requirements Today's Visits Date Type Provider Dept  12/12/21 Office Visit Milinda Pointer, MD [facility]  Showing today's visits and meeting all other requirements Future Appointments Date Type Provider Dept  12/27/21 Appointment Milinda Pointer, MD [facility]  Showing future appointments within next 90 days and meeting all other requirements   Note by: [name], MD Date: 12/12/2021; Time: 1:58 PM

## 2021-12-27 ENCOUNTER — Encounter: Payer: Medicaid Other | Admitting: Pain Medicine

## 2022-01-02 ENCOUNTER — Ambulatory Visit (HOSPITAL_BASED_OUTPATIENT_CLINIC_OR_DEPARTMENT_OTHER): Payer: Medicaid Other | Admitting: Pain Medicine

## 2022-01-02 ENCOUNTER — Ambulatory Visit
Admission: RE | Admit: 2022-01-02 | Discharge: 2022-01-02 | Disposition: A | Discharge status: Home / Self Care | Payer: Medicaid Other | Source: Ambulatory Visit | Attending: Pain Medicine | Admitting: Pain Medicine

## 2022-01-02 ENCOUNTER — Other Ambulatory Visit: Payer: Self-pay

## 2022-01-02 ENCOUNTER — Encounter: Payer: Self-pay | Admitting: Pain Medicine

## 2022-01-02 VITALS — BP 129/83 | HR 83 | Temp 97.2°F | Resp 16 | Ht 61.0 in | Wt 230.9 lb

## 2022-01-02 DIAGNOSIS — M5137 Other intervertebral disc degeneration, lumbosacral region: Secondary | ICD-10-CM | POA: Insufficient documentation

## 2022-01-02 DIAGNOSIS — M5441 Lumbago with sciatica, right side: Secondary | ICD-10-CM

## 2022-01-02 DIAGNOSIS — G894 Chronic pain syndrome: Secondary | ICD-10-CM

## 2022-01-02 DIAGNOSIS — G8929 Other chronic pain: Secondary | ICD-10-CM | POA: Insufficient documentation

## 2022-01-02 DIAGNOSIS — M25552 Pain in left hip: Secondary | ICD-10-CM

## 2022-01-02 DIAGNOSIS — M25562 Pain in left knee: Secondary | ICD-10-CM

## 2022-01-02 DIAGNOSIS — M79604 Pain in right leg: Secondary | ICD-10-CM | POA: Insufficient documentation

## 2022-01-02 DIAGNOSIS — M5442 Lumbago with sciatica, left side: Secondary | ICD-10-CM

## 2022-01-02 DIAGNOSIS — M25561 Pain in right knee: Secondary | ICD-10-CM | POA: Insufficient documentation

## 2022-01-02 DIAGNOSIS — M533 Sacrococcygeal disorders, not elsewhere classified: Secondary | ICD-10-CM

## 2022-01-02 DIAGNOSIS — M47816 Spondylosis without myelopathy or radiculopathy, lumbar region: Secondary | ICD-10-CM | POA: Insufficient documentation

## 2022-01-02 DIAGNOSIS — M25551 Pain in right hip: Secondary | ICD-10-CM

## 2022-01-02 DIAGNOSIS — Z79891 Long term (current) use of opiate analgesic: Secondary | ICD-10-CM

## 2022-01-02 DIAGNOSIS — G35 Multiple sclerosis: Secondary | ICD-10-CM

## 2022-01-02 DIAGNOSIS — M79605 Pain in left leg: Secondary | ICD-10-CM | POA: Insufficient documentation

## 2022-01-02 DIAGNOSIS — Z79899 Other long term (current) drug therapy: Secondary | ICD-10-CM

## 2022-01-02 MED ORDER — NALOXONE HCL 4 MG/0.1ML NA LIQD: 1.0000 | NASAL | 0 refills | Status: DC | PRN

## 2022-01-02 MED ORDER — OXYCODONE HCL 5 MG PO TABS: 5.0000 mg | ORAL_TABLET | Freq: Four times a day (QID) | ORAL | 0 refills | Status: DC | PRN

## 2022-01-02 NOTE — Progress Notes (Signed)
PROVIDER NOTE: Information contained herein reflects review and annotations entered in association with encounter. Interpretation of such information and data should be left to medically-trained personnel. Information provided to patient can be located elsewhere in the medical record under "Patient Instructions". Document created using STT-dictation technology, any transcriptional errors that may result from process are unintentional.    Patient: Shannon Benitez  Service Category: E/M  Provider: [name], MD  DOB: [birth year]  DOS: 01/02/2022  Specialty: Interventional Pain Management  MRN: [id number]  Setting: Ambulatory outpatient  PCP: Danae Orleans, MD  Type: Established Patient    Referring Provider: Danae Orleans, MD  Location: Office  Delivery: Face-to-face     HPI  [name], a 51 y.o. year old female, is here today because of her Chronic pain syndrome [G89.4]. [name] primary complain today is Back Pain (Lumbar bilateral ) Last encounter: My last encounter with her was on 12/12/2021. Pertinent problems: [name] has Multiple sclerosis ([place]); Neurogenic pain; Chronic sacroiliac joint pain (Bilateral) (L>R); Chronic pain syndrome; Chronic low back pain (1ry area of Pain) (Bilateral) (L>R) w/ sciatica (Bilateral); Chronic lower extremity pain (2ry area of Pain) (Bilateral) (R>L); Chronic low back pain (Bilateral) (L>R) w/o sciatica; Muscle spasticity; Spondylosis without myelopathy or radiculopathy, lumbosacral region; Lumbar facet syndrome (Bilateral) (R>L); Abnormal MRI, lumbar spine (06/16/2018); Lumbar facet hypertrophy (Multilevel) (Bilateral); Osteoarthritis of facet joint of lumbar spine; Osteoarthritis of lumbar spine; DDD (degenerative disc disease), lumbosacral; Lumbar spondylosis; Diabetic peripheral neuropathy ([place]); 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 Pain Assessment: Severity of Chronic pain is reported as a 5 /10. Location: Back Lower, Left, Right/into buttocks and tailbone. Onset: More than a month ago. Quality: Discomfort, Constant, Aching, Throbbing, Burning, Shooting. Timing: Constant. Modifying factor(s):  Marland Kitchen Vitals:  height is 5' 1"  (1.549 m) and weight is 230 lb 14.4 oz (104.7 kg). Her temporal temperature is 97.2 F (36.2 C) (abnormal). Her blood pressure is 129/83 and her pulse is 83. Her respiration is 16 and oxygen saturation is 96%.   Reason for encounter: medication management. The patient indicates doing well with the current medication regimen. No adverse reactions or side effects reported to the medications.  However, she indicates that it is not lasting.  She comes in today having gained 30 pounds from the last time that we saw her.  This has aggravated her back pain significantly.  She is also having some difficulty ambulating due to balance problems that are likely to be secondary to her multiple sclerosis.  Today we had a very long conversation regarding her weight and how she needs to bring that weight down to a BMI of 30.  This means bringing her weight down to no more than 160 pounds.  She is currently 70 pounds over that goal.  Today I will be ordering x-rays of her lumbar spine to evaluate for worsening of her lumbar spondylosis.  UDS ordered today.   RTCB: 04/02/2022 Nonopioids transfer 08/10/2020: Mobic, baclofen, and Lyrica.  Pharmacotherapy Assessment  Analgesic: Oxycodone  IR 5 mg, 1 tab PO BID (10 mg/day of oxycodone)  MME/day: 15 mg/day.  Monitoring: [place] PMP: PDMP reviewed during this encounter.       Pharmacotherapy: No side-effects or adverse reactions reported. Compliance: No problems identified. Effectiveness: Clinically acceptable.  Janett Billow, RN  01/02/2022  2:12 PM  Sign when Signing Visit Nursing Pain Medication Assessment:  Safety precautions to be maintained throughout the outpatient stay will include: orient to surroundings, keep bed in low position, maintain call bell within reach at all times, provide assistance with transfer out of bed and ambulation.  Medication Inspection Compliance: Pill count conducted under aseptic conditions, in front of the patient. Neither the pills nor the bottle was removed from the patient's sight at any time. Once count was completed pills were immediately returned to the patient in their original bottle.  Medication: Oxycodone IR Pill/Patch Count:  9 of 90 pills remain Pill/Patch Appearance: Markings consistent with prescribed medication Bottle Appearance: Standard pharmacy container. Clearly labeled. Filled Date: 01 / 27 / 2023 Last Medication intake:  Today    UDS:  Summary  Date Value Ref Range Status  12/28/2020 Note  Final    Comment:    ==================================================================== [employer]ox[employer] Select 13 (MW) ==================================================================== Test                             Result       Flag       Units  Drug Present and Declared for Prescription Verification   Oxycodone                      854          EXPECTED   ng/mg creat   Noroxycodone                   4742         EXPECTED   ng/mg creat    Sources of oxycodone include scheduled prescription medications.    Noroxycodone is an expected metabolite of oxycodone.    Phenobarbital                  PRESENT      EXPECTED    Phenobarbital is an expected metabolite of primidone;  Phenobarbital    may also be administered as a prescription drug.  ==================================================================== Test                      Result    Flag   Units      Ref Range   Creatinine              69               mg/dL      >=20 ==================================================================== Declared Medications:  The flagging and interpretation on this report are based on the  following declared medications.  Unexpected results may arise from  inaccuracies in the declared medications.   **Note: The testing scope of this panel includes these medications:   Oxycodone  Primidone (Mysoline)   **Note: The testing scope of this panel does not include the  following reported medications:   Acetaminophen (Excedrin)  Aspirin (Excedrin)  Baclofen (Lioresal)  Caffeine (Excedrin)  Duloxetine (Cymbalta)  Lamotrigine (Lamictal)  Levetiracetam (Keppra)  Loperamide  Meloxicam (Mobic)  Metformin  Mirabegron (Myrbetriq)  Omeprazole (Prilosec)  Pregabalin (Lyrica)  Propranolol (Inderal)  Sitagliptin (Januvia) ==================================================================== For clinical consultation, please call [phone]. ====================================================================      ROS  Constitutional: Denies any  fever or chills Gastrointestinal: No reported hemesis, hematochezia, vomiting, or acute GI distress Musculoskeletal: Denies any acute onset joint swelling, redness, loss of ROM, or weakness Neurological: No reported episodes of acute onset apraxia, aphasia, dysarthria, agnosia, amnesia, paralysis, loss of coordination, or loss of consciousness  Medication Review  DULoxetine, Dimethyl Fumarate, aspirin-acetaminophen-caffeine, baclofen, lamoTRIgine, levETIRAcetam, loperamide, meloxicam, metFORMIN, mirabegron ER, naloxone, omeprazole, oxyCODONE, pregabalin, primidone, propranolol, sertraline, and sitaGLIPtin  History  Review  Allergy: [name] is allergic to 5-alpha reductase inhibitors, desvenlafaxine, glatiramer, rebif [interferon beta-1a], and latex. Drug: [name]  has no history on file for drug use. Alcohol:  reports no history of alcohol use. Tobacco:  reports that she quit smoking about 5 years ago. Her smoking use included cigarettes. She has a 9.00 pack-year smoking history. She has quit using smokeless tobacco. Social: [name]  reports that she quit smoking about 5 years ago. Her smoking use included cigarettes. She has a 9.00 pack-year smoking history. She has quit using smokeless tobacco. She reports that she does not drink alcohol. Medical:  has a past medical history of Acute postoperative pain (04/30/2019), Diabetes mellitus without complication ([place]), GERD (gastroesophageal reflux disease), Headache, MS (multiple sclerosis) (HCC), Numbness and tingling of both legs below knees, Osteoarthritis, Vertigo, and Wears dentures. Surgical: [name]  has a past surgical history that includes Abdominal hysterectomy; Cesarean section; Tumor removal (Left); Cataract extraction w/PHACO (Right, 03/18/2019); and Cataract extraction w/PHACO (Left, 04/13/2019). Family: family history is not on file. She was adopted.  Laboratory Chemistry Profile   Renal Lab Results  Component Value Date   BUN 7 11/06/2020   CREATININE 0.61 11/06/2020   BCR 13 11/27/2018   GFRAA >60 09/25/2019   GFRNONAA >60 11/06/2020    Hepatic Lab Results  Component Value Date   AST 29 11/06/2020   ALT 18 11/06/2020   ALBUMIN 2.8 (L) 11/06/2020   ALKPHOS 101 11/06/2020    Electrolytes Lab Results  Component Value Date   NA 138 11/06/2020   K 3.8 11/06/2020   CL 101 11/06/2020   CALCIUM 8.7 (L) 11/06/2020   MG 2.2 11/27/2018    Bone Lab Results  Component Value Date   25OHVITD1 55 11/27/2018   25OHVITD2 <1.0 11/27/2018   25OHVITD3 55 11/27/2018    Inflammation (CRP: Acute Phase) (ESR: Chronic  Phase) Lab Results  Component Value Date   CRP 10 11/27/2018   ESRSEDRATE 42 (H) 11/27/2018         Note: Above Lab results reviewed.  Recent Imaging Review  DG PAIN CLINIC C-ARM 1-60 MIN NO REPORT Fluoro was used, but no Radiologist interpretation will be provided.  Please refer to "NOTES" tab for provider progress note. Note: Reviewed        Physical Exam  General appearance: Well nourished, well developed, and well hydrated. In no apparent acute distress Mental status: Alert, oriented x 3 (person, place, & time)       Respiratory: No evidence of acute respiratory distress Eyes: PERLA Vitals: BP 129/83 (BP Location: Left Arm, Patient Position: Sitting, Cuff Size: Normal) Comment (Cuff Size): forearm   Pulse 83    Temp (!) 97.2 F (36.2 C) (Temporal)    Resp 16    Ht 5' 1"  (1.549 m)    Wt 230 lb 14.4 oz (104.7 kg)    SpO2 96%    BMI 43.63 kg/m  BMI: Estimated body mass index is 43.63 kg/m as calculated from the following:   Height as of this encounter: 5'  1" (1.549 m).   Weight as of this encounter: 230 lb 14.4 oz (104.7 kg). Ideal: Ideal body weight: 47.8 kg (105 lb 6.1 oz) Adjusted ideal body weight: 70.6 kg (155 lb 9.4 oz)  Assessment   Status Diagnosis  Worsening Worsened Controlled 1. Chronic pain syndrome   2. Chronic low back pain (1ry area of Pain) (Bilateral) (L>R) w/ sciatica (Bilateral)   3. Chronic lower extremity pain (2ry area of Pain) (Bilateral) (R>L)   4. Lumbar facet syndrome (Bilateral) (R>L)   5. DDD (degenerative disc disease), lumbosacral   6. Chronic hip pain (Bilateral)   7. Chronic knee pain (Bilateral)   8. Chronic sacroiliac joint pain (Bilateral) (L>R)   9. Multiple sclerosis ([place])   10. Pharmacologic therapy   11. Chronic use of opiate for therapeutic purpose   12. Encounter for medication management      Updated Problems: No problems updated.  Plan of Care  Problem-specific:  No problem-specific Assessment & Plan notes found for  this encounter.  [name] has a current medication list which includes the following long-term medication(s): baclofen, duloxetine, lamotrigine, levetiracetam, meloxicam, metformin, omeprazole, oxycodone, [START ON 02/01/2022] oxycodone, [START ON 03/03/2022] oxycodone, pregabalin, primidone, propranolol, and sitagliptin.  Pharmacotherapy (Medications Ordered): Meds ordered this encounter  Medications   oxyCODONE (OXY IR/ROXICODONE) 5 MG immediate release tablet    Sig: Take 1 tablet (5 mg total) by mouth every 6 (six) hours as needed for severe pain. Must last 30 days.    Dispense:  120 tablet    Refill:  0    DO NOT: delete (not duplicate); no partial-fill (will deny script to complete), no refill request (F/U required). DISPENSE: 1 day early if closed on fill date. WARN: No CNS-depressants within 8 hrs of med.   oxyCODONE (OXY IR/ROXICODONE) 5 MG immediate release tablet    Sig: Take 1 tablet (5 mg total) by mouth every 6 (six) hours as needed for severe pain. Must last 30 days.    Dispense:  120 tablet    Refill:  0    DO NOT: delete (not duplicate); no partial-fill (will deny script to complete), no refill request (F/U required). DISPENSE: 1 day early if closed on fill date. WARN: No CNS-depressants within 8 hrs of med.   oxyCODONE (OXY IR/ROXICODONE) 5 MG immediate release tablet    Sig: Take 1 tablet (5 mg total) by mouth every 6 (six) hours as needed for severe pain. Must last 30 days.    Dispense:  120 tablet    Refill:  0    DO NOT: delete (not duplicate); no partial-fill (will deny script to complete), no refill request (F/U required). DISPENSE: 1 day early if closed on fill date. WARN: No CNS-depressants within 8 hrs of med.   naloxone (NARCAN) nasal spray 4 mg/0.1 mL    Sig: Place 1 spray into the nose as needed for up to 365 doses (for opioid-induced respiratory depresssion). In case of emergency (overdose), spray once into each nostril. If no response within 3  minutes, repeat application and call [phone].    Dispense:  1 each    Refill:  0    Instruct patient in proper use of device.   Orders:  Orders Placed This Encounter  Procedures   DG Lumbar Spine Complete W/Bend    Patient presents with axial pain with possible radicular component. Please assist [place] in identifying specific level(s) and laterality of any additional findings such as: 1. Facet (Zygapophyseal) joint DJD (Hypertrophy,  space narrowing, subchondral sclerosis, and/or osteophyte formation) 2. DDD and/or IVDD (Loss of disc height, desiccation, gas patterns, osteophytes, endplate sclerosis, or "Black disc disease") 3. Pars defects 4. Spondylolisthesis, spondylosis, and/or spondyloarthropathies (include Degree/Grade of displacement in mm) (stability) 5. Vertebral body Fractures (acute/chronic) (state percentage of collapse) 6. Demineralization (osteopenia/osteoporotic) 7. Bone pathology 8. Foraminal narrowing  9. Surgical changes    Standing Status:   Future    Standing Expiration Date:   01/30/2022    Scheduling Instructions:     Imaging must be done as soon as possible. Inform patient that order will expire within 30 days and I will not renew it.    Order Specific Question:   Reason for Exam (SYMPTOM  OR DIAGNOSIS REQUIRED)    Answer:   Low back pain    Order Specific Question:   Is patient pregnant?    Answer:   No    Order Specific Question:   Preferred imaging location?    Answer:   [facility]    Order Specific Question:   Call Results- Best Contact Number?    Answer:   [phone] ([facility])    Order Specific Question:   Radiology Contrast Protocol - do NOT remove file path    Answer:   [email or web address]    Order Specific Question:   Release to patient    Answer:   Immediate   ToxASSURE Select 13 (MW), Urine    Volume: 30 ml(s). Minimum 3 ml of urine is needed. Document temperature of fresh sample. Indications: Long  term (current) use of opiate analgesic [phone])    Order Specific Question:   Release to patient    Answer:   Immediate   Follow-up plan:   Return in about 2 weeks (around 01/16/2022) for Eval-day (M,W), (VV), for review of ordered tests (lumbar spine x-rays).     Interventional Therapies  Risk   Complexity Considerations:   Estimated body mass index is 37.79 kg/m as calculated from the following:   Height as of this encounter: 5' 1"  (1.549 m).   Weight as of this encounter: 200 lb (90.7 kg). NOTE: NO RFA until BMI is <30 Continue encouraging the patient to bring her BMI below 35.   Planned   Pending:   Pending further evaluation   Under consideration:   Diagnostic bilateral T11-12 lumbar facet block   Diagnostic right T7-8 TESI   Diagnostic right T7 TFESI   Diagnostic ML CESI   Possible candidate for intrathecal pump trial and implant    Completed:   Palliative bilateral lumbar facet block x4 (04/20/2021)  Therapeutic left lumbar facet RFA x3 (05/24/2020)  Therapeutic right lumbar facet RFA x3 (04/07/2020)    Completed by other providers:   Therapeutic/diagnostic bilateral SI BLK x1 (09/05/2021) by Reece Leader, MD (PMR)    Therapeutic   Palliative (PRN) options:   Palliative bilateral lumbar facet block #5  Palliative left lumbar facet RFA #4 (05/24/2020)  Palliative right lumbar facet RFA #4 (04/07/2020)     Recent Visits Date Type Provider Dept  11/28/21 Procedure visit Milinda Pointer, MD [facility]  Showing recent visits within past 90 days and meeting all other requirements Today's Visits Date Type Provider Dept  01/02/22 Office Visit Milinda Pointer, MD [facility]  Showing today's visits and meeting all other requirements Future Appointments No visits were found meeting these conditions. Showing future appointments within next 90 days and meeting all other requirements  I discussed the assessment and treatment plan  with the  patient. The patient was provided an opportunity to ask questions and all were answered. The patient agreed with the plan and demonstrated an understanding of the instructions.  Patient advised to call back or seek an in-person evaluation if the symptoms or condition worsens.  Duration of encounter: 30 minutes.  Note by: [name], MD Date: 01/02/2022; Time: 3:09 PM

## 2022-01-02 NOTE — Progress Notes (Signed)
Nursing Pain Medication Assessment:  Safety precautions to be maintained throughout the outpatient stay will include: orient to surroundings, keep bed in low position, maintain call bell within reach at all times, provide assistance with transfer out of bed and ambulation.  Medication Inspection Compliance: Pill count conducted under aseptic conditions, in front of the patient. Neither the pills nor the bottle was removed from the patient's sight at any time. Once count was completed pills were immediately returned to the patient in their original bottle.  Medication: Oxycodone IR Pill/Patch Count:  9 of 90 pills remain Pill/Patch Appearance: Markings consistent with prescribed medication Bottle Appearance: Standard pharmacy container. Clearly labeled. Filled Date: 01 / 27 / 2023 Last Medication intake:  Today

## 2022-01-02 NOTE — Patient Instructions (Addendum)
______________________________________________________________________________________________  Body mass index (BMI)  Body mass index (BMI) is a common tool for deciding whether a person has an appropriate body weight.  It measures a persons weight in relation to their height.   According to the [facility] of health (NIH): A BMI of less than 18.5 means that a person is underweight. A BMI of between 18.5 and 24.9 is ideal. A BMI of between 25 and 29.9 is overweight. A BMI over 30 indicates obesity.  Weight Management Required  URGENT: Your weight has been found to be adversely affecting your health.  Dear [name]:  Your current Estimated body mass index is 43.63 kg/m as calculated from the following:   Height as of this encounter: _0  (1.549 m).   Weight as of this encounter: 230 lb 14.4 oz (104.7 kg).  Please use the table below to identify your weight category and associated incidence of chronic pain, secondary to your weight.  Body Mass Index (BMI) Classification BMI level (kg/m2) Category Associated incidence of chronic pain  <18  Underweight   18.5-24.9 Ideal body weight   25-29.9 Overweight  20%  30-34.9 Obese (Class I)  68%  35-39.9 Severe obesity (Class II)  136%  >40 Extreme obesity (Class III)  254%   In addition: You will be considered "Morbidly Obese", if your BMI is above 30 and you have one or more of the following conditions which are known to be caused and/or directly associated with obesity: 1.    Type 2 Diabetes (Which in turn can lead to cardiovascular diseases (CVD), stroke, peripheral vascular diseases (PVD), retinopathy, nephropathy, and neuropathy) 2.    Cardiovascular Disease (High Blood Pressure; Congestive Heart Failure; High Cholesterol; Coronary Artery Disease; Angina; or History of Heart Attacks) 3.    Breathing problems (Asthma; obesity-hypoventilation syndrome; obstructive sleep apnea; chronic inflammatory airway disease; reactive  airway disease; or shortness of breath) 4.    Chronic kidney disease 5.    Liver disease (nonalcoholic fatty liver disease) 6.    High blood pressure 7.    Acid reflux (gastroesophageal reflux disease; heartburn) 8.    Osteoarthritis (OA) (with any of the following: hip pain; knee pain; and/or low back pain) 9.    Low back pain (Lumbar Facet Syndrome; and/or Degenerative Disc Disease) 10.  Hip pain (Osteoarthritis of hip) (For every 1 lbs of added body weight, there is a 2 lbs increase in pressure inside of each hip articulation. 1:2 mechanical relationship) 11.  Knee pain (Osteoarthritis of knee) (For every 1 lbs of added body weight, there is a 4 lbs increase in pressure inside of each knee articulation. 1:4 mechanical relationship) (patients with a BMI>30 kg/m2 were 6.8 times more likely to develop knee OA than normal-weight individuals) 12.  Cancer: Epidemiological studies have shown that obesity is a risk factor for: post-menopausal breast cancer; cancers of the endometrium, colon and kidney cancer; malignant adenomas of the oesophagus. Obese subjects have an approximately 1.5-3.5-fold increased risk of developing these cancers compared with normal-weight subjects, and it has been estimated that between 15 and 45% of these cancers can be attributed to overweight. More recent studies suggest that obesity may also increase the risk of other types of cancer, including pancreatic, hepatic and gallbladder cancer. (Ref: Obesity and cancer. Pischon T, Nthlings U, [employer] H. Proc Nutr Soc. 2008 May;67(2):128-45. doi: 31.5400/Q6761950932671245.) The International Agency for Research on Cancer (IARC) has identified 13 cancers associated with overweight and obesity: meningioma, multiple myeloma, adenocarcinoma of the esophagus, and  cancers of the thyroid, postmenopausal breast cancer, gallbladder, stomach, liver, pancreas, kidney, ovaries, uterus, colon and rectal (colorectal) cancers. 21 percent of all cancers  diagnosed in women and 24 percent of those diagnosed in men are associated with overweight and obesity.  Recommendation: At this point it is urgent that you take a step back and concentrate in loosing weight. Dedicate 100% of your efforts on this task. Nothing else will improve your health more than bringing your weight down and your BMI to less than 30. If you are here, you probably have chronic pain. We know that most chronic pain patients have difficulty exercising secondary to their pain. For this reason, you must rely on proper nutrition and diet in order to lose the weight. If your BMI is above 40, you should seriously consider bariatric surgery. A realistic goal is to lose 10% of your body weight over a period of 12 months.  Be honest to yourself, if over time you have unsuccessfully tried to lose weight, then it is time for you to seek professional help and to enter a medically supervised weight management program, and/or undergo bariatric surgery. Stop procrastinating.   Pain management considerations:  1.    Pharmacological Problems: Be advised that the use of opioid analgesics (oxycodone; hydrocodone; morphine; methadone; codeine; and all of their derivatives) have been associated with decreased metabolism and weight gain.  For this reason, should we see that you are unable to lose weight while taking these medications, it may become necessary for [place] to taper down and indefinitely discontinue them.  2.    Technical Problems: The incidence of successful interventional therapies decreases as the patient's BMI increases. It is much more difficult to accomplish a safe and effective interventional therapy on a patient with a BMI above 35. 3.    Radiation Exposure Problems: The x-rays machine, used to accomplish injection therapies, will automatically increase their x-ray output in order to capture an appropriate bone image. This means that radiation exposure increases exponentially with the patient's  BMI. (The higher the BMI, the higher the radiation exposure.) Although the level of radiation used at a given time is still safe to the patient, it is not for the physician and/or assisting staff. Unfortunately, radiation exposure is accumulative. Because physicians and the staff have to do procedures and be exposed on a daily basis, this can result in health problems such as cancer and radiation burns. Radiation exposure to the staff is monitored by the radiation batches that they wear. The exposure levels are reported back to the staff on a quarterly basis. Depending on levels of exposure, physicians and staff may be obligated by law to decrease this exposure. This means that they have the right and obligation to refuse providing therapies where they may be overexposed to radiation. For this reason, physicians may decline to offer therapies such as radiofrequency ablation or implants to patients with a BMI above 40. 4.    Current Trends: Be advised that the current trend is to no longer offer certain therapies to patients with a BMI equal to, or above 35, due to increase perioperative risks, increased technical procedural difficulties, and excessive radiation exposure to healthcare personnel.  ______________________________________________________________________________________________    ____________________________________________________________________________________________  Drug Holidays (Slow)  What is a "Drug Holiday"? Drug Holiday: is the name given to the period of time during which a patient stops taking a medication(s) for the purpose of eliminating tolerance to the drug.  Benefits Improved effectiveness of opioids. Decreased opioid dose needed  to achieve benefits. Improved pain with lesser dose.  What is tolerance? Tolerance: is the progressive decreased in effectiveness of a drug due to its repetitive use. With repetitive use, the body gets use to the medication and as a  consequence, it loses its effectiveness. This is a common problem seen with opioid pain medications. As a result, a larger dose of the drug is needed to achieve the same effect that used to be obtained with a smaller dose.  How long should a "Drug Holiday" last? You should stay off of the pain medicine for at least 14 consecutive days. (2 weeks)  Should I stop the medicine "cold [place]"? No. You should always coordinate with your Pain Specialist so that he/she can provide you with the correct medication dose to make the transition as smoothly as possible.  How do I stop the medicine? Slowly. You will be instructed to decrease the daily amount of pills that you take by one (1) pill every seven (7) days. This is called a "slow downward taper" of your dose. For example: if you normally take four (4) pills per day, you will be asked to drop this dose to three (3) pills per day for seven (7) days, then to two (2) pills per day for seven (7) days, then to one (1) per day for seven (7) days, and at the end of those last seven (7) days, this is when the "Drug Holiday" would start.   Will I have withdrawals? By doing a "slow downward taper" like this one, it is unlikely that you will experience any significant withdrawal symptoms. Typically, what triggers withdrawals is the sudden stop of a high dose opioid therapy. Withdrawals can usually be avoided by slowly decreasing the dose over a prolonged period of time. If you do not follow these instructions and decide to stop your medication abruptly, withdrawals may be possible.  What are withdrawals? Withdrawals: refers to the wide range of symptoms that occur after stopping or dramatically reducing opiate drugs after heavy and prolonged use. Withdrawal symptoms do not occur to patients that use low dose opioids, or those who take the medication sporadically. Contrary to benzodiazepine (example: Valium, Xanax, etc.) or alcohol withdrawals (Delirium Tremens),  opioid withdrawals are not lethal. Withdrawals are the physical manifestation of the body getting rid of the excess receptors.  Expected Symptoms Early symptoms of withdrawal may include: Agitation Anxiety Muscle aches Increased tearing Insomnia Runny nose Sweating Yawning  Late symptoms of withdrawal may include: Abdominal cramping Diarrhea Dilated pupils Goose bumps Nausea Vomiting  Will I experience withdrawals? Due to the slow nature of the taper, it is very unlikely that you will experience any.  What is a slow taper? Taper: refers to the gradual decrease in dose.  (Last update: 05/25/2020) ____________________________________________________________________________________________   ____________________________________________________________________________________________  Medication Rules  Purpose: To inform patients, and their family members, of our rules and regulations.  Applies to: All patients receiving prescriptions (written or electronic).  Pharmacy of record: Pharmacy where electronic prescriptions will be sent. If written prescriptions are taken to a different pharmacy, please inform the nursing staff. The pharmacy listed in the electronic medical record should be the one where you would like electronic prescriptions to be sent.  Electronic prescriptions: In compliance with the [place] (STOP) Act of 2017 (Session Lanny Cramp [phone]), effective November 05, 2018, all controlled substances must be electronically prescribed. Calling prescriptions to the pharmacy will cease to exist.  Prescription refills: Only during scheduled appointments.  Applies to all prescriptions.  NOTE: The following applies primarily to controlled substances (Opioid* Pain Medications).   Type of encounter (visit): For patients receiving controlled substances, face-to-face visits are required. (Not an option or up to the patient.)  Patient's  responsibilities: Pain Pills: Bring all pain pills to every appointment (except for procedure appointments). Pill Bottles: Bring pills in original pharmacy bottle. Always bring the newest bottle. Bring bottle, even if empty. Medication refills: You are responsible for knowing and keeping track of what medications you take and those you need refilled. The day before your appointment: write a list of all prescriptions that need to be refilled. The day of the appointment: give the list to the admitting nurse. Prescriptions will be written only during appointments. No prescriptions will be written on procedure days. If you forget a medication: it will not be "Called in", "Faxed", or "electronically sent". You will need to get another appointment to get these prescribed. No early refills. Do not call asking to have your prescription filled early. Prescription Accuracy: You are responsible for carefully inspecting your prescriptions before leaving our office. Have the discharge nurse carefully go over each prescription with you, before taking them home. Make sure that your name is accurately spelled, that your address is correct. Check the name and dose of your medication to make sure it is accurate. Check the number of pills, and the written instructions to make sure they are clear and accurate. Make sure that you are given enough medication to last until your next medication refill appointment. Taking Medication: Take medication as prescribed. When it comes to controlled substances, taking less pills or less frequently than prescribed is permitted and encouraged. Never take more pills than instructed. Never take medication more frequently than prescribed.  Inform other Doctors: Always inform, all of your healthcare providers, of all the medications you take. Pain Medication from other Providers: You are not allowed to accept any additional pain medication from any other Doctor or Healthcare provider. There  are two exceptions to this rule. (see below) In the event that you require additional pain medication, you are responsible for notifying us, as stated below. Cough Medicine: Often these contain an opioid, such as codeine or hydrocodone. Never accept or take cough medicine containing these opioids if you are already taking an opioid* medication. The combination may cause respiratory failure and death. Medication Agreement: You are responsible for carefully reading and following our Medication Agreement. This must be signed before receiving any prescriptions from our practice. Safely store a copy of your signed Agreement. Violations to the Agreement will result in no further prescriptions. (Additional copies of our Medication Agreement are available upon request.) Laws, Rules, & Regulations: All patients are expected to follow all Federal and [employer], [employer], Rules, [employer]. Ignorance of the Laws does not constitute a valid excuse.  Illegal drugs and Controlled Substances: The use of illegal substances (including, but not limited to marijuana and its derivatives) and/or the illegal use of any controlled substances is strictly prohibited. Violation of this rule may result in the immediate and permanent discontinuation of any and all prescriptions being written by our practice. The use of any illegal substances is prohibited. Adopted CDC guidelines & recommendations: Target dosing levels will be at or below 60 MME/day. Use of benzodiazepines** is not recommended.  Exceptions: There are only two exceptions to the rule of not receiving pain medications from other Healthcare Providers. Exception #1 (Emergencies): In the event of an emergency (i.e.: accident requiring  emergency care), you are allowed to receive additional pain medication. However, you are responsible for: As soon as you are able, call our office [phone], at any time of the day or night, and leave a message stating your name, the  date and nature of the emergency, and the name and dose of the medication prescribed. In the event that your call is answered by a member of our staff, make sure to document and save the date, time, and the name of the person that took your information.  Exception #2 (Planned Surgery): In the event that you are scheduled by another doctor or dentist to have any type of surgery or procedure, you are allowed (for a period no longer than 30 days), to receive additional pain medication, for the acute post-op pain. However, in this case, you are responsible for picking up a copy of our "Post-op Pain Management for Surgeons" handout, and giving it to your surgeon or dentist. This document is available at our office, and does not require an appointment to obtain it. Simply go to our office during business hours (Monday-Thursday from 8:00 AM to 4:00 PM) (Friday 8:00 AM to 12:00 Noon) or if you have a scheduled appointment with [place], prior to your surgery, and ask for it by name. In addition, you are responsible for: calling our office [phone], at any time of the day or night, and leaving a message stating your name, name of your surgeon, type of surgery, and date of procedure or surgery. Failure to comply with your responsibilities may result in termination of therapy involving the controlled substances. Medication Agreement Violation. Following the above rules, including your responsibilities will help you in avoiding a Medication Agreement Violation (Breaking your Pain Medication Contract).  *Opioid medications include: morphine, codeine, oxycodone, oxymorphone, hydrocodone, hydromorphone, meperidine, tramadol, tapentadol, buprenorphine, fentanyl, methadone. **Benzodiazepine medications include: diazepam (Valium), alprazolam (Xanax), clonazepam (Klonopine), lorazepam (Ativan), clorazepate (Tranxene), chlordiazepoxide (Librium), estazolam (Prosom), oxazepam (Serax), temazepam (Restoril), triazolam  (Halcion) (Last updated: 08/02/2021) ____________________________________________________________________________________________  ____________________________________________________________________________________________  Medication Recommendations and Reminders  Applies to: All patients receiving prescriptions (written and/or electronic).  Medication Rules & Regulations: These rules and regulations exist for your safety and that of others. They are not flexible and neither are we. Dismissing or ignoring them will be considered "non-compliance" with medication therapy, resulting in complete and irreversible termination of such therapy. (See document titled "Medication Rules" for more details.) In all conscience, because of safety reasons, we cannot continue providing a therapy where the patient does not follow instructions.  Pharmacy of record:  Definition: This is the pharmacy where your electronic prescriptions will be sent.  We do not endorse any particular pharmacy, however, we have experienced problems with [facility] not securing enough medication supply for the community. We do not restrict you in your choice of pharmacy. However, once we write for your prescriptions, we will NOT be re-sending more prescriptions to fix restricted supply problems created by your pharmacy, or your insurance.  The pharmacy listed in the electronic medical record should be the one where you want electronic prescriptions to be sent. If you choose to change pharmacy, simply notify our nursing staff.  Recommendations: Keep all of your pain medications in a safe place, under lock and key, even if you live alone. We will NOT replace lost, stolen, or damaged medication. After you fill your prescription, take 1 week's worth of pills and put them away in a safe place. You should keep a separate, properly labeled bottle for  this purpose. The remainder should be kept in the original bottle. Use this as your primary  supply, until it runs out. Once it's gone, then you know that you have 1 week's worth of medicine, and it is time to come in for a prescription refill. If you do this correctly, it is unlikely that you will ever run out of medicine. To make sure that the above recommendation works, it is very important that you make sure your medication refill appointments are scheduled at least 1 week before you run out of medicine. To do this in an effective manner, make sure that you do not leave the office without scheduling your next medication management appointment. Always ask the nursing staff to show you in your prescription , when your medication will be running out. Then arrange for the receptionist to get you a return appointment, at least 7 days before you run out of medicine. Do not wait until you have 1 or 2 pills left, to come in. This is very poor planning and does not take into consideration that we may need to cancel appointments due to bad weather, sickness, or emergencies affecting our staff. DO NOT ACCEPT A "Partial Fill": If for any reason your pharmacy does not have enough pills/tablets to completely fill or refill your prescription, do not allow for a "partial fill". The law allows the pharmacy to complete that prescription within 72 hours, without requiring a new prescription. If they do not fill the rest of your prescription within those 72 hours, you will need a separate prescription to fill the remaining amount, which we will NOT provide. If the reason for the partial fill is your insurance, you will need to talk to the pharmacist about payment alternatives for the remaining tablets, but again, DO NOT ACCEPT A PARTIAL FILL, unless you can trust your pharmacist to obtain the remainder of the pills within 72 hours.  Prescription refills and/or changes in medication(s):  Prescription refills, and/or changes in dose or medication, will be conducted only during scheduled medication management  appointments. (Applies to both, written and electronic prescriptions.) No refills on procedure days. No medication will be changed or started on procedure days. No changes, adjustments, and/or refills will be conducted on a procedure day. Doing so will interfere with the diagnostic portion of the procedure. No phone refills. No medications will be "called into the pharmacy". No Fax refills. No weekend refills. No Holliday refills. No after hours refills.  Remember:  Business hours are:  Monday to Thursday 8:00 AM to 4:00 PM Provider's Schedule: Milinda Pointer, MD - Appointments are:  Medication management: Monday and Wednesday 8:00 AM to 4:00 PM Procedure day: Tuesday and Thursday 7:30 AM to 4:00 PM Gillis Santa, MD - Appointments are:  Medication management: Tuesday and Thursday 8:00 AM to 4:00 PM Procedure day: Monday and Wednesday 7:30 AM to 4:00 PM (Last update: 05/25/2020) ____________________________________________________________________________________________  ____________________________________________________________________________________________  CBD (cannabidiol) & Delta-8 (Delta-8 tetrahydrocannabinol) WARNING  Intro: Cannabidiol (CBD) and tetrahydrocannabinol (THC), are two natural compounds found in plants of the Cannabis genus. They can both be extracted from hemp or cannabis. Hemp and cannabis come from the Cannabis sativa plant. Both compounds interact with your bodys endocannabinoid system, but they have very different effects. CBD does not produce the high sensation associated with cannabis. Delta-8 tetrahydrocannabinol, also known as delta-8 THC, is a psychoactive substance found in the Cannabis sativa plant, of which marijuana and hemp are two varieties. THC is responsible for the high associated with  the illicit use of marijuana.  Applicable to: All individuals currently taking or considering taking CBD (cannabidiol) and, more important, all patients taking  opioid analgesic controlled substances (pain medication). (Example: oxycodone; oxymorphone; hydrocodone; hydromorphone; morphine; methadone; tramadol; tapentadol; fentanyl; buprenorphine; butorphanol; dextromethorphan; meperidine; codeine; etc.)  Legal status: CBD remains a Schedule I drug prohibited for any use. CBD is illegal with one exception. In the [place], CBD has a limited Transport planner (FDA) approval for the treatment of two specific types of epilepsy disorders. Only one CBD product has been approved by the FDA for this purpose: "Epidiolex". FDA is aware that some companies are marketing products containing cannabis and cannabis-derived compounds in ways that violate the [employer], Drug and Cosmetic Act [facility] Act) and that may put the health and safety of consumers at risk. The FDA, a Federal agency, has not enforced the CBD status since 2018.   Legality: Some manufacturers ship CBD products nationally, which is illegal. Often such products are sold online and are therefore available throughout the country. CBD is openly sold in head shops and health food stores in some states where such sales have not been explicitly legalized. Selling unapproved products with unsubstantiated therapeutic claims is not only a violation of the law, but also can put patients at risk, as these products have not been proven to be safe or effective. Federal illegality makes it difficult to conduct research on CBD.  Reference: "FDA Regulation of Cannabis and Cannabis-Derived Products, Including Cannabidiol (CBD)" - [employer].pt  Warning: CBD is not FDA approved and has not undergo the same manufacturing controls as prescription drugs.  This means that the purity and safety of available CBD may be questionable. Most of the time, despite manufacturer's claims, it is contaminated with  THC (delta-9-tetrahydrocannabinol - the chemical in marijuana responsible for the "HIGH").  When this is the case, the [facility] contaminant will trigger a positive urine drug screen (UDS) test for Marijuana (carboxy-THC). Because a positive UDS for any illicit substance is a violation of our medication agreement, your opioid analgesics (pain medicine) may be permanently discontinued. The FDA recently put out a warning about 5 things that everyone should be aware of regarding Delta-8 THC: Delta-8 THC products have not been evaluated or approved by the FDA for safe use and may be marketed in ways that put the public health at risk. The FDA has received adverse event reports involving delta-8 THC-containing products. Delta-8 THC has psychoactive and intoxicating effects. Delta-8 THC manufacturing often involve use of potentially harmful chemicals to create the concentrations of delta-8 THC claimed in the marketplace. The final delta-8 THC product may have potentially harmful by-products (contaminants) due to the chemicals used in the process. Manufacturing of delta-8 THC products may occur in uncontrolled or unsanitary settings, which may lead to the presence of unsafe contaminants or other potentially harmful substances. Delta-8 THC products should be kept out of the reach of children and pets.  MORE ABOUT CBD  General Information: CBD was discovered in [birth year] and it is a derivative of the cannabis sativa genus plants (Marijuana and Hemp). It is one of the 113 identified substances found in Marijuana. It accounts for up to 40% of the plant's extract. As of 2018, preliminary clinical studies on CBD included research for the treatment of anxiety, movement disorders, and pain. CBD is available and consumed in multiple forms, including inhalation of smoke or vapor, as an aerosol spray, and by mouth. It may be supplied as an oil containing  CBD, capsules, dried cannabis, or as a liquid solution. CBD is thought not to be  as psychoactive as THC (delta-9-tetrahydrocannabinol - the chemical in marijuana responsible for the "HIGH"). Studies suggest that CBD may interact with different biological target receptors in the body, including cannabinoid and other neurotransmitter receptors. As of 2018 the mechanism of action for its biological effects has not been determined.  Side-effects   Adverse reactions: Dry mouth, diarrhea, decreased appetite, fatigue, drowsiness, malaise, weakness, sleep disturbances, and others.  Drug interactions: CBC may interact with other medications such as blood-thinners. (Last update: 08/04/2021) ____________________________________________________________________________________________

## 2022-01-09 LAB — TOXASSURE SELECT 13 (MW), URINE

## 2022-01-16 ENCOUNTER — Encounter: Payer: Self-pay | Admitting: Pain Medicine

## 2022-01-16 ENCOUNTER — Other Ambulatory Visit: Payer: Self-pay

## 2022-01-16 ENCOUNTER — Ambulatory Visit: Payer: Medicaid Other | Attending: Pain Medicine | Admitting: Pain Medicine

## 2022-01-16 VITALS — BP 126/68 | HR 74 | Temp 97.1°F | Resp 16 | Ht 61.0 in | Wt 230.0 lb

## 2022-01-16 DIAGNOSIS — M533 Sacrococcygeal disorders, not elsewhere classified: Secondary | ICD-10-CM | POA: Diagnosis present

## 2022-01-16 DIAGNOSIS — M25562 Pain in left knee: Secondary | ICD-10-CM | POA: Diagnosis present

## 2022-01-16 DIAGNOSIS — M25551 Pain in right hip: Secondary | ICD-10-CM | POA: Insufficient documentation

## 2022-01-16 DIAGNOSIS — M25561 Pain in right knee: Secondary | ICD-10-CM | POA: Diagnosis present

## 2022-01-16 DIAGNOSIS — M5442 Lumbago with sciatica, left side: Secondary | ICD-10-CM | POA: Diagnosis present

## 2022-01-16 DIAGNOSIS — M79604 Pain in right leg: Secondary | ICD-10-CM | POA: Diagnosis present

## 2022-01-16 DIAGNOSIS — G35 Multiple sclerosis: Secondary | ICD-10-CM | POA: Diagnosis present

## 2022-01-16 DIAGNOSIS — G894 Chronic pain syndrome: Secondary | ICD-10-CM | POA: Diagnosis present

## 2022-01-16 DIAGNOSIS — M5441 Lumbago with sciatica, right side: Secondary | ICD-10-CM

## 2022-01-16 DIAGNOSIS — Z79891 Long term (current) use of opiate analgesic: Secondary | ICD-10-CM | POA: Insufficient documentation

## 2022-01-16 DIAGNOSIS — F419 Anxiety disorder, unspecified: Secondary | ICD-10-CM | POA: Insufficient documentation

## 2022-01-16 DIAGNOSIS — G8929 Other chronic pain: Secondary | ICD-10-CM | POA: Insufficient documentation

## 2022-01-16 DIAGNOSIS — M25552 Pain in left hip: Secondary | ICD-10-CM

## 2022-01-16 DIAGNOSIS — M47816 Spondylosis without myelopathy or radiculopathy, lumbar region: Secondary | ICD-10-CM | POA: Diagnosis present

## 2022-01-16 DIAGNOSIS — Z79899 Other long term (current) drug therapy: Secondary | ICD-10-CM | POA: Diagnosis present

## 2022-01-16 DIAGNOSIS — M79605 Pain in left leg: Secondary | ICD-10-CM | POA: Diagnosis present

## 2022-01-16 DIAGNOSIS — M5137 Other intervertebral disc degeneration, lumbosacral region: Secondary | ICD-10-CM | POA: Diagnosis present

## 2022-01-16 MED ORDER — OXYCODONE HCL 5 MG PO TABS: 5.0000 mg | ORAL_TABLET | Freq: Four times a day (QID) | ORAL | 0 refills | Status: DC | PRN

## 2022-01-16 MED ORDER — DIAZEPAM 10 MG PO TABS: 10.0000 mg | ORAL_TABLET | ORAL | 0 refills | Status: DC

## 2022-01-16 NOTE — Progress Notes (Signed)
Nursing Pain Medication Assessment:  ?Safety precautions to be maintained throughout the outpatient stay will include: orient to surroundings, keep bed in low position, maintain call bell within reach at all times, provide assistance with transfer out of bed and ambulation.  ?Medication Inspection Compliance: Pill count conducted under aseptic conditions, in front of the patient. Neither the pills nor the bottle was removed from the patient's sight at any time. Once count was completed pills were immediately returned to the patient in their original bottle. ? ?Medication: Oxycodone IR ?Pill/Patch Count:  65 of 120 pills remain ?Pill/Patch Appearance: Markings consistent with prescribed medication ?Bottle Appearance: Standard pharmacy container. Clearly labeled. ?Filled Date: 2 / 33 / 2023 ?Last Medication intake:  Today ? ?After counting patients Oxycodone., while handing medication back I dropped medication, cap fell off and 58 pills fell in floor. Witness by DW and helped find 58 pills in room. Patient not upset and wanted to keep medication. Instructed that we needed to take over to pharm and wasted. New prescription was sent to pharm.  And pharm called and notified. Patient did have 7 pills in bottle that did not spill. 58+7 =65 pills to be wasted . Will call pharm and let know that incident. ? ?

## 2022-01-16 NOTE — Progress Notes (Signed)
PROVIDER NOTE: Information contained herein reflects review and annotations entered in association with encounter. Interpretation of such information and data should be left to medically-trained personnel. Information provided to patient can be located elsewhere in the medical record under "Patient Instructions". Document created using STT-dictation technology, any transcriptional errors that may result from process are unintentional.  ?  ?Patient: Shannon Benitez  Service Category: E/M  Provider: [name], MD  ?DOB: 04-May-1971  DOS: 01/16/2022  Specialty: Interventional Pain Management  ?MRN: [id number]  Setting: Ambulatory outpatient  PCP: Danae Orleans, MD  ?Type: Established Patient    Referring Provider: Danae Orleans, MD  ?Location: Office  Delivery: Face-to-face    ? ?HPI  ?[name], a 51 y.o. year old female, is here today because of her Chronic bilateral low back pain with bilateral sciatica [M54.42, M54.41, G89.29]. [name] primary complain today is Back Pain (lower) ?Last encounter: My last encounter with her was on 01/02/2022. ?Pertinent problems: Shannon Benitez has Multiple sclerosis ([place]); Neurogenic pain; Chronic sacroiliac joint pain (Bilateral) (L>R); Chronic pain syndrome; Chronic low back pain (1ry area of Pain) (Bilateral) (L>R) w/ sciatica (Bilateral); Chronic lower extremity pain (2ry area of Pain) (Bilateral) (R>L); Chronic low back pain (Bilateral) (L>R) w/o sciatica; Muscle spasticity; Spondylosis without myelopathy or radiculopathy, lumbosacral region; Lumbar facet syndrome (Bilateral) (R>L); Abnormal MRI, lumbar spine (06/16/2018); Lumbar facet hypertrophy (Multilevel) (Bilateral); Osteoarthritis of facet joint of lumbar spine; Osteoarthritis of lumbar spine; DDD (degenerative disc disease), lumbosacral; Lumbar spondylosis; Diabetic peripheral neuropathy ([place]); 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 ?Pain Assessment: Severity of Chronic pain is reported as a 10-Worst pain ever/10. Location: Back Lower/both legs to the knees. Onset:  . Quality: Aching, Burning, Stabbing, Throbbing. Timing: Intermittent. Modifying factor(s): rest, sitting, medications. ?Vitals:  height is '5\' 1"'$  (1.549 m) and weight is 230 lb (104.3 kg). Her temporal temperature is 97.1 ?F (36.2 ?C) (abnormal). Her blood pressure is 126/68 and her pulse is 74. Her respiration is 16 and oxygen saturation is 98%.  ? ?Reason for encounter: both, medication management and post-procedure evaluation and assessment.   The patient indicates doing well with the current medication regimen. No adverse reactions or side effects reported to the medications.  Unfortunately, as the nurse was counting the patient's pills today, she had an accident and all the pills fell to the floor.  Because it was our fault, we called the pharmacy and explained the situation.  We have disposed of all of those pills and from the patient and we have resent the 2 other prescriptions that she had on file, but with the second 1 to be filled today. ? ?One 01/02/2022 increased the patient's dose from 5 mg p.o. 3 times daily to 4 times daily.  I am seeing a pattern where she for started with oxycodone IR 5 mg twice daily and she is now up to 4 times daily.  On 01/02/2022 I ordered an x-ray of the lumbar spine with flexion and extension views to review the current status of her lumbar spine.   Those x-rays indicate no acute injuries but they again confirmed  degenerative changes of the facet joints, more so at the L5-S1 level.  I also ordered a UDS that came back within normal limits.  I was standing on that 01/02/2022 visit was the fact that the patient had gained 30 pounds since her last visit.  At that time we had a very serious conversation regarding this and the fact that she needs to bring her BMI down to 30.  Her target weight is 160 pounds and she is currently 70 pounds over that goal.  Currently the patient's BMI is 43.63 kg/m? and she has a weight of 230 pounds at 5 feet 1 inch tall. ? ?Today I had a long conversation with the patient regarding her weight and how she needs to bring it down to a BMI of 30 so that we can go ahead and do a radiofrequency ablation and provide her with longer lasting benefit.  Meanwhile, since she did have good relief of the pain with a bilateral lumbar facet blocks until she fell and reinjured her back, we have decided to go ahead and schedule her for a repeat bilateral lumbar facet block. ? ?The long-term plan is to get this pain under control so that we can again bring down her medication use to a much lower level.  In addition, we also need to have the patient work on bringing her weight down so that we can move onto doing a radiofrequency ablation and perhaps provide her with long-term benefit where we can discontinue her pain medication for a while.  In any case, it is evident that we will probably need to do a "Drug Holiday" in the near future.  If we can have her lose 70 pounds, it is very likely that this by itself will provide her with substantial decrease in her pain. ? ?RTCB: 04/02/2022 ? ?Post-procedure evaluation  ?  Type: Lumbar Facet, Medial Branch Block(s) #5  ?Laterality: Bilateral  ?Level: L2, L3, L4, L5, & S1 Medial Branch Level(s). Injecting these levels blocks the L3-4 and L5-S1 lumbar facet joints. ?Imaging: Fluoroscopic guidance ?Anesthesia:  Local anesthesia (1-2% Lidocaine) ?Anxiolysis: Oral Valium 10 mg ?Sedation: None. ?DOS: 11/28/2021 ?Performed by: [name], MD ? ?Primary Purpose: Diagnostic/Therapeutic ?Indications: Low back pain severe enough to impact quality of life or function. ?1. Lumbar facet syndrome (Bilateral) (R>L)   ?2. Lumbar facet hypertrophy (Multilevel) (Bilateral)   ?3. Spondylosis without myelopathy or radiculopathy, lumbosacral region   ?4. Chronic low back pain (Bilateral) (L>R) w/o sciatica   ?5. DDD (degenerative disc disease), lumbosacral   ?6. Lumbar spondylosis   ?7. Abnormal MRI, lumbar spine (06/16/2018)   ? ?NAS-11 Pain score:  ? Pre-procedure: 10-Worst pain ever/10  ? Post-procedure: 0-No pain/10  ? ?   ?Effectiveness:  ?Initial hour after procedure: 100 %. ?Subsequent 4-6 hours post-procedure: 95 %. ?Analgesia past initial 6 hours: 95 %. ?Ongoing improvement:  ?Analgesic: The patient refers having attained excellent relief of the pain with the procedure.  Unfortunately, she had a severe fall and the pain has returned. ?Function: Back to baseline ?ROM: Back to baseline ? ?Pharmacotherapy Assessment  ?Analgesic: Oxycodone IR 5 mg, 1 tab PO QID from an initial BID (10 mg/day of oxycodone) (now at 20 mg/day) ?MME/day: 30 mg/day.  ? ?Monitoring: ?[place] PMP: PDMP reviewed during this encounter.       ?Pharmacotherapy: No side-effects or adverse reactions reported. ?Compliance: No problems identified. ?Effectiveness: Clinically acceptable. ? ?Ignatius Specking, RN  01/16/2022  2:40 PM  Sign when Signing Visit ?  Nursing Pain Medication Assessment:  ?Safety precautions to be maintained throughout the outpatient stay will include: orient to surroundings, keep bed in low position, maintain call bell within reach at all times, provide assistance with transfer out of bed and ambulation.  ?Medication Inspection Compliance: Pill count conducted under aseptic conditions, in front of the patient. Neither the pills nor the bottle  was removed from the patient's sight at any time. Once count was completed pills were immediately returned to the patient in their original bottle. ? ?Medication: Oxycodone IR ?Pill/Patch Count:  65 of 120 pil

## 2022-01-16 NOTE — Patient Instructions (Addendum)
?______________________________________________________________________________________________ ? ?Body mass index (BMI) ? ?Body mass index (BMI) is a common tool for deciding whether a person has an appropriate body weight.  It measures a persons weight in relation to their height.   ?According to the [employer] of health (NIH): ?A BMI of less than 18.5 means that a person is underweight. ?A BMI of between 18.5 and 24.9 is ideal. ?A BMI of between 25 and 29.9 is overweight. ?A BMI over 30 indicates obesity. ? ?Weight Management Required ? ?URGENT: Your weight has been found to be adversely affecting your health. ? ?Dear Shannon Benitez: ? ?Your current Estimated body mass index is 43.63 kg/m? as calculated from the following: ?  Height as of 01/02/22: _0  (1.549 m). ?  Weight as of 01/02/22: 230 lb 14.4 oz (104.7 kg). ? ?Please use the table below to identify your weight category and associated incidence of chronic pain, secondary to your weight. ? ?Body Mass Index (BMI) Classification ?BMI level (kg/m2) Category Associated incidence of chronic pain  ?<18  Underweight   ?18.5-24.9 Ideal body weight   ?25-29.9 Overweight  20%  ?30-34.9 Obese (Class I)  68%  ?35-39.9 Severe obesity (Class II)  136%  ?>40 Extreme obesity (Class III)  254%  ? ?In addition: You will be considered "Morbidly Obese", if your BMI is above 30 and you have one or more of the following conditions which are known to be caused and/or directly associated with obesity: ?1.    Type 2 Diabetes (Which in turn can lead to cardiovascular diseases (CVD), stroke, peripheral vascular diseases (PVD), retinopathy, nephropathy, and neuropathy) ?2.    Cardiovascular Disease (High Blood Pressure; Congestive Heart Failure; High Cholesterol; Coronary Artery Disease; Angina; or History of Heart Attacks) ?3.    Breathing problems (Asthma; obesity-hypoventilation syndrome; obstructive sleep apnea; chronic inflammatory airway disease; reactive airway  disease; or shortness of breath) ?4.    Chronic kidney disease ?5.    Liver disease (nonalcoholic fatty liver disease) ?6.    High blood pressure ?7.    Acid reflux (gastroesophageal reflux disease; heartburn) ?8.    Osteoarthritis (OA) (with any of the following: hip pain; knee pain; and/or low back pain) ?9.    Low back pain (Lumbar Facet Syndrome; and/or Degenerative Disc Disease) ?10.  Hip pain (Osteoarthritis of hip) (For every 1 lbs of added body weight, there is a 2 lbs increase in pressure inside of each hip articulation. 1:2 mechanical relationship) ?11.  Knee pain (Osteoarthritis of knee) (For every 1 lbs of added body weight, there is a 4 lbs increase in pressure inside of each knee articulation. 1:4 mechanical relationship) (patients with a BMI>30 kg/m2 were 6.8 times more likely to develop knee OA than normal-weight individuals) ?12.  Cancer: Epidemiological studies have shown that obesity is a risk factor for: post-menopausal breast cancer; cancers of the endometrium, colon and kidney cancer; malignant adenomas of the oesophagus. Obese subjects have an approximately 1.5-3.5-fold increased risk of developing these cancers compared with normal-weight subjects, and it has been estimated that between 15 and 45% of these cancers can be attributed to overweight. More recent studies suggest that obesity may also increase the risk of other types of cancer, including pancreatic, hepatic and gallbladder cancer. (Ref: Obesity and cancer. Pischon T, N?thlings U, [employer] H. Proc Nutr Soc. 2008 May;67(2):128-45. doi: 09.4709/G2836629476546503.) The International Agency for Research on Cancer (IARC) has identified 13 cancers associated with overweight and obesity: meningioma, multiple myeloma, adenocarcinoma of the esophagus, and cancers of  the thyroid, postmenopausal breast cancer, gallbladder, stomach, liver, pancreas, kidney, ovaries, uterus, colon and rectal (colorectal) cancers. 48 percent of all cancers  diagnosed in women and 24 percent of those diagnosed in men are associated with overweight and obesity. ? ?Recommendation: At this point it is urgent that you take a step back and concentrate in loosing weight. Dedicate 100% of your efforts on this task. Nothing else will improve your health more than bringing your weight down and your BMI to less than 30. If you are here, you probably have chronic pain. We know that most chronic pain patients have difficulty exercising secondary to their pain. For this reason, you must rely on proper nutrition and diet in order to lose the weight. If your BMI is above 40, you should seriously consider bariatric surgery. A realistic goal is to lose 10% of your body weight over a period of 12 months.  Be honest to yourself, if over time you have unsuccessfully tried to lose weight, then it is time for you to seek professional help and to enter a medically supervised weight management program, and/or undergo bariatric surgery. Stop procrastinating.  ? ?Pain management considerations:  ?1.    Pharmacological Problems: Be advised that the use of opioid analgesics (oxycodone; hydrocodone; morphine; methadone; codeine; and all of their derivatives) have been associated with decreased metabolism and weight gain.  For this reason, should we see that you are unable to lose weight while taking these medications, it may become necessary for [place] to taper down and indefinitely discontinue them.  ?2.    Technical Problems: The incidence of successful interventional therapies decreases as the patient's BMI increases. It is much more difficult to accomplish a safe and effective interventional therapy on a patient with a BMI above 35. ?3.    Radiation Exposure Problems: The x-rays machine, used to accomplish injection therapies, will automatically increase their x-ray output in order to capture an appropriate bone image. This means that radiation exposure increases exponentially with the patient's  BMI. (The higher the BMI, the higher the radiation exposure.) Although the level of radiation used at a given time is still safe to the patient, it is not for the physician and/or assisting staff. Unfortunately, radiation exposure is accumulative. Because physicians and the staff have to do procedures and be exposed on a daily basis, this can result in health problems such as cancer and radiation burns. Radiation exposure to the staff is monitored by the radiation batches that they wear. The exposure levels are reported back to the staff on a quarterly basis. Depending on levels of exposure, physicians and staff may be obligated by law to decrease this exposure. This means that they have the right and obligation to refuse providing therapies where they may be overexposed to radiation. For this reason, physicians may decline to offer therapies such as radiofrequency ablation or implants to patients with a BMI above 40. ?4.    Current Trends: Be advised that the current trend is to no longer offer certain therapies to patients with a BMI equal to, or above 35, due to increase perioperative risks, increased technical procedural difficulties, and excessive radiation exposure to healthcare personnel. ? ?______________________________________________________________________________________________ ? ?____________________________________________________________________________________________ ? ?Drug Holidays (Slow) ? ?What is a "Drug Holiday"? ?Drug Holiday: is the name given to the period of time during which a patient stops taking a medication(s) for the purpose of eliminating tolerance to the drug. ? ?Benefits ?Improved effectiveness of opioids. ?Decreased opioid dose needed to achieve benefits. ?Improved  pain with lesser dose. ? ?What is tolerance? ?Tolerance: is the progressive decreased in effectiveness of a drug due to its repetitive use. With repetitive use, the body gets use to the medication and as a  consequence, it loses its effectiveness. This is a common problem seen with opioid pain medications. As a result, a larger dose of the drug is needed to achieve the same effect that used to be obtained with a smaller dose. ? ?H

## 2022-01-17 ENCOUNTER — Telehealth: Payer: Self-pay | Admitting: Pain Medicine

## 2022-01-17 NOTE — Telephone Encounter (Signed)
Called patient and informed that the medication bill will be taken care of this afternoon and she could go pick them up. Patient with understanding. ?

## 2022-01-17 NOTE — Telephone Encounter (Signed)
Called [facility] to get correct price of medication $59.82. Asked them if they had a program to have with this, they did not.  ?

## 2022-01-17 NOTE — Telephone Encounter (Signed)
Called and talked with patient, she states that when she went to the pharmacy yesterday that they were going to charge her the full price since it hasnt been 30 days. The full price will be $60.00 and can not afford them. Manager out of office today will call supervisor. ?

## 2022-01-17 NOTE — Telephone Encounter (Signed)
Patient is having problem getting medication that has to be replaced. Insurance will not pay and they are asking full price for medications. She does not have the money to pick up medications. Please advise patient ?

## 2022-01-30 ENCOUNTER — Ambulatory Visit
Admission: RE | Admit: 2022-01-30 | Discharge: 2022-01-30 | Disposition: A | Discharge status: Home / Self Care | Payer: Medicaid Other | Source: Ambulatory Visit | Attending: Pain Medicine | Admitting: Pain Medicine

## 2022-01-30 ENCOUNTER — Ambulatory Visit (HOSPITAL_BASED_OUTPATIENT_CLINIC_OR_DEPARTMENT_OTHER): Payer: Medicaid Other | Admitting: Pain Medicine

## 2022-01-30 ENCOUNTER — Encounter: Payer: Self-pay | Admitting: Pain Medicine

## 2022-01-30 ENCOUNTER — Other Ambulatory Visit: Payer: Self-pay

## 2022-01-30 VITALS — BP 111/53 | HR 80 | Temp 97.2°F | Resp 15 | Ht 61.0 in | Wt 230.0 lb

## 2022-01-30 DIAGNOSIS — M5137 Other intervertebral disc degeneration, lumbosacral region: Secondary | ICD-10-CM

## 2022-01-30 DIAGNOSIS — M47817 Spondylosis without myelopathy or radiculopathy, lumbosacral region: Secondary | ICD-10-CM | POA: Insufficient documentation

## 2022-01-30 DIAGNOSIS — R937 Abnormal findings on diagnostic imaging of other parts of musculoskeletal system: Secondary | ICD-10-CM | POA: Diagnosis present

## 2022-01-30 DIAGNOSIS — M545 Low back pain, unspecified: Secondary | ICD-10-CM

## 2022-01-30 DIAGNOSIS — M47816 Spondylosis without myelopathy or radiculopathy, lumbar region: Secondary | ICD-10-CM | POA: Insufficient documentation

## 2022-01-30 DIAGNOSIS — G8929 Other chronic pain: Secondary | ICD-10-CM | POA: Diagnosis present

## 2022-01-30 MED ORDER — TRIAMCINOLONE ACETONIDE 40 MG/ML IJ SUSP
80.0000 mg | Freq: Once | INTRAMUSCULAR | Status: AC
  Administered 2022-01-30: 80 mg
  Filled 2022-01-30: qty 2

## 2022-01-30 MED ORDER — PENTAFLUOROPROP-TETRAFLUOROETH EX AERO: INHALATION_SPRAY | Freq: Once | CUTANEOUS | Status: DC

## 2022-01-30 MED ORDER — ROPIVACAINE HCL 2 MG/ML IJ SOLN
18.0000 mL | Freq: Once | INTRAMUSCULAR | Status: AC
  Administered 2022-01-30: 18 mL via PERINEURAL
  Filled 2022-01-30: qty 20

## 2022-01-30 MED ORDER — LIDOCAINE HCL 2 % IJ SOLN
20.0000 mL | Freq: Once | INTRAMUSCULAR | Status: AC
  Administered 2022-01-30: 400 mg
  Filled 2022-01-30: qty 20

## 2022-01-30 NOTE — Patient Instructions (Signed)

## 2022-01-30 NOTE — Progress Notes (Signed)
PROVIDER NOTE: Interpretation of information contained herein should be left to medically-trained personnel. Specific patient instructions are provided elsewhere under "Patient Instructions" section of medical record. This document was created in part using STT-dictation technology, any transcriptional errors that may result from this process are unintentional.  ?Patient: Shannon Benitez ?Type: Established ?DOB: January 30, 1971 ?MRN: [id number] ?PCP: Danae Orleans, MD  Service: Procedure ?DOS: 01/30/2022 ?Setting: Ambulatory ?Location: Ambulatory outpatient facility ?Delivery: Face-to-face Provider: [name], MD ?Specialty: Interventional Pain Management ?Specialty designation: 09 ?Location: Outpatient facility ?Ref. Prov.: Danae Orleans, MD   ? ?Primary Reason for Visit: Interventional Pain Management Treatment. ?CC: Back Pain (lower) ?  ?Procedure:          ? Type: Lumbar Facet, Medial Branch Block(s)  #6 since 2020  (#1 of 2023) ?Laterality: Bilateral  ?Level: L2, L3, L4, L5, & S1 Medial Branch Level(s). Injecting these levels blocks the L3-4 and L5-S1 lumbar facet joints. ?Imaging: Fluoroscopic guidance ?Anesthesia: Local anesthesia (1-2% Lidocaine) ?Anxiolysis: Oral Valium 5 mg ?Sedation: None. ?DOS: 01/30/2022 ?Performed by: [name], MD ? ?Primary Purpose: Therapeutic ?Indications: Low back pain severe enough to impact quality of life or function. ?1. Lumbar facet syndrome (Bilateral) (R>L)   ?2. Spondylosis without myelopathy or radiculopathy, lumbosacral region   ?3. Chronic low back pain (Bilateral) (L>R) w/o sciatica   ?4. Lumbar facet hypertrophy (Multilevel) (Bilateral)   ?5. DDD (degenerative disc disease), lumbosacral   ?6. Osteoarthritis of facet joint of lumbar spine   ?7. Abnormal MRI, lumbar spine (06/16/2018)   ?8. Obesity, Class III, BMI 40-49.9 (morbid obesity) ([place])   ? ?NAS-11 Pain score:  ? Pre-procedure: 10-Worst pain ever/10  ? Post-procedure: 0-No pain/10  ? ?   ?Position / Prep / Materials:  ?Position: Prone  ?Prep solution: DuraPrep (Iodine Povacrylex [0.7% available iodine] and Isopropyl Alcohol, 74% w/w) ?Area Prepped: Posterolateral Lumbosacral Spine (Wide prep: From the lower border of the scapula down to the end of the tailbone and from flank to flank.) ? ?Materials:  ?Tray: Block ?Needle(s):  ?Type: Spinal  ?Gauge (G): 22  ?Length: 5-in ?Qty: 4 ? ?Pre-op H&P Assessment:  ?Shannon Benitez is a 51 y.o. (year old), female patient, seen today for interventional treatment. She  has a past surgical history that includes Abdominal hysterectomy; Cesarean section; Tumor removal (Left); Cataract extraction w/PHACO (Right, 03/18/2019); and Cataract extraction w/PHACO (Left, 04/13/2019). Shannon Benitez has a current medication list which includes the following prescription(s): aspirin-acetaminophen-caffeine, diazepam, dimethyl fumarate, duloxetine, lamotrigine, loperamide, mirabegron er, naloxone, oxycodone, [START ON 02/15/2022] oxycodone, propranolol, sertraline, baclofen, levetiracetam, meloxicam, metformin, omeprazole, pregabalin, primidone, and sitagliptin, and the following Facility-Administered Medications: pentafluoroprop-tetrafluoroeth. Her primarily concern today is the Back Pain (lower) ? ?Initial Vital Signs:  ?Pulse/HCG Rate: 80ECG Heart Rate: 75 ?Temp: (!) 97.2 ?F (36.2 ?C) ?Resp: 16 ?BP: 104/76 ?SpO2: 93 % ? ?BMI: Estimated body mass index is 43.46 kg/m? as calculated from the following: ?  Height as of this encounter: '5\' 1"'$  (1.549 m). ?  Weight as of this encounter: 230 lb (104.3 kg). ? ?Risk Assessment: ?Allergies: Reviewed. She is allergic to 5-alpha reductase inhibitors, desvenlafaxine, glatiramer, rebif [interferon beta-1a], and latex.  ?Allergy Precautions: None required ?Coagulopathies: Reviewed. None identified.  ?Blood-thinner therapy: None at this time ?Active Infection(s): Reviewed. None identified. Shannon Benitez is afebrile ? ?Site Confirmation: Ms.  Shannon Benitez was asked to confirm the procedure and laterality before marking the site ?Procedure checklist: Completed ?Consent: Before the procedure and under the influence of no sedative(s), amnesic(s), or anxiolytics, the patient  was informed of the treatment options, risks and possible complications. To fulfill our ethical and legal obligations, as recommended by the American Medical Association's Code of Ethics, I have informed the patient of my clinical impression; the nature and purpose of the treatment or procedure; the risks, benefits, and possible complications of the intervention; the alternatives, including doing nothing; the risk(s) and benefit(s) of the alternative treatment(s) or procedure(s); and the risk(s) and benefit(s) of doing nothing. ?The patient was provided information about the general risks and possible complications associated with the procedure. These may include, but are not limited to: failure to achieve desired goals, infection, bleeding, organ or nerve damage, allergic reactions, paralysis, and death. ?In addition, the patient was informed of those risks and complications associated to Spine-related procedures, such as failure to decrease pain; infection (i.e.: Meningitis, epidural or intraspinal abscess); bleeding (i.e.: epidural hematoma, subarachnoid hemorrhage, or any other type of intraspinal or peri-dural bleeding); organ or nerve damage (i.e.: Any type of peripheral nerve, nerve root, or spinal cord injury) with subsequent damage to sensory, motor, and/or autonomic systems, resulting in permanent pain, numbness, and/or weakness of one or several areas of the body; allergic reactions; (i.e.: anaphylactic reaction); and/or death. ?Furthermore, the patient was informed of those risks and complications associated with the medications. These include, but are not limited to: allergic reactions (i.e.: anaphylactic or anaphylactoid reaction(s)); adrenal axis suppression; blood sugar  elevation that in diabetics may result in ketoacidosis or comma; water retention that in patients with history of congestive heart failure may result in shortness of breath, pulmonary edema, and decompensation with resultant heart failure; weight gain; swelling or edema; medication-induced neural toxicity; particulate matter embolism and blood vessel occlusion with resultant organ, and/or nervous system infarction; and/or aseptic necrosis of one or more joints. ?Finally, the patient was informed that Medicine is not an exact science; therefore, there is also the possibility of unforeseen or unpredictable risks and/or possible complications that may result in a catastrophic outcome. The patient indicated having understood very clearly. We have given the patient no guarantees and we have made no promises. Enough time was given to the patient to ask questions, all of which were answered to the patient's satisfaction. Shannon Benitez has indicated that she wanted to continue with the procedure. ?Attestation: I, the ordering provider, attest that I have discussed with the patient the benefits, risks, side-effects, alternatives, likelihood of achieving goals, and potential problems during recovery for the procedure that I have provided informed consent. ?Date  Time: 01/30/2022 10:09 AM ? ?Pre-Procedure Preparation:  ?Monitoring: As per clinic protocol. Respiration, ETCO2, SpO2, BP, heart rate and rhythm monitor placed and checked for adequate function ?Safety Precautions: Patient was assessed for positional comfort and pressure points before starting the procedure. ?Time-out: I initiated and conducted the "Time-out" before starting the procedure, as per protocol. The patient was asked to participate by confirming the accuracy of the "Time Out" information. Verification of the correct person, site, and procedure were performed and confirmed by me, the nursing staff, and the patient. "Time-out" conducted as per Joint  Commission's Universal Protocol (UP.01.01.01). ?Time: 5621 ? ?Description of Procedure:          ?Laterality: Bilateral. The procedure was performed in identical fashion on both sides. ?Targeted Levels:  L2, L3, L4, L5

## 2022-01-31 ENCOUNTER — Telehealth: Payer: Self-pay

## 2022-01-31 NOTE — Telephone Encounter (Signed)
Post procedure follow up call.  Patient states she has a place the size of a grapefruit.  Denies fever.  Instructed patient to come in and let us look at back.  She then states she just looked at it again and it is better. Instructed patient if this returned to come in to the office so that we may assess it.  Dr Dossie Arbour notified.   ?

## 2022-02-13 ENCOUNTER — Ambulatory Visit (HOSPITAL_BASED_OUTPATIENT_CLINIC_OR_DEPARTMENT_OTHER): Payer: Medicaid Other | Admitting: Pain Medicine

## 2022-02-13 DIAGNOSIS — Z91199 Patient's noncompliance with other medical treatment and regimen due to unspecified reason: Secondary | ICD-10-CM

## 2022-02-13 NOTE — Progress Notes (Deleted)
No show to appointment.

## 2022-03-08 DIAGNOSIS — M797 Fibromyalgia: Secondary | ICD-10-CM | POA: Insufficient documentation

## 2022-03-08 NOTE — Progress Notes (Signed)
This patient cancelled, rescheduled, or was a "NO SHOW" to this appointment. ?

## 2022-03-11 NOTE — Progress Notes (Signed)
PROVIDER NOTE: Information contained herein reflects review and annotations entered in association with encounter. Interpretation of such information and data should be left to medically-trained personnel. Information provided to patient can be located elsewhere in the medical record under "Patient Instructions". Document created using STT-dictation technology, any transcriptional errors that may result from process are unintentional.  ?  ?Patient: Shannon Benitez  Service Category: E/M  Provider: [name], MD  ?DOB: Feb 09, 1971  DOS: 03/12/2022  Specialty: Interventional Pain Management  ?MRN: [id number]  Setting: Ambulatory outpatient  PCP: Shannon Orleans, MD  ?Type: Established Patient    Referring Provider: Danae Orleans, MD  ?Location: Office  Delivery: Face-to-face    ? ?HPI  ?[name], a 51 y.o. year old female, is here today because of her Chronic pain syndrome [G89.4]. [name] primary complain today is Back Pain (Lumbar bilateral ) ?Last encounter: My last encounter with her was on 02/13/2022. ?Pertinent problems: [name] has Multiple sclerosis ([place]); Neurogenic pain; Chronic sacroiliac joint pain (Bilateral) (L>R); Chronic pain syndrome; Chronic low back pain (1ry area of Pain) (Bilateral) (L>R) w/ sciatica (Bilateral); Chronic lower extremity pain (2ry area of Pain) (Bilateral) (R>L); Chronic low back pain (Bilateral) (L>R) w/o sciatica; Muscle spasticity; Spondylosis without myelopathy or radiculopathy, lumbosacral region; Lumbar facet syndrome (Bilateral) (R>L); Abnormal MRI, lumbar spine (06/16/2018); Lumbar facet hypertrophy (Multilevel) (Bilateral); Osteoarthritis of facet joint of lumbar spine; Osteoarthritis of lumbar spine; DDD (degenerative disc disease), lumbosacral; Lumbar spondylosis; Diabetic peripheral neuropathy ([place]); 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 ?Pain Assessment: Severity of Chronic pain is reported as a 9 /10. Location: Back Lower, Left, Right, Mid/into hips and legs. Onset: More than a month ago. Quality: Discomfort, Constant, Throbbing, Aching, Shooting. Timing: Constant. Modifying factor(s): medications help when taking as prescribed. ?Vitals:  height is '5\' 1"'$  (1.549 m) and weight is 230 lb (104.3 kg). Her temporal temperature is 97.3 ?F (36.3 ?C) (abnormal). Her blood pressure is 97/70 and her pulse is 80. Her respiration is 16 and oxygen saturation is 100%.  ? ?Reason for encounter: medication management.  The patient indicates doing well with the current medication regimen. No adverse reactions or side effects reported to the medications.   ? ?RTCB: 06/15/2022 ?Nonopioids transfer 08/10/2020: Mobic, baclofen, and Lyrica. ? ?Pharmacotherapy Assessment  ?Analgesic: Oxycodone IR 5 mg, 1 tab PO QID from an initial BID (10 mg/day of oxycodone) (now at 20 mg/day) ?MME/day: 30 mg/day.  ? ?Monitoring: ?[place] PMP: PDMP reviewed during this encounter.       ?Pharmacotherapy: No side-effects or adverse reactions reported. ?Compliance: No problems identified. ?Effectiveness: Clinically acceptable. ? ?No notes on file  UDS:  ?Summary  ?Date Value Ref Range Status  ?01/03/2022 Note  Final  ?  Comment:  ?  ==================================================================== ?[employer] Select 13 (MW) ?==================================================================== ?Test                             Result       Flag        Units ? ?Drug Present and Declared for Prescription Verification ?  Oxycodone                      739          EXPECTED   ng/mg creat ?  Noroxycodone                   4595         EXPECTED   ng/mg creat ?   Sources of oxycodone include scheduled prescription medications. ?   Noroxycodone is an expected metabolite of oxycodone. ? ?  Phenobarbital                  PRESENT      EXPECTED ?   Phenobarbital is an expected metabolite of primidone; Phenobarbital ?   may also be administered as a prescription drug. ? ?==================================================================== ?Test                      Result    Flag   Units      Ref Range ?  Creatinine              144              mg/dL      >=20 ?==================================================================== ?Declared Medications: ? The flagging and interpretation on this report are based on the ? following declared medications.  Unexpected results may arise from ? inaccuracies in the declared medications. ? ? **Note: The testing scope of this panel includes these medications: ? ? Oxycodone ? Primidone (Mysoline) ? ? **Note: The testing scope of this panel does not include the ? following reported medications: ? ? Acetaminophen (Excedrin) ? Aspirin (Excedrin) ? Baclofen (Lioresal) ? Caffeine (Excedrin) ? Dimethyl Fumarate ? Duloxetine (Cymbalta) ? Lamotrigine (Lamictal) ? Levetiracetam (Keppra) ? Loperamide ? Meloxicam (Mobic) ? Metformin ? Mirabegron ? Naloxone (Narcan) ? Omeprazole (Prilosec) ? Pregabalin (Lyrica) ? Propranolol (Inderal) ? Sertraline (Zoloft) ? Sitagliptin (Januvia) ?==================================================================== ?For clinical consultation, please call [phone]. ?==================================================================== ?  ?  ? ?ROS  ?Constitutional: Denies any fever or chills ?Gastrointestinal: No reported hemesis, hematochezia, vomiting, or acute GI distress ?Musculoskeletal: Denies any  acute onset joint swelling, redness, loss of ROM, or weakness ?Neurological: No reported episodes of acute onset apraxia, aphasia, dysarthria, agnosia, amnesia, paralysis, loss of coordination, or loss of consciousness ? ?Medication Review  ?Dimethyl Fumarate, aspirin-acetaminophen-caffeine, baclofen, lamoTRIgine, levETIRAcetam, loperamide, meloxicam, metFORMIN, mirabegron ER, naloxone, omeprazole, oxyCODONE, pregabalin, primidone, propranolol, sertraline, and sitaGLIPtin ? ?History Review  ?Allergy: Ms. Bassette is allergic to 5-alpha reductase inhibitors, desvenlafaxine, glatiramer, rebif [interferon beta-1a], and latex. ?Drug: Ms. Krasinski  has no history on file for drug use. ?Alcohol:  reports no history of alcohol use. ?Tobacco:  reports that she quit smoking about 5 years ago. Her smoking use included cigarettes. She has a 9.00 pack-year smoking history. She has quit using smokeless tobacco. ?Social: Ms. Finamore  reports that she quit smoking about 5 years ago. Her smoking use included cigarettes. She has a 9.00 pack-year smoking history. She has quit using smokeless tobacco. She reports that she does not drink alcohol. ?Medical:  has a past medical history of Acute postoperative pain (04/30/2019), Diabetes mellitus without complication ([place]), GERD (gastroesophageal reflux disease), Headache, MS (multiple sclerosis) ([facility]), Numbness and tingling of both legs below knees, Osteoarthritis, Vertigo, and Wears dentures. ?Surgical: Ms. Manfredonia  has a past surgical history that includes Abdominal hysterectomy; Cesarean section; Tumor removal (Left); Cataract extraction w/PHACO (Right, 03/18/2019); and Cataract extraction w/PHACO (  Left, 04/13/2019). ?Family: family history is not on file. She was adopted. ? ?Laboratory Chemistry Profile  ? ?Renal ?Lab Results  ?Component Value Date  ? BUN 7 11/06/2020  ? CREATININE 0.61 11/06/2020  ? BCR 13 11/27/2018  ? GFRAA >60 09/25/2019  ? GFRNONAA >60 11/06/2020  ?   Hepatic ?Lab Results  ?Component Value Date  ? AST 29 11/06/2020  ? ALT 18 11/06/2020  ? ALBUMIN 2.8 (L) 11/06/2020  ? ALKPHOS 101 11/06/2020  ?  ?Electrolytes ?Lab Results  ?Component Value Date  ? NA 138

## 2022-03-12 ENCOUNTER — Ambulatory Visit: Payer: Medicaid Other | Attending: Pain Medicine | Admitting: Pain Medicine

## 2022-03-12 ENCOUNTER — Other Ambulatory Visit: Payer: Self-pay

## 2022-03-12 ENCOUNTER — Encounter: Payer: Self-pay | Admitting: Pain Medicine

## 2022-03-12 VITALS — BP 97/70 | HR 80 | Temp 97.3°F | Resp 16 | Ht 61.0 in | Wt 230.0 lb

## 2022-03-12 DIAGNOSIS — M25551 Pain in right hip: Secondary | ICD-10-CM | POA: Insufficient documentation

## 2022-03-12 DIAGNOSIS — M5442 Lumbago with sciatica, left side: Secondary | ICD-10-CM | POA: Insufficient documentation

## 2022-03-12 DIAGNOSIS — M79604 Pain in right leg: Secondary | ICD-10-CM | POA: Insufficient documentation

## 2022-03-12 DIAGNOSIS — G35 Multiple sclerosis: Secondary | ICD-10-CM | POA: Diagnosis present

## 2022-03-12 DIAGNOSIS — M25561 Pain in right knee: Secondary | ICD-10-CM | POA: Diagnosis present

## 2022-03-12 DIAGNOSIS — M5441 Lumbago with sciatica, right side: Secondary | ICD-10-CM | POA: Diagnosis present

## 2022-03-12 DIAGNOSIS — G8929 Other chronic pain: Secondary | ICD-10-CM | POA: Diagnosis present

## 2022-03-12 DIAGNOSIS — M79605 Pain in left leg: Secondary | ICD-10-CM | POA: Insufficient documentation

## 2022-03-12 DIAGNOSIS — Z79899 Other long term (current) drug therapy: Secondary | ICD-10-CM | POA: Insufficient documentation

## 2022-03-12 DIAGNOSIS — M47816 Spondylosis without myelopathy or radiculopathy, lumbar region: Secondary | ICD-10-CM | POA: Diagnosis present

## 2022-03-12 DIAGNOSIS — M5137 Other intervertebral disc degeneration, lumbosacral region: Secondary | ICD-10-CM | POA: Diagnosis present

## 2022-03-12 DIAGNOSIS — G894 Chronic pain syndrome: Secondary | ICD-10-CM | POA: Insufficient documentation

## 2022-03-12 DIAGNOSIS — M25552 Pain in left hip: Secondary | ICD-10-CM | POA: Diagnosis present

## 2022-03-12 DIAGNOSIS — Z79891 Long term (current) use of opiate analgesic: Secondary | ICD-10-CM | POA: Insufficient documentation

## 2022-03-12 DIAGNOSIS — M533 Sacrococcygeal disorders, not elsewhere classified: Secondary | ICD-10-CM | POA: Insufficient documentation

## 2022-03-12 DIAGNOSIS — M25562 Pain in left knee: Secondary | ICD-10-CM | POA: Insufficient documentation

## 2022-03-12 MED ORDER — OXYCODONE HCL 5 MG PO TABS: 5.0000 mg | ORAL_TABLET | Freq: Four times a day (QID) | ORAL | 0 refills | Status: DC | PRN

## 2022-03-12 NOTE — Progress Notes (Signed)
Nursing Pain Medication Assessment:  ?Safety precautions to be maintained throughout the outpatient stay will include: orient to surroundings, keep bed in low position, maintain call bell within reach at all times, provide assistance with transfer out of bed and ambulation.  ?Medication Inspection Compliance: Shannon Benitez did not comply with our request to bring her pills to be counted. She was reminded that bringing the medication bottles, even when empty, is a requirement. ? ?Medication: None brought in. ?Pill/Patch Count: None available to be counted. ?Bottle Appearance: No container available. Did not bring bottle(s) to appointment. ?Filled Date: N/A ?Last Medication intake:  Today ?

## 2022-03-12 NOTE — Patient Instructions (Signed)
____________________________________________________________________________________________ ? ?Medication Rules ? ?Purpose: To inform patients, and their family members, of our rules and regulations. ? ?Applies to: All patients receiving prescriptions (written or electronic). ? ?Pharmacy of record: Pharmacy where electronic prescriptions will be sent. If written prescriptions are taken to a different pharmacy, please inform the nursing staff. The pharmacy listed in the electronic medical record should be the one where you would like electronic prescriptions to be sent. ? ?Electronic prescriptions: In compliance with the [place] Strengthen Opioid Misuse Prevention (STOP) Act of 2017 (Session Law 2017-74/H243), effective November 05, 2018, all controlled substances must be electronically prescribed. Calling prescriptions to the pharmacy will cease to exist. ? ?Prescription refills: Only during scheduled appointments. Applies to all prescriptions. ? ?NOTE: The following applies primarily to controlled substances (Opioid* Pain Medications).  ? ?Type of encounter (visit): For patients receiving controlled substances, face-to-face visits are required. (Not an option or up to the patient.) ? ?Patient's responsibilities: ?Pain Pills: Bring all pain pills to every appointment (except for procedure appointments). ?Pill Bottles: Bring pills in original pharmacy bottle. Always bring the newest bottle. Bring bottle, even if empty. ?Medication refills: You are responsible for knowing and keeping track of what medications you take and those you need refilled. ?The day before your appointment: write a list of all prescriptions that need to be refilled. ?The day of the appointment: give the list to the admitting nurse. Prescriptions will be written only during appointments. No prescriptions will be written on procedure days. ?If you forget a medication: it will not be "Called in", "Faxed", or "electronically sent". You will  need to get another appointment to get these prescribed. ?No early refills. Do not call asking to have your prescription filled early. ?Prescription Accuracy: You are responsible for carefully inspecting your prescriptions before leaving our office. Have the discharge nurse carefully go over each prescription with you, before taking them home. Make sure that your name is accurately spelled, that your address is correct. Check the name and dose of your medication to make sure it is accurate. Check the number of pills, and the written instructions to make sure they are clear and accurate. Make sure that you are given enough medication to last until your next medication refill appointment. ?Taking Medication: Take medication as prescribed. When it comes to controlled substances, taking less pills or less frequently than prescribed is permitted and encouraged. ?Never take more pills than instructed. ?Never take medication more frequently than prescribed.  ?Inform other Doctors: Always inform, all of your healthcare providers, of all the medications you take. ?Pain Medication from other Providers: You are not allowed to accept any additional pain medication from any other Doctor or Healthcare provider. There are two exceptions to this rule. (see below) In the event that you require additional pain medication, you are responsible for notifying us, as stated below. ?Cough Medicine: Often these contain an opioid, such as codeine or hydrocodone. Never accept or take cough medicine containing these opioids if you are already taking an opioid* medication. The combination may cause respiratory failure and death. ?Medication Agreement: You are responsible for carefully reading and following our Medication Agreement. This must be signed before receiving any prescriptions from our practice. Safely store a copy of your signed Agreement. Violations to the Agreement will result in no further prescriptions. (Additional copies of our  Medication Agreement are available upon request.) ?Laws, Rules, & Regulations: All patients are expected to follow all Federal and State Laws, Statutes, Rules, & Regulations. Ignorance of   the Laws does not constitute a valid excuse.  ?Illegal drugs and Controlled Substances: The use of illegal substances (including, but not limited to marijuana and its derivatives) and/or the illegal use of any controlled substances is strictly prohibited. Violation of this rule may result in the immediate and permanent discontinuation of any and all prescriptions being written by our practice. The use of any illegal substances is prohibited. ?Adopted CDC guidelines & recommendations: Target dosing levels will be at or below 60 MME/day. Use of benzodiazepines** is not recommended. ? ?Exceptions: There are only two exceptions to the rule of not receiving pain medications from other Healthcare Providers. ?Exception #1 (Emergencies): In the event of an emergency (i.e.: accident requiring emergency care), you are allowed to receive additional pain medication. However, you are responsible for: As soon as you are able, call our office [phone], at any time of the day or night, and leave a message stating your name, the date and nature of the emergency, and the name and dose of the medication prescribed. In the event that your call is answered by a member of our staff, make sure to document and save the date, time, and the name of the person that took your information.  ?Exception #2 (Planned Surgery): In the event that you are scheduled by another doctor or dentist to have any type of surgery or procedure, you are allowed (for a period no longer than 30 days), to receive additional pain medication, for the acute post-op pain. However, in this case, you are responsible for picking up a copy of our "Post-op Pain Management for Surgeons" handout, and giving it to your surgeon or dentist. This document is available at our office, and  does not require an appointment to obtain it. Simply go to our office during business hours (Monday-Thursday from 8:00 AM to 4:00 PM) (Friday 8:00 AM to 12:00 Noon) or if you have a scheduled appointment with us, prior to your surgery, and ask for it by name. In addition, you are responsible for: calling our office [phone], at any time of the day or night, and leaving a message stating your name, name of your surgeon, type of surgery, and date of procedure or surgery. Failure to comply with your responsibilities may result in termination of therapy involving the controlled substances. ?Medication Agreement Violation. Following the above rules, including your responsibilities will help you in avoiding a Medication Agreement Violation (?Breaking your Pain Medication Contract?). ? ?*Opioid medications include: morphine, codeine, oxycodone, oxymorphone, hydrocodone, hydromorphone, meperidine, tramadol, tapentadol, buprenorphine, fentanyl, methadone. ?**Benzodiazepine medications include: diazepam (Valium), alprazolam (Xanax), clonazepam (Klonopine), lorazepam (Ativan), clorazepate (Tranxene), chlordiazepoxide (Librium), estazolam (Prosom), oxazepam (Serax), temazepam (Restoril), triazolam (Halcion) ?(Last updated: 08/02/2021) ?____________________________________________________________________________________________ ? ____________________________________________________________________________________________ ? ?Medication Recommendations and Reminders ? ?Applies to: All patients receiving prescriptions (written and/or electronic). ? ?Medication Rules & Regulations: These rules and regulations exist for your safety and that of others. They are not flexible and neither are we. Dismissing or ignoring them will be considered "non-compliance" with medication therapy, resulting in complete and irreversible termination of such therapy. (See document titled "Medication Rules" for more details.) In all conscience,  because of safety reasons, we cannot continue providing a therapy where the patient does not follow instructions. ? ?Pharmacy of record:  ?Definition: This is the pharmacy where your electronic prescriptions w

## 2022-06-05 NOTE — Progress Notes (Unsigned)
Patient: Shannon Benitez  Service Category: E/M  Provider: Francisco A Naveira, MD  DOB: 11/19/1970  DOS: 06/06/2022  Location: Office  MRN: [id number]  Setting: Ambulatory outpatient  Referring Provider: Luyando, Yvonne, MD  Type: Established Patient  Specialty: Interventional Pain Management  PCP: Luyando, Yvonne, MD  Location: Remote location  Delivery: TeleHealth     Virtual Encounter - Pain Management PROVIDER NOTE: Information contained herein reflects review and annotations entered in association with encounter. Interpretation of such information and data should be left to medically-trained personnel. Information provided to patient can be located elsewhere in the medical record under "Patient Instructions". Document created using STT-dictation technology, any transcriptional errors that may result from process are unintentional.    Contact & Pharmacy Preferred: [phone] Home: [phone] (home) Mobile: [phone] (mobile) E-mail: [email or web address]  [facility] - [place], [place] - [address] [place] [address] Phone: [phone] Fax: [phone]  [id number] - [place], [place] - [address] [place] [address] Phone: [phone] Fax: [phone]   Pre-screening  Ms. Marcel offered "in-person" vs "virtual" encounter. She indicated preferring virtual for this encounter.   Reason COVID-19*  Social distancing based on CDC and AMA recommendations.   I contacted Shannon Benitez on 06/06/2022 via telephone.      I clearly identified myself as Francisco A Naveira, MD. I verified that I was speaking with the correct person using two identifiers (Name: Shannon Benitez, and date of birth: 11/09/1970).  Consent I sought verbal advanced consent from Shannon Benitez for virtual visit interactions. I informed Shannon Benitez of possible security and privacy concerns, risks, and limitations associated with providing "not-in-person"  medical evaluation and management services. I also informed Shannon Benitez of the availability of "in-person" appointments. Finally, I informed her that there would be a charge for the virtual visit and that she could be  personally, fully or partially, financially responsible for it. Shannon Benitez expressed understanding and agreed to proceed.   Historic Elements   Shannon Benitez is a 51 y.o. year old, female patient evaluated today after our last contact on 03/12/2022. Shannon Benitez  has a past medical history of Acute postoperative pain (04/30/2019), Diabetes mellitus without complication (HCC), GERD (gastroesophageal reflux disease), Headache, MS (multiple sclerosis) (HCC), Numbness and tingling of both legs below knees, Osteoarthritis, Vertigo, and Wears dentures. She also  has a past surgical history that includes Abdominal hysterectomy; Cesarean section; Tumor removal (Left); Cataract extraction w/PHACO (Right, 03/18/2019); and Cataract extraction w/PHACO (Left, 04/13/2019). Shannon Benitez has a current medication list which includes the following prescription(s): aspirin-acetaminophen-caffeine, dimethyl fumarate, lamotrigine, loperamide, mirabegron er, naloxone, oxycodone, propranolol, baclofen, levetiracetam, meloxicam, metformin, omeprazole, oxycodone, oxycodone, pregabalin, primidone, sertraline, and sitagliptin. She  reports that she quit smoking about 5 years ago. Her smoking use included cigarettes. She has a 9.00 pack-year smoking history. She has quit using smokeless tobacco. She reports that she does not drink alcohol. No history on file for drug use. Shannon Benitez is allergic to 5-alpha reductase inhibitors, desvenlafaxine, glatiramer, rebif [interferon beta-1a], gramineae pollens, and latex.   HPI  Today, she is being contacted for  ***   Pharmacotherapy Assessment   Opioid Analgesic: Oxycodone IR 5 mg, 1 tab PO QID from an initial BID (10 mg/day of oxycodone) (now at 20  mg/day) MME/day: 30 mg/day.   Monitoring: [place] PMP: PDMP reviewed during this encounter.       Pharmacotherapy: No side-effects or adverse reactions reported. Compliance: No problems   identified. Effectiveness: Clinically acceptable. Plan: Refer to "POC". UDS:  Summary  Date Value Ref Range Status  01/03/2022 Note  Final    Comment:    ==================================================================== [employer] Select 13 (MW) ==================================================================== Test                             Result       Flag       Units  Drug Present and Declared for Prescription Verification   Oxycodone                      739          EXPECTED   ng/mg creat   Noroxycodone                   4595         EXPECTED   ng/mg creat    Sources of oxycodone include scheduled prescription medications.    Noroxycodone is an expected metabolite of oxycodone.    Phenobarbital                  PRESENT      EXPECTED    Phenobarbital is an expected metabolite of primidone; Phenobarbital    may also be administered as a prescription drug.  ==================================================================== Test                      Result    Flag   Units      Ref Range   Creatinine              144              mg/dL      >=20 ==================================================================== Declared Medications:  The flagging and interpretation on this report are based on the  following declared medications.  Unexpected results may arise from  inaccuracies in the declared medications.   **Note: The testing scope of this panel includes these medications:   Oxycodone  Primidone (Mysoline)   **Note: The testing scope of this panel does not include the  following reported medications:   Acetaminophen (Excedrin)  Aspirin (Excedrin)  Baclofen (Lioresal)  Caffeine (Excedrin)  Dimethyl Fumarate  Duloxetine (Cymbalta)  Lamotrigine (Lamictal)  Levetiracetam  (Keppra)  Loperamide  Meloxicam (Mobic)  Metformin  Mirabegron  Naloxone (Narcan)  Omeprazole (Prilosec)  Pregabalin (Lyrica)  Propranolol (Inderal)  Sertraline (Zoloft)  Sitagliptin (Januvia) ==================================================================== For clinical consultation, please call [phone]. ====================================================================    No results found for: "CBDTHCR", "D8THCCBX", "D9THCCBX"   Laboratory Chemistry Profile   Renal Lab Results  Component Value Date   BUN 7 11/06/2020   CREATININE 0.61 11/06/2020   BCR 13 11/27/2018   GFRAA >60 09/25/2019   GFRNONAA >60 11/06/2020    Hepatic Lab Results  Component Value Date   AST 29 11/06/2020   ALT 18 11/06/2020   ALBUMIN 2.8 (L) 11/06/2020   ALKPHOS 101 11/06/2020    Electrolytes Lab Results  Component Value Date   NA 138 11/06/2020   K 3.8 11/06/2020   CL 101 11/06/2020   CALCIUM 8.7 (L) 11/06/2020   MG 2.2 11/27/2018    Bone Lab Results  Component Value Date   25OHVITD1 55 11/27/2018   25OHVITD2 <1.0 11/27/2018   25OHVITD3 55 11/27/2018    Inflammation (CRP: Acute Phase) (ESR: Chronic Phase) Lab Results  Component Value Date   CRP 10 11/27/2018   ESRSEDRATE   42 (H) 11/27/2018         Note: Above Lab results reviewed.  Imaging  DG PAIN CLINIC C-ARM 1-60 MIN NO REPORT Fluoro was used, but no Radiologist interpretation will be provided.  Please refer to "NOTES" tab for provider progress note.  Assessment  There were no encounter diagnoses.  Plan of Care  Problem-specific:  No problem-specific Assessment & Plan notes found for this encounter.  Shannon Benitez has a current medication list which includes the following long-term medication(s): lamotrigine, oxycodone, propranolol, baclofen, levetiracetam, meloxicam, metformin, omeprazole, oxycodone, oxycodone, pregabalin, primidone, and sitagliptin.  Pharmacotherapy (Medications Ordered): No  orders of the defined types were placed in this encounter.  Orders:  No orders of the defined types were placed in this encounter.  Follow-up plan:   No follow-ups on file.     Interventional Therapies  Risk  Complexity Considerations:   Estimated body mass index is 37.79 kg/m as calculated from the following:   Height as of this encounter: 5' 1" (1.549 m).   Weight as of this encounter: 200 lb (90.7 kg). NOTE: NO RFA until BMI is <30 Continue encouraging the patient to bring her BMI below 35.   Planned  Pending:   Therapeutic bilateral lumbar facet MBB #6 (#2 of 2023)    Under consideration:   Diagnostic bilateral T11-12 lumbar facet block   Diagnostic right T7-8 TESI   Diagnostic right T7 TFESI   Diagnostic ML CESI   Possible candidate for intrathecal pump trial and implant    Completed:   Palliative bilateral lumbar facet MBB x5 (11/28/2021) (100/95/95) Therapeutic left lumbar facet RFA x3 (05/24/2020)  Therapeutic right lumbar facet RFA x3 (04/07/2020)    Completed by other providers:   Therapeutic/diagnostic bilateral SI BLK x1 (09/05/2021) by Kritopher Karvelas, MD (PMR)    Therapeutic  Palliative (PRN) options:   Palliative bilateral lumbar facet MBB   Palliative left lumbar facet RFA  Palliative right lumbar facet RFA       Recent Visits Date Type Provider Dept  03/12/22 Office Visit Naveira, Francisco, MD [facility]  Showing recent visits within past 90 days and meeting all other requirements Future Appointments Date Type Provider Dept  06/06/22 Office Visit Naveira, Francisco, MD [facility]  Showing future appointments within next 90 days and meeting all other requirements  I discussed the assessment and treatment plan with the patient. The patient was provided an opportunity to ask questions and all were answered. The patient agreed with the plan and demonstrated an understanding of the instructions.  Patient advised to call back or  seek an in-person evaluation if the symptoms or condition worsens.  Duration of encounter: *** minutes.  Note by: Francisco A Naveira, MD Date: 06/06/2022; Time: 6:39 PM 

## 2022-06-06 ENCOUNTER — Ambulatory Visit: Payer: Medicaid Other | Attending: Pain Medicine | Admitting: Pain Medicine

## 2022-06-06 DIAGNOSIS — G8929 Other chronic pain: Secondary | ICD-10-CM

## 2022-06-06 DIAGNOSIS — M25562 Pain in left knee: Secondary | ICD-10-CM

## 2022-06-06 DIAGNOSIS — M25551 Pain in right hip: Secondary | ICD-10-CM

## 2022-06-06 DIAGNOSIS — M25552 Pain in left hip: Secondary | ICD-10-CM

## 2022-06-06 DIAGNOSIS — G894 Chronic pain syndrome: Secondary | ICD-10-CM | POA: Diagnosis not present

## 2022-06-06 DIAGNOSIS — M5441 Lumbago with sciatica, right side: Secondary | ICD-10-CM

## 2022-06-06 DIAGNOSIS — M79604 Pain in right leg: Secondary | ICD-10-CM | POA: Diagnosis not present

## 2022-06-06 DIAGNOSIS — M47816 Spondylosis without myelopathy or radiculopathy, lumbar region: Secondary | ICD-10-CM

## 2022-06-06 DIAGNOSIS — Z79891 Long term (current) use of opiate analgesic: Secondary | ICD-10-CM

## 2022-06-06 DIAGNOSIS — M25561 Pain in right knee: Secondary | ICD-10-CM

## 2022-06-06 DIAGNOSIS — Z79899 Other long term (current) drug therapy: Secondary | ICD-10-CM

## 2022-06-06 DIAGNOSIS — M5442 Lumbago with sciatica, left side: Secondary | ICD-10-CM

## 2022-06-06 DIAGNOSIS — M533 Sacrococcygeal disorders, not elsewhere classified: Secondary | ICD-10-CM

## 2022-06-06 DIAGNOSIS — G35 Multiple sclerosis: Secondary | ICD-10-CM

## 2022-06-06 DIAGNOSIS — M79605 Pain in left leg: Secondary | ICD-10-CM

## 2022-06-06 DIAGNOSIS — M5137 Other intervertebral disc degeneration, lumbosacral region: Secondary | ICD-10-CM

## 2022-06-06 MED ORDER — OXYCODONE HCL 5 MG PO TABS: 5.0000 mg | ORAL_TABLET | Freq: Four times a day (QID) | ORAL | 0 refills | Status: DC | PRN

## 2022-06-06 NOTE — Patient Instructions (Signed)

## 2022-09-03 ENCOUNTER — Encounter: Payer: Self-pay | Admitting: Pain Medicine

## 2022-09-03 ENCOUNTER — Ambulatory Visit: Payer: Medicaid Other | Attending: Pain Medicine | Admitting: Pain Medicine

## 2022-09-03 VITALS — BP 112/75 | HR 74 | Temp 97.8°F | Resp 14 | Ht 61.0 in | Wt 232.0 lb

## 2022-09-03 DIAGNOSIS — M545 Low back pain, unspecified: Secondary | ICD-10-CM | POA: Diagnosis present

## 2022-09-03 DIAGNOSIS — Z79891 Long term (current) use of opiate analgesic: Secondary | ICD-10-CM | POA: Diagnosis present

## 2022-09-03 DIAGNOSIS — Z79899 Other long term (current) drug therapy: Secondary | ICD-10-CM | POA: Insufficient documentation

## 2022-09-03 DIAGNOSIS — G894 Chronic pain syndrome: Secondary | ICD-10-CM | POA: Diagnosis present

## 2022-09-03 DIAGNOSIS — M47816 Spondylosis without myelopathy or radiculopathy, lumbar region: Secondary | ICD-10-CM | POA: Diagnosis present

## 2022-09-03 DIAGNOSIS — M5442 Lumbago with sciatica, left side: Secondary | ICD-10-CM | POA: Insufficient documentation

## 2022-09-03 DIAGNOSIS — M79604 Pain in right leg: Secondary | ICD-10-CM | POA: Diagnosis present

## 2022-09-03 DIAGNOSIS — G35 Multiple sclerosis: Secondary | ICD-10-CM | POA: Insufficient documentation

## 2022-09-03 DIAGNOSIS — M25552 Pain in left hip: Secondary | ICD-10-CM | POA: Diagnosis present

## 2022-09-03 DIAGNOSIS — M533 Sacrococcygeal disorders, not elsewhere classified: Secondary | ICD-10-CM | POA: Insufficient documentation

## 2022-09-03 DIAGNOSIS — M25551 Pain in right hip: Secondary | ICD-10-CM | POA: Insufficient documentation

## 2022-09-03 DIAGNOSIS — M25561 Pain in right knee: Secondary | ICD-10-CM | POA: Insufficient documentation

## 2022-09-03 DIAGNOSIS — M5137 Other intervertebral disc degeneration, lumbosacral region: Secondary | ICD-10-CM | POA: Diagnosis present

## 2022-09-03 DIAGNOSIS — M25562 Pain in left knee: Secondary | ICD-10-CM | POA: Diagnosis present

## 2022-09-03 DIAGNOSIS — M79605 Pain in left leg: Secondary | ICD-10-CM | POA: Diagnosis present

## 2022-09-03 DIAGNOSIS — G8929 Other chronic pain: Secondary | ICD-10-CM | POA: Diagnosis present

## 2022-09-03 DIAGNOSIS — M5441 Lumbago with sciatica, right side: Secondary | ICD-10-CM | POA: Insufficient documentation

## 2022-09-03 MED ORDER — OXYCODONE HCL 5 MG PO TABS: 5.0000 mg | ORAL_TABLET | Freq: Four times a day (QID) | ORAL | 0 refills | Status: DC | PRN

## 2022-09-03 MED ORDER — NALOXONE HCL 4 MG/0.1ML NA LIQD: 1.0000 | NASAL | 0 refills | Status: DC | PRN

## 2022-09-03 NOTE — Progress Notes (Signed)
PROVIDER NOTE: Information contained herein reflects review and annotations entered in association with encounter. Interpretation of such information and data should be left to medically-trained personnel. Information provided to patient can be located elsewhere in the medical record under "Patient Instructions". Document created using STT-dictation technology, any transcriptional errors that may result from process are unintentional.    Patient: Shannon Benitez  Service Category: E/M  Provider: [name], MD  DOB: 10-16-1971  DOS: 09/03/2022  Referring Provider: Danae Orleans, MD  MRN: [id number]  Specialty: Interventional Pain Management  PCP: Danae Orleans, MD  Type: Established Patient  Setting: Ambulatory outpatient    Location: Office  Delivery: Face-to-face     HPI  Shannon Benitez, a 51 y.o. year old female, is here today because of her Chronic pain syndrome [G89.4]. Shannon Benitez primary complain today is Back Pain (lower) Last encounter: My last encounter with her was on 06/06/2022. Pertinent problems: Shannon Benitez has Multiple sclerosis ([place]); Neurogenic pain; Chronic sacroiliac joint pain (Bilateral) (L>R); Chronic pain syndrome; Chronic low back pain (1ry area of Pain) (Bilateral) (L>R) w/ sciatica (Bilateral); Chronic lower extremity pain (2ry area of Pain) (Bilateral) (R>L); Chronic low back pain (Bilateral) (L>R) w/o sciatica; Muscle spasticity; Spondylosis without myelopathy or radiculopathy, lumbosacral region; Lumbar facet syndrome (Bilateral) (R>L); Abnormal MRI, lumbar spine (06/16/2018); Lumbar facet hypertrophy (Multilevel) (Bilateral); Osteoarthritis of facet joint of lumbar spine; Osteoarthritis of lumbar spine; DDD (degenerative disc disease), lumbosacral; Lumbar spondylosis; Diabetic peripheral neuropathy ([place]); 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 Pain Assessment: Severity of Chronic pain is reported as a 10-Worst pain ever/10. Location: Back Right, Left/both legs to the ankles. Onset: More than a month ago. Quality: Aching, Stabbing, Throbbing. Timing: Constant. Modifying factor(s): sitting, procedures. Vitals:  height is 5' 1" (1.549 m) and weight is 232 lb (105.2 kg). Her temporal temperature is 97.8 F (36.6 C). Her blood pressure is 112/75 and her pulse is 74. Her respiration is 14 and oxygen saturation is 97%.   Reason for encounter: medication management.  The patient indicates doing well with the current medication regimen. No adverse reactions or side effects reported to the medications.   The patient indicates recently having had another fall secondary to her MS, which has aggravated her low back pain.  Unfortunately, the patient has continued to gain weight which is not helping with her pain.  Today the patient weighted 232 pounds (BMI of 43.84 kg/m).  This is up from her prior evaluation where her weight was 200 pounds and her BMI was 37.79 kg/m.  Physical exam today shows decreased and painful range of motion of the lumbar spine.  Hyperextension/rotation maneuver of the lumbar spine shows ipsilateral bilateral facet joint arthralgia.  She refers that on the right side the pain goes through the back of the leg and has been going as far down as the ankle.  She has requested that we repeat her lumbar facet injections.  RTCB: 12/12/2022 Nonopioids transfer 08/10/2020: Mobic, baclofen, and  Lyrica.  Pharmacotherapy Assessment  Analgesic: Oxycodone IR 5 mg, 1 tab PO QID from an initial BID (10 mg/day of oxycodone) (  now at 20 mg/day) MME/day: 30 mg/day.   Monitoring: [place] PMP: PDMP reviewed during this encounter.       Pharmacotherapy: No side-effects or adverse reactions reported. Compliance: No problems identified. Effectiveness: Clinically acceptable.  Landis Martins, RN  09/03/2022  1:39 PM  Sign when Signing Visit Nursing Pain Medication Assessment:  Safety precautions to be maintained throughout the outpatient stay will include: orient to surroundings, keep bed in low position, maintain call bell within reach at all times, provide assistance with transfer out of bed and ambulation.  Medication Inspection Compliance: Pill count conducted under aseptic conditions, in front of the patient. Neither the pills nor the bottle was removed from the patient's sight at any time. Once count was completed pills were immediately returned to the patient in their original bottle.  Medication: Oxycodone IR Pill/Patch Count:  47 of 120 pills remain Pill/Patch Appearance: Markings consistent with prescribed medication Bottle Appearance: Standard pharmacy container. Clearly labeled. Filled Date: 10 / 10 / 2023 Last Medication intake:  Today    No results found for: "CBDTHCR" No results found for: "D8THCCBX" No results found for: "D9THCCBX"  UDS:  Summary  Date Value Ref Range Status  01/03/2022 Note  Final    Comment:    ==================================================================== [employer] [facility] 13 (MW) ==================================================================== Test                             Result       Flag       Units  Drug Present and Declared for Prescription Verification   Oxycodone                      739          EXPECTED   ng/mg creat   Noroxycodone                   4595         EXPECTED   ng/mg creat    Sources of oxycodone include scheduled  prescription medications.    Noroxycodone is an expected metabolite of oxycodone.    Phenobarbital                  PRESENT      EXPECTED    Phenobarbital is an expected metabolite of primidone; Phenobarbital    may also be administered as a prescription drug.  ==================================================================== Test                      Result    Flag   Units      Ref Range   Creatinine              144              mg/dL      >=20 ==================================================================== Declared Medications:  The flagging and interpretation on this report are based on the  following declared medications.  Unexpected results may arise from  inaccuracies in the declared medications.   **Note: The testing scope of this panel includes these medications:   Oxycodone  Primidone (Mysoline)   **Note: The testing scope of this panel does not include the  following reported medications:   Acetaminophen (Excedrin)  Aspirin (Excedrin)  Baclofen (Lioresal)  Caffeine (Excedrin)  Dimethyl Fumarate  Duloxetine (Cymbalta)  Lamotrigine (Lamictal)  Levetiracetam (Keppra)  Loperamide  Meloxicam (Mobic)  Metformin  Mirabegron  Naloxone (Narcan)  Omeprazole (  Prilosec)  Pregabalin (Lyrica)  Propranolol (Inderal)  Sertraline (Zoloft)  Sitagliptin (Januvia) ==================================================================== For clinical consultation, please call [phone]. ====================================================================       ROS  Constitutional: Denies any fever or chills Gastrointestinal: No reported hemesis, hematochezia, vomiting, or acute GI distress Musculoskeletal: Denies any acute onset joint swelling, redness, loss of ROM, or weakness Neurological: No reported episodes of acute onset apraxia, aphasia, dysarthria, agnosia, amnesia, paralysis, loss of coordination, or loss of consciousness  Medication Review  Dimethyl  Fumarate, aspirin-acetaminophen-caffeine, baclofen, lamoTRIgine, levETIRAcetam, loperamide, meloxicam, metFORMIN, mirabegron ER, naloxone, omeprazole, oxyCODONE, pregabalin, primidone, propranolol, sertraline, and sitaGLIPtin  History Review  Allergy: Shannon Benitez is allergic to 5-alpha reductase inhibitors, desvenlafaxine, glatiramer, rebif [interferon beta-1a], gramineae pollens, and latex. Drug: Shannon Benitez  has no history on file for drug use. Alcohol:  reports no history of alcohol use. Tobacco:  reports that she quit smoking about 5 years ago. Her smoking use included cigarettes. She has a 9.00 pack-year smoking history. She has quit using smokeless tobacco. Social: Shannon Benitez  reports that she quit smoking about 5 years ago. Her smoking use included cigarettes. She has a 9.00 pack-year smoking history. She has quit using smokeless tobacco. She reports that she does not drink alcohol. Medical:  has a past medical history of Acute postoperative pain (04/30/2019), Diabetes mellitus without complication ([place]), GERD (gastroesophageal reflux disease), Headache, MS (multiple sclerosis) ([facility]), Numbness and tingling of both legs below knees, Osteoarthritis, Vertigo, and Wears dentures. Surgical: Shannon Benitez  has a past surgical history that includes Abdominal hysterectomy; Cesarean section; Tumor removal (Left); Cataract extraction w/PHACO (Right, 03/18/2019); and Cataract extraction w/PHACO (Left, 04/13/2019). Family: family history is not on file. She was adopted.  Laboratory Chemistry Profile   Renal Lab Results  Component Value Date   BUN 7 11/06/2020   CREATININE 0.61 11/06/2020   BCR 13 11/27/2018   GFRAA >60 09/25/2019   GFRNONAA >60 11/06/2020    Hepatic Lab Results  Component Value Date   AST 29 11/06/2020   ALT 18 11/06/2020   ALBUMIN 2.8 (L) 11/06/2020   ALKPHOS 101 11/06/2020    Electrolytes Lab Results  Component Value Date   NA 138 11/06/2020   K 3.8  11/06/2020   CL 101 11/06/2020   CALCIUM 8.7 (L) 11/06/2020   MG 2.2 11/27/2018    Bone Lab Results  Component Value Date   25OHVITD1 55 11/27/2018   25OHVITD2 <1.0 11/27/2018   25OHVITD3 55 11/27/2018    Inflammation (CRP: Acute Phase) (ESR: Chronic Phase) Lab Results  Component Value Date   CRP 10 11/27/2018   ESRSEDRATE 42 (H) 11/27/2018         Note: Above Lab results reviewed.  Recent Imaging Review  DG PAIN CLINIC C-ARM 1-60 MIN NO REPORT Fluoro was used, but no Radiologist interpretation will be provided.  Please refer to "NOTES" tab for provider progress note. Note: Reviewed        Physical Exam  General appearance: Well nourished, well developed, and well hydrated. In no apparent acute distress Mental status: Alert, oriented x 3 (person, place, & time)       Respiratory: No evidence of acute respiratory distress Eyes: PERLA Vitals: BP 112/75   Pulse 74   Temp 97.8 F (36.6 C) (Temporal)   Resp 14   Ht 5' 1" (1.549 m)   Wt 232 lb (105.2 kg)   SpO2 97%   BMI 43.84 kg/m  BMI: Estimated body mass index is 43.84 kg/m as calculated  from the following:   Height as of this encounter: 5' 1" (1.549 m).   Weight as of this encounter: 232 lb (105.2 kg). Ideal: Ideal body weight: 47.8 kg (105 lb 6.1 oz) Adjusted ideal body weight: 70.8 kg (156 lb 0.5 oz)  Assessment   Diagnosis Status  1. Chronic pain syndrome   2. Acute exacerbation of chronic low back pain   3. Chronic low back pain (1ry area of Pain) (Bilateral) (L>R) w/ sciatica (Bilateral)   4. Chronic lower extremity pain (2ry area of Pain) (Bilateral) (R>L)   5. Lumbar facet syndrome (Bilateral) (R>L)   6. Chronic hip pain (Bilateral)   7. DDD (degenerative disc disease), lumbosacral   8. Chronic knee pain (Bilateral)   9. Chronic sacroiliac joint pain (Bilateral) (L>R)   10. Multiple sclerosis ([place])   11. Pharmacologic therapy   12. Chronic use of opiate for therapeutic purpose   13. Encounter for  medication management   14. Encounter for chronic pain management    Controlled Controlled Controlled   Updated Problems: Problem  Depression  Fibromyalgia     Plan of Care  Problem-specific:  No problem-specific Assessment & Plan notes found for this encounter.  Shannon Benitez has a current medication list which includes the following long-term medication(s): baclofen, lamotrigine, levetiracetam, meloxicam, naloxone, omeprazole, [START ON 09/13/2022] oxycodone, [START ON 10/13/2022] oxycodone, [START ON 11/12/2022] oxycodone, pregabalin, primidone, propranolol, propranolol, sitagliptin, baclofen, levetiracetam, meloxicam, metformin, omeprazole, pregabalin, primidone, and sitagliptin.  Pharmacotherapy (Medications Ordered): Meds ordered this encounter  Medications   oxyCODONE (OXY IR/ROXICODONE) 5 MG immediate release tablet    Sig: Take 1 tablet (5 mg total) by mouth every 6 (six) hours as needed for severe pain. Must last 30 days.    Dispense:  120 tablet    Refill:  0    DO NOT: delete (not duplicate); no partial-fill (will deny script to complete), no refill request (F/U required). DISPENSE: 1 day early if closed on fill date. WARN: No CNS-depressants within 8 hrs of med.   oxyCODONE (OXY IR/ROXICODONE) 5 MG immediate release tablet    Sig: Take 1 tablet (5 mg total) by mouth every 6 (six) hours as needed for severe pain. Must last 30 days.    Dispense:  120 tablet    Refill:  0    DO NOT: delete (not duplicate); no partial-fill (will deny script to complete), no refill request (F/U required). DISPENSE: 1 day early if closed on fill date. WARN: No CNS-depressants within 8 hrs of med.   oxyCODONE (OXY IR/ROXICODONE) 5 MG immediate release tablet    Sig: Take 1 tablet (5 mg total) by mouth every 6 (six) hours as needed for severe pain. Must last 30 days.    Dispense:  120 tablet    Refill:  0    DO NOT: delete (not duplicate); no partial-fill (will deny script to complete),  no refill request (F/U required). DISPENSE: 1 day early if closed on fill date. WARN: No CNS-depressants within 8 hrs of med.   naloxone (NARCAN) nasal spray 4 mg/0.1 mL    Sig: Place 1 spray into the nose as needed for up to 365 doses (for opioid-induced respiratory depresssion). In case of emergency (overdose), spray once into each nostril. If no response within 3 minutes, repeat application and call [phone].    Dispense:  1 each    Refill:  0    Instruct patient in proper use of device.   Orders:  Orders Placed This  Encounter  Procedures   LUMBAR FACET(MEDIAL BRANCH NERVE BLOCK) MBNB    Standing Status:   Future    Standing Expiration Date:   12/04/2022    Scheduling Instructions:     Procedure: Lumbar facet block (AKA.: Lumbosacral medial branch nerve block)     Side: Bilateral     Level: L3-4 & L5-S1 Facets (L2, L3, L4, L5, & S1 Medial Branch Nerves)     Sedation: Patient's choice.     Timeframe: ASAA    Order Specific Question:   Where will this procedure be performed?    Answer:   [facility]   Follow-up plan:   Return for procedure (ECT): (B) L-FCT Blk .     Interventional Therapies  Risk  Complexity Considerations:   Estimated body mass index is 43.84 kg/m as calculated from the following:   Height as of this encounter: 5' 1" (1.549 m).   Weight as of this encounter: 232 lb (105.2 kg). NOTE: NO RFA until BMI is <30 Continue encouraging the patient to bring her BMI below 35.   Planned  Pending:   Therapeutic bilateral lumbar facet MBB #7 (#3 of 2023)    Under consideration:   Diagnostic bilateral T11-12 lumbar facet block   Diagnostic right T7-8 TESI   Diagnostic right T7 TFESI   Diagnostic ML CESI   Possible candidate for intrathecal pump trial and implant    Completed:   Palliative bilateral lumbar facet MBB x6 (01/30/2022) (100/95/95) Therapeutic left lumbar facet RFA x3 (05/24/2020)  Therapeutic right lumbar facet RFA x3 (04/07/2020)    Completed by  other providers:   Therapeutic/diagnostic bilateral SI BLK x1 (09/05/2021) by Reece Leader, MD (PMR)    Therapeutic  Palliative (PRN) options:   Palliative bilateral lumbar facet MBB   Palliative left lumbar facet RFA  Palliative right lumbar facet RFA      Recent Visits Date Type Provider Dept  06/06/22 Office Visit Milinda Pointer, MD [facility]  Showing recent visits within past 90 days and meeting all other requirements Today's Visits Date Type Provider Dept  09/03/22 Office Visit Milinda Pointer, MD [facility]  Showing today's visits and meeting all other requirements Future Appointments Date Type Provider Dept  09/20/22 Appointment Milinda Pointer, MD [facility]  Showing future appointments within next 90 days and meeting all other requirements  I discussed the assessment and treatment plan with the patient. The patient was provided an opportunity to ask questions and all were answered. The patient agreed with the plan and demonstrated an understanding of the instructions.  Patient advised to call back or seek an in-person evaluation if the symptoms or condition worsens.  Duration of encounter: 30 minutes.  Total time on encounter, as per AMA guidelines included both the face-to-face and non-face-to-face time personally spent by the physician and/or other qualified health care professional(s) on the day of the encounter (includes time in activities that require the physician or other qualified health care professional and does not include time in activities normally performed by clinical staff). Physician's time may include the following activities when performed: preparing to see the patient (eg, review of tests, pre-charting review of records) obtaining and/or reviewing separately obtained history performing a medically appropriate examination and/or evaluation counseling and educating the patient/family/caregiver ordering  medications, tests, or procedures referring and communicating with other health care professionals (when not separately reported) documenting clinical information in the electronic or other health record independently interpreting results (not separately reported) and  communicating results to the patient/ family/caregiver care coordination (not separately reported)  Note by: [name], MD Date: 09/03/2022; Time: 1:58 PM

## 2022-09-03 NOTE — Patient Instructions (Addendum)
______________________________________________________________________  Preparing for your procedure  During your procedure appointment there will be: No Prescription Refills. No disability issues to discussed. No medication changes or discussions.  Instructions: Food intake: Avoid eating anything solid for at least 8 hours prior to your procedure. Clear liquid intake: You may take clear liquids such as water up to 2 hours prior to your procedure. (No carbonated drinks. No soda.) Transportation: Unless otherwise stated by your physician, bring a driver. Morning Medicines: Except for blood thinners, take all of your other morning medications with a sip of water. Make sure to take your heart and blood pressure medicines. If your blood pressure's lower number is above 100, the case will be rescheduled. Blood thinners: If you take a blood thinner, but were not instructed to stop it, call our office [phone] and ask to talk to a nurse. Not stopping a blood thinner prior to certain procedures could lead to serious complications. Diabetics on insulin: Notify the staff so that you can be scheduled 1st case in the morning. If your diabetes requires high dose insulin, take only  of your normal insulin dose the morning of the procedure and notify the staff that you have done so. Preventing infections: Shower with an antibacterial soap the morning of your procedure.  Build-up your immune system: Take 1000 mg of Vitamin C with every meal (3 times a day) the day prior to your procedure. Antibiotics: Inform the nursing staff if you are taking any antibiotics or if you have any conditions that may require antibiotics prior to procedures. (Example: recent joint implants)   Pregnancy: If you are pregnant make sure to notify the nursing staff. Not doing so may result in injury to the fetus, including death.  Sickness: If you have a cold, fever, or any active infections, call and cancel or reschedule your  procedure. Receiving steroids while having an infection may result in complications. Arrival: You must be in the facility at least 30 minutes prior to your scheduled procedure. Tardiness: Your scheduled time is also the cutoff time. If you do not arrive at least 15 minutes prior to your procedure, you will be rescheduled.  Children: Do not bring any children with you. Make arrangements to keep them home. Dress appropriately: There is always a possibility that your clothing may get soiled. Avoid long dresses. Valuables: Do not bring any jewelry or valuables.  Reasons to call and reschedule or cancel your procedure: (Following these recommendations will minimize the risk of a serious complication.) Surgeries: Avoid having procedures within 2 weeks of any surgery. (Avoid for 2 weeks before or after any surgery). Flu Shots: Avoid having procedures within 2 weeks of a flu shots or . (Avoid for 2 weeks before or after immunizations). Barium: Avoid having a procedure within 7-10 days after having had a radiological study involving the use of radiological contrast. (Myelograms, Barium swallow or enema study). Heart attacks: Avoid any elective procedures or surgeries for the initial 6 months after a "Myocardial Infarction" (Heart Attack). Blood thinners: It is imperative that you stop these medications before procedures. Let us know if you if you take any blood thinner.  Infection: Avoid procedures during or within two weeks of an infection (including chest colds or gastrointestinal problems). Symptoms associated with infections include: Localized redness, fever, chills, night sweats or profuse sweating, burning sensation when voiding, cough, congestion, stuffiness, runny nose, sore throat, diarrhea, nausea, vomiting, cold or Flu symptoms, recent or current infections. It is specially important if the infection is  over the area that we intend to treat. Heart and lung problems: Symptoms that may suggest an  active cardiopulmonary problem include: cough, chest pain, breathing difficulties or shortness of breath, dizziness, ankle swelling, uncontrolled high or unusually low blood pressure, and/or palpitations. If you are experiencing any of these symptoms, cancel your procedure and contact your primary care physician for an evaluation.  Remember:  Regular Business hours are:  Monday to Thursday 8:00 AM to 4:00 PM  Provider's Schedule: Milinda Pointer, MD:  Procedure days: Tuesday and Thursday 7:30 AM to 4:00 PM  Gillis Santa, MD:  Procedure days: Monday and Wednesday 7:30 AM to 4:00 PM  ______________________________________________________________________  ____________________________________________________________________________________________  General Risks and Possible Complications  Patient Responsibilities: It is important that you read this as it is part of your informed consent. It is our duty to inform you of the risks and possible complications associated with treatments offered to you. It is your responsibility as a patient to read this and to ask questions about anything that is not clear or that you believe was not covered in this document.  Patient's Rights: You have the right to refuse treatment. You also have the right to change your mind, even after initially having agreed to have the treatment done. However, under this last option, if you wait until the last second to change your mind, you may be charged for the materials used up to that point.  Introduction: Medicine is not an Chief Strategy Officer. Everything in Medicine, including the lack of treatment(s), carries the potential for danger, harm, or loss (which is by definition: Risk). In Medicine, a complication is a secondary problem, condition, or disease that can aggravate an already existing one. All treatments carry the risk of possible complications. The fact that a side effects or complications occurs, does not imply that  the treatment was conducted incorrectly. It must be clearly understood that these can happen even when everything is done following the highest safety standards.  No treatment: You can choose not to proceed with the proposed treatment alternative. The "PRO(s)" would include: avoiding the risk of complications associated with the therapy. The "CON(s)" would include: not getting any of the treatment benefits. These benefits fall under one of three categories: diagnostic; therapeutic; and/or palliative. Diagnostic benefits include: getting information which can ultimately lead to improvement of the disease or symptom(s). Therapeutic benefits are those associated with the successful treatment of the disease. Finally, palliative benefits are those related to the decrease of the primary symptoms, without necessarily curing the condition (example: decreasing the pain from a flare-up of a chronic condition, such as incurable terminal cancer).  General Risks and Complications: These are associated to most interventional treatments. They can occur alone, or in combination. They fall under one of the following six (6) categories: no benefit or worsening of symptoms; bleeding; infection; nerve damage; allergic reactions; and/or death. No benefits or worsening of symptoms: In Medicine there are no guarantees, only probabilities. No healthcare provider can ever guarantee that a medical treatment will work, they can only state the probability that it may. Furthermore, there is always the possibility that the condition may worsen, either directly, or indirectly, as a consequence of the treatment. Bleeding: This is more common if the patient is taking a blood thinner, either prescription or over the counter (example: Goody Powders, Fish oil, Aspirin, Garlic, etc.), or if suffering a condition associated with impaired coagulation (example: Hemophilia, cirrhosis of the liver, low platelet counts, etc.). However, even if you do  not have one on these, it can still happen. If you have any of these conditions, or take one of these drugs, make sure to notify your treating physician. Infection: This is more common in patients with a compromised immune system, either due to disease (example: diabetes, cancer, human immunodeficiency virus [HIV], etc.), or due to medications or treatments (example: therapies used to treat cancer and rheumatological diseases). However, even if you do not have one on these, it can still happen. If you have any of these conditions, or take one of these drugs, make sure to notify your treating physician. Nerve Damage: This is more common when the treatment is an invasive one, but it can also happen with the use of medications, such as those used in the treatment of cancer. The damage can occur to small secondary nerves, or to large primary ones, such as those in the spinal cord and brain. This damage may be temporary or permanent and it may lead to impairments that can range from temporary numbness to permanent paralysis and/or brain death. Allergic Reactions: Any time a substance or material comes in contact with our body, there is the possibility of an allergic reaction. These can range from a mild skin rash (contact dermatitis) to a severe systemic reaction (anaphylactic reaction), which can result in death. Death: In general, any medical intervention can result in death, most of the time due to an unforeseen complication. ____________________________________________________________________________________________    ______________________________________________________________________________________________  Body mass index (BMI)  Body mass index (BMI) is a common tool for deciding whether a person has an appropriate body weight.  It measures a persons weight in relation to their height.   According to the [facility] of health (NIH): A BMI of less than 18.5 means that a person is  underweight. A BMI of between 18.5 and 24.9 is ideal. A BMI of between 25 and 29.9 is overweight. A BMI over 30 indicates obesity.  Weight Management Required  URGENT: Your weight has been found to be adversely affecting your health.  Dear Ms. Flaten:  Your current Estimated body mass index is 43.84 kg/m as calculated from the following:   Height as of this encounter: _0  (1.549 m).   Weight as of this encounter: 232 lb (105.2 kg).  Please use the table below to identify your weight category and associated incidence of chronic pain, secondary to your weight.  Body Mass Index (BMI) Classification BMI level (kg/m2) Category Associated incidence of chronic pain  <18  Underweight   18.5-24.9 Ideal body weight   25-29.9 Overweight  20%  30-34.9 Obese (Class I)  68%  35-39.9 Severe obesity (Class II)  136%  >40 Extreme obesity (Class III)  254%   In addition: You will be considered "Morbidly Obese", if your BMI is above 30 and you have one or more of the following conditions which are known to be caused and/or directly associated with obesity: 1.    Type 2 Diabetes (Which in turn can lead to cardiovascular diseases (CVD), stroke, peripheral vascular diseases (PVD), retinopathy, nephropathy, and neuropathy) 2.    Cardiovascular Disease (High Blood Pressure; Congestive Heart Failure; High Cholesterol; Coronary Artery Disease; Angina; or History of Heart Attacks) 3.    Breathing problems (Asthma; obesity-hypoventilation syndrome; obstructive sleep apnea; chronic inflammatory airway disease; reactive airway disease; or shortness of breath) 4.    Chronic kidney disease 5.    Liver disease (nonalcoholic fatty liver disease) 6.    High blood pressure 7.  Acid reflux (gastroesophageal reflux disease; heartburn) 8.    Osteoarthritis (OA) (with any of the following: hip pain; knee pain; and/or low back pain) 9.    Low back pain (Lumbar Facet Syndrome; and/or Degenerative Disc  Disease) 10.  Hip pain (Osteoarthritis of hip) (For every 1 lbs of added body weight, there is a 2 lbs increase in pressure inside of each hip articulation. 1:2 mechanical relationship) 11.  Knee pain (Osteoarthritis of knee) (For every 1 lbs of added body weight, there is a 4 lbs increase in pressure inside of each knee articulation. 1:4 mechanical relationship) (patients with a BMI>30 kg/m2 were 6.8 times more likely to develop knee OA than normal-weight individuals) 12.  Cancer: Epidemiological studies have shown that obesity is a risk factor for: post-menopausal breast cancer; cancers of the endometrium, colon and kidney cancer; malignant adenomas of the oesophagus. Obese subjects have an approximately 1.5-3.5-fold increased risk of developing these cancers compared with normal-weight subjects, and it has been estimated that between 15 and 45% of these cancers can be attributed to overweight. More recent studies suggest that obesity may also increase the risk of other types of cancer, including pancreatic, hepatic and gallbladder cancer. (Ref: Obesity and cancer. Pischon T, Nthlings U, [employer] H. Proc Nutr Soc. 2008 May;67(2):128-45. doi: 62.8315/V7616073710626948.) The International Agency for Research on Cancer (IARC) has identified 13 cancers associated with overweight and obesity: meningioma, multiple myeloma, adenocarcinoma of the esophagus, and cancers of the thyroid, postmenopausal breast cancer, gallbladder, stomach, liver, pancreas, kidney, ovaries, uterus, colon and rectal (colorectal) cancers. 66 percent of all cancers diagnosed in women and 24 percent of those diagnosed in men are associated with overweight and obesity.  Recommendation: At this point it is urgent that you take a step back and concentrate in loosing weight. Dedicate 100% of your efforts on this task. Nothing else will improve your health more than bringing your weight down and your BMI to less than 30. If you are here, you  probably have chronic pain. We know that most chronic pain patients have difficulty exercising secondary to their pain. For this reason, you must rely on proper nutrition and diet in order to lose the weight. If your BMI is above 40, you should seriously consider bariatric surgery. A realistic goal is to lose 10% of your body weight over a period of 12 months.  Be honest to yourself, if over time you have unsuccessfully tried to lose weight, then it is time for you to seek professional help and to enter a medically supervised weight management program, and/or undergo bariatric surgery. Stop procrastinating.   Pain management considerations:  1.    Pharmacological Problems: Be advised that the use of opioid analgesics (oxycodone; hydrocodone; morphine; methadone; codeine; and all of their derivatives) have been associated with decreased metabolism and weight gain.  For this reason, should we see that you are unable to lose weight while taking these medications, it may become necessary for [place] to taper down and indefinitely discontinue them.  2.    Technical Problems: The incidence of successful interventional therapies decreases as the patient's BMI increases. It is much more difficult to accomplish a safe and effective interventional therapy on a patient with a BMI above 35. 3.    Radiation Exposure Problems: The x-rays machine, used to accomplish injection therapies, will automatically increase their x-ray output in order to capture an appropriate bone image. This means that radiation exposure increases exponentially with the patient's BMI. (The higher the BMI, the higher  the radiation exposure.) Although the level of radiation used at a given time is still safe to the patient, it is not for the physician and/or assisting staff. Unfortunately, radiation exposure is accumulative. Because physicians and the staff have to do procedures and be exposed on a daily basis, this can result in health problems such as  cancer and radiation burns. Radiation exposure to the staff is monitored by the radiation batches that they wear. The exposure levels are reported back to the staff on a quarterly basis. Depending on levels of exposure, physicians and staff may be obligated by law to decrease this exposure. This means that they have the right and obligation to refuse providing therapies where they may be overexposed to radiation. For this reason, physicians may decline to offer therapies such as radiofrequency ablation or implants to patients with a BMI above 40. 4.    Current Trends: Be advised that the current trend is to no longer offer certain therapies to patients with a BMI equal to, or above 35, due to increase perioperative risks, increased technical procedural difficulties, and excessive radiation exposure to healthcare personnel.  ______________________________________________________________________________________________    ____________________________________________________________________________________________  Patient Information update  To: All of our patients.  Re: Name change.  It has been made official that our current name, "[facility]"   will soon be changed to "[facility]".   The purpose of this change is to eliminate any confusion created by the concept of our practice being a "Medication Management Pain Clinic". In the past this has led to the misconception that we treat pain primarily by the use of prescription medications.  Nothing can be farther from the truth.   Understanding PAIN MANAGEMENT: To further understand what our practice does, you first have to understand that "Pain Management" is a subspecialty that requires additional training once a physician has completed their specialty training, which can be in either Anesthesia, Neurology, Psychiatry, or Physical Medicine  and Rehabilitation (PMR). Each one of these contributes to the final approach taken by each physician to the management of their patient's pain. To be a "Pain Management Specialist" you must have first completed one of the specialty trainings below.  Anesthesiologists - trained in clinical pharmacology and interventional techniques such as nerve blockade and regional as well as central neuroanatomy. They are trained to block pain before, during, and after surgical interventions.  Neurologists - trained in the diagnosis and pharmacological treatment of complex neurological conditions, such as Multiple Sclerosis, Parkinson's, spinal cord injuries, and other systemic conditions that may be associated with symptoms that may include but are not limited to pain. They tend to rely primarily on the treatment of chronic pain using prescription medications.  Psychiatrist - trained in conditions affecting the psychosocial wellbeing of patients including but not limited to depression, anxiety, schizophrenia, personality disorders, addiction, and other substance use disorders that may be associated with chronic pain. They tend to rely primarily on the treatment of chronic pain using prescription medications.   Physical Medicine and Rehabilitation (PMR) physicians, also known as physiatrists - trained to treat a wide variety of medical conditions affecting the brain, spinal cord, nerves, bones, joints, ligaments, muscles, and tendons. Their training is primarily aimed at treating patients that have suffered injuries that have caused severe physical impairment. Their training is primarily aimed at the physical therapy and rehabilitation of those patients. They may also work alongside orthopedic surgeons or neurosurgeons using their  expertise in assisting surgical patients to recover after their surgeries.  INTERVENTIONAL PAIN MANAGEMENT is sub-subspecialty of Pain Management.  Our physicians are Board-certified in  Anesthesia, Pain Management, and Interventional Pain Management.  This meaning that not only have they been trained and Board-certified in their specialty of Anesthesia, and subspecialty of Pain Management, but they have also received further training in the sub-subspecialty of Interventional Pain Management, in order to become Board-certified as INTERVENTIONAL PAIN MANAGEMENT SPECIALIST.    Mission: Our goal is to use our skills in  Trainer as alternatives to the chronic use of prescription opioid medications for the treatment of pain. To make this more clear, we have changed our name to reflect what we do and offer. We will continue to offer medication management assessment and recommendations, but we will not be taking over any patient's medication management.  ____________________________________________________________________________________________  _______________________________________________________________________  Medication Rules  Purpose: To inform patients, and their family members, of our medication rules and regulations.  Applies to: All patients receiving prescriptions from our practice (written or electronic).  Pharmacy of record: This is the pharmacy where your electronic prescriptions will be sent. Make sure we have the correct one.  Electronic prescriptions: In compliance with the [place] (STOP) Act of 2017 (Session Lanny Cramp [phone]), effective November 05, 2018, all controlled substances must be electronically prescribed. Written prescriptions, faxing, or calling prescriptions to a pharmacy will no longer be done.  Prescription refills: These will be provided only during in-person appointments. No medications will be renewed without a "face-to-face" evaluation with your provider. Applies to all prescriptions.  NOTE: The following applies primarily to controlled substances (Opioid* Pain Medications).   Type  of encounter (visit): For patients receiving controlled substances, face-to-face visits are required. (Not an option and not up to the patient.)  Patient's responsibilities: Pain Pills: Bring all pain pills to every appointment (except for procedure appointments). Pill Bottles: Bring pills in original pharmacy bottle. Bring bottle, even if empty. Always bring the bottle of the most recent fill.  Medication refills: You are responsible for knowing and keeping track of what medications you are taking and when is it that you will need a refill. The day before your appointment: write a list of all prescriptions that need to be refilled. The day of the appointment: give the list to the admitting nurse. Prescriptions will be written only during appointments. No prescriptions will be written on procedure days. If you forget a medication: it will not be "Called in", "Faxed", or "electronically sent". You will need to get another appointment to get these prescribed. No early refills. Do not call asking to have your prescription filled early. Partial  or short prescriptions: Occasionally your pharmacy may not have enough pills to fill your prescription.  NEVER ACCEPT a partial fill or a prescription that is short of the total amount of pills that you were prescribed.  With controlled substances the law allows 72 hours for the pharmacy to complete the prescription.  If the prescription is not completed within 72 hours, the pharmacist will require a new prescription to be written. This means that you will be short on your medicine and we WILL NOT send another prescription to complete your original prescription.  Instead, request the pharmacy to send a carrier to a nearby branch to get enough medication to provide you with your full prescription. Prescription Accuracy: You are responsible for carefully inspecting your prescriptions before leaving our office. Have the discharge nurse  carefully go over each prescription  with you, before taking them home. Make sure that your name is accurately spelled, that your address is correct. Check the name and dose of your medication to make sure it is accurate. Check the number of pills, and the written instructions to make sure they are clear and accurate. Make sure that you are given enough medication to last until your next medication refill appointment. Taking Medication: Take medication as prescribed. When it comes to controlled substances, taking less pills or less frequently than prescribed is permitted and encouraged. Never take more pills than instructed. Never take the medication more frequently than prescribed.  Inform other Doctors: Always inform, all of your healthcare providers, of all the medications you take. Pain Medication from other Providers: You are not allowed to accept any additional pain medication from any other Doctor or Healthcare provider. There are two exceptions to this rule. (see below) In the event that you require additional pain medication, you are responsible for notifying us, as stated below. Cough Medicine: Often these contain an opioid, such as codeine or hydrocodone. Never accept or take cough medicine containing these opioids if you are already taking an opioid* medication. The combination may cause respiratory failure and death. Medication Agreement: You are responsible for carefully reading and following our Medication Agreement. This must be signed before receiving any prescriptions from our practice. Safely store a copy of your signed Agreement. Violations to the Agreement will result in no further prescriptions. (Additional copies of our Medication Agreement are available upon request.) Laws, Rules, & Regulations: All patients are expected to follow all Federal and [employer], [employer], Rules, [facility] [facility]. Ignorance of the Laws does not constitute a valid excuse.  Illegal drugs and Controlled Substances: The use of illegal substances  (including, but not limited to marijuana and its derivatives) and/or the illegal use of any controlled substances is strictly prohibited. Violation of this rule may result in the immediate and permanent discontinuation of any and all prescriptions being written by our practice. The use of any illegal substances is prohibited. Adopted CDC guidelines & recommendations: Target dosing levels will be at or below 60 MME/day. Use of benzodiazepines** is not recommended.  Exceptions: There are only two exceptions to the rule of not receiving pain medications from other Healthcare Providers. Exception #1 (Emergencies): In the event of an emergency (i.e.: accident requiring emergency care), you are allowed to receive additional pain medication. However, you are responsible for: As soon as you are able, call our office [phone], at any time of the day or night, and leave a message stating your name, the date and nature of the emergency, and the name and dose of the medication prescribed. In the event that your call is answered by a member of our staff, make sure to document and save the date, time, and the name of the person that took your information.  Exception #2 (Planned Surgery): In the event that you are scheduled by another doctor or dentist to have any type of surgery or procedure, you are allowed (for a period no longer than 30 days), to receive additional pain medication, for the acute post-op pain. However, in this case, you are responsible for picking up a copy of our "Post-op Pain Management for Surgeons" handout, and giving it to your surgeon or dentist. This document is available at our office, and does not require an appointment to obtain it. Simply go to our office during business hours (Monday-Thursday from 8:00 AM  to 4:00 PM) (Friday 8:00 AM to 12:00 Noon) or if you have a scheduled appointment with [place], prior to your surgery, and ask for it by name. In addition, you are responsible for: calling  our office [phone], at any time of the day or night, and leaving a message stating your name, name of your surgeon, type of surgery, and date of procedure or surgery. Failure to comply with your responsibilities may result in termination of therapy involving the controlled substances. Medication Agreement Violation. Following the above rules, including your responsibilities will help you in avoiding a Medication Agreement Violation ("Breaking your Pain Medication Contract").  Consequences:  Not following the above rules may result in permanent discontinuation of medication prescription therapy.  *Opioid medications include: morphine, codeine, oxycodone, oxymorphone, hydrocodone, hydromorphone, meperidine, tramadol, tapentadol, buprenorphine, fentanyl, methadone. **Benzodiazepine medications include: diazepam (Valium), alprazolam (Xanax), clonazepam (Klonopine), lorazepam (Ativan), clorazepate (Tranxene), chlordiazepoxide (Librium), estazolam (Prosom), oxazepam (Serax), temazepam (Restoril), triazolam (Halcion) (Last updated: 08/28/2022) ______________________________________________________________________   ______________________________________________________________________  Medication Recommendations and Reminders  Applies to: All patients receiving prescriptions (written and/or electronic).  Medication Rules & Regulations: You are responsible for reading, knowing, and following our "Medication Rules" document. These exist for your safety and that of others. They are not flexible and neither are we. Dismissing or ignoring them is an act of "non-compliance" that may result in complete and irreversible termination of such medication therapy. For safety reasons, "non-compliance" will not be tolerated. As with the [place] fundamental legal principle of "ignorance of the law is no defense", we will accept no excuses for not having read and knowing the content of documents provided to you by our  practice.  Pharmacy of record:  Definition: This is the pharmacy where your electronic prescriptions will be sent.  We do not endorse any particular pharmacy. It is up to you and your insurance to decide what pharmacy to use.  We do not restrict you in your choice of pharmacy. However, once we write for your prescriptions, we will NOT be re-sending more prescriptions to fix restricted supply problems created by your pharmacy, or your insurance.  The pharmacy listed in the electronic medical record should be the one where you want electronic prescriptions to be sent. If you choose to change pharmacy, simply notify our nursing staff. Changes will be made only during your regular appointments and not over the phone.  Recommendations: Keep all of your pain medications in a safe place, under lock and key, even if you live alone. We will NOT replace lost, stolen, or damaged medication. We do not accept "Police Reports" as proof of medications having been stolen. After you fill your prescription, take 1 week's worth of pills and put them away in a safe place. You should keep a separate, properly labeled bottle for this purpose. The remainder should be kept in the original bottle. Use this as your primary supply, until it runs out. Once it's gone, then you know that you have 1 week's worth of medicine, and it is time to come in for a prescription refill. If you do this correctly, it is unlikely that you will ever run out of medicine. To make sure that the above recommendation works, it is very important that you make sure your medication refill appointments are scheduled at least 1 week before you run out of medicine. To do this in an effective manner, make sure that you do not leave the office without scheduling your next medication management appointment. Always ask the nursing  staff to show you in your prescription , when your medication will be running out. Then arrange for the receptionist to get you a  return appointment, at least 7 days before you run out of medicine. Do not wait until you have 1 or 2 pills left, to come in. This is very poor planning and does not take into consideration that we may need to cancel appointments due to bad weather, sickness, or emergencies affecting our staff. DO NOT ACCEPT A "Partial Fill": If for any reason your pharmacy does not have enough pills/tablets to completely fill or refill your prescription, do not allow for a "partial fill". The law allows the pharmacy to complete that prescription within 72 hours, without requiring a new prescription. If they do not fill the rest of your prescription within those 72 hours, you will need a separate prescription to fill the remaining amount, which we will NOT provide. If the reason for the partial fill is your insurance, you will need to talk to the pharmacist about payment alternatives for the remaining tablets, but again, DO NOT ACCEPT A PARTIAL FILL, unless you can trust your pharmacist to obtain the remainder of the pills within 72 hours.  Prescription refills and/or changes in medication(s):  Prescription refills, and/or changes in dose or medication, will be conducted only during scheduled medication management appointments. (Applies to both, written and electronic prescriptions.) No refills on procedure days. No medication will be changed or started on procedure days. No changes, adjustments, and/or refills will be conducted on a procedure day. Doing so will interfere with the diagnostic portion of the procedure. No phone refills. No medications will be "called into the pharmacy". No Fax refills. No weekend refills. No Holliday refills. No after hours refills.  Remember:  Business hours are:  Monday to Thursday 8:00 AM to 4:00 PM Provider's Schedule: Milinda Pointer, MD - Appointments are:  Medication management: Monday and Wednesday 8:00 AM to 4:00 PM Procedure day: Tuesday and Thursday 7:30 AM to 4:00  PM Gillis Santa, MD - Appointments are:  Medication management: Tuesday and Thursday 8:00 AM to 4:00 PM Procedure day: Monday and Wednesday 7:30 AM to 4:00 PM (Last update: 08/28/2022) ______________________________________________________________________  ____________________________________________________________________________________________  Pharmacy Shortages of Pain Medication   Introduction Shockingly as it may seem, .  "No U.S. Supreme Court decision has ever interpreted the Constitution as guaranteeing a right to health care for all Americans." - https://huff.com/  "With respect to human rights, the [place] has no formally codified right to health, nor does it participate in a human rights treaty that specifies a right to health." - Scott J. Schweikart, JD, MBE  Situation By now, most of our patients have had the experience of being told by their pharmacist that they do not have enough medication to cover their prescription. If you have not had this experience, just know that you soon will.  Problem There appears to be a shortage of these medications, either at the national level or locally. This is happening with all pharmacies. When there is not enough medication, patients are offered a partial fill and they are told that they will try to get the rest of the medicine for them at a later time. If they do not have enough for even a partial fill, the pharmacists are telling the patients to call us (the prescribing physicians) to request that we send another prescription to another pharmacy to get the medicine.   This reordering of a controlled substance creates documentation problems  where additional paperwork needs to be created to explain why two prescriptions for the same period of time and the same medicine are being prescribed to the same patient. It also creates situations where the last appointment note  does not accurately reflect when and what prescriptions were given to a patient. This leads to prescribing errors down the line, in subsequent follow-up visits.   [employer] of Pharmacy ([employer]) Research revealed that Surveyor, quantity .1806 (21 NCAC 46.1806) authorizes pharmacists to the transfer of prescriptions among pharmacies, and it sets forth procedural and recordkeeping requirements for doing so. However, this requires the pharmacist to complete the previously mentioned procedural paperwork to accomplish the transfer. As it turns out, it is much easier for them to have the prescribing physicians do the work.   Possible solutions 1. You can ask your physician to assist you in weaning yourself off these medications. 2. Ask your pharmacy if the medication is in stock, 3 days prior to your refill. 3. If you need a pharmacy change, let us know at your medication management visit. Prescriptions that have already been electronically sent to a pharmacy will not be re-sent to a different pharmacy if your pharmacy of record does not have it in stock. Proper stocking of medication is a pharmacy problem, not a prescriber problem. Work with your pharmacist to solve the problem. 4. Have the [facility] Assembly add a provision to the "STOP ACT" (the law that mandates how controlled substances are prescribed) where there is an exception to the electronic prescribing rule that states that in the event there are shortages of medications the physicians are allowed to use written prescriptions as opposed to electronic ones. This would allow patients to take their prescriptions to a different pharmacy that may have enough medication available to fill the prescription. The problem is that currently there is a law that does not allow for written prescriptions, with the exception of instances where the electronic medical record is down due to technical issues.  5. Have [place] Congress ease the pressure  on pharmaceutical companies, allowing them to produce enough quantities of the medication to adequately supply the population. 6. Have pharmacies keep enough stocks of these medications to cover their client base.  7. Have the [facility] Assembly add a provision to the "STOP ACT" where they ease the regulations surrounding the transfer of controlled substances between pharmacies, so as to simplify the transfer of supplies. As an alternative, develop a system to allow patients to obtain the remainder of their prescription at another one of their pharmacies or at an associate pharmacy.   How this shortage will affect you.  Understand that this is a pharmacy supply problem, not a prescriber problem. Work with your pharmacy to solve it. The job of the prescriber is to evaluate and monitor the patient for the appropriate indications and use of these medicines. It is not the job of the prescriber to supply the medication or to solve problems with that supply. The responsibility and the choice to obtain the medication resides on the patient. By law, supplying the medication is the job of the pharmacy. It is certainly not the job of the prescriber to solve supply problems.   Due to the above problems we are no longer taking patients to write for their pain medication. Future discussions with your physician may include potentially weaning medications or transitioning to alternatives.  We will be focusing primarily on interventional based pain management. We  will continue to evaluate for appropriate indications and we may provide recommendations regarding medication, dose, and schedule, as well as monitoring recommendations, however, we will not be taking over the actual prescribing of these substances. On those patients where we are treating their chronic pain with interventional therapies, exceptions will be considered on a case by case basis. At this time, we will try to continue providing this  supplemental service to those patients we have been managing in the past. However, as of August 1st, 2023, we no longer will be sending additional prescriptions to other pharmacies for the purpose of solving their supply problems. Once we send a prescription to a pharmacy, we will not be resending it again to another pharmacy to cover for their shortages.   What to do. Write as many letters as you can. Recruit the help of family members in writing these letters. Below are some of the places where you can write to make your voice heard. Let them know what the problem is and push them to look for solutions.   Search internet for: "[employer] find your legislators" NoseSwap.is  Search internet for: "[employer] commissioner complaints" Starlas.fi  Search internet for: "[place] complaints" https://www.hernandez-brewer.com/.htm  Search internet for: "[facility] pharmacy complaints" Email [facility] Customer Relations woondaal.com.jsp?callType=store  Search internet for: Programme researcher, broadcasting/film/video customer service complaints" https://www.[facility].com/topic/marketing/contactus/contactus_customerservice.jsp  ____________________________________________________________________________________________  ____________________________________________________________________________________________  Drug Holidays (Slow)  What is a "Drug Holiday"? Drug Holiday: is the name given to the period of time during which a patient stops taking a medication(s) for the purpose of eliminating tolerance to the drug.  Benefits Improved effectiveness of opioids. Decreased opioid dose needed to achieve benefits. Improved pain with lesser dose.  What is tolerance? Tolerance: is the progressive decreased in effectiveness of a drug due to its repetitive use. With  repetitive use, the body gets use to the medication and as a consequence, it loses its effectiveness. This is a common problem seen with opioid pain medications. As a result, a larger dose of the drug is needed to achieve the same effect that used to be obtained with a smaller dose.  How long should a "Drug Holiday" last? You should stay off of the pain medicine for at least 14 consecutive days. (2 weeks)  Should I stop the medicine "cold [place]"? No. You should always coordinate with your Pain Specialist so that he/she can provide you with the correct medication dose to make the transition as smoothly as possible.  How do I stop the medicine? Slowly. You will be instructed to decrease the daily amount of pills that you take by one (1) pill every seven (7) days. This is called a "slow downward taper" of your dose. For example: if you normally take four (4) pills per day, you will be asked to drop this dose to three (3) pills per day for seven (7) days, then to two (2) pills per day for seven (7) days, then to one (1) per day for seven (7) days, and at the end of those last seven (7) days, this is when the "Drug Holiday" would start.   Will I have withdrawals? By doing a "slow downward taper" like this one, it is unlikely that you will experience any significant withdrawal symptoms. Typically, what triggers withdrawals is the sudden stop of a high dose opioid therapy. Withdrawals can usually be avoided by slowly decreasing the dose over a prolonged period of time. If you do not follow these instructions and  decide to stop your medication abruptly, withdrawals may be possible.  What are withdrawals? Withdrawals: refers to the wide range of symptoms that occur after stopping or dramatically reducing opiate drugs after heavy and prolonged use. Withdrawal symptoms do not occur to patients that use low dose opioids, or those who take the medication sporadically. Contrary to benzodiazepine (example: Valium,  Xanax, etc.) or alcohol withdrawals ("Delirium Tremens"), opioid withdrawals are not lethal. Withdrawals are the physical manifestation of the body getting rid of the excess receptors.  Expected Symptoms Early symptoms of withdrawal may include: Agitation Anxiety Muscle aches Increased tearing Insomnia Runny nose Sweating Yawning  Late symptoms of withdrawal may include: Abdominal cramping Diarrhea Dilated pupils Goose bumps Nausea Vomiting  Will I experience withdrawals? Due to the slow nature of the taper, it is very unlikely that you will experience any.  What is a slow taper? Taper: refers to the gradual decrease in dose.  (Last update: 05/25/2020) ____________________________________________________________________________________________   ____________________________________________________________________________________________  CBD (cannabidiol) & Delta-8 (Delta-8 tetrahydrocannabinol) WARNING  Intro: Cannabidiol (CBD) and tetrahydrocannabinol (THC), are two natural compounds found in plants of the Cannabis genus. They can both be extracted from hemp or cannabis. Hemp and cannabis come from the Cannabis sativa plant. Both compounds interact with your body's endocannabinoid system, but they have very different effects. CBD does not produce the high sensation associated with cannabis. Delta-8 tetrahydrocannabinol, also known as delta-8 THC, is a psychoactive substance found in the Cannabis sativa plant, of which marijuana and hemp are two varieties. THC is responsible for the high associated with the illicit use of marijuana.  Applicable to: All individuals currently taking or considering taking CBD (cannabidiol) and, more important, all patients taking opioid analgesic controlled substances (pain medication). (Example: oxycodone; oxymorphone; hydrocodone; hydromorphone; morphine; methadone; tramadol; tapentadol; fentanyl; buprenorphine; butorphanol; dextromethorphan;  meperidine; codeine; etc.)  Legal status: CBD remains a Schedule I drug prohibited for any use. CBD is illegal with one exception. In the [place], CBD has a limited Transport planner (FDA) approval for the treatment of two specific types of epilepsy disorders. Only one CBD product has been approved by the FDA for this purpose: "Epidiolex". FDA is aware that some companies are marketing products containing cannabis and cannabis-derived compounds in ways that violate the [employer], Drug and Cosmetic Act [facility] Act) and that may put the health and safety of consumers at risk. The FDA, a Federal agency, has not enforced the CBD status since 2018. UPDATE: (12/22/2021) The Drug Enforcement Agency ([place]) issued a letter stating that "delta" cannabinoids, including Delta-8-THCO and Delta-9-THCO, synthetically derived from hemp do not qualify as hemp and will be viewed as Schedule I drugs. (Schedule I drugs, substances, or chemicals are defined as drugs with no currently accepted medical use and a high potential for abuse. Some examples of Schedule I drugs are: heroin, lysergic acid diethylamide (LSD), marijuana (cannabis), 3,4-methylenedioxymethamphetamine (ecstasy), methaqualone, and peyote.) (https://jennings.com/)  Legality: Some manufacturers ship CBD products nationally, which is illegal. Often such products are sold online and are therefore available throughout the country. CBD is openly sold in head shops and health food stores in some states where such sales have not been explicitly legalized. Selling unapproved products with unsubstantiated therapeutic claims is not only a violation of the law, but also can put patients at risk, as these products have not been proven to be safe or effective. Federal illegality makes it difficult to conduct research on CBD.  Reference: "FDA Regulation of Cannabis and Cannabis-Derived Products, Including  Cannabidiol (CBD)" -  SeekArtists.com.pt  Warning: CBD is not FDA approved and has not undergo the same manufacturing controls as prescription drugs.  This means that the purity and safety of available CBD may be questionable. Most of the time, despite manufacturer's claims, it is contaminated with THC (delta-9-tetrahydrocannabinol - the chemical in marijuana responsible for the "HIGH").  When this is the case, the [facility] contaminant will trigger a positive urine drug screen (UDS) test for Marijuana (carboxy-THC). Because a positive UDS for any illicit substance is a violation of our medication agreement, your opioid analgesics (pain medicine) may be permanently discontinued. The FDA recently put out a warning about 5 things that everyone should be aware of regarding Delta-8 THC: Delta-8 THC products have not been evaluated or approved by the FDA for safe use and may be marketed in ways that put the public health at risk. The FDA has received adverse event reports involving delta-8 THC-containing products. Delta-8 THC has psychoactive and intoxicating effects. Delta-8 THC manufacturing often involve use of potentially harmful chemicals to create the concentrations of delta-8 THC claimed in the marketplace. The final delta-8 THC product may have potentially harmful by-products (contaminants) due to the chemicals used in the process. Manufacturing of delta-8 THC products may occur in uncontrolled or unsanitary settings, which may lead to the presence of unsafe contaminants or other potentially harmful substances. Delta-8 THC products should be kept out of the reach of children and pets.  MORE ABOUT CBD  General Information: CBD was discovered in [birth year] and it is a derivative of the cannabis sativa genus plants (Marijuana and Hemp). It is one of the 113 identified substances found in Marijuana. It accounts for up to 40% of  the plant's extract. As of 2018, preliminary clinical studies on CBD included research for the treatment of anxiety, movement disorders, and pain. CBD is available and consumed in multiple forms, including inhalation of smoke or vapor, as an aerosol spray, and by mouth. It may be supplied as an oil containing CBD, capsules, dried cannabis, or as a liquid solution. CBD is thought not to be as psychoactive as THC (delta-9-tetrahydrocannabinol - the chemical in marijuana responsible for the "HIGH"). Studies suggest that CBD may interact with different biological target receptors in the body, including cannabinoid and other neurotransmitter receptors. As of 2018 the mechanism of action for its biological effects has not been determined.  Side-effects  Adverse reactions: Dry mouth, diarrhea, decreased appetite, fatigue, drowsiness, malaise, weakness, sleep disturbances, and others.  Drug interactions: CBC may interact with other medications such as blood-thinners. Because CBD causes drowsiness on its own, it also increases the drowsiness caused by other medications, including antihistamines (such as Benadryl), benzodiazepines (Xanax, Ativan, Valium), antipsychotics, antidepressants and opioids, as well as alcohol and supplements such as kava, melatonin and St. John's Wort. Be cautious with the following combinations:   Brivaracetam (Briviact) Brivaracetam is changed and broken down by the body. CBD might decrease how quickly the body breaks down brivaracetam. This might increase levels of brivaracetam in the body.  Caffeine Caffeine is changed and broken down by the body. CBD might decrease how quickly the body breaks down caffeine. This might increase levels of caffeine in the body.  Carbamazepine (Tegretol) Carbamazepine is changed and broken down by the body. CBD might decrease how quickly the body breaks down carbamazepine. This might increase levels of carbamazepine in the body and increase its side  effects.  Citalopram (Celexa) Citalopram is changed and broken down by the body. CBD  might decrease how quickly the body breaks down citalopram. This might increase levels of citalopram in the body and increase its side effects.  Clobazam (Onfi) Clobazam is changed and broken down by the liver. CBD might decrease how quickly the liver breaks down clobazam. This might increase the effects and side effects of clobazam.  Eslicarbazepine (Aptiom) Eslicarbazepine is changed and broken down by the body. CBD might decrease how quickly the body breaks down eslicarbazepine. This might increase levels of eslicarbazepine in the body by a small amount.  Everolimus (Zostress) Everolimus is changed and broken down by the body. CBD might decrease how quickly the body breaks down everolimus. This might increase levels of everolimus in the body.  Lithium Taking higher doses of CBD might increase levels of lithium. This can increase the risk of lithium toxicity.  Medications changed by the liver (Cytochrome P450 1A1 (CYP1A1) substrates) Some medications are changed and broken down by the liver. CBD might change how quickly the liver breaks down these medications. This could change the effects and side effects of these medications.  Medications changed by the liver (Cytochrome P450 1A2 (CYP1A2) substrates) Some medications are changed and broken down by the liver. CBD might change how quickly the liver breaks down these medications. This could change the effects and side effects of these medications.  Medications changed by the liver (Cytochrome P450 1B1 (CYP1B1) substrates) Some medications are changed and broken down by the liver. CBD might change how quickly the liver breaks down these medications. This could change the effects and side effects of these medications.  Medications changed by the liver (Cytochrome P450 2A6 (CYP2A6) substrates) Some medications are changed and broken down by the liver. CBD  might change how quickly the liver breaks down these medications. This could change the effects and side effects of these medications.  Medications changed by the liver (Cytochrome P450 2B6 (CYP2B6) substrates) Some medications are changed and broken down by the liver. CBD might change how quickly the liver breaks down these medications. This could change the effects and side effects of these medications.  Medications changed by the liver (Cytochrome P450 2C19 (CYP2C19) substrates) Some medications are changed and broken down by the liver. CBD might change how quickly the liver breaks down these medications. This could change the effects and side effects of these medications.  Medications changed by the liver (Cytochrome P450 2C8 (CYP2C8) substrates) Some medications are changed and broken down by the liver. CBD might change how quickly the liver breaks down these medications. This could change the effects and side effects of these medications.  Medications changed by the liver (Cytochrome P450 2C9 (CYP2C9) substrates) Some medications are changed and broken down by the liver. CBD might change how quickly the liver breaks down these medications. This could change the effects and side effects of these medications.  Medications changed by the liver (Cytochrome P450 2D6 (CYP2D6) substrates) Some medications are changed and broken down by the liver. CBD might change how quickly the liver breaks down these medications. This could change the effects and side effects of these medications.  Medications changed by the liver (Cytochrome P450 2E1 (CYP2E1) substrates) Some medications are changed and broken down by the liver. CBD might change how quickly the liver breaks down these medications. This could change the effects and side effects of these medications.  Medications changed by the liver (Cytochrome P450 3A4 (CYP3A4) substrates) Some medications are changed and broken down by the liver. CBD might  change how quickly the  liver breaks down these medications. This could change the effects and side effects of these medications.  Medications changed by the liver (Glucuronidated drugs) Some medications are changed and broken down by the liver. CBD might change how quickly the liver breaks down these medications. This could change the effects and side effects of these medications.  Medications that decrease the breakdown of other medications by the liver (Cytochrome P450 2C19 (CYP2C19) inhibitors) CBD is changed and broken down by the liver. Some drugs decrease how quickly the liver changes and breaks down CBD. This could change the effects and side effects of CBD.  Medications that decrease the breakdown of other medications in the liver (Cytochrome P450 3A4 (CYP3A4) inhibitors) CBD is changed and broken down by the liver. Some drugs decrease how quickly the liver changes and breaks down CBD. This could change the effects and side effects of CBD.  Medications that increase breakdown of other medications by the liver (Cytochrome P450 3A4 (CYP3A4) inducers) CBD is changed and broken down by the liver. Some drugs increase how quickly the liver changes and breaks down CBD. This could change the effects and side effects of CBD.  Medications that increase the breakdown of other medications by the liver (Cytochrome P450 2C19 (CYP2C19) inducers) CBD is changed and broken down by the liver. Some drugs increase how quickly the liver changes and breaks down CBD. This could change the effects and side effects of CBD.  Methadone (Dolophine) Methadone is broken down by the liver. CBD might decrease how quickly the liver breaks down methadone. Taking cannabidiol along with methadone might increase the effects and side effects of methadone.  Rufinamide (Banzel) Rufinamide is changed and broken down by the body. CBD might decrease how quickly the body breaks down rufinamide. This might increase levels of  rufinamide in the body by a small amount.  Sedative medications (CNS depressants) CBD might cause sleepiness and slowed breathing. Some medications, called sedatives, can also cause sleepiness and slowed breathing. Taking CBD with sedative medications might cause breathing problems and/or too much sleepiness.  Sirolimus (Rapamune) Sirolimus is changed and broken down by the body. CBD might decrease how quickly the body breaks down sirolimus. This might increase levels of sirolimus in the body.  Stiripentol (Diacomit) Stiripentol is changed and broken down by the body. CBD might decrease how quickly the body breaks down stiripentol. This might increase levels of stiripentol in the body and increase its side effects.  Tacrolimus (Prograf) Tacrolimus is changed and broken down by the body. CBD might decrease how quickly the body breaks down tacrolimus. This might increase levels of tacrolimus in the body.  Tamoxifen (Soltamox) Tamoxifen is changed and broken down by the body. CBD might affect how quickly the body breaks down tamoxifen. This might affect levels of tamoxifen in the body.  Topiramate (Topamax) Topiramate is changed and broken down by the body. CBD might decrease how quickly the body breaks down topiramate. This might increase levels of topiramate in the body by a small amount.  Valproate Valproic acid can cause liver injury. Taking cannabidiol with valproic acid might increase the chance of liver injury. CBD and/or valproic acid might need to be stopped, or the dose might need to be reduced.  Warfarin (Coumadin) CBD might increase levels of warfarin, which can increase the risk for bleeding. CBD and/or warfarin might need to be stopped, or the dose might need to be reduced.  Zonisamide Zonisamide is changed and broken down by the body. CBD might decrease  how quickly the body breaks down zonisamide. This might increase levels of zonisamide in the body by a small amount. (Last  update: 01/03/2022) ____________________________________________________________________________________________  Naloxone Nasal Spray What is this medication? NALOXONE (nal OX one) treats opioid overdose, which causes slow or shallow breathing, severe drowsiness, or trouble staying awake. Call emergency services after using this medication. You may need additional treatment. Naloxone works by reversing the effects of opioids. It belongs to a group of medications called opioid blockers. This medicine may be used for other purposes; ask your health care provider or pharmacist if you have questions. COMMON BRAND NAME(S): Kloxxado, Narcan What should I tell my care team before I take this medication? They need to know if you have any of these conditions: Heart disease Substance use disorder An unusual or allergic reaction to naloxone, other medications, foods, dyes, or preservatives Pregnant or trying to get pregnant Breast-feeding How should I use this medication? This medication is for use in the nose. Lay the person on their back. Support their neck with your hand and allow the head to tilt back before giving the medication. The nasal spray should be given into 1 nostril. After giving the medication, move the person onto their side. Do not remove or test the nasal spray until ready to use. Get emergency medical help right away after giving the first dose of this medication, even if the person wakes up. You should be familiar with how to recognize the signs and symptoms of a narcotic overdose. If more doses are needed, give the additional dose in the other nostril. Talk to your care team about the use of this medication in children. While this medication may be prescribed for children as young as newborns for selected conditions, precautions do apply. Overdosage: If you think you have taken too much of this medicine contact a poison control center or emergency room at once. NOTE: This medicine is  only for you. Do not share this medicine with others. What if I miss a dose? This does not apply. What may interact with this medication? This is only used during an emergency. No interactions are expected during emergency use. This list may not describe all possible interactions. Give your health care provider a list of all the medicines, herbs, non-prescription drugs, or dietary supplements you use. Also tell them if you smoke, drink alcohol, or use illegal drugs. Some items may interact with your medicine. What should I watch for while using this medication? Keep this medication ready for use in the case of an opioid overdose. Make sure that you have the phone number of your care team and local hospital ready. You may need to have additional doses of this medication. Each nasal spray contains a single dose. Some emergencies may require additional doses. After use, bring the treated person to the nearest hospital or call 911. Make sure the treating care team knows that the person has received a dose of this medication. You will receive additional instructions on what to do during and after use of this medication before an emergency occurs. What side effects may I notice from receiving this medication? Side effects that you should report to your care team as soon as possible: Allergic reactions--skin rash, itching, hives, swelling of the face, lips, tongue, or throat Side effects that usually do not require medical attention (report these to your care team if they continue or are bothersome): Constipation Dryness or irritation inside the nose Headache Increase in blood pressure Muscle spasms Stuffy nose  Toothache This list may not describe all possible side effects. Call your doctor for medical advice about side effects. You may report side effects to FDA at 1-800-FDA-1088. Where should I keep my medication? Keep out of the reach of children and pets. Store between 20 and 25 degrees C (68 and  77 degrees F). Do not freeze. Throw away any unused medication after the expiration date. Keep in original box until ready to use. NOTE: This sheet is a summary. It may not cover all possible information. If you have questions about this medicine, talk to your doctor, pharmacist, or health care provider.  2023 Elsevier/Gold Standard (2021-06-30 00:00:00)

## 2022-09-03 NOTE — Progress Notes (Signed)
Nursing Pain Medication Assessment:  Safety precautions to be maintained throughout the outpatient stay will include: orient to surroundings, keep bed in low position, maintain call bell within reach at all times, provide assistance with transfer out of bed and ambulation.  Medication Inspection Compliance: Pill count conducted under aseptic conditions, in front of the patient. Neither the pills nor the bottle was removed from the patient's sight at any time. Once count was completed pills were immediately returned to the patient in their original bottle.  Medication: Oxycodone IR Pill/Patch Count:  47 of 120 pills remain Pill/Patch Appearance: Markings consistent with prescribed medication Bottle Appearance: Standard pharmacy container. Clearly labeled. Filled Date: 10 / 10 / 2023 Last Medication intake:  Today

## 2022-09-20 ENCOUNTER — Ambulatory Visit: Payer: Medicaid Other | Admitting: Pain Medicine

## 2022-09-29 NOTE — Progress Notes (Signed)
(  10/04/2022) NO-SHOW to procedure by Interventional Pain Management Specialist at [facility]: Bilateral lumbar facet block.  Did not call to cancel.       Interventional Therapies  Risk  Complexity Considerations:   Estimated body mass index is 43.84 kg/m as calculated from the following:   Height as of this encounter: '5\' 1"'$  (1.549 m).   Weight as of this encounter: 232 lb (105.2 kg). NOTE: NO RFA until BMI is <30 Continue encouraging the patient to bring her BMI below 35.   Planned  Pending:   Therapeutic bilateral lumbar facet MBB #7 (#3 of 2023) (10/04/2022) NO-SHOW to pain management appointment for procedure: Bilateral lumbar facet block.  Did not call to cancel.   Under consideration:   Diagnostic bilateral T11-12 lumbar facet block   Diagnostic right T7-8 TESI   Diagnostic right T7 TFESI   Diagnostic ML CESI   Possible candidate for intrathecal pump trial and implant    Completed:   Palliative bilateral lumbar facet MBB x6 (01/30/2022) (100/95/95) Therapeutic left lumbar facet RFA x3 (05/24/2020)  Therapeutic right lumbar facet RFA x3 (04/07/2020)  (08/10/2020) referral to bariatric surgery and medical weight management    Completed by other providers:   Therapeutic/diagnostic bilateral SI Blk x1 (09/05/2021) by Reece Leader, MD (PMR)  Diagnostic/therapeutic bilateral L4/5 TFESI x1 (09/12/2017) by Barnet Glasgow, MD [facility] PM & [facility])  Diagnostic/therapeutic bilateral SI Blk x1 (01/30/2017) by Barnet Glasgow, MD [facility] PM & NS)    Therapeutic  Palliative (PRN) options:   Palliative bilateral lumbar facet MBB   Palliative left lumbar facet RFA  Palliative right lumbar facet RFA     Pharmacotherapy:  Nonopioids transfer 08/10/2020: Mobic, baclofen, and Lyrica. Recommendations:   None at this time.     Note by: [name], MD Date: 10/04/2022; Time: 9:10 AM

## 2022-10-01 ENCOUNTER — Other Ambulatory Visit: Payer: Self-pay

## 2022-10-01 ENCOUNTER — Encounter: Payer: Self-pay | Admitting: Radiology

## 2022-10-01 ENCOUNTER — Emergency Department: Payer: Medicaid Other

## 2022-10-01 DIAGNOSIS — Z5321 Procedure and treatment not carried out due to patient leaving prior to being seen by health care provider: Secondary | ICD-10-CM | POA: Diagnosis not present

## 2022-10-01 DIAGNOSIS — R4789 Other speech disturbances: Secondary | ICD-10-CM | POA: Diagnosis present

## 2022-10-01 DIAGNOSIS — R7309 Other abnormal glucose: Secondary | ICD-10-CM | POA: Diagnosis not present

## 2022-10-01 LAB — COMPREHENSIVE METABOLIC PANEL
ALT: 14 U/L (ref 0–44)
AST: 18 U/L (ref 15–41)
Albumin: 4 g/dL (ref 3.5–5.0)
Alkaline Phosphatase: 91 U/L (ref 38–126)
Anion gap: 11 (ref 5–15)
BUN: 19 mg/dL (ref 6–20)
CO2: 24 mmol/L (ref 22–32)
Calcium: 9.7 mg/dL (ref 8.9–10.3)
Chloride: 103 mmol/L (ref 98–111)
Creatinine, Ser: 0.59 mg/dL (ref 0.44–1.00)
GFR, Estimated: >60 mL/min (ref >60)
Glucose, Bld: 357 mg/dL — ABNORMAL HIGH (ref 70–99)
Potassium: 3.4 mmol/L — ABNORMAL LOW (ref 3.5–5.1)
Sodium: 138 mmol/L (ref 135–145)
Total Bilirubin: 0.7 mg/dL (ref 0.3–1.2)
Total Protein: 9 g/dL — ABNORMAL HIGH (ref 6.5–8.1)

## 2022-10-01 LAB — CBC
HCT: 35.2 % — ABNORMAL LOW (ref 36.0–46.0)
Hemoglobin: 11 g/dL — ABNORMAL LOW (ref 12.0–15.0)
MCH: 21.9 pg — ABNORMAL LOW (ref 26.0–34.0)
MCHC: 31.3 g/dL (ref 30.0–36.0)
MCV: 70.1 fL — ABNORMAL LOW (ref 80.0–100.0)
Platelets: 362 10*3/uL (ref 150–400)
RBC: 5.02 MIL/uL (ref 3.87–5.11)
RDW: 21 % — ABNORMAL HIGH (ref 11.5–15.5)
WBC: 9.2 10*3/uL (ref 4.0–10.5)
nRBC: 0 % (ref 0.0–0.2)

## 2022-10-01 LAB — CBG MONITORING, ED: Glucose-Capillary: 354 mg/dL — ABNORMAL HIGH (ref 70–99)

## 2022-10-01 NOTE — ED Provider Triage Note (Signed)
Emergency Medicine Provider Triage Evaluation Note  Shannon Benitez , a 51 y.o. female  was evaluated in triage.  Pt complains of "just not feeling right." Per son, she has had a change in speech and acting abnormal since before Thanksgiving and has progressively worsened. He states that she has been vomiting after taking her medication.   Physical Exam  BP 124/77 (BP Location: Right Arm)   Pulse 60   Temp 98.4 F (36.9 C) (Oral)   Resp 17   Ht '5\' 1"'$  (1.549 m)   Wt 105.2 kg   SpO2 96%   BMI 43.84 kg/m  Gen:   Awake, no distress   Resp:  Normal effort  MSK:   Moves extremities without difficulty  Other:  Slow speech pattern.  Medical Decision Making  Medically screening exam initiated at 2:36 PM.  Appropriate orders placed.  Shannon Benitez was informed that the remainder of the evaluation will be completed by another provider, this initial triage assessment does not replace that evaluation, and the importance of remaining in the ED until their evaluation is complete.    Shannon Dike, FNP 10/01/22 1442

## 2022-10-01 NOTE — ED Triage Notes (Signed)
Just before thanksgiving she forgot to take her MS medications. She is brought in today due to worsening speech.

## 2022-10-02 ENCOUNTER — Emergency Department
Admission: RE | Admit: 2022-10-02 | Discharge: 2022-10-02 | Disposition: A | Discharge status: Home / Self Care | Payer: Medicaid Other | Source: Ambulatory Visit | Attending: Emergency Medicine | Admitting: Emergency Medicine

## 2022-10-02 ENCOUNTER — Emergency Department
Admission: EM | Admit: 2022-10-02 | Discharge: 2022-10-02 | Discharge status: Against Medical Advice | Payer: Medicaid Other | Attending: Emergency Medicine | Admitting: Emergency Medicine

## 2022-10-02 ENCOUNTER — Emergency Department: Payer: Medicaid Other

## 2022-10-02 NOTE — ED Notes (Signed)
No answer when called several times from lobby 

## 2022-10-04 ENCOUNTER — Ambulatory Visit (HOSPITAL_BASED_OUTPATIENT_CLINIC_OR_DEPARTMENT_OTHER): Payer: Medicaid Other | Admitting: Pain Medicine

## 2022-10-04 DIAGNOSIS — Z91199 Patient's noncompliance with other medical treatment and regimen due to unspecified reason: Secondary | ICD-10-CM

## 2022-10-09 ENCOUNTER — Telehealth: Payer: Self-pay | Admitting: Pain Medicine

## 2022-10-09 NOTE — Telephone Encounter (Signed)
Patient is having severe problems with vomiting and diarrhea. She has been to ED x2. She was checking on scheduling her missed procedure appt. She sounds very confused. I explained since she was still having these problems she should wait until this has cleared up before scheduling a procedure. She is going to wait until her med mgmt appt. And talk to Dr Dossie Arbour.

## 2022-10-21 NOTE — Progress Notes (Unsigned)
PROVIDER NOTE: Interpretation of information contained herein should be left to medically-trained personnel. Specific patient instructions are provided elsewhere under "Patient Instructions" section of medical record. This document was created in part using STT-dictation technology, any transcriptional errors that may result from this process are unintentional.  Patient: Shannon Benitez Type: Established DOB: April 26, 1971 MRN: [id number] PCP: Shannon Orleans, MD  Service: Procedure DOS: 10/25/2022 Setting: Ambulatory Location: Ambulatory outpatient facility Delivery: Face-to-face Provider: [name], MD Specialty: Interventional Pain Management Specialty designation: 09 Location: Outpatient facility Ref. Prov.: Shannon Orleans, MD    Procedure:           Type: Lumbar Facet, Medial Branch Block(s) #7 (#3 of 2023)   Laterality: Bilateral  Level: L3, L4, L5, and S1 Medial Branch Level(s). Injecting these levels blocks the L4-5 and L5-S1 lumbar facet joints. Imaging: Fluoroscopic guidance         Anesthesia: Local anesthesia (1-2% Lidocaine) Anxiolysis: IV Versed 2.0 mg (Very sedated) (we suspect unreported self-premedication) Sedation: Moderate Sedation None required. No Fentanyl administered.         DOS: 10/25/2022 Performed by: Shannon Cola, MD  Primary Purpose: Diagnostic/Therapeutic Indications: Low back pain severe enough to impact quality of life or function. 1. Lumbar facet syndrome (Bilateral) (R>L)   2. Spondylosis without myelopathy or radiculopathy, lumbosacral region   3. Lumbar facet hypertrophy (Multilevel) (Bilateral)   4. Osteoarthritis of facet joint of lumbar spine   5. Osteoarthritis of lumbar spine   6. DDD (degenerative disc disease), lumbosacral   7. Chronic low back pain (Bilateral) (L>R) w/o sciatica   8. Abnormal MRI, lumbar spine (06/16/2018)   9. Latex precautions, history of latex allergy    NAS-11 Pain score:   Pre-procedure: 10-Worst  pain ever/10   Post-procedure: 0-No pain/10   Note: The patient refers having recently experienced another fall which has aggravated her low back pain and she is also experiencing tailbone pain.  Confirmed by patient to have taken some pain medication prior to procedure.    Position / Prep / Materials:  Position: Prone  Prep solution: DuraPrep (Iodine Povacrylex [0.7% available iodine] and Isopropyl Alcohol, 74% w/w) Area Prepped: Posterolateral Lumbosacral Spine (Wide prep: From the lower border of the scapula down to the end of the tailbone and from flank to flank.)  Materials:  Tray: Block Needle(s):  Type: Spinal  Gauge (G): 22  Length: 5-in Qty: 4     Pre-op H&P Assessment:  Shannon Benitez is a 51 y.o. (year old), female patient, seen today for interventional treatment. She  has a past surgical history that includes Abdominal hysterectomy; Cesarean section; Tumor removal (Left); Cataract extraction w/PHACO (Right, 03/18/2019); and Cataract extraction w/PHACO (Left, 04/13/2019). Shannon Benitez has a current medication list which includes the following prescription(s): aspirin-acetaminophen-caffeine, baclofen, dimethyl fumarate, dimethyl fumarate, lamotrigine, levetiracetam, levetiracetam, loperamide, meloxicam, metformin, mirabegron er, naloxone, omeprazole, oxycodone, [START ON 11/12/2022] oxycodone, pregabalin, primidone, propranolol, propranolol, sertraline, sitagliptin, baclofen, meloxicam, omeprazole, oxycodone, pregabalin, primidone, and sitagliptin, and the following Facility-Administered Medications: fentanyl. Her primarily concern today is the Back Pain (lower)  Initial Vital Signs:  Pulse/HCG Rate: 86ECG Heart Rate: 72 (NSR) Temp: (!) 97.2 F (36.2 C) Resp: 18 BP: (!) 97/44 SpO2: 94 %  BMI: Estimated body mass index is 43.84 kg/m as calculated from the following:   Height as of this encounter: '5\' 1"'$  (1.549 m).   Weight as of this encounter: 232 lb (105.2 kg).  Risk  Assessment: Allergies: Reviewed. She is allergic to 5-alpha reductase inhibitors, desvenlafaxine,  glatiramer, rebif [interferon beta-1a], gramineae pollens, and latex.  Allergy Precautions: None required Coagulopathies: Reviewed. None identified.  Blood-thinner therapy: None at this time Active Infection(s): Reviewed. None identified. Shannon Benitez is afebrile  Site Confirmation: Shannon Benitez was asked to confirm the procedure and laterality before marking the site Procedure checklist: Completed Consent: Before the procedure and under the influence of no sedative(s), amnesic(s), or anxiolytics, the patient was informed of the treatment options, risks and possible complications. To fulfill our ethical and legal obligations, as recommended by the American Medical Association's Code of Ethics, I have informed the patient of my clinical impression; the nature and purpose of the treatment or procedure; the risks, benefits, and possible complications of the intervention; the alternatives, including doing nothing; the risk(s) and benefit(s) of the alternative treatment(s) or procedure(s); and the risk(s) and benefit(s) of doing nothing. The patient was provided information about the general risks and possible complications associated with the procedure. These may include, but are not limited to: failure to achieve desired goals, infection, bleeding, organ or nerve damage, allergic reactions, paralysis, and death. In addition, the patient was informed of those risks and complications associated to Spine-related procedures, such as failure to decrease pain; infection (i.e.: Meningitis, epidural or intraspinal abscess); bleeding (i.e.: epidural hematoma, subarachnoid hemorrhage, or any other type of intraspinal or peri-dural bleeding); organ or nerve damage (i.e.: Any type of peripheral nerve, nerve root, or spinal cord injury) with subsequent damage to sensory, motor, and/or autonomic systems, resulting in  permanent pain, numbness, and/or weakness of one or several areas of the body; allergic reactions; (i.e.: anaphylactic reaction); and/or death. Furthermore, the patient was informed of those risks and complications associated with the medications. These include, but are not limited to: allergic reactions (i.e.: anaphylactic or anaphylactoid reaction(s)); adrenal axis suppression; blood sugar elevation that in diabetics may result in ketoacidosis or comma; water retention that in patients with history of congestive heart failure may result in shortness of breath, pulmonary edema, and decompensation with resultant heart failure; weight gain; swelling or edema; medication-induced neural toxicity; particulate matter embolism and blood vessel occlusion with resultant organ, and/or nervous system infarction; and/or aseptic necrosis of one or more joints. Finally, the patient was informed that Medicine is not an exact science; therefore, there is also the possibility of unforeseen or unpredictable risks and/or possible complications that may result in a catastrophic outcome. The patient indicated having understood very clearly. We have given the patient no guarantees and we have made no promises. Enough time was given to the patient to ask questions, all of which were answered to the patient's satisfaction. Ms. Eagles has indicated that she wanted to continue with the procedure. Attestation: I, the ordering provider, attest that I have discussed with the patient the benefits, risks, side-effects, alternatives, likelihood of achieving goals, and potential problems during recovery for the procedure that I have provided informed consent. Date  Time: 10/25/2022  8:17 AM  Pre-Procedure Preparation:  Monitoring: As per clinic protocol. Respiration, ETCO2, SpO2, BP, heart rate and rhythm monitor placed and checked for adequate function Safety Precautions: Patient was assessed for positional comfort and pressure  points before starting the procedure. Time-out: I initiated and conducted the "Time-out" before starting the procedure, as per protocol. The patient was asked to participate by confirming the accuracy of the "Time Out" information. Verification of the correct person, site, and procedure were performed and confirmed by me, the nursing staff, and the patient. "Time-out" conducted as per Joint Commission's Universal Protocol (UP.01.01.01).  Time: 9211  Description of Procedure:          Laterality: Bilateral. The procedure was performed in identical fashion on both sides. Targeted Levels: L3, L4, L5, and S1  Medial Branch Level(s)  Safety Precautions: Aspiration looking for blood return was conducted prior to all injections. At no point did we inject any substances, as a needle was being advanced. Before injecting, the patient was told to immediately notify me if she was experiencing any new onset of "ringing in the ears, or metallic taste in the mouth". No attempts were made at seeking any paresthesias. Safe injection practices and needle disposal techniques used. Medications properly checked for expiration dates. SDV (single dose vial) medications used. After the completion of the procedure, all disposable equipment used was discarded in the proper designated medical waste containers. Local Anesthesia: Protocol guidelines were followed. The patient was positioned over the fluoroscopy table. The area was prepped in the usual manner. The time-out was completed. The target area was identified using fluoroscopy. A 12-in long, straight, sterile hemostat was used with fluoroscopic guidance to locate the targets for each level blocked. Once located, the skin was marked with an approved surgical skin marker. Once all sites were marked, the skin (epidermis, dermis, and hypodermis), as well as deeper tissues (fat, connective tissue and muscle) were infiltrated with a small amount of a short-acting local anesthetic,  loaded on a 10cc syringe with a 25G, 1.5-in  Needle. An appropriate amount of time was allowed for local anesthetics to take effect before proceeding to the next step. Local Anesthetic: Lidocaine 2.0% The unused portion of the local anesthetic was discarded in the proper designated containers. Technical description of process:   L3 Medial Branch Nerve Block (MBB): The target area for the L3 medial branch is at the junction of the postero-lateral aspect of the superior articular process and the superior, posterior, and medial edge of the transverse process of L4. Under fluoroscopic guidance, a Quincke needle was inserted until contact was made with os over the superior postero-lateral aspect of the pedicular shadow (target area). After negative aspiration for blood, 0.5 mL of the nerve block solution was injected without difficulty or complication. The needle was removed intact. L4 Medial Branch Nerve Block (MBB): The target area for the L4 medial branch is at the junction of the postero-lateral aspect of the superior articular process and the superior, posterior, and medial edge of the transverse process of L5. Under fluoroscopic guidance, a Quincke needle was inserted until contact was made with os over the superior postero-lateral aspect of the pedicular shadow (target area). After negative aspiration for blood, 0.5 mL of the nerve block solution was injected without difficulty or complication. The needle was removed intact. L5 Medial Branch Nerve Block (MBB): The target area for the L5 medial branch is at the junction of the postero-lateral aspect of the superior articular process and the superior, posterior, and medial edge of the sacral ala. Under fluoroscopic guidance, a Quincke needle was inserted until contact was made with os over the superior postero-lateral aspect of the pedicular shadow (target area). After negative aspiration for blood, 0.5 mL of the nerve block solution was injected without  difficulty or complication. The needle was removed intact. S1 Medial Branch Nerve Block (MBB): The target area for the S1 medial branch is at the posterior and inferior 6 o'clock position of the L5-S1 facet joint. Under fluoroscopic guidance, the Quincke needle inserted for the L5 MBB was redirected until contact was made  with os over the inferior and postero aspect of the sacrum, at the 6 o' clock position under the L5-S1 facet joint (Target area). After negative aspiration for blood, 0.5 mL of the nerve block solution was injected without difficulty or complication. The needle was removed intact.  Once the entire procedure was completed, the treated area was cleaned, making sure to leave some of the prepping solution back to take advantage of its long term bactericidal properties.         Illustration of the posterior view of the lumbar spine and the posterior neural structures. Laminae of L2 through S1 are labeled. DPRL5, dorsal primary ramus of L5; DPRS1, dorsal primary ramus of S1; DPR3, dorsal primary ramus of L3; FJ, facet (zygapophyseal) joint L3-L4; I, inferior articular process of L4; LB1, lateral branch of dorsal primary ramus of L1; IAB, inferior articular branches from L3 medial branch (supplies L4-L5 facet joint); IBP, intermediate branch plexus; MB3, medial branch of dorsal primary ramus of L3; NR3, third lumbar nerve root; S, superior articular process of L5; SAB, superior articular branches from L4 (supplies L4-5 facet joint also); TP3, transverse process of L3.  Vitals:   10/25/22 0937 10/25/22 0942 10/25/22 0948 10/25/22 1002  BP: 118/78 111/80 111/75 114/82  Pulse: 71 72 69 70  Resp: '13 12 13 16  '$ Temp:    (!) 97.1 F (36.2 C)  TempSrc:    Tympanic  SpO2: 97% 97% 98% 98%  Weight:      Height:         Start Time: 0853 hrs. End Time: 0859 hrs.  Imaging Guidance (Spinal):          Type of Imaging Technique: Fluoroscopy Guidance (Spinal) Indication(s): Assistance in needle  guidance and placement for procedures requiring needle placement in or near specific anatomical locations not easily accessible without such assistance. Exposure Time: Please see nurses notes. Contrast: None used. Fluoroscopic Guidance: I was personally present during the use of fluoroscopy. "Tunnel Vision Technique" used to obtain the best possible view of the target area. Parallax error corrected before commencing the procedure. "Direction-depth-direction" technique used to introduce the needle under continuous pulsed fluoroscopy. Once target was reached, antero-posterior, oblique, and lateral fluoroscopic projection used confirm needle placement in all planes. Images permanently stored in EMR. Interpretation: No contrast injected. I personally interpreted the imaging intraoperatively. Adequate needle placement confirmed in multiple planes. Permanent images saved into the patient's record.  Antibiotic Prophylaxis:   Anti-infectives (From admission, onward)    None      Indication(s): None identified  Post-operative Assessment:  Post-procedure Vital Signs:  Pulse/HCG Rate: 7072 Temp: (!) 97.1 F (36.2 C) Resp: 16 BP: 114/82 SpO2: 98 %  EBL: None  Complications: No immediate post-treatment complications observed by team, or reported by patient.  Note: The patient tolerated the entire procedure well. A repeat set of vitals were taken after the procedure and the patient was kept under observation following institutional policy, for this type of procedure. Post-procedural neurological assessment was performed, showing return to baseline, prior to discharge. The patient was provided with post-procedure discharge instructions, including a section on how to identify potential problems. Should any problems arise concerning this procedure, the patient was given instructions to immediately contact us, at any time, without hesitation. In any case, we plan to contact the patient by telephone for a  follow-up status report regarding this interventional procedure.  Comments:  No additional relevant information.  Plan of Care  Orders:  Orders Placed This Encounter  Procedures   LUMBAR FACET(MEDIAL BRANCH NERVE BLOCK) MBNB    Scheduling Instructions:     Procedure: Lumbar facet block (AKA.: Lumbosacral medial branch nerve block)     Side: Bilateral     Level: L3-4, L4-5, and L5-S1 Facets (L2, L3, L4, L5, and S1 Medial Branch Nerves)     Sedation: Patient's choice.     Timeframe: Today    Order Specific Question:   Where will this procedure be performed?    Answer:   [facility]   DG [facility] C-ARM 1-60 MIN NO REPORT    Intraoperative interpretation by procedural physician at [place].    Standing Status:   Standing    Number of Occurrences:   1    Order Specific Question:   Reason for exam:    Answer:   Assistance in needle guidance and placement for procedures requiring needle placement in or near specific anatomical locations not easily accessible without such assistance.   Informed Consent Details: Physician/Practitioner Attestation; Transcribe to consent form and obtain patient signature    Nursing Order: Transcribe to consent form and obtain patient signature. Note: Always confirm laterality of pain with Ms. Fridman, before procedure.    Order Specific Question:   Physician/Practitioner attestation of informed consent for procedure/surgical case    Answer:   I, the physician/practitioner, attest that I have discussed with the patient the benefits, risks, side effects, alternatives, likelihood of achieving goals and potential problems during recovery for the procedure that I have provided informed consent.    Order Specific Question:   Procedure    Answer:   Lumbar Facet Block  under fluoroscopic guidance    Order Specific Question:   Physician/Practitioner performing the procedure    Answer:   Carless Slatten A. Dossie Arbour MD    Order Specific Question:    Indication/Reason    Answer:   Low Back Pain, with our without leg pain, due to Facet Joint Arthralgia (Joint Pain) Spondylosis (Arthritis of the Spine), without myelopathy or radiculopathy (Nerve Damage).   Provide equipment / supplies at bedside    Procedure tray: "Block Tray" (Disposable  single use) Skin infiltration needle: Regular 1.5-in, 25-G, (x1) Block Needle type: Spinal Amount/quantity: 4 Size: Medium (5-inch) Gauge: 22G    Standing Status:   Standing    Number of Occurrences:   1    Order Specific Question:   Specify    Answer:   Block Tray   Latex precautions    Activate Latex-Free Protocol.    Standing Status:   Standing    Number of Occurrences:   1   Chronic Opioid Analgesic:  Oxycodone IR 5 mg, 1 tab PO QID from an initial BID (10 mg/day of oxycodone) (now at 20 mg/day) MME/day: 30 mg/day.   Medications ordered for procedure: Meds ordered this encounter  Medications   lidocaine (XYLOCAINE) 2 % (with pres) injection 400 mg   pentafluoroprop-tetrafluoroeth (GEBAUERS) aerosol   lactated ringers infusion   midazolam (VERSED) 5 MG/5ML injection 0.5-2 mg    Make sure Flumazenil is available in the pyxis when using this medication. If oversedation occurs, administer 0.2 mg IV over 15 sec. If after 45 sec no response, administer 0.2 mg again over 1 min; may repeat at 1 min intervals; not to exceed 4 doses (1 mg)   fentaNYL (SUBLIMAZE) injection 25-50 mcg    Make sure Narcan is available in the pyxis when using this medication. In the event of respiratory depression (RR< 8/min):  Titrate NARCAN (naloxone) in increments of 0.1 to 0.2 mg IV at 2-3 minute intervals, until desired degree of reversal.   ropivacaine (PF) 2 mg/mL (0.2%) (NAROPIN) injection 18 mL   triamcinolone acetonide (KENALOG-40) injection 80 mg   Medications administered: We administered lidocaine, pentafluoroprop-tetrafluoroeth, lactated ringers, midazolam, ropivacaine (PF) 2 mg/mL (0.2%), and triamcinolone  acetonide.  See the medical record for exact dosing, route, and time of administration.  Follow-up plan:   Return in about 2 weeks (around 11/08/2022) for Proc-day (T,Th), (F2F), (PPE).       Interventional Therapies  Risk  Complexity Considerations:   Estimated body mass index is 43.84 kg/m as calculated from the following:   Height as of this encounter: '5\' 1"'$  (1.549 m).   Weight as of this encounter: 232 lb (105.2 kg). NOTE: NO RFA until BMI is <30 Continue encouraging the patient to bring her BMI below 35.   Planned  Pending:   Therapeutic bilateral lumbar facet MBB #7 (#3 of 2023) (10/04/2022) NO-SHOW to pain management appointment for procedure: Bilateral lumbar facet block.  Did not call to cancel.   Under consideration:   Diagnostic bilateral T11-12 lumbar facet block   Diagnostic right T7-8 TESI   Diagnostic right T7 TFESI   Diagnostic ML CESI   Possible candidate for intrathecal pump trial and implant    Completed:   Palliative bilateral lumbar facet MBB x7 (10/25/2022) (100/95/95) Therapeutic left lumbar facet RFA x3 (05/24/2020)  Therapeutic right lumbar facet RFA x3 (04/07/2020)  (08/10/2020) referral to bariatric surgery and medical weight management    Completed by other providers:   Therapeutic/diagnostic bilateral SI Blk x1 (09/05/2021) by Reece Leader, MD (PMR)  Diagnostic/therapeutic bilateral L4/5 TFESI x1 (09/12/2017) by Barnet Glasgow, MD [facility])  Diagnostic/therapeutic bilateral SI Blk x1 (01/30/2017) by Barnet Glasgow, MD [facility])    Therapeutic  Palliative (PRN) options:   Palliative bilateral lumbar facet MBB   Palliative left lumbar facet RFA  Palliative right lumbar facet RFA     Pharmacotherapy:  Nonopioids transfer 08/10/2020: Mobic, baclofen, and Lyrica. Recommendations:   None at this time.      Recent Visits Date Type Provider Dept  09/03/22 Office Visit Milinda Pointer, MD [facility]  Showing recent visits  within past 90 days and meeting all other requirements Today's Visits Date Type Provider Dept  10/25/22 Procedure visit Milinda Pointer, MD [facility]  Showing today's visits and meeting all other requirements Future Appointments Date Type Provider Dept  11/08/22 Appointment Milinda Pointer, MD [facility]  12/05/22 Appointment Milinda Pointer, MD [facility]  Showing future appointments within next 90 days and meeting all other requirements  Disposition: Discharge home  Discharge (Date  Time): 10/25/2022; 1011 hrs.   Primary Care Physician: Shannon Orleans, MD Location: [facility] Note by: Shannon Cola, MD Date: 10/25/2022; Time: 10:44 AM  Disclaimer:  Medicine is not an Chief Strategy Officer. The only guarantee in medicine is that nothing is guaranteed. It is important to note that the decision to proceed with this intervention was based on the information collected from the patient. The Data and conclusions were drawn from the patient's questionnaire, the interview, and the physical examination. Because the information was provided in large part by the patient, it cannot be guaranteed that it has not been purposely or unconsciously manipulated. Every effort has been made to obtain as much relevant data as possible for this evaluation. It is important to  note that the conclusions that lead to this procedure are derived in large part from the available data. Always take into account that the treatment will also be dependent on availability of resources and existing treatment guidelines, considered by other Pain Management Practitioners as being common knowledge and practice, at the time of the intervention. For Medico-Legal purposes, it is also important to point out that variation in procedural techniques and pharmacological choices are the acceptable norm. The indications, contraindications, technique, and results of the  above procedure should only be interpreted and judged by a Board-Certified Interventional Pain Specialist with extensive familiarity and expertise in the same exact procedure and technique.

## 2022-10-25 ENCOUNTER — Ambulatory Visit: Payer: Medicaid Other | Attending: Pain Medicine | Admitting: Pain Medicine

## 2022-10-25 ENCOUNTER — Encounter: Payer: Self-pay | Admitting: Pain Medicine

## 2022-10-25 ENCOUNTER — Ambulatory Visit
Admission: RE | Admit: 2022-10-25 | Discharge: 2022-10-25 | Disposition: A | Discharge status: Home / Self Care | Payer: Medicaid Other | Source: Ambulatory Visit | Attending: Pain Medicine | Admitting: Pain Medicine

## 2022-10-25 VITALS — BP 114/82 | HR 70 | Temp 97.1°F | Resp 16 | Ht 61.0 in | Wt 232.0 lb

## 2022-10-25 DIAGNOSIS — M47816 Spondylosis without myelopathy or radiculopathy, lumbar region: Secondary | ICD-10-CM | POA: Diagnosis present

## 2022-10-25 DIAGNOSIS — M545 Low back pain, unspecified: Secondary | ICD-10-CM | POA: Insufficient documentation

## 2022-10-25 DIAGNOSIS — R937 Abnormal findings on diagnostic imaging of other parts of musculoskeletal system: Secondary | ICD-10-CM | POA: Diagnosis present

## 2022-10-25 DIAGNOSIS — G8929 Other chronic pain: Secondary | ICD-10-CM | POA: Diagnosis present

## 2022-10-25 DIAGNOSIS — M47817 Spondylosis without myelopathy or radiculopathy, lumbosacral region: Secondary | ICD-10-CM | POA: Diagnosis present

## 2022-10-25 DIAGNOSIS — Z91040 Latex allergy status: Secondary | ICD-10-CM | POA: Insufficient documentation

## 2022-10-25 DIAGNOSIS — M5137 Other intervertebral disc degeneration, lumbosacral region: Secondary | ICD-10-CM | POA: Insufficient documentation

## 2022-10-25 MED ORDER — TRIAMCINOLONE ACETONIDE 40 MG/ML IJ SUSP
80.0000 mg | Freq: Once | INTRAMUSCULAR | Status: AC
  Administered 2022-10-25: 80 mg

## 2022-10-25 MED ORDER — MIDAZOLAM HCL 5 MG/5ML IJ SOLN
0.5000 mg | Freq: Once | INTRAMUSCULAR | Status: AC
  Administered 2022-10-25: 2 mg via INTRAVENOUS
  Filled 2022-10-25: qty 2

## 2022-10-25 MED ORDER — PENTAFLUOROPROP-TETRAFLUOROETH EX AERO
INHALATION_SPRAY | Freq: Once | CUTANEOUS | Status: AC
  Administered 2022-10-25: 30 via TOPICAL
  Filled 2022-10-25: qty 116

## 2022-10-25 MED ORDER — ROPIVACAINE HCL 2 MG/ML IJ SOLN
18.0000 mL | Freq: Once | INTRAMUSCULAR | Status: AC
  Administered 2022-10-25: 18 mL via PERINEURAL

## 2022-10-25 MED ORDER — LIDOCAINE HCL 2 % IJ SOLN
20.0000 mL | Freq: Once | INTRAMUSCULAR | Status: AC
  Administered 2022-10-25: 400 mg

## 2022-10-25 MED ORDER — LACTATED RINGERS IV SOLN: Freq: Once | INTRAVENOUS | Status: AC

## 2022-10-25 MED ORDER — FENTANYL CITRATE (PF) 100 MCG/2ML IJ SOLN: 25.0000 ug | INTRAMUSCULAR | Status: DC | PRN

## 2022-10-25 NOTE — Patient Instructions (Signed)

## 2022-10-25 NOTE — Progress Notes (Signed)
Safety precautions to be maintained throughout the outpatient stay will include: orient to surroundings, keep bed in low position, maintain call bell within reach at all times, provide assistance with transfer out of bed and ambulation.  

## 2022-10-26 ENCOUNTER — Telehealth: Payer: Self-pay

## 2022-10-26 NOTE — Telephone Encounter (Signed)
Post procedure follow up.  LM 

## 2022-10-31 ENCOUNTER — Telehealth: Payer: Self-pay | Admitting: Pain Medicine

## 2022-10-31 NOTE — Telephone Encounter (Signed)
PT stated that she had procedure things has gotten worse.  PT stated that her arms and legs are hurting. Please give patient a call. Thanks

## 2022-10-31 NOTE — Telephone Encounter (Signed)
Explained to patient that she had to give her procedure at least 2 weeks for the steroid to start working. Denies fever, progressive weakness or difficulty with bowels or bladder.  Instructed patient to call back for any further questions or concerns.

## 2022-11-08 ENCOUNTER — Ambulatory Visit: Payer: Medicaid Other | Admitting: Pain Medicine

## 2022-11-13 ENCOUNTER — Ambulatory Visit: Payer: Medicaid Other | Admitting: Pain Medicine

## 2022-11-16 ENCOUNTER — Telehealth: Payer: Self-pay | Admitting: Pain Medicine

## 2022-11-16 NOTE — Telephone Encounter (Signed)
PA has been done.  Waiting on response.

## 2022-11-16 NOTE — Telephone Encounter (Signed)
PT stated that she went to pharmacy to get prescription and was told that an PA needed to be send in. Please give patient a call. Thanks

## 2022-11-19 NOTE — Telephone Encounter (Signed)
This was done per KT on 11/14/2022 via [facility].

## 2022-11-19 NOTE — Telephone Encounter (Signed)
Oxycodone needs PA.

## 2022-12-03 NOTE — Progress Notes (Unsigned)
PROVIDER NOTE: Information contained herein reflects review and annotations entered in association with encounter. Interpretation of such information and data should be left to medically-trained personnel. Information provided to patient can be located elsewhere in the medical record under "Patient Instructions". Document created using STT-dictation technology, any transcriptional errors that may result from process are unintentional.    Patient: Shannon Benitez  Service Category: E/M  Provider: [name], MD  DOB: Mar 16, 1971  DOS: 12/05/2022  Referring Provider: Danae Orleans, MD  MRN: [id number]  Specialty: Interventional Pain Management  PCP: Danae Orleans, MD  Type: Established Patient  Setting: Ambulatory outpatient    Location: Office  Delivery: Face-to-face     HPI  Ms. Hollye Ambrosino, a 52 y.o. year old female, is here today because of her No primary diagnosis found.. Ms. Faro primary complain today is No chief complaint on file. Last encounter: My last encounter with her was on 11/16/2022. Pertinent problems: Ms. Heinrich has Multiple sclerosis ([place]); Neurogenic pain; Chronic sacroiliac joint pain (Bilateral) (L>R); Chronic pain syndrome; Chronic low back pain (1ry area of Pain) (Bilateral) (L>R) w/ sciatica (Bilateral); Chronic lower extremity pain (2ry area of Pain) (Bilateral) (R>L); Chronic low back pain (Bilateral) (L>R) w/o sciatica; Muscle spasticity; Spondylosis without myelopathy or radiculopathy, lumbosacral region; Lumbar facet syndrome (Bilateral) (R>L); Abnormal MRI, lumbar spine (06/16/2018); Lumbar facet hypertrophy (Multilevel) (Bilateral); Osteoarthritis of facet joint of lumbar spine; Osteoarthritis of lumbar spine; DDD (degenerative disc disease), lumbosacral; Lumbar spondylosis; Diabetic peripheral neuropathy ([place]); 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 Pain Assessment: Severity of   is reported as a  /10. Location:    / . Onset:  . Quality:  . Timing:  . Modifying factor(s):  Marland Kitchen Vitals:  vitals were not taken for this visit.  BMI: Estimated body mass index is 43.84 kg/m as calculated from the following:   Height as of 10/25/22: '5\' 1"'$  (1.549 m).   Weight as of 10/25/22: 232 lb (105.2 kg).  Reason for encounter: medication management. ***  RTCB: 03/14/2023  Nonopioids transfer 08/10/2020: Mobic, baclofen, and Lyrica.  Pharmacotherapy Assessment  Analgesic: Oxycodone IR 5 mg, 1 tab PO QID from an initial BID (10 mg/day of oxycodone) (now at 20 mg/day) MME/day: 30 mg/day.   Monitoring: [place] PMP: PDMP reviewed during this encounter.       Pharmacotherapy: No side-effects or adverse reactions reported. Compliance: No problems identified. Effectiveness: Clinically acceptable.  No notes on file  No results found for: "CBDTHCR" No results found for: "D8THCCBX" No results found for: "D9THCCBX"  UDS:  Summary  Date Value Ref Range Status  01/03/2022 Note  Final    Comment:    ==================================================================== [employer] (MW) ==================================================================== Test                             Result       Flag       Units  Drug Present and Declared for Prescription Verification   Oxycodone  739          EXPECTED   ng/mg creat   Noroxycodone                   4595         EXPECTED    ng/mg creat    Sources of oxycodone include scheduled prescription medications.    Noroxycodone is an expected metabolite of oxycodone.    Phenobarbital                  PRESENT      EXPECTED    Phenobarbital is an expected metabolite of primidone; Phenobarbital    may also be administered as a prescription drug.  ==================================================================== Test                      Result    Flag   Units      Ref Range   Creatinine              144              mg/dL      >=20 ==================================================================== Declared Medications:  The flagging and interpretation on this report are based on the  following declared medications.  Unexpected results may arise from  inaccuracies in the declared medications.   **Note: The testing scope of this panel includes these medications:   Oxycodone  Primidone (Mysoline)   **Note: The testing scope of this panel does not include the  following reported medications:   Acetaminophen (Excedrin)  Aspirin (Excedrin)  Baclofen (Lioresal)  Caffeine (Excedrin)  Dimethyl Fumarate  Duloxetine (Cymbalta)  Lamotrigine (Lamictal)  Levetiracetam (Keppra)  Loperamide  Meloxicam (Mobic)  Metformin  Mirabegron  Naloxone (Narcan)  Omeprazole (Prilosec)  Pregabalin (Lyrica)  Propranolol (Inderal)  Sertraline (Zoloft)  Sitagliptin (Januvia) ==================================================================== For clinical consultation, please call [phone]. ====================================================================       ROS  Constitutional: Denies any fever or chills Gastrointestinal: No reported hemesis, hematochezia, vomiting, or acute GI distress Musculoskeletal: Denies any acute onset joint swelling, redness, loss of ROM, or weakness Neurological: No reported episodes of acute onset apraxia, aphasia, dysarthria, agnosia, amnesia, paralysis, loss of coordination, or  loss of consciousness  Medication Review  Dimethyl Fumarate, aspirin-acetaminophen-caffeine, baclofen, lamoTRIgine, levETIRAcetam, loperamide, meloxicam, metFORMIN, mirabegron ER, naloxone, omeprazole, oxyCODONE, pregabalin, primidone, propranolol, sertraline, and sitaGLIPtin  History Review  Allergy: Ms. Kunst is allergic to 5-alpha reductase inhibitors, desvenlafaxine, glatiramer, rebif [interferon beta-1a], gramineae pollens, and latex. Drug: Ms. Huffine  has no history on file for drug use. Alcohol:  reports no history of alcohol use. Tobacco:  reports that she quit smoking about 5 years ago. Her smoking use included cigarettes. She has a 9.00 pack-year smoking history. She has quit using smokeless tobacco. Social: Ms. Marinez  reports that she quit smoking about 5 years ago. Her smoking use included cigarettes. She has a 9.00 pack-year smoking history. She has quit using smokeless tobacco. She reports that she does not drink alcohol. Medical:  has a past medical history of Acute postoperative pain (04/30/2019), Diabetes mellitus without complication ([place]), GERD (gastroesophageal reflux disease), Headache, MS (multiple sclerosis) (HCC), Numbness and tingling of both legs below knees, Osteoarthritis, Vertigo, and Wears dentures. Surgical: Ms. Winiecki  has a past surgical history that includes Abdominal hysterectomy; Cesarean section; Tumor removal (Left); Cataract extraction w/PHACO (Right, 03/18/2019); and Cataract extraction w/PHACO (Left, 04/13/2019). Family: family history is not on file. She was adopted.  Laboratory Chemistry Profile  Renal Lab Results  Component Value Date   BUN 19 10/01/2022   CREATININE 0.59 10/01/2022   BCR 13 11/27/2018   GFRAA >60 09/25/2019   GFRNONAA >60 10/01/2022    Hepatic Lab Results  Component Value Date   AST 18 10/01/2022   ALT 14 10/01/2022   ALBUMIN 4.0 10/01/2022   ALKPHOS 91 10/01/2022    Electrolytes Lab Results  Component  Value Date   NA 138 10/01/2022   K 3.4 (L) 10/01/2022   CL 103 10/01/2022   CALCIUM 9.7 10/01/2022   MG 2.2 11/27/2018    Bone Lab Results  Component Value Date   25OHVITD1 55 11/27/2018   25OHVITD2 <1.0 11/27/2018   25OHVITD3 55 11/27/2018    Inflammation (CRP: Acute Phase) (ESR: Chronic Phase) Lab Results  Component Value Date   CRP 10 11/27/2018   ESRSEDRATE 42 (H) 11/27/2018         Note: Above Lab results reviewed.  Recent Imaging Review  DG PAIN CLINIC C-ARM 1-60 MIN NO REPORT Fluoro was used, but no Radiologist interpretation will be provided.  Please refer to "NOTES" tab for provider progress note. Note: Reviewed        Physical Exam  General appearance: Well nourished, well developed, and well hydrated. In no apparent acute distress Mental status: Alert, oriented x 3 (person, place, & time)       Respiratory: No evidence of acute respiratory distress Eyes: PERLA Vitals: There were no vitals taken for this visit. BMI: Estimated body mass index is 43.84 kg/m as calculated from the following:   Height as of 10/25/22: '5\' 1"'$  (1.549 m).   Weight as of 10/25/22: 232 lb (105.2 kg). Ideal: Patient weight not recorded  Assessment   Diagnosis Status  1. Multiple sclerosis ([place])   2. Chronic pain syndrome   3. Pharmacologic therapy   4. DDD (degenerative disc disease), lumbosacral   5. Chronic sacroiliac joint pain (Bilateral) (L>R)   6. Encounter for medication management   7. Encounter for chronic pain management   8. Chronic use of opiate for therapeutic purpose   9. Lumbar facet syndrome (Bilateral) (R>L)   10. Chronic knee pain (Bilateral)   11. Chronic lower extremity pain (2ry area of Pain) (Bilateral) (R>L)   12. Chronic low back pain (1ry area of Pain) (Bilateral) (L>R) w/ sciatica (Bilateral)   13. Chronic hip pain (Bilateral)    Controlled Controlled Controlled   Updated Problems: No problems updated.  Plan of Care  Problem-specific:  No  problem-specific Assessment & Plan notes found for this encounter.  Ms. Jani Moronta has a current medication list which includes the following long-term medication(s): baclofen, baclofen, lamotrigine, levetiracetam, levetiracetam, meloxicam, meloxicam, metformin, naloxone, omeprazole, omeprazole, oxycodone, pregabalin, pregabalin, primidone, primidone, propranolol, propranolol, sitagliptin, and sitagliptin.  Pharmacotherapy (Medications Ordered): No orders of the defined types were placed in this encounter.  Orders:  No orders of the defined types were placed in this encounter.  Follow-up plan:   No follow-ups on file.     Interventional Therapies  Risk  Complexity Considerations:   Estimated body mass index is 43.84 kg/m as calculated from the following:   Height as of this encounter: '5\' 1"'$  (1.549 m).   Weight as of this encounter: 232 lb (105.2 kg). NOTE: NO RFA until BMI is <30 Continue encouraging the patient to bring her BMI below 35.   Planned  Pending:   Therapeutic bilateral lumbar facet MBB #7 (#3 of 2023) (10/04/2022) NO-SHOW to pain management  appointment for procedure: Bilateral lumbar facet block.  Did not call to cancel.   Under consideration:   Diagnostic bilateral T11-12 lumbar facet block   Diagnostic right T7-8 TESI   Diagnostic right T7 TFESI   Diagnostic ML CESI   Possible candidate for intrathecal pump trial and implant    Completed:   Palliative bilateral lumbar facet MBB x7 (10/25/2022) (100/95/95) Therapeutic left lumbar facet RFA x3 (05/24/2020)  Therapeutic right lumbar facet RFA x3 (04/07/2020)  (08/10/2020) referral to bariatric surgery and medical weight management    Completed by other providers:   Therapeutic/diagnostic bilateral SI Blk x1 (09/05/2021) by Reece Leader, MD (PMR)  Diagnostic/therapeutic bilateral L4/5 TFESI x1 (09/12/2017) by Barnet Glasgow, MD [facility])  Diagnostic/therapeutic bilateral SI Blk x1 (01/30/2017) by  Barnet Glasgow, MD [facility])    Therapeutic  Palliative (PRN) options:   Palliative bilateral lumbar facet MBB   Palliative left lumbar facet RFA  Palliative right lumbar facet RFA     Pharmacotherapy  Nonopioids transfer 08/10/2020: Mobic, baclofen, and Lyrica.      Recent Visits Date Type Provider Dept  10/25/22 Procedure visit Milinda Pointer, MD [facility]  Showing recent visits within past 90 days and meeting all other requirements Future Appointments Date Type Provider Dept  12/05/22 Appointment Milinda Pointer, MD [facility]  Showing future appointments within next 90 days and meeting all other requirements  I discussed the assessment and treatment plan with the patient. The patient was provided an opportunity to ask questions and all were answered. The patient agreed with the plan and demonstrated an understanding of the instructions.  Patient advised to call back or seek an in-person evaluation if the symptoms or condition worsens.  Duration of encounter: *** minutes.  Total time on encounter, as per AMA guidelines included both the face-to-face and non-face-to-face time personally spent by the physician and/or other qualified health care professional(s) on the day of the encounter (includes time in activities that require the physician or other qualified health care professional and does not include time in activities normally performed by clinical staff). Physician's time may include the following activities when performed: Preparing to see the patient (e.g., pre-charting review of records, searching for previously ordered imaging, lab work, and nerve conduction tests) Review of prior analgesic pharmacotherapies. Reviewing PMP Interpreting ordered tests (e.g., lab work, imaging, nerve conduction tests) Performing post-procedure evaluations, including interpretation of diagnostic procedures Obtaining and/or reviewing separately obtained  history Performing a medically appropriate examination and/or evaluation Counseling and educating the patient/family/caregiver Ordering medications, tests, or procedures Referring and communicating with other health care professionals (when not separately reported) Documenting clinical information in the electronic or other health record Independently interpreting results (not separately reported) and communicating results to the patient/ family/caregiver Care coordination (not separately reported)  Note by: [name], MD Date: 12/05/2022; Time: 12:44 PM

## 2022-12-05 ENCOUNTER — Ambulatory Visit: Payer: Medicaid Other | Attending: Pain Medicine | Admitting: Pain Medicine

## 2022-12-05 ENCOUNTER — Encounter: Payer: Self-pay | Admitting: Pain Medicine

## 2022-12-05 VITALS — BP 111/77 | HR 76 | Temp 97.4°F | Resp 16 | Ht 61.0 in | Wt 233.0 lb

## 2022-12-05 DIAGNOSIS — M47816 Spondylosis without myelopathy or radiculopathy, lumbar region: Secondary | ICD-10-CM | POA: Insufficient documentation

## 2022-12-05 DIAGNOSIS — M25561 Pain in right knee: Secondary | ICD-10-CM | POA: Diagnosis present

## 2022-12-05 DIAGNOSIS — M533 Sacrococcygeal disorders, not elsewhere classified: Secondary | ICD-10-CM | POA: Diagnosis present

## 2022-12-05 DIAGNOSIS — M25562 Pain in left knee: Secondary | ICD-10-CM | POA: Insufficient documentation

## 2022-12-05 DIAGNOSIS — G8929 Other chronic pain: Secondary | ICD-10-CM | POA: Insufficient documentation

## 2022-12-05 DIAGNOSIS — M5137 Other intervertebral disc degeneration, lumbosacral region: Secondary | ICD-10-CM | POA: Insufficient documentation

## 2022-12-05 DIAGNOSIS — M5459 Other low back pain: Secondary | ICD-10-CM | POA: Insufficient documentation

## 2022-12-05 DIAGNOSIS — M79605 Pain in left leg: Secondary | ICD-10-CM

## 2022-12-05 DIAGNOSIS — M5442 Lumbago with sciatica, left side: Secondary | ICD-10-CM | POA: Diagnosis not present

## 2022-12-05 DIAGNOSIS — Z79899 Other long term (current) drug therapy: Secondary | ICD-10-CM | POA: Diagnosis present

## 2022-12-05 DIAGNOSIS — M25551 Pain in right hip: Secondary | ICD-10-CM | POA: Insufficient documentation

## 2022-12-05 DIAGNOSIS — M25552 Pain in left hip: Secondary | ICD-10-CM | POA: Diagnosis present

## 2022-12-05 DIAGNOSIS — G894 Chronic pain syndrome: Secondary | ICD-10-CM | POA: Insufficient documentation

## 2022-12-05 DIAGNOSIS — M7918 Myalgia, other site: Secondary | ICD-10-CM

## 2022-12-05 DIAGNOSIS — Z79891 Long term (current) use of opiate analgesic: Secondary | ICD-10-CM | POA: Diagnosis present

## 2022-12-05 DIAGNOSIS — M5441 Lumbago with sciatica, right side: Secondary | ICD-10-CM | POA: Diagnosis present

## 2022-12-05 DIAGNOSIS — M79604 Pain in right leg: Secondary | ICD-10-CM | POA: Insufficient documentation

## 2022-12-05 DIAGNOSIS — G35 Multiple sclerosis: Secondary | ICD-10-CM | POA: Diagnosis present

## 2022-12-05 MED ORDER — OXYCODONE HCL 5 MG PO TABS: 5.0000 mg | ORAL_TABLET | Freq: Four times a day (QID) | ORAL | 0 refills | Status: DC | PRN

## 2022-12-05 NOTE — Patient Instructions (Signed)
____________________________________________________________________________________________  Opioid Pain Medication Update  To: All patients taking opioid pain medications. (I.e.: hydrocodone, hydromorphone, oxycodone, oxymorphone, morphine, codeine, methadone, tapentadol, tramadol, buprenorphine, fentanyl, etc.)  Re: Updated review of side effects and adverse reactions of opioid analgesics, as well as new information about long term effects of this class of medications.  Direct risks of long-term opioid therapy are not limited to opioid addiction and overdose. Potential medical risks include serious fractures, breathing problems during sleep, hyperalgesia, immunosuppression, chronic constipation, bowel obstruction, myocardial infarction, and tooth decay secondary to xerostomia.  Unpredictable adverse effects that can occur even if you take your medication correctly: Cognitive impairment, respiratory depression, and death. Most people think that if they take their medication "correctly", and "as instructed", that they will be safe. Nothing could be farther from the truth. In reality, a significant amount of recorded deaths associated with the use of opioids has occurred in individuals that had taken the medication for a long time, and were taking their medication correctly. The following are examples of how this can happen: Patient taking his/her medication for a long time, as instructed, without any side effects, is given a certain antibiotic or another unrelated medication, which in turn triggers a "Drug-to-drug interaction" leading to disorientation, cognitive impairment, impaired reflexes, respiratory depression or an untoward event leading to serious bodily harm or injury, including death.  Patient taking his/her medication for a long time, as instructed, without any side effects, develops an acute impairment of liver and/or kidney function. This will lead to a rapid inability of the body to  breakdown and eliminate their pain medication, which will result in effects similar to an "overdose", but with the same medicine and dose that they had always taken. This again may lead to disorientation, cognitive impairment, impaired reflexes, respiratory depression or an untoward event leading to serious bodily harm or injury, including death.  A similar problem will occur with patients as they grow older and their liver and kidney function begins to decrease as part of the aging process.  Background information: Historically, the original case for using long-term opioid therapy to treat chronic noncancer pain was based on safety assumptions that subsequent experience has called into question. In 1996, the American Pain Society and the [place] [employer] issued a consensus statement supporting long-term opioid therapy. This statement acknowledged the dangers of opioid prescribing but concluded that the risk for addiction was low; respiratory depression induced by opioids was short-lived, occurred mainly in opioid-naive patients, and was antagonized by pain; tolerance was not a common problem; and efforts to control diversion should not constrain opioid prescribing. This has now proven to be wrong. Experience regarding the risks for opioid addiction, misuse, and overdose in community practice has failed to support these assumptions.  According to the Centers for Disease Control and Prevention, fatal overdoses involving opioid analgesics have increased sharply over the past decade. Currently, more than 96,700 people die from drug overdoses every year. Opioids are a factor in 7 out of every 10 overdose deaths. Deaths from drug overdose have surpassed motor vehicle accidents as the leading cause of death for individuals between the ages of 3 and 36.  Clinical data suggest that neuroendocrine dysfunction may be very common in both men and women, potentially causing hypogonadism, erectile  dysfunction, infertility, decreased libido, osteoporosis, and depression. Recent studies linked higher opioid dose to increased opioid-related mortality. Controlled observational studies reported that long-term opioid therapy may be associated with increased risk for cardiovascular events. Subsequent meta-analysis concluded  that the safety of long-term opioid therapy in elderly patients has not been proven.   Side Effects and adverse reactions: Common side effects: Drowsiness (sedation). Dizziness. Nausea and vomiting. Constipation. Physical dependence -- Dependence often manifests with withdrawal symptoms when opioids are discontinued or decreased. Tolerance -- As you take repeated doses of opioids, you require increased medication to experience the same effect of pain relief. Respiratory depression -- This can occur in healthy people, especially with higher doses. However, people with COPD, asthma or other lung conditions may be even more susceptible to fatal respiratory impairment.  Uncommon side effects: An increased sensitivity to feeling pain and extreme response to pain (hyperalgesia). Chronic use of opioids can lead to this. Delayed gastric emptying (the process by which the contents of your stomach are moved into your small intestine). Muscle rigidity. Immune system and hormonal dysfunction. Quick, involuntary muscle jerks (myoclonus). Arrhythmia. Itchy skin (pruritus). Dry mouth (xerostomia).  Long-term side effects: Chronic constipation. Sleep-disordered breathing (SDB). Increased risk of bone fractures. Hypothalamic-pituitary-adrenal dysregulation. Increased risk of overdose.  RISKS: Fractures and Falls:  Opioids increase the risk and incidence of falls. This is of particular importance in elderly patients.  Endocrine System:  Long-term administration is associated with endocrine abnormalities. Influences on both the hypothalamic-pituitary-adrenal axis?and the  hypothalamic-pituitary-gonadal axis have been demonstrated with consequent hypogonadism and adrenal insufficiency in both sexes. Hypogonadism and decreased levels of dehydroepiandrosterone sulfate have been reported in men and women. Endocrine effects can lead to: Amenorrhoea in women Reduced libido in both sexes Erectile dysfunction in men Infertility Depression and fatigue Patients (particularly women of childbearing age) should avoid opioids. There is insufficient evidence to recommend routine monitoring of asymptomatic patients taking opioids in the long-term for hormonal deficiencies.  Immune System: Human studies have demonstrated that opioids have an immunomodulating effect. These effects are mediated via opioid receptors both on immune effector cells and in the central nervous system. Opioids have been demonstrated to have adverse effects on antimicrobial response and anti-tumour surveillance. Buprenorphine has been demonstrated to have no impact on immune function.  Opioid Induced Hyperalgesia: Human studies have demonstrated that prolonged use of opioids can lead to a state of abnormal pain sensitivity, sometimes called opioid induced hyperalgesia (OIH). Opioid induced hyperalgesia is not usually seen in the absence of tolerance to opioid analgesia. Clinically, hyperalgesia may be diagnosed if the patient on long-term opioid therapy presents with increased pain. This might be qualitatively and anatomically distinct from pain related to disease progression or to breakthrough pain resulting from development of opioid tolerance. Pain associated with hyperalgesia tends to be more diffuse than the pre-existing pain and less defined in quality. Management of opioid induced hyperalgesia requires opioid dose reduction.  Cancer: Chronic opioid therapy has been associated with an increased risk of cancer among noncancer patients with chronic pain. This association was more evident in chronic  strong opioid users. Chronic opioid consumption causes significant pathological changes in the small intestine and colon. Epidemiological studies have found that there is a link between opium dependence and initiation of gastrointestinal cancers. Cancer is the second leading cause of death after cardiovascular disease. Chronic use of opioids can cause multiple conditions such as GERD, immunosuppression and renal damage as well as carcinogenic effects, which are associated with the incidence of cancers.   Mortality: Long-term opioid use has been associated with increased mortality among patients with chronic non-cancer pain (CNCP).  Prescription of long-acting opioids for chronic noncancer pain was associated with a significantly increased risk of all-cause mortality,  including deaths from causes other than overdose.  Reference: Von Korff M, Kolodny A, Deyo RA, Chou R. Long-term opioid therapy reconsidered. Ann Intern Med. 2011 Sep 6;155(5):325-8. doi: 10.7326/[id number]. PMID: 46962952; PMCID: [id number]. Morley Kos, Hayward RA, Dunn KM, [place] KP. Risk of adverse events in patients prescribed long-term opioids: A cohort study in the [place] Clinical Practice Research D[employer]. Eur J Pain. 2019 May;23(5):908-922. doi: 10.1002/ejp.1357. Epub 2019 Jan 31. PMID: 27253664. Colameco S, Coren JS, Ciervo CA. Continuous opioid treatment for chronic noncancer pain: a time for moderation in prescribing. Postgrad Med. 2009 Jul;121(4):61-6. doi: 10.3810/pgm.2009.07.2032. PMID: 40347425. Heywood Bene RN, [place] SD, Blazina I, Rosalio Loud, Bougatsos C, Deyo RA. The effectiveness and risks of long-term opioid therapy for chronic pain: a systematic review for a [employer] of Health Pathways to [employer]. Ann Intern Med. 2015 Feb 17;162(4):276-86. doi: 95.6387/F64-3329. PMID: 51884166. Marjory Sneddon [facility], [place] DM. NCHS Data Brief No. 22. [place]:  Centers for Disease Control and Prevention; 2009. Sep, Increase in Fatal Poisonings Involving Opioid Analgesics in the [place], 1999-2006. Song IA, Choi HR, Oh TK. Long-term opioid use and mortality in patients with chronic non-cancer pain: Ten-year follow-up study in [place] from 2010 through 2019. EClinicalMedicine. 2022 Jul 18;51:101558. doi: 10.1016/j.eclinm.2022.063016. PMID: 01093235; PMCID: [id number]. Huser, W., Schubert, T., Vogelmann, T. et al. All-cause mortality in patients with long-term opioid therapy compared with non-opioid analgesics for chronic non-cancer pain: a database study. [place] Med 18, 162 (2020). [email or web address].[email or web address].com/ Rashidian H, Roxy Cedar, Malekzadeh R, Haghdoost AA. An Ecological Study of the Association between Opiate Use and Incidence of Cancers. Addict Health. 2016 Fall;8(4):252-260. PMID: 70623762; PMCID: [id number].  Our Goal: Our goal is to control your pain with means other than the use of opioid pain medications.  Our Recommendation: Talk to your physician about coming off of these medications. We can assist you with the tapering down and stopping these medicines. Based on the new information, even if you cannot completely stop the medication, a decrease in the dose may be associated with a lesser risk. Ask for other means of controlling the pain. Decrease or eliminate those factors that significantly contribute to your pain such as smoking, obesity, and a diet heavily tilted towards "inflammatory" nutrients.  ____________________________________________________________________________________________     ____________________________________________________________________________________________  Carron Brazen Pain Medication Shortage  The [place] is experiencing worsening drug shortages. These have had a negative widespread effect on patient care and treatment. Not expected to improve any time soon. Predicted to last past  2029.   Drug shortage list (generic names) Oxycodone IR Oxycodone/APAP Oxymorphone IR Hydromorphone Hydrocodone/APAP Morphine  Where is the problem?  Manufacturing and supply level.  Will this shortage affect you?  Only if you take any of the above pain medications.  How? You may be unable to fill your prescription.  Your pharmacist may offer a "partial fill" of your prescription. (Warning: Do not accept partial fills.) Prescriptions partially filled cannot be transferred to another pharmacy. Read our Medication Rules and Regulation. Depending on how much medicine you are dependent on, you may experience withdrawals when unable to get the medication.  Recommendations: Consider ending your dependence on opioid pain medications. Ask your pain specialist to assist you with the process. Consider switching to a medication currently not in shortage, such as Buprenorphine. Talk to your pain specialist about this option. Consider decreasing your pain medication requirements by managing tolerance thru "Drug Holidays". This may help minimize withdrawals, should you run  out of medicine. Control your pain thru the use of non-pharmacological interventional therapies.   Your prescriber: Prescribers cannot be blamed for shortages. Medication manufacturing and supply issues cannot be fixed by the prescriber.   NOTE: The prescriber is not responsible for supplying the medication, or solving supply issues. Work with your pharmacist to solve it. The patient is responsible for the decision to take or continue taking the medication and for identifying and securing a legal supply source. By law, supplying the medication is the job and responsibility of the pharmacy. The prescriber is responsible for the evaluation, monitoring, and prescribing of these medications.   Prescribers will NOT: Re-issue prescriptions that have been partially filled. Re-issue prescriptions already sent to a pharmacy.  Re-send  prescriptions to a different pharmacy because yours did not have your medication. Ask pharmacist to order more medicine or transfer the prescription to another pharmacy. (Read below.)  New 2023 regulation: "July 06, 2022 Revised Regulation Allows DEA-Registered Pharmacies to Transfer Electronic Prescriptions at a Patient's Request [place] Patients now have the ability to request their electronic prescription be transferred to another pharmacy without having to go back to their practitioner to initiate the request. This revised regulation went into effect on Monday, July 02, 2022.     At a patient's request, a DEA-registered retail pharmacy can now transfer an electronic prescription for a controlled substance (schedules II-V) to another DEA-registered retail pharmacy. Prior to this change, patients would have to go through their practitioner to cancel their prescription and have it re-issued to a different pharmacy. The process was taxing and time consuming for both patients and practitioners.    The Drug Enforcement Administration [facility]) published its intent to revise the process for transferring electronic prescriptions on September 23, 2020.  The final rule was published in the federal register on May 31, 2022 and went into effect 30 days later.  Under the final rule, a prescription can only be transferred once between pharmacies, and only if allowed under existing state or other applicable law. The prescription must remain in its electronic form; may not be altered in any way; and the transfer must be communicated directly between two licensed pharmacists. It's important to note, any authorized refills transfer with the original prescription, which means the entire prescription will be filled at the same pharmacy".  Reference: [email or web address] [facility]  website announcement)  [email or web address] ([place])   [employer] / Vol. 88, No. 143 / Thursday, May 31, 2022 / Rules and Regulations DEPARTMENT OF JUSTICE  Drug Enforcement Administration  21 CFR Part 1306  [Docket No. [id number]]  RIN [id number] Transfer of Electronic Prescriptions for Schedules II-V Controlled Substances Between Pharmacies for Initial Filling  ____________________________________________________________________________________________     _______________________________________________________________________  Medication Rules  Purpose: To inform patients, and their family members, of our medication rules and regulations.  Applies to: All patients receiving prescriptions from our practice (written or electronic).  Pharmacy of record: This is the pharmacy where your electronic prescriptions will be sent. Make sure we have the correct one.  Electronic prescriptions: In compliance with the [place] (STOP) Act of 2017 (Session Lanny Cramp [phone]), effective November 05, 2018, all controlled substances must be electronically prescribed. Written prescriptions, faxing, or calling prescriptions to a pharmacy will no longer be done.  Prescription refills: These will be provided only during in-person appointments. No medications will be renewed without a "face-to-face" evaluation with your provider. Applies to  all prescriptions.  NOTE: The following applies primarily to controlled substances (Opioid* Pain Medications).   Type of encounter (visit): For patients receiving controlled substances, face-to-face visits are required. (Not an option and not up to the patient.)  Patient's responsibilities: Pain Pills: Bring all pain pills to every appointment (except for procedure appointments). Pill Bottles: Bring pills in original pharmacy bottle. Bring  bottle, even if empty. Always bring the bottle of the most recent fill.  Medication refills: You are responsible for knowing and keeping track of what medications you are taking and when is it that you will need a refill. The day before your appointment: write a list of all prescriptions that need to be refilled. The day of the appointment: give the list to the admitting nurse. Prescriptions will be written only during appointments. No prescriptions will be written on procedure days. If you forget a medication: it will not be "Called in", "Faxed", or "electronically sent". You will need to get another appointment to get these prescribed. No early refills. Do not call asking to have your prescription filled early. Partial  or short prescriptions: Occasionally your pharmacy may not have enough pills to fill your prescription.  NEVER ACCEPT a partial fill or a prescription that is short of the total amount of pills that you were prescribed.  With controlled substances the law allows 72 hours for the pharmacy to complete the prescription.  If the prescription is not completed within 72 hours, the pharmacist will require a new prescription to be written. This means that you will be short on your medicine and we WILL NOT send another prescription to complete your original prescription.  Instead, request the pharmacy to send a carrier to a nearby branch to get enough medication to provide you with your full prescription. Prescription Accuracy: You are responsible for carefully inspecting your prescriptions before leaving our office. Have the discharge nurse carefully go over each prescription with you, before taking them home. Make sure that your name is accurately spelled, that your address is correct. Check the name and dose of your medication to make sure it is accurate. Check the number of pills, and the written instructions to make sure they are clear and accurate. Make sure that you are given enough medication  to last until your next medication refill appointment. Taking Medication: Take medication as prescribed. When it comes to controlled substances, taking less pills or less frequently than prescribed is permitted and encouraged. Never take more pills than instructed. Never take the medication more frequently than prescribed.  Inform other Doctors: Always inform, all of your healthcare providers, of all the medications you take. Pain Medication from other Providers: You are not allowed to accept any additional pain medication from any other Doctor or Healthcare provider. There are two exceptions to this rule. (see below) In the event that you require additional pain medication, you are responsible for notifying us, as stated below. Cough Medicine: Often these contain an opioid, such as codeine or hydrocodone. Never accept or take cough medicine containing these opioids if you are already taking an opioid* medication. The combination may cause respiratory failure and death. Medication Agreement: You are responsible for carefully reading and following our Medication Agreement. This must be signed before receiving any prescriptions from our practice. Safely store a copy of your signed Agreement. Violations to the Agreement will result in no further prescriptions. (Additional copies of our Medication Agreement are available upon request.) Laws, Rules, & Regulations: All patients are expected to follow  all Federal and [employer], Rules, & Regulations. Ignorance of the Laws does not constitute a valid excuse.  Illegal drugs and Controlled Substances: The use of illegal substances (including, but not limited to marijuana and its derivatives) and/or the illegal use of any controlled substances is strictly prohibited. Violation of this rule may result in the immediate and permanent discontinuation of any and all prescriptions being written by our practice. The use of any illegal substances is  prohibited. Adopted CDC guidelines & recommendations: Target dosing levels will be at or below 60 MME/day. Use of benzodiazepines** is not recommended.  Exceptions: There are only two exceptions to the rule of not receiving pain medications from other Healthcare Providers. Exception #1 (Emergencies): In the event of an emergency (i.e.: accident requiring emergency care), you are allowed to receive additional pain medication. However, you are responsible for: As soon as you are able, call our office [phone], at any time of the day or night, and leave a message stating your name, the date and nature of the emergency, and the name and dose of the medication prescribed. In the event that your call is answered by a member of our staff, make sure to document and save the date, time, and the name of the person that took your information.  Exception #2 (Planned Surgery): In the event that you are scheduled by another doctor or dentist to have any type of surgery or procedure, you are allowed (for a period no longer than 30 days), to receive additional pain medication, for the acute post-op pain. However, in this case, you are responsible for picking up a copy of our "Post-op Pain Management for Surgeons" handout, and giving it to your surgeon or dentist. This document is available at our office, and does not require an appointment to obtain it. Simply go to our office during business hours (Monday-Thursday from 8:00 AM to 4:00 PM) (Friday 8:00 AM to 12:00 Noon) or if you have a scheduled appointment with [place], prior to your surgery, and ask for it by name. In addition, you are responsible for: calling our office [phone], at any time of the day or night, and leaving a message stating your name, name of your surgeon, type of surgery, and date of procedure or surgery. Failure to comply with your responsibilities may result in termination of therapy involving the controlled substances. Medication Agreement  Violation. Following the above rules, including your responsibilities will help you in avoiding a Medication Agreement Violation ("Breaking your Pain Medication Contract").  Consequences:  Not following the above rules may result in permanent discontinuation of medication prescription therapy.  *Opioid medications include: morphine, codeine, oxycodone, oxymorphone, hydrocodone, hydromorphone, meperidine, tramadol, tapentadol, buprenorphine, fentanyl, methadone. **Benzodiazepine medications include: diazepam (Valium), alprazolam (Xanax), clonazepam (Klonopine), lorazepam (Ativan), clorazepate (Tranxene), chlordiazepoxide (Librium), estazolam (Prosom), oxazepam (Serax), temazepam (Restoril), triazolam (Halcion) (Last updated: 08/28/2022) ______________________________________________________________________    ______________________________________________________________________  Medication Recommendations and Reminders  Applies to: All patients receiving prescriptions (written and/or electronic).  Medication Rules & Regulations: You are responsible for reading, knowing, and following our "Medication Rules" document. These exist for your safety and that of others. They are not flexible and neither are we. Dismissing or ignoring them is an act of "non-compliance" that may result in complete and irreversible termination of such medication therapy. For safety reasons, "non-compliance" will not be tolerated. As with the [place] fundamental legal principle of "ignorance of the law is no defense", we will accept no excuses for not having read and  knowing the content of documents provided to you by our practice.  Pharmacy of record:  Definition: This is the pharmacy where your electronic prescriptions will be sent.  We do not endorse any particular pharmacy. It is up to you and your insurance to decide what pharmacy to use.  We do not restrict you in your choice of pharmacy. However, once we write for  your prescriptions, we will NOT be re-sending more prescriptions to fix restricted supply problems created by your pharmacy, or your insurance.  The pharmacy listed in the electronic medical record should be the one where you want electronic prescriptions to be sent. If you choose to change pharmacy, simply notify our nursing staff. Changes will be made only during your regular appointments and not over the phone.  Recommendations: Keep all of your pain medications in a safe place, under lock and key, even if you live alone. We will NOT replace lost, stolen, or damaged medication. We do not accept "Police Reports" as proof of medications having been stolen. After you fill your prescription, take 1 week's worth of pills and put them away in a safe place. You should keep a separate, properly labeled bottle for this purpose. The remainder should be kept in the original bottle. Use this as your primary supply, until it runs out. Once it's gone, then you know that you have 1 week's worth of medicine, and it is time to come in for a prescription refill. If you do this correctly, it is unlikely that you will ever run out of medicine. To make sure that the above recommendation works, it is very important that you make sure your medication refill appointments are scheduled at least 1 week before you run out of medicine. To do this in an effective manner, make sure that you do not leave the office without scheduling your next medication management appointment. Always ask the nursing staff to show you in your prescription , when your medication will be running out. Then arrange for the receptionist to get you a return appointment, at least 7 days before you run out of medicine. Do not wait until you have 1 or 2 pills left, to come in. This is very poor planning and does not take into consideration that we may need to cancel appointments due to bad weather, sickness, or emergencies affecting our staff. DO NOT ACCEPT A  "Partial Fill": If for any reason your pharmacy does not have enough pills/tablets to completely fill or refill your prescription, do not allow for a "partial fill". The law allows the pharmacy to complete that prescription within 72 hours, without requiring a new prescription. If they do not fill the rest of your prescription within those 72 hours, you will need a separate prescription to fill the remaining amount, which we will NOT provide. If the reason for the partial fill is your insurance, you will need to talk to the pharmacist about payment alternatives for the remaining tablets, but again, DO NOT ACCEPT A PARTIAL FILL, unless you can trust your pharmacist to obtain the remainder of the pills within 72 hours.  Prescription refills and/or changes in medication(s):  Prescription refills, and/or changes in dose or medication, will be conducted only during scheduled medication management appointments. (Applies to both, written and electronic prescriptions.) No refills on procedure days. No medication will be changed or started on procedure days. No changes, adjustments, and/or refills will be conducted on a procedure day. Doing so will interfere with the diagnostic portion of  the procedure. No phone refills. No medications will be "called into the pharmacy". No Fax refills. No weekend refills. No Holliday refills. No after hours refills.  Remember:  Business hours are:  Monday to Thursday 8:00 AM to 4:00 PM Provider's Schedule: Milinda Pointer, MD - Appointments are:  Medication management: Monday and Wednesday 8:00 AM to 4:00 PM Procedure day: Tuesday and Thursday 7:30 AM to 4:00 PM Gillis Santa, MD - Appointments are:  Medication management: Tuesday and Thursday 8:00 AM to 4:00 PM Procedure day: Monday and Wednesday 7:30 AM to 4:00 PM (Last update: 08/28/2022) ______________________________________________________________________     ____________________________________________________________________________________________  Drug Holidays  What is a "Drug Holiday"? Drug Holiday: is the name given to the process of slowly tapering down and temporarily stopping the pain medication for the purpose of decreasing or eliminating tolerance to the drug.  Benefits Improved effectiveness Decreased required effective dose Improved pain control End dependence on high dose therapy Decrease cost of therapy Uncovering "opioid-induced hyperalgesia". (OIH)  What is "opioid hyperalgesia"? It is a paradoxical increase in pain caused by exposure to opioids. Stopping the opioid pain medication, contrary to the expected, it actually decreases or completely eliminates the pain. Ref.: "A comprehensive review of opioid-induced hyperalgesia". Brion Aliment, et.al. Pain Physician. 2011 Mar-Apr;14(2):145-61.  What is tolerance? Tolerance: the progressive loss of effectiveness of a pain medicine due to repetitive use. A common problem of opioid pain medications.  How long should a "Drug Holiday" last? Effectiveness depends on the patient staying off all opioid pain medicines for a minimum of 14 consecutive days. (2 weeks)  How about just taking less of the medicine? Does not work. Will not accomplish goal of eliminating the excess receptors.  How about switching to a different pain medicine? (AKA. "Opioid rotation") Does not work. Creates the illusion of effectiveness by taking advantage of inaccurate equivalent dose calculations between different opioids. -This "technique" was promoted by studies funded by [employer], such as [employer], creators of "OxyContin".  Can I stop the medicine "cold [place]"? Depends. You should always coordinate with your Pain Specialist to make the transition as smoothly as possible. Avoid stopping the medicine abruptly without consulting. We recommend a "slow taper".  What is a slow  taper? Taper: refers to the gradual decrease in dose.   How do I stop/taper the dose? Slowly. Decrease the daily amount of pills that you take by one (1) pill every seven (7) days. This is called a "slow downward taper". Example: if you normally take four (4) pills per day, drop it to three (3) pills per day for seven (7) days, then to two (2) pills per day for seven (7) days, then to one (1) per day for seven (7) days, and then stop the medicine. The 14 day "Drug Holiday" starts on the first day without medicine.   Will I experience withdrawals? Unlikely with a slow taper.  What triggers withdrawals? Withdrawals are triggered by the sudden/abrupt stop of high dose opioids. Withdrawals can be avoided by slowly decreasing the dose over a prolonged period of time.  What are withdrawals? Symptoms associated with sudden/abrupt reduction/stopping of high-dose, long-term use of pain medication. Withdrawal are seldom seen on low dose therapy, or patients rarely taking opioid medication.  Early Withdrawal Symptoms may include: Agitation Anxiety Muscle aches Increased tearing Insomnia Runny nose Sweating Yawning  Late symptoms may include: Abdominal cramping Diarrhea Dilated pupils Goose bumps Nausea Vomiting  (Last update: 10/14/2022) ____________________________________________________________________________________________    ____________________________________________________________________________________________  WARNING: CBD (cannabidiol) &  Delta (Delta-8 tetrahydrocannabinol) products.   Applicable to:  All individuals currently taking or considering taking CBD (cannabidiol) and, more important, all patients taking opioid analgesic controlled substances (pain medication). (Example: oxycodone; oxymorphone; hydrocodone; hydromorphone; morphine; methadone; tramadol; tapentadol; fentanyl; buprenorphine; butorphanol; dextromethorphan; meperidine; codeine; etc.)  Introduction:   Recently there has been a drive towards the use of "natural" products for the treatment of different conditions, including pain anxiety and sleep disorders. Marijuana and hemp are two varieties of the cannabis genus plants. Marijuana and its derivatives are illegal, while hemp and its derivatives are not. Cannabidiol (CBD) and tetrahydrocannabinol (THC), are two natural compounds found in plants of the Cannabis genus. They can both be extracted from hemp or marijuana. Both compounds interact with your body's endocannabinoid system in very different ways. CBD is associated with pain relief (analgesia) while THC is associated with the psychoactive effects ("the high") obtained from the use of marijuana products. There are two main types of THC: Delta-9, which comes from the marijuana plant and it is illegal, and Delta-8, which comes from the hemp plant, and it is legal. (Both, Delta-9-THC and Delta-8-THC are psychoactive and give you "the high".)   Legality:  Marijuana and its derivatives: illegal Hemp and its derivatives: Legal (State dependent) UPDATE: (12/22/2021) The Drug Enforcement Agency ([place]) issued a letter stating that "delta" cannabinoids, including Delta-8-THCO and Delta-9-THCO, synthetically derived from hemp do not qualify as hemp and will be viewed as Schedule I drugs. (Schedule I drugs, substances, or chemicals are defined as drugs with no currently accepted medical use and a high potential for abuse. Some examples of Schedule I drugs are: heroin, lysergic acid diethylamide (LSD), marijuana (cannabis), 3,4-methylenedioxymethamphetamine (ecstasy), methaqualone, and peyote.) (https://jennings.com/)  Legal status of CBD in [place]:  "Conditionally Legal"  Reference: "FDA Regulation of Cannabis and Cannabis-Derived Products, Including Cannabidiol (CBD)" - SeekArtists.com.pt  Warning:   CBD is not FDA approved and has not undergo the same manufacturing controls as prescription drugs.  This means that the purity and safety of available CBD may be questionable. Most of the time, despite manufacturer's claims, it is contaminated with THC (delta-9-tetrahydrocannabinol - the chemical in marijuana responsible for the "HIGH").  When this is the case, the [facility] contaminant will trigger a positive urine drug screen (UDS) test for Marijuana (carboxy-THC).   The FDA recently put out a warning about 5 things that everyone should be aware of regarding Delta-8 THC: Delta-8 THC products have not been evaluated or approved by the FDA for safe use and may be marketed in ways that put the public health at risk. The FDA has received adverse event reports involving delta-8 THC-containing products. Delta-8 THC has psychoactive and intoxicating effects. Delta-8 THC manufacturing often involve use of potentially harmful chemicals to create the concentrations of delta-8 THC claimed in the marketplace. The final delta-8 THC product may have potentially harmful by-products (contaminants) due to the chemicals used in the process. Manufacturing of delta-8 THC products may occur in uncontrolled or unsanitary settings, which may lead to the presence of unsafe contaminants or other potentially harmful substances. Delta-8 THC products should be kept out of the reach of children and pets.  NOTE: Because a positive UDS for any illicit substance is a violation of our medication agreement, your opioid analgesics (pain medicine) may be permanently discontinued.  MORE ABOUT CBD  General Information: CBD was discovered in 11 and it is a derivative of the cannabis sativa genus plants (Marijuana and Hemp). It is one of the 113 identified  substances found in Marijuana. It accounts for up to 40% of the plant's extract. As of 2018, preliminary clinical studies on CBD included research for the treatment of anxiety, movement  disorders, and pain. CBD is available and consumed in multiple forms, including inhalation of smoke or vapor, as an aerosol spray, and by mouth. It may be supplied as an oil containing CBD, capsules, dried cannabis, or as a liquid solution. CBD is thought not to be as psychoactive as THC (delta-9-tetrahydrocannabinol - the chemical in marijuana responsible for the "HIGH"). Studies suggest that CBD may interact with different biological target receptors in the body, including cannabinoid and other neurotransmitter receptors. As of 2018 the mechanism of action for its biological effects has not been determined.  Side-effects  Adverse reactions: Dry mouth, diarrhea, decreased appetite, fatigue, drowsiness, malaise, weakness, sleep disturbances, and others.  Drug interactions:  CBD may interact with medications such as blood-thinners. CBD causes drowsiness on its own and it will increase drowsiness caused by other medications, including antihistamines (such as Benadryl), benzodiazepines (Xanax, Ativan, Valium), antipsychotics, antidepressants, opioids, alcohol and supplements such as kava, melatonin and St. John's Wort.  Other drug interactions: Brivaracetam (Briviact); Caffeine; Carbamazepine (Tegretol); Citalopram (Celexa); Clobazam (Onfi); Eslicarbazepine (Aptiom); Everolimus (Zostress); Lithium; Methadone (Dolophine); Rufinamide (Banzel); Sedative medications (CNS depressants); Sirolimus (Rapamune); Stiripentol (Diacomit); Tacrolimus (Prograf); Tamoxifen ; Soltamox); Topiramate (Topamax); Valproate; Warfarin (Coumadin); Zonisamide. (Last update: 10/15/2022) ____________________________________________________________________________________________   ____________________________________________________________________________________________  Naloxone Nasal Spray  Why am I receiving this medication? [place] STOP ACT requires that all patients taking high dose opioids or at risk of opioids  respiratory depression, be prescribed an opioid reversal agent, such as Naloxone (AKA: Narcan).  What is this medication? NALOXONE (nal OX one) treats opioid overdose, which causes slow or shallow breathing, severe drowsiness, or trouble staying awake. Call emergency services after using this medication. You may need additional treatment. Naloxone works by reversing the effects of opioids. It belongs to a group of medications called opioid blockers.  COMMON BRAND NAME(S): Kloxxado, Narcan  What should I tell my care team before I take this medication? They need to know if you have any of these conditions: Heart disease Substance use disorder An unusual or allergic reaction to naloxone, other medications, foods, dyes, or preservatives Pregnant or trying to get pregnant Breast-feeding  When to use this medication? This medication is to be used for the treatment of respiratory depression (less than 8 breaths per minute) secondary to opioid overdose.   How to use this medication? This medication is for use in the nose. Lay the person on their back. Support their neck with your hand and allow the head to tilt back before giving the medication. The nasal spray should be given into 1 nostril. After giving the medication, move the person onto their side. Do not remove or test the nasal spray until ready to use. Get emergency medical help right away after giving the first dose of this medication, even if the person wakes up. You should be familiar with how to recognize the signs and symptoms of a narcotic overdose. If more doses are needed, give the additional dose in the other nostril. Talk to your care team about the use of this medication in children. While this medication may be prescribed for children as young as newborns for selected conditions, precautions do apply.  Naloxone Overdosage: If you think you have taken too much of this medicine contact a poison control center or emergency room at  once.  NOTE: This medicine  is only for you. Do not share this medicine with others.  What if I miss a dose? This does not apply.  What may interact with this medication? This is only used during an emergency. No interactions are expected during emergency use. This list may not describe all possible interactions. Give your health care provider a list of all the medicines, herbs, non-prescription drugs, or dietary supplements you use. Also tell them if you smoke, drink alcohol, or use illegal drugs. Some items may interact with your medicine.  What should I watch for while using this medication? Keep this medication ready for use in the case of an opioid overdose. Make sure that you have the phone number of your care team and local hospital ready. You may need to have additional doses of this medication. Each nasal spray contains a single dose. Some emergencies may require additional doses. After use, bring the treated person to the nearest hospital or call 911. Make sure the treating care team knows that the person has received a dose of this medication. You will receive additional instructions on what to do during and after use of this medication before an emergency occurs.  What side effects may I notice from receiving this medication? Side effects that you should report to your care team as soon as possible: Allergic reactions--skin rash, itching, hives, swelling of the face, lips, tongue, or throat Side effects that usually do not require medical attention (report these to your care team if they continue or are bothersome): Constipation Dryness or irritation inside the nose Headache Increase in blood pressure Muscle spasms Stuffy nose Toothache This list may not describe all possible side effects. Call your doctor for medical advice about side effects. You may report side effects to FDA at 1-800-FDA-1088.  Where should I keep my medication? Because this is an emergency medication, you  should keep it with you at all times.  Keep out of the reach of children and pets. Store between 20 and 25 degrees C (68 and 77 degrees F). Do not freeze. Throw away any unused medication after the expiration date. Keep in original box until ready to use.  NOTE: This sheet is a summary. It may not cover all possible information. If you have questions about this medicine, talk to your doctor, pharmacist, or health care provider.   2023 Elsevier/Gold Standard (2021-06-30 00:00:00)  ____________________________________________________________________________________________   ____________________________________________________________________________________________  Patient Information update  To: All of our patients.  Re: Name change.  It has been made official that our current name, "[facility]"   will soon be changed to "[facility]".   The purpose of this change is to eliminate any confusion created by the concept of our practice being a "Medication Management Pain Clinic". In the past this has led to the misconception that we treat pain primarily by the use of prescription medications.  Nothing can be farther from the truth.   Understanding PAIN MANAGEMENT: To further understand what our practice does, you first have to understand that "Pain Management" is a subspecialty that requires additional training once a physician has completed their specialty training, which can be in either Anesthesia, Neurology, Psychiatry, or Physical Medicine and Rehabilitation (PMR). Each one of these contributes to the final approach taken by each physician to the management of their patient's pain. To be a "Pain Management Specialist" you must have first completed one of the specialty trainings below.  Anesthesiologists -  trained in clinical pharmacology and interventional techniques such as nerve  blockade and regional as well as central neuroanatomy. They are trained to block pain before, during, and after surgical interventions.  Neurologists - trained in the diagnosis and pharmacological treatment of complex neurological conditions, such as Multiple Sclerosis, Parkinson's, spinal cord injuries, and other systemic conditions that may be associated with symptoms that may include but are not limited to pain. They tend to rely primarily on the treatment of chronic pain using prescription medications.  Psychiatrist - trained in conditions affecting the psychosocial wellbeing of patients including but not limited to depression, anxiety, schizophrenia, personality disorders, addiction, and other substance use disorders that may be associated with chronic pain. They tend to rely primarily on the treatment of chronic pain using prescription medications.   Physical Medicine and Rehabilitation (PMR) physicians, also known as physiatrists - trained to treat a wide variety of medical conditions affecting the brain, spinal cord, nerves, bones, joints, ligaments, muscles, and tendons. Their training is primarily aimed at treating patients that have suffered injuries that have caused severe physical impairment. Their training is primarily aimed at the physical therapy and rehabilitation of those patients. They may also work alongside orthopedic surgeons or neurosurgeons using their expertise in assisting surgical patients to recover after their surgeries.  INTERVENTIONAL PAIN MANAGEMENT is sub-subspecialty of Pain Management.  Our physicians are Board-certified in Anesthesia, Pain Management, and Interventional Pain Management.  This meaning that not only have they been trained and Board-certified in their specialty of Anesthesia, and subspecialty of Pain Management, but they have also received further training in the sub-subspecialty of Interventional Pain Management, in order to become Board-certified as  INTERVENTIONAL PAIN MANAGEMENT SPECIALIST.    Mission: Our goal is to use our skills in  [place] as alternatives to the chronic use of prescription opioid medications for the treatment of pain. To make this more clear, we have changed our name to reflect what we do and offer. We will continue to offer medication management assessment and recommendations, but we will not be taking over any patient's medication management.  ____________________________________________________________________________________________

## 2022-12-05 NOTE — Progress Notes (Signed)
Safety precautions to be maintained throughout the outpatient stay will include: orient to surroundings, keep bed in low position, maintain call bell within reach at all times, provide assistance with transfer out of bed and ambulation.   Did not bring medications with her today.

## 2022-12-26 ENCOUNTER — Encounter: Payer: Self-pay | Admitting: Pain Medicine

## 2022-12-26 ENCOUNTER — Ambulatory Visit: Payer: Medicaid Other | Attending: Pain Medicine | Admitting: Pain Medicine

## 2022-12-26 VITALS — BP 100/55 | HR 79 | Temp 97.2°F | Resp 16 | Ht 61.0 in | Wt 230.0 lb

## 2022-12-26 DIAGNOSIS — M25561 Pain in right knee: Secondary | ICD-10-CM | POA: Diagnosis present

## 2022-12-26 DIAGNOSIS — G35 Multiple sclerosis: Secondary | ICD-10-CM

## 2022-12-26 DIAGNOSIS — M51379 Other intervertebral disc degeneration, lumbosacral region without mention of lumbar back pain or lower extremity pain: Secondary | ICD-10-CM

## 2022-12-26 DIAGNOSIS — M533 Sacrococcygeal disorders, not elsewhere classified: Secondary | ICD-10-CM | POA: Diagnosis present

## 2022-12-26 DIAGNOSIS — M25551 Pain in right hip: Secondary | ICD-10-CM | POA: Insufficient documentation

## 2022-12-26 DIAGNOSIS — M47816 Spondylosis without myelopathy or radiculopathy, lumbar region: Secondary | ICD-10-CM | POA: Diagnosis present

## 2022-12-26 DIAGNOSIS — M5441 Lumbago with sciatica, right side: Secondary | ICD-10-CM | POA: Diagnosis not present

## 2022-12-26 DIAGNOSIS — Z79891 Long term (current) use of opiate analgesic: Secondary | ICD-10-CM | POA: Diagnosis present

## 2022-12-26 DIAGNOSIS — Z79899 Other long term (current) drug therapy: Secondary | ICD-10-CM | POA: Diagnosis present

## 2022-12-26 DIAGNOSIS — G35D Multiple sclerosis, unspecified: Secondary | ICD-10-CM

## 2022-12-26 DIAGNOSIS — M25552 Pain in left hip: Secondary | ICD-10-CM | POA: Insufficient documentation

## 2022-12-26 DIAGNOSIS — M5137 Other intervertebral disc degeneration, lumbosacral region: Secondary | ICD-10-CM | POA: Diagnosis present

## 2022-12-26 DIAGNOSIS — G8929 Other chronic pain: Secondary | ICD-10-CM

## 2022-12-26 DIAGNOSIS — M79605 Pain in left leg: Secondary | ICD-10-CM | POA: Diagnosis present

## 2022-12-26 DIAGNOSIS — G894 Chronic pain syndrome: Secondary | ICD-10-CM | POA: Diagnosis not present

## 2022-12-26 DIAGNOSIS — M25562 Pain in left knee: Secondary | ICD-10-CM | POA: Diagnosis present

## 2022-12-26 DIAGNOSIS — M79604 Pain in right leg: Secondary | ICD-10-CM | POA: Diagnosis present

## 2022-12-26 DIAGNOSIS — M5442 Lumbago with sciatica, left side: Secondary | ICD-10-CM | POA: Insufficient documentation

## 2022-12-26 MED ORDER — OXYCODONE HCL 5 MG PO TABS: 5.0000 mg | ORAL_TABLET | Freq: Four times a day (QID) | ORAL | 0 refills | Status: DC | PRN

## 2022-12-26 NOTE — Progress Notes (Unsigned)
PROVIDER NOTE: Information contained herein reflects review and annotations entered in association with encounter. Interpretation of such information and data should be left to medically-trained personnel. Information provided to patient can be located elsewhere in the medical record under "Patient Instructions". Document created using STT-dictation technology, any transcriptional errors that may result from process are unintentional.    Patient: Shannon Benitez  Service Category: E/M  Provider: [name], MD  DOB: August 09, 1971  DOS: 12/26/2022  Referring Provider: Danae Orleans, MD  MRN: [id number]  Specialty: Interventional Pain Management  PCP: Danae Orleans, MD  Type: Established Patient  Setting: Ambulatory outpatient    Location: Office  Delivery: Face-to-face     HPI  Ms. Khylah Shimkus, a 52 y.o. year old female, is here today because of her Chronic pain syndrome [G89.4]. Ms. Risk primary complain today is Back Pain (lower) Last encounter: My last encounter with her was on 12/05/2022. Pertinent problems: Ms. Pheng has Multiple sclerosis ([place]); Neurogenic pain; Chronic sacroiliac joint pain (Bilateral) (L>R); Chronic pain syndrome; Chronic low back pain (1ry area of Pain) (Bilateral) (L>R) w/ sciatica (Bilateral); Chronic lower extremity pain (2ry area of Pain) (Bilateral) (R>L); Chronic low back pain (Bilateral) (L>R) w/o sciatica; Muscle spasticity; Spondylosis without myelopathy or radiculopathy, lumbosacral region; Lumbar facet syndrome (Bilateral) (R>L); Abnormal MRI, lumbar spine (06/16/2018); Lumbar facet hypertrophy (Multilevel) (Bilateral); Osteoarthritis of facet joint of lumbar spine; Osteoarthritis of lumbar spine; DDD (degenerative disc disease), lumbosacral; Lumbar spondylosis; Diabetic peripheral neuropathy ([place]); 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); Abnormal MRI, cervical spine (12/27/20); Scapulalgia (Left); Traumatic injury of  scapula (Left); Other spondylosis, sacral and sacrococcygeal region; Acute exacerbation of chronic low back pain; Chronic knee pain (Bilateral); Osteoarthritis of knee (Right); Osteochondroma of proximal fibula (Right); Osteochondroma of proximal tibia (Right); Osteoarthritis involving multiple joints; Abnormal MRI, thoracic spine (12/27/2020); Chronic hip pain (Bilateral); Osteoarthritis of hips (Bilateral); Fibromyalgia; Intractable low back pain; and Myofascial pain syndrome of lumbar spine on their pertinent problem list. Pain Assessment: Severity of Chronic pain is reported as a 10-Worst pain ever/10. Location: Back Lower/right leg to the knee. Onset: More than a month ago. Quality: Aching, Throbbing, Shooting. Timing: Constant. Modifying factor(s): nothing. Vitals:  height is 5' 1"$  (1.549 m) and weight is 230 lb (104.3 kg). Her temporal temperature is 97.2 F (36.2 C) (abnormal). Her blood pressure is 100/55 (abnormal) and her pulse is 79. Her respiration is 16 and oxygen saturation is 96%.  BMI: Estimated body mass index is 43.46 kg/m as calculated from the following:   Height as of this encounter: 5' 1"$  (1.549 m).   Weight as of this encounter: 230 lb (104.3 kg).  Reason for encounter: medication management.  The patient indicates doing well with the current medication regimen. No adverse reactions or side effects reported to the medications.   Routine UDS ordered today.   RTCB: 04/13/2023   Pharmacotherapy Assessment  Analgesic: Oxycodone IR 5 mg, 1 tab PO QID from an initial BID (10 mg/day of oxycodone) (now at 20 mg/day) MME/day: 30 mg/day.   Monitoring: [place] PMP: PDMP reviewed during this encounter.       Pharmacotherapy: No side-effects or adverse reactions reported. Compliance: No problems identified. Effectiveness: Clinically acceptable.  Landis Martins, RN  12/26/2022  2:34 PM  Sign when Signing Visit Nursing Pain Medication Assessment:  Safety precautions to be maintained  throughout the outpatient stay will include: orient to surroundings, keep bed in low position, maintain call bell within reach at all times, provide assistance with  transfer out of bed and ambulation.  Medication Inspection Compliance: Pill count conducted under aseptic conditions, in front of the patient. Neither the pills nor the bottle was removed from the patient's sight at any time. Once count was completed pills were immediately returned to the patient in their original bottle.  Medication: Oxycodone IR Pill/Patch Count: 82 out of 120 Pill/Patch Appearance: Markings consistent with prescribed medication Bottle Appearance: Standard pharmacy container. Clearly labeled. Filled Date: 02 / 12 / 2024 Last Medication intake:  Today    No results found for: "CBDTHCR" No results found for: "D8THCCBX" No results found for: "D9THCCBX"  UDS:  Summary  Date Value Ref Range Status  01/03/2022 Note  Final    Comment:    ==================================================================== Tox[employer] Select 13 (MW) ==================================================================== Test                             Result       Flag       Units  Drug Present and Declared for Prescription Verification   Oxycodone                      739          EXPECTED   ng/mg creat   Noroxycodone                   4595         EXPECTED   ng/mg creat    Sources of oxycodone include scheduled prescription medications.    Noroxycodone is an expected metabolite of oxycodone.    Phenobarbital                  PRESENT      EXPECTED    Phenobarbital is an expected metabolite of primidone; Phenobarbital    may also be administered as a prescription drug.  ==================================================================== Test                      Result    Flag   Units      Ref Range   Creatinine              144              mg/dL       >=20 ==================================================================== Declared Medications:  The flagging and interpretation on this report are based on the  following declared medications.  Unexpected results may arise from  inaccuracies in the declared medications.   **Note: The testing scope of this panel includes these medications:   Oxycodone  Primidone (Mysoline)   **Note: The testing scope of this panel does not include the  following reported medications:   Acetaminophen (Excedrin)  Aspirin (Excedrin)  Baclofen (Lioresal)  Caffeine (Excedrin)  Dimethyl Fumarate  Duloxetine (Cymbalta)  Lamotrigine (Lamictal)  Levetiracetam (Keppra)  Loperamide  Meloxicam (Mobic)  Metformin  Mirabegron  Naloxone (Narcan)  Omeprazole (Prilosec)  Pregabalin (Lyrica)  Propranolol (Inderal)  Sertraline (Zoloft)  Sitagliptin (Januvia) ==================================================================== For clinical consultation, please call [phone]. ====================================================================       ROS  Constitutional: Denies any fever or chills Gastrointestinal: No reported hemesis, hematochezia, vomiting, or acute GI distress Musculoskeletal: Denies any acute onset joint swelling, redness, loss of ROM, or weakness Neurological: No reported episodes of acute onset apraxia, aphasia, dysarthria, agnosia, amnesia, paralysis, loss of coordination, or loss of consciousness  Medication Review  Dimethyl Fumarate, aspirin-acetaminophen-caffeine, baclofen, lamoTRIgine, levETIRAcetam,  loperamide, meloxicam, metFORMIN, mirabegron ER, naloxone, omeprazole, oxyCODONE, pregabalin, primidone, propranolol, sertraline, and sitaGLIPtin  History Review  Allergy: Ms. Skender is allergic to 5-alpha reductase inhibitors, desvenlafaxine, glatiramer, rebif [interferon beta-1a], gramineae pollens, and latex. Drug: Ms. Gossman  has no history on file for drug  use. Alcohol:  reports no history of alcohol use. Tobacco:  reports that she quit smoking about 6 years ago. Her smoking use included cigarettes. She has a 9.00 pack-year smoking history. She has quit using smokeless tobacco. Social: Ms. Levene  reports that she quit smoking about 6 years ago. Her smoking use included cigarettes. She has a 9.00 pack-year smoking history. She has quit using smokeless tobacco. She reports that she does not drink alcohol. Medical:  has a past medical history of Acute postoperative pain (04/30/2019), Diabetes mellitus without complication ([place]), GERD (gastroesophageal reflux disease), Headache, MS (multiple sclerosis) (HCC), Numbness and tingling of both legs below knees, Osteoarthritis, Vertigo, and Wears dentures. Surgical: Ms. Vaynshteyn  has a past surgical history that includes Abdominal hysterectomy; Cesarean section; Tumor removal (Left); Cataract extraction w/PHACO (Right, 03/18/2019); and Cataract extraction w/PHACO (Left, 04/13/2019). Family: family history is not on file. She was adopted.  Laboratory Chemistry Profile   Renal Lab Results  Component Value Date   BUN 19 10/01/2022   CREATININE 0.59 10/01/2022   BCR 13 11/27/2018   GFRAA >60 09/25/2019   GFRNONAA >60 10/01/2022    Hepatic Lab Results  Component Value Date   AST 18 10/01/2022   ALT 14 10/01/2022   ALBUMIN 4.0 10/01/2022   ALKPHOS 91 10/01/2022    Electrolytes Lab Results  Component Value Date   NA 138 10/01/2022   K 3.4 (L) 10/01/2022   CL 103 10/01/2022   CALCIUM 9.7 10/01/2022   MG 2.2 11/27/2018    Bone Lab Results  Component Value Date   25OHVITD1 55 11/27/2018   25OHVITD2 <1.0 11/27/2018   25OHVITD3 55 11/27/2018    Inflammation (CRP: Acute Phase) (ESR: Chronic Phase) Lab Results  Component Value Date   CRP 10 11/27/2018   ESRSEDRATE 42 (H) 11/27/2018         Note: Above Lab results reviewed.  Recent Imaging Review  DG PAIN CLINIC C-ARM 1-60 MIN NO  REPORT Fluoro was used, but no Radiologist interpretation will be provided.  Please refer to "NOTES" tab for provider progress note. Note: Reviewed        Physical Exam  General appearance: Well nourished, well developed, and well hydrated. In no apparent acute distress Mental status: Alert, oriented x 3 (person, place, & time)       Respiratory: No evidence of acute respiratory distress Eyes: PERLA Vitals: BP (!) 100/55   Pulse 79   Temp (!) 97.2 F (36.2 C) (Temporal)   Resp 16   Ht 5' 1"$  (1.549 m)   Wt 230 lb (104.3 kg)   SpO2 96%   BMI 43.46 kg/m  BMI: Estimated body mass index is 43.46 kg/m as calculated from the following:   Height as of this encounter: 5' 1"$  (1.549 m).   Weight as of this encounter: 230 lb (104.3 kg). Ideal: Ideal body weight: 47.8 kg (105 lb 6.1 oz) Adjusted ideal body weight: 70.4 kg (155 lb 3.6 oz)  Assessment   Diagnosis Status  1. Chronic pain syndrome   2. Chronic low back pain (1ry area of Pain) (Bilateral) (L>R) w/ sciatica (Bilateral)   3. Chronic lower extremity pain (2ry area of Pain) (Bilateral) (R>L)   4.  Multiple sclerosis ([place])   5. DDD (degenerative disc disease), lumbosacral   6. Chronic sacroiliac joint pain (Bilateral) (L>R)   7. Lumbar facet syndrome (Bilateral) (R>L)   8. Chronic knee pain (Bilateral)   9. Chronic hip pain (Bilateral)   10. Pharmacologic therapy   11. Chronic use of opiate for therapeutic purpose   12. Encounter for medication management   13. Encounter for chronic pain management    Controlled Controlled Controlled   Updated Problems: No problems updated.  Plan of Care  Problem-specific:  No problem-specific Assessment & Plan notes found for this encounter.  Ms. Emera Spath has a current medication list which includes the following long-term medication(s): baclofen, lamotrigine, levetiracetam, meloxicam, naloxone, omeprazole, pregabalin, primidone, propranolol, propranolol, sitagliptin,  baclofen, levetiracetam, meloxicam, metformin, omeprazole, [START ON 01/13/2023] oxycodone, [START ON 02/12/2023] oxycodone, [START ON 03/14/2023] oxycodone, pregabalin, primidone, and sitagliptin.  Pharmacotherapy (Medications Ordered): Meds ordered this encounter  Medications   oxyCODONE (OXY IR/ROXICODONE) 5 MG immediate release tablet    Sig: Take 1 tablet (5 mg total) by mouth every 6 (six) hours as needed for severe pain. Must last 30 days.    Dispense:  120 tablet    Refill:  0    DO NOT: delete (not duplicate); no partial-fill (will deny script to complete), no refill request (F/U required). DISPENSE: 1 day early if closed on fill date. WARN: No CNS-depressants within 8 hrs of med.   oxyCODONE (OXY IR/ROXICODONE) 5 MG immediate release tablet    Sig: Take 1 tablet (5 mg total) by mouth every 6 (six) hours as needed for severe pain. Must last 30 days.    Dispense:  120 tablet    Refill:  0    DO NOT: delete (not duplicate); no partial-fill (will deny script to complete), no refill request (F/U required). DISPENSE: 1 day early if closed on fill date. WARN: No CNS-depressants within 8 hrs of med.   oxyCODONE (OXY IR/ROXICODONE) 5 MG immediate release tablet    Sig: Take 1 tablet (5 mg total) by mouth every 6 (six) hours as needed for severe pain. Must last 30 days.    Dispense:  120 tablet    Refill:  0    DO NOT: delete (not duplicate); no partial-fill (will deny script to complete), no refill request (F/U required). DISPENSE: 1 day early if closed on fill date. WARN: No CNS-depressants within 8 hrs of med.   Orders:  Orders Placed This Encounter  Procedures   ToxASSURE Select 13 (MW), Urine    Volume: 30 ml(s). Minimum 3 ml of urine is needed. Document temperature of fresh sample. Indications: Long term (current) use of opiate analgesic [id number])    Order Specific Question:   Release to patient    Answer:   Immediate   Follow-up plan:   Return in about 4 months (around 04/13/2023) for  Eval-day (M,W), (F2F), (MM).      Interventional Therapies  Risk Factors  Considerations:   Allergy: LATEX NOTE: NO RFA until BMI is <30  Continue encouraging the patient to bring her BMI below 35.   Planned  Pending:   Therapeutic bilateral lumbar facet MBB #7 (#3 of 2023) (10/04/2022) NO-SHOW to pain management appointment for procedure: Bilateral lumbar facet block.  Did not call to cancel.   Under consideration:   Diagnostic bilateral T11-12 lumbar facet block   Diagnostic right T7-8 TESI   Diagnostic right T7 TFESI   Diagnostic ML CESI   Possible candidate for intrathecal pump  trial and implant    Completed:   Palliative bilateral lumbar facet MBB x7 (10/25/2022) (100/95/95) Therapeutic left lumbar facet RFA x3 (05/24/2020)  Therapeutic right lumbar facet RFA x3 (04/07/2020)  (08/10/2020) referral to bariatric surgery and medical weight management    Completed by other providers:   Therapeutic/diagnostic bilateral SI Blk x1 (09/05/2021) by Reece Leader, MD (PMR)  Diagnostic/therapeutic bilateral L4/5 TFESI x1 (09/12/2017) by Barnet Glasgow, MD [facility])  Diagnostic/therapeutic bilateral SI Blk x1 (01/30/2017) by Barnet Glasgow, MD [facility])    Therapeutic  Palliative (PRN) options:   Palliative bilateral lumbar facet MBB   Palliative left lumbar facet RFA  Palliative right lumbar facet RFA     Pharmacotherapy  Nonopioids transfer 08/10/2020: Mobic, baclofen, and Lyrica.       Recent Visits Date Type Provider Dept  12/05/22 Office Visit Milinda Pointer, MD [facility]  10/25/22 Procedure visit Milinda Pointer, MD [facility]  Showing recent visits within past 90 days and meeting all other requirements Today's Visits Date Type Provider Dept  12/26/22 Office Visit Milinda Pointer, MD [facility]  Showing today's visits and meeting all other requirements Future Appointments No visits were found meeting these  conditions. Showing future appointments within next 90 days and meeting all other requirements  I discussed the assessment and treatment plan with the patient. The patient was provided an opportunity to ask questions and all were answered. The patient agreed with the plan and demonstrated an understanding of the instructions.  Patient advised to call back or seek an in-person evaluation if the symptoms or condition worsens.  Duration of encounter: 45 minutes.  Total time on encounter, as per AMA guidelines included both the face-to-face and non-face-to-face time personally spent by the physician and/or other qualified health care professional(s) on the day of the encounter (includes time in activities that require the physician or other qualified health care professional and does not include time in activities normally performed by clinical staff). Physician's time may include the following activities when performed: Preparing to see the patient (e.g., pre-charting review of records, searching for previously ordered imaging, lab work, and nerve conduction tests) Review of prior analgesic pharmacotherapies. Reviewing PMP Interpreting ordered tests (e.g., lab work, imaging, nerve conduction tests) Performing post-procedure evaluations, including interpretation of diagnostic procedures Obtaining and/or reviewing separately obtained history Performing a medically appropriate examination and/or evaluation Counseling and educating the patient/family/caregiver Ordering medications, tests, or procedures Referring and communicating with other health care professionals (when not separately reported) Documenting clinical information in the electronic or other health record Independently interpreting results (not separately reported) and communicating results to the patient/ family/caregiver Care coordination (not separately reported)  Note by: [name], MD Date: 12/26/2022; Time: 3:25 PM

## 2022-12-26 NOTE — Progress Notes (Unsigned)
Nursing Pain Medication Assessment:  Safety precautions to be maintained throughout the outpatient stay will include: orient to surroundings, keep bed in low position, maintain call bell within reach at all times, provide assistance with transfer out of bed and ambulation.  Medication Inspection Compliance: Pill count conducted under aseptic conditions, in front of the patient. Neither the pills nor the bottle was removed from the patient's sight at any time. Once count was completed pills were immediately returned to the patient in their original bottle.  Medication: Oxycodone IR Pill/Patch Count: 82 out of 120 Pill/Patch Appearance: Markings consistent with prescribed medication Bottle Appearance: Standard pharmacy container. Clearly labeled. Filled Date: 02 / 12 / 2024 Last Medication intake:  Today

## 2022-12-26 NOTE — Patient Instructions (Addendum)
____________________________________________________________________________________________  Pain Prevention Technique  Definition:   A technique used to minimize the effects of an activity known to cause inflammation or swelling, which in turn leads to an increase in pain.  Purpose: To prevent swelling from occurring. It is based on the fact that it is easier to prevent swelling from happening than it is to get rid of it, once it occurs.  Contraindications: Anyone with allergy or hypersensitivity to the recommended medications. Anyone taking anticoagulants (Blood Thinners) (e.g., Coumadin, Warfarin, Plavix, etc.). Patients in Renal Failure.  Technique: Before you undertake an activity known to cause pain, or a flare-up of your chronic pain, and before you experience any pain, do the following:  On a full stomach, take 4 (four) over the counter Ibuprofens 228m tablets (Motrin), for a total of 800 mg. In addition, take over the counter Magnesium 400 to 500 mg, before doing the activity.  Six (6) hours later, again on a full stomach, repeat the Ibuprofen. That night, take a warm shower and stretch under the running warm water.  This technique may be sufficient to abort the pain and discomfort before it happens. Keep in mind that it takes a lot less medication to prevent swelling than it takes to eliminate it once it occurs.  ____________________________________________________________________________________________    _________________________________________________________________________________________  Body mass index (BMI)  Body mass index (BMI) is a common tool for deciding whether a person has an appropriate body weight.  It measures a persons weight in relation to their height.   According to the [facility] ([facility]H): A BMI of less than 18.5 means that a person is underweight. A BMI of between 18.5 and 24.9 is ideal. A BMI of between 25 and 29.9 is  overweight. A BMI over 30 indicates obesity.  Weight Management Required  URGENT: Your weight has been found to be adversely affecting your health.  Dear [name]:  Your current Estimated body mass index is 44.02 kg/m as calculated from the following:   Height as of 12/05/22: 5' 1"$  (1.549 m).   Weight as of 12/05/22: 233 lb (105.7 kg).  Please use the table below to identify your weight category and associated incidence of chronic pain, secondary to your weight.  Body Mass Index (BMI) Classification BMI level (kg/m2) Category Associated incidence of chronic pain  <18  Underweight   18.5-24.9 Ideal body weight   25-29.9 Overweight  20%  30-34.9 Obese (Class I)  68%  35-39.9 Severe obesity (Class II)  136%  >40 Extreme obesity (Class III)  254%   In addition: You will be considered "Morbidly Obese", if your BMI is above 30 and you have one or more of the following conditions which are known to be caused and/or directly associated with obesity: 1.    Type 2 Diabetes (Which in turn can lead to cardiovascular diseases (CVD), stroke, peripheral vascular diseases (PVD), retinopathy, nephropathy, and neuropathy) 2.    Cardiovascular Disease (High Blood Pressure; Congestive Heart Failure; High Cholesterol; Coronary Artery Disease; Angina; or History of Heart Attacks) 3.    Breathing problems (Asthma; obesity-hypoventilation syndrome; obstructive sleep apnea; chronic inflammatory airway disease; reactive airway disease; or shortness of breath) 4.    Chronic kidney disease 5.    Liver disease (nonalcoholic fatty liver disease) 6.    High blood pressure 7.    Acid reflux (gastroesophageal reflux disease; heartburn) 8.    Osteoarthritis (OA) (with any of the following: hip pain; knee pain; and/or low back pain) 9.  Low back pain (Lumbar Facet Syndrome; and/or Degenerative Disc Disease) 10.  Hip pain (Osteoarthritis of hip) (For every 1 lbs of added body weight, there is a 2 lbs increase  in pressure inside of each hip articulation. 1:2 mechanical relationship) 11.  Knee pain (Osteoarthritis of knee) (For every 1 lbs of added body weight, there is a 4 lbs increase in pressure inside of each knee articulation. 1:4 mechanical relationship) (patients with a BMI>30 kg/m2 were 6.8 times more likely to develop knee OA than normal-weight individuals) 12.  Cancer: Epidemiological studies have shown that obesity is a risk factor for: post-menopausal breast cancer; cancers of the endometrium, colon and kidney cancer; malignant adenomas of the oesophagus. Obese subjects have an approximately 1.5-3.5-fold increased risk of developing these cancers compared with normal-weight subjects, and it has been estimated that between 15 and 45% of these cancers can be attributed to overweight. More recent studies suggest that obesity may also increase the risk of other types of cancer, including pancreatic, hepatic and gallbladder cancer. (Ref: Obesity and cancer. Pischon T, Nthlings U, [employer] H. Proc Nutr Soc. 2008 May;67(2):128-45. doi: [id number].) The International Agency for Research on Cancer (IARC) has identified 13 cancers associated with overweight and obesity: meningioma, multiple myeloma, adenocarcinoma of the esophagus, and cancers of the thyroid, postmenopausal breast cancer, gallbladder, stomach, liver, pancreas, kidney, ovaries, uterus, colon and rectal (colorectal) cancers. 13 percent of all cancers diagnosed in women and 24 percent of those diagnosed in men are associated with overweight and obesity.  Recommendation: At this point it is urgent that you take a step back and concentrate in loosing weight. Dedicate 100% of your efforts on this task. Nothing else will improve your health more than bringing your weight down and your BMI to less than 30. If you are here, you probably have chronic pain. We know that most chronic pain patients have difficulty exercising secondary to their pain.  For this reason, you must rely on proper nutrition and diet in order to lose the weight. If your BMI is above 40, you should seriously consider bariatric surgery. A realistic goal is to lose 10% of your body weight over a period of 12 months.  Be honest to yourself, if over time you have unsuccessfully tried to lose weight, then it is time for you to seek professional help and to enter a medically supervised weight management program, and/or undergo bariatric surgery. Stop procrastinating.   Pain management considerations:  1.    Pharmacological Problems: Be advised that the use of opioid analgesics (oxycodone; hydrocodone; morphine; methadone; codeine; and all of their derivatives) have been associated with decreased metabolism and weight gain.  For this reason, should we see that you are unable to lose weight while taking these medications, it may become necessary for [place] to taper down and indefinitely discontinue them.  2.    Technical Problems: The incidence of successful interventional therapies decreases as the patient's BMI increases. It is much more difficult to accomplish a safe and effective interventional therapy on a patient with a BMI above 35. 3.    Radiation Exposure Problems: The x-rays machine, used to accomplish injection therapies, will automatically increase their x-ray output in order to capture an appropriate bone image. This means that radiation exposure increases exponentially with the patient's BMI. (The higher the BMI, the higher the radiation exposure.) Although the level of radiation used at a given time is still safe to the patient, it is not for the physician and/or assisting staff. Unfortunately,  radiation exposure is accumulative. Because physicians and the staff have to do procedures and be exposed on a daily basis, this can result in health problems such as cancer and radiation burns. Radiation exposure to the staff is monitored by the radiation batches that they wear. The  exposure levels are reported back to the staff on a quarterly basis. Depending on levels of exposure, physicians and staff may be obligated by law to decrease this exposure. This means that they have the right and obligation to refuse providing therapies where they may be overexposed to radiation. For this reason, physicians may decline to offer therapies such as radiofrequency ablation or implants to patients with a BMI above 40. 4.    Current Trends: Be advised that the current trend is to no longer offer certain therapies to patients with a BMI equal to, or above 35, due to increase perioperative risks, increased technical procedural difficulties, and excessive radiation exposure to healthcare personnel.  _________________________________________________________________________________________   ____________________________________________________________________________________________  Patient Information update  To: All of our patients.  Re: Name change.  It has been made official that our current name, "[facility]"   will soon be changed to "[facility]".   The purpose of this change is to eliminate any confusion created by the concept of our practice being a "Medication Management Pain Clinic". In the past this has led to the misconception that we treat pain primarily by the use of prescription medications.  Nothing can be farther from the truth.   Understanding PAIN MANAGEMENT: To further understand what our practice does, you first have to understand that "Pain Management" is a subspecialty that requires additional training once a physician has completed their specialty training, which can be in either Anesthesia, Neurology, Psychiatry, or Physical Medicine and Rehabilitation (PMR). Each one of these contributes to the final approach taken by each physician to the management of  their patient's pain. To be a "Pain Management Specialist" you must have first completed one of the specialty trainings below.  Anesthesiologists - trained in clinical pharmacology and interventional techniques such as nerve blockade and regional as well as central neuroanatomy. They are trained to block pain before, during, and after surgical interventions.  Neurologists - trained in the diagnosis and pharmacological treatment of complex neurological conditions, such as Multiple Sclerosis, Parkinson's, spinal cord injuries, and other systemic conditions that may be associated with symptoms that may include but are not limited to pain. They tend to rely primarily on the treatment of chronic pain using prescription medications.  Psychiatrist - trained in conditions affecting the psychosocial wellbeing of patients including but not limited to depression, anxiety, schizophrenia, personality disorders, addiction, and other substance use disorders that may be associated with chronic pain. They tend to rely primarily on the treatment of chronic pain using prescription medications.   Physical Medicine and Rehabilitation (PMR) physicians, also known as physiatrists - trained to treat a wide variety of medical conditions affecting the brain, spinal cord, nerves, bones, joints, ligaments, muscles, and tendons. Their training is primarily aimed at treating patients that have suffered injuries that have caused severe physical impairment. Their training is primarily aimed at the physical therapy and rehabilitation of those patients. They may also work alongside orthopedic surgeons or neurosurgeons using their expertise in assisting surgical patients to recover after their surgeries.  INTERVENTIONAL PAIN MANAGEMENT is sub-subspecialty of Pain Management.  Our physicians are Board-certified in Anesthesia, Pain Management, and Interventional  Pain Management.  This meaning that not only have they been trained and  Board-certified in their specialty of Anesthesia, and subspecialty of Pain Management, but they have also received further training in the sub-subspecialty of Interventional Pain Management, in order to become Board-certified as INTERVENTIONAL PAIN MANAGEMENT SPECIALIST.    Mission: Our goal is to use our skills in  [place] as alternatives to the chronic use of prescription opioid medications for the treatment of pain. To make this more clear, we have changed our name to reflect what we do and offer. We will continue to offer medication management assessment and recommendations, but we will not be taking over any patient's medication management.  ____________________________________________________________________________________________     ____________________________________________________________________________________________  Opioid Pain Medication Update  To: All patients taking opioid pain medications. (I.e.: hydrocodone, hydromorphone, oxycodone, oxymorphone, morphine, codeine, methadone, tapentadol, tramadol, buprenorphine, fentanyl, etc.)  Re: Updated review of side effects and adverse reactions of opioid analgesics, as well as new information about long term effects of this class of medications.  Direct risks of long-term opioid therapy are not limited to opioid addiction and overdose. Potential medical risks include serious fractures, breathing problems during sleep, hyperalgesia, immunosuppression, chronic constipation, bowel obstruction, myocardial infarction, and tooth decay secondary to xerostomia.  Unpredictable adverse effects that can occur even if you take your medication correctly: Cognitive impairment, respiratory depression, and death. Most people think that if they take their medication "correctly", and "as instructed", that they will be safe. Nothing could be farther from the truth. In reality, a significant amount of recorded deaths associated  with the use of opioids has occurred in individuals that had taken the medication for a long time, and were taking their medication correctly. The following are examples of how this can happen: Patient taking his/her medication for a long time, as instructed, without any side effects, is given a certain antibiotic or another unrelated medication, which in turn triggers a "Drug-to-drug interaction" leading to disorientation, cognitive impairment, impaired reflexes, respiratory depression or an untoward event leading to serious bodily harm or injury, including death.  Patient taking his/her medication for a long time, as instructed, without any side effects, develops an acute impairment of liver and/or kidney function. This will lead to a rapid inability of the body to breakdown and eliminate their pain medication, which will result in effects similar to an "overdose", but with the same medicine and dose that they had always taken. This again may lead to disorientation, cognitive impairment, impaired reflexes, respiratory depression or an untoward event leading to serious bodily harm or injury, including death.  A similar problem will occur with patients as they grow older and their liver and kidney function begins to decrease as part of the aging process.  Background information: Historically, the original case for using long-term opioid therapy to treat chronic noncancer pain was based on safety assumptions that subsequent experience has called into question. In 1996, the American Pain Society and the [place] Academy of [employer] Medicine issued a consensus statement supporting long-term opioid therapy. This statement acknowledged the dangers of opioid prescribing but concluded that the risk for addiction was low; respiratory depression induced by opioids was short-lived, occurred mainly in opioid-naive patients, and was antagonized by pain; tolerance was not a common problem; and efforts to control diversion  should not constrain opioid prescribing. This has now proven to be wrong. Experience regarding the risks for opioid addiction, misuse, and overdose in community practice has failed to support these assumptions.  According  to the Centers for Disease Control and Prevention, fatal overdoses involving opioid analgesics have increased sharply over the past decade. Currently, more than 96,700 people die from drug overdoses every year. Opioids are a factor in 7 out of every 10 overdose deaths. Deaths from drug overdose have surpassed motor vehicle accidents as the leading cause of death for individuals between the ages of 34 and 39.  Clinical data suggest that neuroendocrine dysfunction may be very common in both men and women, potentially causing hypogonadism, erectile dysfunction, infertility, decreased libido, osteoporosis, and depression. Recent studies linked higher opioid dose to increased opioid-related mortality. Controlled observational studies reported that long-term opioid therapy may be associated with increased risk for cardiovascular events. Subsequent meta-analysis concluded that the safety of long-term opioid therapy in elderly patients has not been proven.   Side Effects and adverse reactions: Common side effects: Drowsiness (sedation). Dizziness. Nausea and vomiting. Constipation. Physical dependence -- Dependence often manifests with withdrawal symptoms when opioids are discontinued or decreased. Tolerance -- As you take repeated doses of opioids, you require increased medication to experience the same effect of pain relief. Respiratory depression -- This can occur in healthy people, especially with higher doses. However, people with COPD, asthma or other lung conditions may be even more susceptible to fatal respiratory impairment.  Uncommon side effects: An increased sensitivity to feeling pain and extreme response to pain (hyperalgesia). Chronic use of opioids can lead to  this. Delayed gastric emptying (the process by which the contents of your stomach are moved into your small intestine). Muscle rigidity. Immune system and hormonal dysfunction. Quick, involuntary muscle jerks (myoclonus). Arrhythmia. Itchy skin (pruritus). Dry mouth (xerostomia).  Long-term side effects: Chronic constipation. Sleep-disordered breathing (SDB). Increased risk of bone fractures. Hypothalamic-pituitary-adrenal dysregulation. Increased risk of overdose.  RISKS: Fractures and Falls:  Opioids increase the risk and incidence of falls. This is of particular importance in elderly patients.  Endocrine System:  Long-term administration is associated with endocrine abnormalities. Influences on both the hypothalamic-pituitary-adrenal axis?and the hypothalamic-pituitary-gonadal axis have been demonstrated with consequent hypogonadism and adrenal insufficiency in both sexes. Hypogonadism and decreased levels of dehydroepiandrosterone sulfate have been reported in men and women. Endocrine effects can lead to: Amenorrhoea in women Reduced libido in both sexes Erectile dysfunction in men Infertility Depression and fatigue Patients (particularly women of childbearing age) should avoid opioids. There is insufficient evidence to recommend routine monitoring of asymptomatic patients taking opioids in the long-term for hormonal deficiencies.  Immune System: Human studies have demonstrated that opioids have an immunomodulating effect. These effects are mediated via opioid receptors both on immune effector cells and in the central nervous system. Opioids have been demonstrated to have adverse effects on antimicrobial response and anti-tumour surveillance. Buprenorphine has been demonstrated to have no impact on immune function.  Opioid Induced Hyperalgesia: Human studies have demonstrated that prolonged use of opioids can lead to a state of abnormal pain sensitivity, sometimes called  opioid induced hyperalgesia (OIH). Opioid induced hyperalgesia is not usually seen in the absence of tolerance to opioid analgesia. Clinically, hyperalgesia may be diagnosed if the patient on long-term opioid therapy presents with increased pain. This might be qualitatively and anatomically distinct from pain related to disease progression or to breakthrough pain resulting from development of opioid tolerance. Pain associated with hyperalgesia tends to be more diffuse than the pre-existing pain and less defined in quality. Management of opioid induced hyperalgesia requires opioid dose reduction.  Cancer: Chronic opioid therapy has been associated with an increased risk of  cancer among noncancer patients with chronic pain. This association was more evident in chronic strong opioid users. Chronic opioid consumption causes significant pathological changes in the small intestine and colon. Epidemiological studies have found that there is a link between opium dependence and initiation of gastrointestinal cancers. Cancer is the second leading cause of death after cardiovascular disease. Chronic use of opioids can cause multiple conditions such as GERD, immunosuppression and renal damage as well as carcinogenic effects, which are associated with the incidence of cancers.   Mortality: Long-term opioid use has been associated with increased mortality among patients with chronic non-cancer pain (CNCP).  Prescription of long-acting opioids for chronic noncancer pain was associated with a significantly increased risk of all-cause mortality, including deaths from causes other than overdose.  Reference: Von Korff M, Kolodny A, Deyo RA, Chou R. Long-term opioid therapy reconsidered. Ann Intern Med. 2011 Sep 6;155(5):325-8. doi: 10.7326/[id number]. PMID: [id number]; PMCIDXX:[id number]. Morley Kos, Hayward RA, Dunn KM, [place] KP. Risk of adverse events in patients prescribed  long-term opioids: A cohort study in the [place] Clinical Practice Research [employer]. Eur J Pain. 2019 May;23(5):908-922. doi: 10.1002/ejp.1357. Epub 2019 Jan 31. PMID: [id number]. Colameco S, Coren JS, Ciervo CA. Continuous opioid treatment for chronic noncancer pain: a time for moderation in prescribing. Postgrad Med. 2009 Jul;121(4):61-6. doi: 10.3810/pgm.2009.07.2032. PMID: [id number]. Heywood Bene RN, Damon SD, Blazina I, Rosalio Loud, Bougatsos C, Deyo RA. The effectiveness and risks of long-term opioid therapy for chronic pain: a systematic review for a [employer] of Health Pathways to [employer]. Ann Intern Med. 2015 Feb 17;162(4):276-86. doi: [id number]. PMID: [id number]. Marjory Sneddon [facility], Makuc DM. NCHS Data Brief No. 22. [place]: Centers for Disease Control and Prevention; 2009. Sep, Increase in Fatal Poisonings Involving Opioid Analgesics in the [place], 1999-2006. Song IA, Choi HR, Oh TK. Long-term opioid use and mortality in patients with chronic non-cancer pain: Ten-year follow-up study in [place] from 2010 through 2019. EClinicalMedicine. 2022 Jul 18;51:101558. doi: [id number]/[id number]. PMID: [id number]; PMCIDOX:8550940. Huser, W., Schubert, T., Vogelmann, T. et al. All-cause mortality in patients with long-term opioid therapy compared with non-opioid analgesics for chronic non-cancer pain: a database study. [place] Med 18, 162 (2020). [email or web address] Rashidian H, Roxy Cedar, Malekzadeh R, Haghdoost AA. An Ecological Study of the Association between Opiate Use and Incidence of Cancers. Addict Health. 2016 Fall;8(4):252-260. PMID: [id number]; PMCIDQI:9185013.  Our Goal: Our goal is to control your pain with means other than the use of opioid pain medications.  Our Recommendation: Talk to your physician about coming off of these medications. We can assist you with the tapering down and stopping these  medicines. Based on the new information, even if you cannot completely stop the medication, a decrease in the dose may be associated with a lesser risk. Ask for other means of controlling the pain. Decrease or eliminate those factors that significantly contribute to your pain such as smoking, obesity, and a diet heavily tilted towards "inflammatory" nutrients.  ____________________________________________________________________________________________     ____________________________________________________________________________________________  [facility]ron Brazen Pain Medication Shortage  The [place] is experiencing worsening drug shortages. These have had a negative widespread effect on patient care and treatment. Not expected to improve any time soon. Predicted to last past 2029.   Drug shortage list (generic names) Oxycodone IR Oxycodone/APAP Oxymorphone IR Hydromorphone Hydrocodone/APAP Morphine  Where is the problem?  Manufacturing and supply level.  Will this shortage affect you?  Only if you take any of the  above pain medications.  How? You may be unable to fill your prescription.  Your pharmacist may offer a "partial fill" of your prescription. (Warning: Do not accept partial fills.) Prescriptions partially filled cannot be transferred to another pharmacy. Read our Medication Rules and Regulation. Depending on how much medicine you are dependent on, you may experience withdrawals when unable to get the medication.  Recommendations: Consider ending your dependence on opioid pain medications. Ask your pain specialist to assist you with the process. Consider switching to a medication currently not in shortage, such as Buprenorphine. Talk to your pain specialist about this option. Consider decreasing your pain medication requirements by managing tolerance thru "Drug Holidays". This may help minimize withdrawals, should you run out of medicine. Control your pain thru the use of  non-pharmacological interventional therapies.   Your prescriber: Prescribers cannot be blamed for shortages. Medication manufacturing and supply issues cannot be fixed by the prescriber.   NOTE: The prescriber is not responsible for supplying the medication, or solving supply issues. Work with your pharmacist to solve it. The patient is responsible for the decision to take or continue taking the medication and for identifying and securing a legal supply source. By law, supplying the medication is the job and responsibility of the pharmacy. The prescriber is responsible for the evaluation, monitoring, and prescribing of these medications.   Prescribers will NOT: Re-issue prescriptions that have been partially filled. Re-issue prescriptions already sent to a pharmacy.  Re-send prescriptions to a different pharmacy because yours did not have your medication. Ask pharmacist to order more medicine or transfer the prescription to another pharmacy. (Read below.)  New 2023 regulation: "July 06, 2022 Revised Regulation Allows DEA-Registered Pharmacies to Transfer Electronic Prescriptions at a Patient's Request [place] Patients now have the ability to request their electronic prescription be transferred to another pharmacy without having to go back to their practitioner to initiate the request. This revised regulation went into effect on Monday, July 02, 2022.     At a patient's request, a DEA-registered retail pharmacy can now transfer an electronic prescription for a controlled substance (schedules II-V) to another DEA-registered retail pharmacy. Prior to this change, patients would have to go through their practitioner to cancel their prescription and have it re-issued to a different pharmacy. The process was taxing and time consuming for both patients and practitioners.    The Drug Enforcement Administration [facility]) published its intent to revise the  process for transferring electronic prescriptions on September 23, 2020.  The final rule was published in the federal register on May 31, 2022 and went into effect 30 days later.  Under the final rule, a prescription can only be transferred once between pharmacies, and only if allowed under existing state or other applicable law. The prescription must remain in its electronic form; may not be altered in any way; and the transfer must be communicated directly between two licensed pharmacists. It's important to note, any authorized refills transfer with the original prescription, which means the entire prescription will be filled at the same pharmacy".  Reference: [employer] [facility] website announcement)  [email or web address] ([place])   [employer] / Vol. 88, No. 143 / Thursday, May 31, 2022 / Rules and Regulations DEPARTMENT OF JUSTICE  Drug Enforcement Administration  21 CFR Part 1306  [Docket No. [id number]]  RIN [id number] Transfer of Electronic Prescriptions for Schedules II-V Controlled Substances Between Pharmacies for Initial Filling  ____________________________________________________________________________________________  _______________________________________________________________________  Medication Rules  Purpose: To inform patients, and their family members, of our medication rules and regulations.  Applies to: All patients receiving prescriptions from our practice (written or electronic).  Pharmacy of record: This is the pharmacy where your electronic prescriptions will be sent. Make sure we have the correct one.  Electronic prescriptions: In compliance with the [place] (STOP) Act of 2017 (Session Lanny Cramp [phone]), effective November 05, 2018,  all controlled substances must be electronically prescribed. Written prescriptions, faxing, or calling prescriptions to a pharmacy will no longer be done.  Prescription refills: These will be provided only during in-person appointments. No medications will be renewed without a "face-to-face" evaluation with your provider. Applies to all prescriptions.  NOTE: The following applies primarily to controlled substances (Opioid* Pain Medications).   Type of encounter (visit): For patients receiving controlled substances, face-to-face visits are required. (Not an option and not up to the patient.)  Patient's responsibilities: Pain Pills: Bring all pain pills to every appointment (except for procedure appointments). Pill Bottles: Bring pills in original pharmacy bottle. Bring bottle, even if empty. Always bring the bottle of the most recent fill.  Medication refills: You are responsible for knowing and keeping track of what medications you are taking and when is it that you will need a refill. The day before your appointment: write a list of all prescriptions that need to be refilled. The day of the appointment: give the list to the admitting nurse. Prescriptions will be written only during appointments. No prescriptions will be written on procedure days. If you forget a medication: it will not be "Called in", "Faxed", or "electronically sent". You will need to get another appointment to get these prescribed. No early refills. Do not call asking to have your prescription filled early. Partial  or short prescriptions: Occasionally your pharmacy may not have enough pills to fill your prescription.  NEVER ACCEPT a partial fill or a prescription that is short of the total amount of pills that you were prescribed.  With controlled substances the law allows 72 hours for the pharmacy to complete the prescription.  If the prescription is not completed within 72 hours, the pharmacist will require a new prescription  to be written. This means that you will be short on your medicine and we WILL NOT send another prescription to complete your original prescription.  Instead, request the pharmacy to send a carrier to a nearby branch to get enough medication to provide you with your full prescription. Prescription Accuracy: You are responsible for carefully inspecting your prescriptions before leaving our office. Have the discharge nurse carefully go over each prescription with you, before taking them home. Make sure that your name is accurately spelled, that your address is correct. Check the name and dose of your medication to make sure it is accurate. Check the number of pills, and the written instructions to make sure they are clear and accurate. Make sure that you are given enough medication to last until your next medication refill appointment. Taking Medication: Take medication as prescribed. When it comes to controlled substances, taking less pills or less frequently than prescribed is permitted and encouraged. Never take more pills than instructed. Never take the medication more frequently than prescribed.  Inform other Doctors: Always inform, all of your healthcare providers, of all the medications you take. Pain Medication from other Providers: You are not allowed to accept any additional pain medication from any other Doctor or Healthcare provider. There are  two exceptions to this rule. (see below) In the event that you require additional pain medication, you are responsible for notifying us, as stated below. Cough Medicine: Often these contain an opioid, such as codeine or hydrocodone. Never accept or take cough medicine containing these opioids if you are already taking an opioid* medication. The combination may cause respiratory failure and death. Medication Agreement: You are responsible for carefully reading and following our Medication Agreement. This must be signed before receiving any prescriptions from  our practice. Safely store a copy of your signed Agreement. Violations to the Agreement will result in no further prescriptions. (Additional copies of our Medication Agreement are available upon request.) Laws, Rules, & Regulations: All patients are expected to follow all Federal and [employer], [employer], Rules, [employer] [facility]. Ignorance of the Laws does not constitute a valid excuse.  Illegal drugs and Controlled Substances: The use of illegal substances (including, but not limited to marijuana and its derivatives) and/or the illegal use of any controlled substances is strictly prohibited. Violation of this rule may result in the immediate and permanent discontinuation of any and all prescriptions being written by our practice. The use of any illegal substances is prohibited. Adopted CDC guidelines & recommendations: Target dosing levels will be at or below 60 MME/day. Use of benzodiazepines** is not recommended.  Exceptions: There are only two exceptions to the rule of not receiving pain medications from other Healthcare Providers. Exception #1 (Emergencies): In the event of an emergency (i.e.: accident requiring emergency care), you are allowed to receive additional pain medication. However, you are responsible for: As soon as you are able, call our office [phone], at any time of the day or night, and leave a message stating your name, the date and nature of the emergency, and the name and dose of the medication prescribed. In the event that your call is answered by a member of our staff, make sure to document and save the date, time, and the name of the person that took your information.  Exception #2 (Planned Surgery): In the event that you are scheduled by another doctor or dentist to have any type of surgery or procedure, you are allowed (for a period no longer than 30 days), to receive additional pain medication, for the acute post-op pain. However, in this case, you are responsible for picking  up a copy of our "Post-op Pain Management for Surgeons" handout, and giving it to your surgeon or dentist. This document is available at our office, and does not require an appointment to obtain it. Simply go to our office during business hours (Monday-Thursday from 8:00 AM to 4:00 PM) (Friday 8:00 AM to 12:00 Noon) or if you have a scheduled appointment with [place], prior to your surgery, and ask for it by name. In addition, you are responsible for: calling our office [phone], at any time of the day or night, and leaving a message stating your name, name of your surgeon, type of surgery, and date of procedure or surgery. Failure to comply with your responsibilities may result in termination of therapy involving the controlled substances. Medication Agreement Violation. Following the above rules, including your responsibilities will help you in avoiding a Medication Agreement Violation ("Breaking your Pain Medication Contract").  Consequences:  Not following the above rules may result in permanent discontinuation of medication prescription therapy.  *Opioid medications include: morphine, codeine, oxycodone, oxymorphone, hydrocodone, hydromorphone, meperidine, tramadol, tapentadol, buprenorphine, fentanyl, methadone. **Benzodiazepine medications include: diazepam (Valium), alprazolam (Xanax), clonazepam (Klonopine), lorazepam (  Ativan), clorazepate (Tranxene), chlordiazepoxide (Librium), estazolam (Prosom), oxazepam (Serax), temazepam (Restoril), triazolam (Halcion) (Last updated: 08/28/2022) ______________________________________________________________________    ______________________________________________________________________  Medication Recommendations and Reminders  Applies to: All patients receiving prescriptions (written and/or electronic).  Medication Rules & Regulations: You are responsible for reading, knowing, and following our "Medication Rules" document. These exist for your  safety and that of others. They are not flexible and neither are we. Dismissing or ignoring them is an act of "non-compliance" that may result in complete and irreversible termination of such medication therapy. For safety reasons, "non-compliance" will not be tolerated. As with the [place] fundamental legal principle of "ignorance of the law is no defense", we will accept no excuses for not having read and knowing the content of documents provided to you by our practice.  Pharmacy of record:  Definition: This is the pharmacy where your electronic prescriptions will be sent.  We do not endorse any particular pharmacy. It is up to you and your insurance to decide what pharmacy to use.  We do not restrict you in your choice of pharmacy. However, once we write for your prescriptions, we will NOT be re-sending more prescriptions to fix restricted supply problems created by your pharmacy, or your insurance.  The pharmacy listed in the electronic medical record should be the one where you want electronic prescriptions to be sent. If you choose to change pharmacy, simply notify our nursing staff. Changes will be made only during your regular appointments and not over the phone.  Recommendations: Keep all of your pain medications in a safe place, under lock and key, even if you live alone. We will NOT replace lost, stolen, or damaged medication. We do not accept "Police Reports" as proof of medications having been stolen. After you fill your prescription, take 1 week's worth of pills and put them away in a safe place. You should keep a separate, properly labeled bottle for this purpose. The remainder should be kept in the original bottle. Use this as your primary supply, until it runs out. Once it's gone, then you know that you have 1 week's worth of medicine, and it is time to come in for a prescription refill. If you do this correctly, it is unlikely that you will ever run out of medicine. To make sure that the  above recommendation works, it is very important that you make sure your medication refill appointments are scheduled at least 1 week before you run out of medicine. To do this in an effective manner, make sure that you do not leave the office without scheduling your next medication management appointment. Always ask the nursing staff to show you in your prescription , when your medication will be running out. Then arrange for the receptionist to get you a return appointment, at least 7 days before you run out of medicine. Do not wait until you have 1 or 2 pills left, to come in. This is very poor planning and does not take into consideration that we may need to cancel appointments due to bad weather, sickness, or emergencies affecting our staff. DO NOT ACCEPT A "Partial Fill": If for any reason your pharmacy does not have enough pills/tablets to completely fill or refill your prescription, do not allow for a "partial fill". The law allows the pharmacy to complete that prescription within 72 hours, without requiring a new prescription. If they do not fill the rest of your prescription within those 72 hours, you will need a separate prescription to fill the  remaining amount, which we will NOT provide. If the reason for the partial fill is your insurance, you will need to talk to the pharmacist about payment alternatives for the remaining tablets, but again, DO NOT ACCEPT A PARTIAL FILL, unless you can trust your pharmacist to obtain the remainder of the pills within 72 hours.  Prescription refills and/or changes in medication(s):  Prescription refills, and/or changes in dose or medication, will be conducted only during scheduled medication management appointments. (Applies to both, written and electronic prescriptions.) No refills on procedure days. No medication will be changed or started on procedure days. No changes, adjustments, and/or refills will be conducted on a procedure day. Doing so will interfere  with the diagnostic portion of the procedure. No phone refills. No medications will be "called into the pharmacy". No Fax refills. No weekend refills. No Holliday refills. No after hours refills.  Remember:  Business hours are:  Monday to Thursday 8:00 AM to 4:00 PM Provider's Schedule: Milinda Pointer, MD - Appointments are:  Medication management: Monday and Wednesday 8:00 AM to 4:00 PM Procedure day: Tuesday and Thursday 7:30 AM to 4:00 PM Gillis Santa, MD - Appointments are:  Medication management: Tuesday and Thursday 8:00 AM to 4:00 PM Procedure day: Monday and Wednesday 7:30 AM to 4:00 PM (Last update: 08/28/2022) ______________________________________________________________________    ____________________________________________________________________________________________  Drug Holidays  What is a "Drug Holiday"? Drug Holiday: is the name given to the process of slowly tapering down and temporarily stopping the pain medication for the purpose of decreasing or eliminating tolerance to the drug.  Benefits Improved effectiveness Decreased required effective dose Improved pain control End dependence on high dose therapy Decrease cost of therapy Uncovering "opioid-induced hyperalgesia". (OIH)  What is "opioid hyperalgesia"? It is a paradoxical increase in pain caused by exposure to opioids. Stopping the opioid pain medication, contrary to the expected, it actually decreases or completely eliminates the pain. Ref.: "A comprehensive review of opioid-induced hyperalgesia". Brion Aliment, et.al. Pain Physician. 2011 Mar-Apr;14(2):145-61.  What is tolerance? Tolerance: the progressive loss of effectiveness of a pain medicine due to repetitive use. A common problem of opioid pain medications.  How long should a "Drug Holiday" last? Effectiveness depends on the patient staying off all opioid pain medicines for a minimum of 14 consecutive days. (2 weeks)  How about just  taking less of the medicine? Does not work. Will not accomplish goal of eliminating the excess receptors.  How about switching to a different pain medicine? (AKA. "Opioid rotation") Does not work. Creates the illusion of effectiveness by taking advantage of inaccurate equivalent dose calculations between different opioids. -This "technique" was promoted by studies funded by [employer], such as [employer], creators of "OxyContin".  Can I stop the medicine "cold [place]"? Depends. You should always coordinate with your Pain Specialist to make the transition as smoothly as possible. Avoid stopping the medicine abruptly without consulting. We recommend a "slow taper".  What is a slow taper? Taper: refers to the gradual decrease in dose.   How do I stop/taper the dose? Slowly. Decrease the daily amount of pills that you take by one (1) pill every seven (7) days. This is called a "slow downward taper". Example: if you normally take four (4) pills per day, drop it to three (3) pills per day for seven (7) days, then to two (2) pills per day for seven (7) days, then to one (1) per day for seven (7) days, and then stop the medicine. The 14 day "  Drug Holiday" starts on the first day without medicine.   Will I experience withdrawals? Unlikely with a slow taper.  What triggers withdrawals? Withdrawals are triggered by the sudden/abrupt stop of high dose opioids. Withdrawals can be avoided by slowly decreasing the dose over a prolonged period of time.  What are withdrawals? Symptoms associated with sudden/abrupt reduction/stopping of high-dose, long-term use of pain medication. Withdrawal are seldom seen on low dose therapy, or patients rarely taking opioid medication.  Early Withdrawal Symptoms may include: Agitation Anxiety Muscle aches Increased tearing Insomnia Runny nose Sweating Yawning  Late symptoms may include: Abdominal cramping Diarrhea Dilated pupils Goose  bumps Nausea Vomiting  (Last update: 10/14/2022) ____________________________________________________________________________________________    ____________________________________________________________________________________________  WARNING: CBD (cannabidiol) & Delta (Delta-8 tetrahydrocannabinol) products.   Applicable to:  All individuals currently taking or considering taking CBD (cannabidiol) and, more important, all patients taking opioid analgesic controlled substances (pain medication). (Example: oxycodone; oxymorphone; hydrocodone; hydromorphone; morphine; methadone; tramadol; tapentadol; fentanyl; buprenorphine; butorphanol; dextromethorphan; meperidine; codeine; etc.)  Introduction:  Recently there has been a drive towards the use of "natural" products for the treatment of different conditions, including pain anxiety and sleep disorders. Marijuana and hemp are two varieties of the cannabis genus plants. Marijuana and its derivatives are illegal, while hemp and its derivatives are not. Cannabidiol (CBD) and tetrahydrocannabinol (THC), are two natural compounds found in plants of the Cannabis genus. They can both be extracted from hemp or marijuana. Both compounds interact with your body's endocannabinoid system in very different ways. CBD is associated with pain relief (analgesia) while THC is associated with the psychoactive effects ("the high") obtained from the use of marijuana products. There are two main types of THC: Delta-9, which comes from the marijuana plant and it is illegal, and Delta-8, which comes from the hemp plant, and it is legal. (Both, Delta-9-THC and Delta-8-THC are psychoactive and give you "the high".)   Legality:  Marijuana and its derivatives: illegal Hemp and its derivatives: Legal (State dependent) UPDATE: (12/22/2021) The Drug Enforcement Agency ([place]) issued a letter stating that "delta" cannabinoids, including Delta-8-THCO and Delta-9-THCO,  synthetically derived from hemp do not qualify as hemp and will be viewed as Schedule I drugs. (Schedule I drugs, substances, or chemicals are defined as drugs with no currently accepted medical use and a high potential for abuse. Some examples of Schedule I drugs are: heroin, lysergic acid diethylamide (LSD), marijuana (cannabis), 3,4-methylenedioxymethamphetamine (ecstasy), methaqualone, and peyote.) (https://jennings.com/)  Legal status of CBD in [place]:  "Conditionally Legal"  Reference: "FDA Regulation of Cannabis and Cannabis-Derived Products, Including Cannabidiol (CBD)" - [employer].com.pt  Warning:  CBD is not FDA approved and has not undergo the same manufacturing controls as prescription drugs.  This means that the purity and safety of available CBD may be questionable. Most of the time, despite manufacturer's claims, it is contaminated with THC (delta-9-tetrahydrocannabinol - the chemical in marijuana responsible for the "HIGH").  When this is the case, the [facility] contaminant will trigger a positive urine drug screen (UDS) test for Marijuana (carboxy-THC).   The FDA recently put out a warning about 5 things that everyone should be aware of regarding Delta-8 THC: Delta-8 THC products have not been evaluated or approved by the FDA for safe use and may be marketed in ways that put the public health at risk. The FDA has received adverse event reports involving delta-8 THC-containing products. Delta-8 THC has psychoactive and intoxicating effects. Delta-8 THC manufacturing often involve use of potentially harmful chemicals to create the concentrations  of delta-8 THC claimed in the marketplace. The final delta-8 THC product may have potentially harmful by-products (contaminants) due to the chemicals used in the process. Manufacturing of delta-8 THC products may occur in uncontrolled or  unsanitary settings, which may lead to the presence of unsafe contaminants or other potentially harmful substances. Delta-8 THC products should be kept out of the reach of children and pets.  NOTE: Because a positive UDS for any illicit substance is a violation of our medication agreement, your opioid analgesics (pain medicine) may be permanently discontinued.  MORE ABOUT CBD  General Information: CBD was discovered in 1 and it is a derivative of the cannabis sativa genus plants (Marijuana and Hemp). It is one of the 113 identified substances found in Marijuana. It accounts for up to 40% of the plant's extract. As of 2018, preliminary clinical studies on CBD included research for the treatment of anxiety, movement disorders, and pain. CBD is available and consumed in multiple forms, including inhalation of smoke or vapor, as an aerosol spray, and by mouth. It may be supplied as an oil containing CBD, capsules, dried cannabis, or as a liquid solution. CBD is thought not to be as psychoactive as THC (delta-9-tetrahydrocannabinol - the chemical in marijuana responsible for the "HIGH"). Studies suggest that CBD may interact with different biological target receptors in the body, including cannabinoid and other neurotransmitter receptors. As of 2018 the mechanism of action for its biological effects has not been determined.  Side-effects  Adverse reactions: Dry mouth, diarrhea, decreased appetite, fatigue, drowsiness, malaise, weakness, sleep disturbances, and others.  Drug interactions:  CBD may interact with medications such as blood-thinners. CBD causes drowsiness on its own and it will increase drowsiness caused by other medications, including antihistamines (such as Benadryl), benzodiazepines (Xanax, Ativan, Valium), antipsychotics, antidepressants, opioids, alcohol and supplements such as kava, melatonin and St. John's Wort.  Other drug interactions: Brivaracetam (Briviact); Caffeine; Carbamazepine  (Tegretol); Citalopram (Celexa); Clobazam (Onfi); Eslicarbazepine (Aptiom); Everolimus (Zostress); Lithium; Methadone (Dolophine); Rufinamide (Banzel); Sedative medications (CNS depressants); Sirolimus (Rapamune); Stiripentol (Diacomit); Tacrolimus (Prograf); Tamoxifen ; Soltamox); Topiramate (Topamax); Valproate; Warfarin (Coumadin); Zonisamide. (Last update: 10/15/2022) ____________________________________________________________________________________________   ____________________________________________________________________________________________  Naloxone Nasal Spray  Why am I receiving this medication? [place] STOP ACT requires that all patients taking high dose opioids or at risk of opioids respiratory depression, be prescribed an opioid reversal agent, such as Naloxone (AKA: Narcan).  What is this medication? NALOXONE (nal OX one) treats opioid overdose, which causes slow or shallow breathing, severe drowsiness, or trouble staying awake. Call emergency services after using this medication. You may need additional treatment. Naloxone works by reversing the effects of opioids. It belongs to a group of medications called opioid blockers.  COMMON BRAND NAME(S): Kloxxado, Narcan  What should I tell my care team before I take this medication? They need to know if you have any of these conditions: Heart disease Substance use disorder An unusual or allergic reaction to naloxone, other medications, foods, dyes, or preservatives Pregnant or trying to get pregnant Breast-feeding  When to use this medication? This medication is to be used for the treatment of respiratory depression (less than 8 breaths per minute) secondary to opioid overdose.   How to use this medication? This medication is for use in the nose. Lay the person on their back. Support their neck with your hand and allow the head to tilt back before giving the medication. The nasal spray should be given into 1  nostril. After giving the medication, move  the person onto their side. Do not remove or test the nasal spray until ready to use. Get emergency medical help right away after giving the first dose of this medication, even if the person wakes up. You should be familiar with how to recognize the signs and symptoms of a narcotic overdose. If more doses are needed, give the additional dose in the other nostril. Talk to your care team about the use of this medication in children. While this medication may be prescribed for children as young as newborns for selected conditions, precautions do apply.  Naloxone Overdosage: If you think you have taken too much of this medicine contact a poison control center or emergency room at once.  NOTE: This medicine is only for you. Do not share this medicine with others.  What if I miss a dose? This does not apply.  What may interact with this medication? This is only used during an emergency. No interactions are expected during emergency use. This list may not describe all possible interactions. Give your health care provider a list of all the medicines, herbs, non-prescription drugs, or dietary supplements you use. Also tell them if you smoke, drink alcohol, or use illegal drugs. Some items may interact with your medicine.  What should I watch for while using this medication? Keep this medication ready for use in the case of an opioid overdose. Make sure that you have the phone number of your care team and local hospital ready. You may need to have additional doses of this medication. Each nasal spray contains a single dose. Some emergencies may require additional doses. After use, bring the treated person to the nearest hospital or call 911. Make sure the treating care team knows that the person has received a dose of this medication. You will receive additional instructions on what to do during and after use of this medication before an emergency occurs.  What  side effects may I notice from receiving this medication? Side effects that you should report to your care team as soon as possible: Allergic reactions--skin rash, itching, hives, swelling of the face, lips, tongue, or throat Side effects that usually do not require medical attention (report these to your care team if they continue or are bothersome): Constipation Dryness or irritation inside the nose Headache Increase in blood pressure Muscle spasms Stuffy nose Toothache This list may not describe all possible side effects. Call your doctor for medical advice about side effects. You may report side effects to FDA at 1-800-FDA-1088.  Where should I keep my medication? Because this is an emergency medication, you should keep it with you at all times.  Keep out of the reach of children and pets. Store between 20 and 25 degrees C (68 and 77 degrees F). Do not freeze. Throw away any unused medication after the expiration date. Keep in original box until ready to use.  NOTE: This sheet is a summary. It may not cover all possible information. If you have questions about this medicine, talk to your doctor, pharmacist, or health care provider.   2023 [employer]/Gold Standard (2021-06-30 00:00:00)  ____________________________________________________________________________________________

## 2022-12-28 LAB — MED LIST ATTACHED SEPARATELY

## 2022-12-29 LAB — TOXASSURE SELECT 13 (MW), URINE

## 2023-04-10 ENCOUNTER — Encounter: Payer: Self-pay | Admitting: Pain Medicine

## 2023-04-10 ENCOUNTER — Ambulatory Visit: Payer: Medicaid Other | Attending: Pain Medicine | Admitting: Pain Medicine

## 2023-04-10 VITALS — BP 131/78 | HR 67 | Temp 97.4°F | Ht 61.0 in | Wt 230.0 lb

## 2023-04-10 DIAGNOSIS — M25551 Pain in right hip: Secondary | ICD-10-CM | POA: Diagnosis present

## 2023-04-10 DIAGNOSIS — R937 Abnormal findings on diagnostic imaging of other parts of musculoskeletal system: Secondary | ICD-10-CM

## 2023-04-10 DIAGNOSIS — M79605 Pain in left leg: Secondary | ICD-10-CM | POA: Diagnosis present

## 2023-04-10 DIAGNOSIS — Z79891 Long term (current) use of opiate analgesic: Secondary | ICD-10-CM | POA: Diagnosis present

## 2023-04-10 DIAGNOSIS — M25561 Pain in right knee: Secondary | ICD-10-CM | POA: Insufficient documentation

## 2023-04-10 DIAGNOSIS — M25562 Pain in left knee: Secondary | ICD-10-CM | POA: Diagnosis present

## 2023-04-10 DIAGNOSIS — M5442 Lumbago with sciatica, left side: Secondary | ICD-10-CM | POA: Diagnosis present

## 2023-04-10 DIAGNOSIS — M25552 Pain in left hip: Secondary | ICD-10-CM | POA: Diagnosis present

## 2023-04-10 DIAGNOSIS — G8929 Other chronic pain: Secondary | ICD-10-CM | POA: Diagnosis present

## 2023-04-10 DIAGNOSIS — G379 Demyelinating disease of central nervous system, unspecified: Secondary | ICD-10-CM

## 2023-04-10 DIAGNOSIS — M79604 Pain in right leg: Secondary | ICD-10-CM | POA: Insufficient documentation

## 2023-04-10 DIAGNOSIS — Z79899 Other long term (current) drug therapy: Secondary | ICD-10-CM | POA: Diagnosis present

## 2023-04-10 DIAGNOSIS — M51379 Other intervertebral disc degeneration, lumbosacral region without mention of lumbar back pain or lower extremity pain: Secondary | ICD-10-CM

## 2023-04-10 DIAGNOSIS — G894 Chronic pain syndrome: Secondary | ICD-10-CM

## 2023-04-10 DIAGNOSIS — M5137 Other intervertebral disc degeneration, lumbosacral region: Secondary | ICD-10-CM | POA: Diagnosis present

## 2023-04-10 DIAGNOSIS — M533 Sacrococcygeal disorders, not elsewhere classified: Secondary | ICD-10-CM | POA: Insufficient documentation

## 2023-04-10 DIAGNOSIS — M47816 Spondylosis without myelopathy or radiculopathy, lumbar region: Secondary | ICD-10-CM

## 2023-04-10 DIAGNOSIS — M5441 Lumbago with sciatica, right side: Secondary | ICD-10-CM | POA: Diagnosis present

## 2023-04-10 DIAGNOSIS — G35 Multiple sclerosis: Secondary | ICD-10-CM

## 2023-04-10 MED ORDER — OXYCODONE HCL 5 MG PO TABS: 5.0000 mg | ORAL_TABLET | Freq: Four times a day (QID) | ORAL | 0 refills | Status: DC | PRN

## 2023-04-10 MED ORDER — NALOXONE HCL 4 MG/0.1ML NA LIQD: 1.0000 | NASAL | 0 refills | Status: DC | PRN

## 2023-04-10 NOTE — Progress Notes (Signed)
PROVIDER NOTE: Information contained herein reflects review and annotations entered in association with encounter. Interpretation of such information and data should be left to medically-trained personnel. Information provided to patient can be located elsewhere in the medical record under "Patient Instructions". Document created using STT-dictation technology, any transcriptional errors that may result from process are unintentional.    Patient: Shannon Benitez  Service Category: E/M  Provider: Oswaldo Done, MD  DOB: 27-Jan-1971  DOS: 04/10/2023  Referring Provider: Jenell Milliner, MD  MRN: [id number]  Specialty: Interventional Pain Management  PCP: Jenell Milliner, MD  Type: Established Patient  Setting: Ambulatory outpatient    Location: Office  Delivery: Face-to-face     HPI  [name], a 52 y.o. year old female, is here today because of her Multiple sclerosis (HCC) [G35]. [name] primary complain today is Back Pain (lower)  Pertinent problems: [name] has Multiple sclerosis (HCC); Neurogenic pain; Chronic sacroiliac joint pain (Bilateral) (L>R); Chronic pain syndrome; Chronic low back pain (1ry area of Pain) (Bilateral) (L>R) w/ sciatica (Bilateral); Chronic lower extremity pain (2ry area of Pain) (Bilateral) (R>L); Chronic low back pain (Bilateral) (L>R) w/o sciatica; Muscle spasticity; Spondylosis without myelopathy or radiculopathy, lumbosacral region; Lumbar facet syndrome (Bilateral) (R>L); Abnormal MRI, lumbar spine (06/16/2018); Lumbar facet hypertrophy (Multilevel) (Bilateral); Osteoarthritis of facet joint of lumbar spine; Osteoarthritis of lumbar spine; DDD (degenerative disc disease), lumbosacral; Lumbar spondylosis; Diabetic peripheral neuropathy (HCC); 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); Abnormal MRI, cervical spine (12/27/20); Scapulalgia (Left); Traumatic injury of scapula (Left); Other spondylosis, sacral and sacrococcygeal  region; Acute exacerbation of chronic low back pain; Chronic knee pain (Bilateral); Osteoarthritis of knee (Right); Osteochondroma of proximal fibula (Right); Osteochondroma of proximal tibia (Right); Osteoarthritis involving multiple joints; Abnormal MRI, thoracic spine (12/27/2020); Chronic hip pain (Bilateral); Osteoarthritis of hips (Bilateral); Fibromyalgia; Intractable low back pain; Myofascial pain syndrome of lumbar spine; and Demyelinating disease of central nervous system, unspecified (HCC) on their pertinent problem list. Pain Assessment: Severity of Chronic pain is reported as a 10-Worst pain ever/10. Location: Back Left, Right/pain radiaties down both leg to her knee. Onset: More than a month ago. Quality: Aching, Burning, Constant, Throbbing, Stabbing, Sharp. Timing: Constant. Modifying factor(s): sitting, meds. Vitals:  height is 5\' 1"  (1.549 m) and weight is 230 lb (104.3 kg). Her temperature is 97.4 F (36.3 C) (abnormal). Her blood pressure is 131/78 and her pulse is 67. Her oxygen saturation is 95%.  BMI: Estimated body mass index is 43.46 kg/m as calculated from the following:   Height as of this encounter: 5\' 1"  (1.549 m).   Weight as of this encounter: 230 lb (104.3 kg). Last encounter: 12/26/2022. Last procedure: 10/25/2022.  Reason for encounter: medication management.  The patient indicates doing well with the current medication regimen. No adverse reactions or side effects reported to the medications.  Today the patient had several questions regarding the possibility of a spinal surgery to correct all of her pain.  Based on the available information, I told her that at this point in time she did not seem to be a candidate for any type of decompressive surgery.  I went ahead and reviewed the results of the MRIs that she had done on 01/30/2023.  I did notice that even though the patient's worst area of pain is out of the lower back and lower extremities, an MRI of the lumbar area was  not included.  The last available MRI of the lumbar spine was done on 09/22/2020.  That  MRI did not show any advanced findings.  It did show some mild degenerative desiccation and annular bulging at L4-5 and L5-S1 but no herniation or compressive narrowing of the canal or foramina.  There was also no evidence of facet arthropathy.  Today she refers her primary area of pain to be that of the lower back, bilaterally, with pain going down both lower extremities through the back of the leg all the way down into the ankle, but no pain going into her feet.  Today I will be ordering a repeat MRI of the lumbar spine to see if we have had any interval changes between the last MRI in 2021 and now.  In the past we have done lumbar facet blocks and even radiofrequency ablation of the lumbar facets, the last of which was done on 10/25/2022.  Postprocedure follow-up done on 12/05/2022 indicated the patient to have attained 100% relief of the pain for the duration of the local anesthetic, but with no long-term benefit.  Part of the issue is the fact that currently the patient has a BMI of 43.46 kg/m and despite the fact that in multiple locations we have told the patient that she needs to bring down her weight in order to get this pain under control, she has not been able to do that.  Today I will go ahead and order that MRI, but I have tried to made it clear to the patient that the single most important factor in getting her better would not be to have an unnecessary surgery or to continue doing any type of procedures that are not being helpful to her, but the single most important thing is that she bring her BMI down to less than 30.  RTCB: 07/12/2023   Pharmacotherapy Assessment  Analgesic: Oxycodone IR 5 mg, 1 tab PO QID from an initial BID (10 mg/day of oxycodone) (now at 20 mg/day) MME/day: 30 mg/day.   Monitoring: [place] PMP: PDMP reviewed during this encounter.       Pharmacotherapy: No side-effects or adverse  reactions reported. Compliance: No problems identified. Effectiveness: Clinically acceptable.  Brigitte Pulse, RN  04/10/2023  2:49 PM  Sign when Signing Visit Nursing Pain Medication Assessment:  Safety precautions to be maintained throughout the outpatient stay will include: orient to surroundings, keep bed in low position, maintain call bell within reach at all times, provide assistance with transfer out of bed and ambulation.  Medication Inspection Compliance: Pill count conducted under aseptic conditions, in front of the patient. Neither the pills nor the bottle was removed from the patient's sight at any time. Once count was completed pills were immediately returned to the patient in their original bottle.  Medication: Oxycodone IR Pill/Patch Count:  30 of 120 pills remain Pill/Patch Appearance: Markings consistent with prescribed medication Bottle Appearance: Standard pharmacy container. Clearly labeled. Filled Date: 4 / 19 / 2024 Last Medication intake:  TodaySafety precautions to be maintained throughout the outpatient stay will include: orient to surroundings, keep bed in low position, maintain call bell within reach at all times, provide assistance with transfer out of bed and ambulation.     No results found for: "CBDTHCR" No results found for: "D8THCCBX" No results found for: "D9THCCBX"  UDS:  Summary  Date Value Ref Range Status  12/26/2022 Note  Corrected    Comment:    ==================================================================== [employer] Select 13 (MW) ==================================================================== Test  Result       Flag       Units  Drug Present and Declared for Prescription Verification   Oxycodone                      2390         EXPECTED   ng/mg creat   Noroxycodone                   5988         EXPECTED   ng/mg creat   Noroxymorphone                 61           EXPECTED   ng/mg creat    Sources of  oxycodone include scheduled prescription medications.    Noroxycodone and noroxymorphone are expected metabolites of    oxycodone.    Phenobarbital                  PRESENT      EXPECTED    Phenobarbital is an expected metabolite of primidone; Phenobarbital    may also be administered as a prescription drug.  ==================================================================== Test                      Result    Flag   Units      Ref Range   Creatinine              163              mg/dL      >=16 ==================================================================== Declared Medications:  The flagging and interpretation on this report are based on the  following declared medications.  Unexpected results may arise from  inaccuracies in the declared medications.   **Note: The testing scope of this panel includes these medications:   Oxycodone (OxyIR)  Primidone (Mysoline)   **Note: The testing scope of this panel does not include the  following reported medications:   Omeprazole (Prilosec)  Pregabalin (Lyrica)  Propranolol (Inderal)  Sertraline (Zoloft)  Sitagliptin (Januvia) ==================================================================== For clinical consultation, please call [phone]. ====================================================================       ROS  Constitutional: Denies any fever or chills Gastrointestinal: No reported hemesis, hematochezia, vomiting, or acute GI distress Musculoskeletal: Denies any acute onset joint swelling, redness, loss of ROM, or weakness Neurological: No reported episodes of acute onset apraxia, aphasia, dysarthria, agnosia, amnesia, paralysis, loss of coordination, or loss of consciousness  Medication Review  Dimethyl Fumarate, aspirin-acetaminophen-caffeine, baclofen, lamoTRIgine, levETIRAcetam, loperamide, meloxicam, metFORMIN, mirabegron ER, naloxone, omeprazole, oxyCODONE, pregabalin, primidone, propranolol, sertraline,  and sitaGLIPtin  History Review  Allergy: [name] is allergic to 5-alpha reductase inhibitors, desvenlafaxine, glatiramer, rebif [interferon beta-1a], gramineae pollens, and latex. Drug: [name]  has no history on file for drug use. Alcohol:  reports no history of alcohol use. Tobacco:  reports that she quit smoking about 6 years ago. Her smoking use included cigarettes. She has a 9.00 pack-year smoking history. She has quit using smokeless tobacco. Social: [name]  reports that she quit smoking about 6 years ago. Her smoking use included cigarettes. She has a 9.00 pack-year smoking history. She has quit using smokeless tobacco. She reports that she does not drink alcohol. Medical:  has a past medical history of Acute postoperative pain (04/30/2019), Diabetes mellitus without complication (HCC), GERD (gastroesophageal reflux disease), Headache, MS (multiple sclerosis) (HCC), Numbness and tingling of both legs below knees,  Osteoarthritis, Vertigo, and Wears dentures. Surgical: [name]  has a past surgical history that includes Abdominal hysterectomy; Cesarean section; Tumor removal (Left); Cataract extraction w/PHACO (Right, 03/18/2019); and Cataract extraction w/PHACO (Left, 04/13/2019). Family: family history is not on file. She was adopted.  Laboratory Chemistry Profile   Renal Lab Results  Component Value Date   BUN 19 10/01/2022   CREATININE 0.59 10/01/2022   BCR 13 11/27/2018   GFRAA >60 09/25/2019   GFRNONAA >60 10/01/2022    Hepatic Lab Results  Component Value Date   AST 18 10/01/2022   ALT 14 10/01/2022   ALBUMIN 4.0 10/01/2022   ALKPHOS 91 10/01/2022    Electrolytes Lab Results  Component Value Date   NA 138 10/01/2022   K 3.4 (L) 10/01/2022   CL 103 10/01/2022   CALCIUM 9.7 10/01/2022   MG 2.2 11/27/2018    Bone Lab Results  Component Value Date   25OHVITD1 55 11/27/2018   25OHVITD2 <1.0 11/27/2018   25OHVITD3 55 11/27/2018     Inflammation (CRP: Acute Phase) (ESR: Chronic Phase) Lab Results  Component Value Date   CRP 10 11/27/2018   ESRSEDRATE 42 (H) 11/27/2018         Note: Above Lab results reviewed.  Recent Imaging Review  DG PAIN CLINIC C-ARM 1-60 MIN NO REPORT Fluoro was used, but no Radiologist interpretation will be provided.  Please refer to "NOTES" tab for provider progress note. Note: Reviewed        Physical Exam  General appearance: Well nourished, well developed, and well hydrated. In no apparent acute distress Mental status: Alert, oriented x 3 (person, place, & time)       Respiratory: No evidence of acute respiratory distress Eyes: PERLA Vitals: BP 131/78   Pulse 67   Temp (!) 97.4 F (36.3 C)   Ht 5\' 1"  (1.549 m)   Wt 230 lb (104.3 kg)   SpO2 95%   BMI 43.46 kg/m  BMI: Estimated body mass index is 43.46 kg/m as calculated from the following:   Height as of this encounter: 5\' 1"  (1.549 m).   Weight as of this encounter: 230 lb (104.3 kg). Ideal: Ideal body weight: 47.8 kg (105 lb 6.1 oz) Adjusted ideal body weight: 70.4 kg (155 lb 3.6 oz)  Assessment   Diagnosis Status  1. Multiple sclerosis (HCC)   2. DDD (degenerative disc disease), lumbosacral   3. Chronic sacroiliac joint pain (Bilateral) (L>R)   4. Lumbar facet syndrome (Bilateral) (R>L)   5. Chronic knee pain (Bilateral)   6. Chronic lower extremity pain (2ry area of Pain) (Bilateral) (R>L)   7. Chronic hip pain (Bilateral)   8. Demyelinating disease of central nervous system, unspecified (HCC)   9. Chronic pain syndrome   10. Pharmacologic therapy   11. Chronic use of opiate for therapeutic purpose   12. Encounter for medication management   13. Encounter for chronic pain management   14. Abnormal MRI, lumbar spine (06/16/2018)   15. Chronic low back pain (1ry area of Pain) (Bilateral) (L>R) w/ sciatica (Bilateral)   16. Lumbar facet hypertrophy (Multilevel) (Bilateral)    Controlled Controlled Controlled    Updated Problems: Problem  Demyelinating Disease of Central Nervous System, Unspecified (Hcc)    Plan of Care  Problem-specific:  No problem-specific Assessment & Plan notes found for this encounter.  [name] has a current medication list which includes the following long-term medication(s): baclofen, lamotrigine, levetiracetam, meloxicam, omeprazole, oxycodone, pregabalin, primidone, propranolol, sitagliptin, baclofen,  levetiracetam, meloxicam, metformin, naloxone, omeprazole, [START ON 04/13/2023] oxycodone, [START ON 05/13/2023] oxycodone, [START ON 06/12/2023] oxycodone, pregabalin, primidone, and sitagliptin.  Pharmacotherapy (Medications Ordered): Meds ordered this encounter  Medications   oxyCODONE (OXY IR/ROXICODONE) 5 MG immediate release tablet    Sig: Take 1 tablet (5 mg total) by mouth every 6 (six) hours as needed for severe pain. Must last 30 days.    Dispense:  120 tablet    Refill:  0    DO NOT: delete (not duplicate); no partial-fill (will deny script to complete), no refill request (F/U required). DISPENSE: 1 day early if closed on fill date. WARN: No CNS-depressants within 8 hrs of med.   oxyCODONE (OXY IR/ROXICODONE) 5 MG immediate release tablet    Sig: Take 1 tablet (5 mg total) by mouth every 6 (six) hours as needed for severe pain. Must last 30 days.    Dispense:  120 tablet    Refill:  0    DO NOT: delete (not duplicate); no partial-fill (will deny script to complete), no refill request (F/U required). DISPENSE: 1 day early if closed on fill date. WARN: No CNS-depressants within 8 hrs of med.   oxyCODONE (OXY IR/ROXICODONE) 5 MG immediate release tablet    Sig: Take 1 tablet (5 mg total) by mouth every 6 (six) hours as needed for severe pain. Must last 30 days.    Dispense:  120 tablet    Refill:  0    DO NOT: delete (not duplicate); no partial-fill (will deny script to complete), no refill request (F/U required). DISPENSE: 1 day early if closed on  fill date. WARN: No CNS-depressants within 8 hrs of med.   naloxone (NARCAN) nasal spray 4 mg/0.1 mL    Sig: Place 1 spray into the nose as needed for up to 365 doses (for opioid-induced respiratory depresssion). In case of emergency (overdose), spray once into each nostril. If no response within 3 minutes, repeat application and call 911.    Dispense:  1 each    Refill:  0    Instruct patient in proper use of device.   Orders:  Orders Placed This Encounter  Procedures   MR LUMBAR SPINE WO CONTRAST    Patient presents with axial pain with possible radicular component. Please assist [place] in identifying specific level(s) and laterality of any additional findings such as: 1. Facet (Zygapophyseal) joint DJD (Hypertrophy, space narrowing, subchondral sclerosis, and/or osteophyte formation) 2. DDD and/or IVDD (Loss of disc height, desiccation, gas patterns, osteophytes, endplate sclerosis, or "Black disc disease") 3. Pars defects 4. Spondylolisthesis, spondylosis, and/or spondyloarthropathies (include Degree/Grade of displacement in mm) (stability) 5. Vertebral body Fractures (acute/chronic) (state percentage of collapse) 6. Demineralization (osteopenia/osteoporotic) 7. Bone pathology 8. Foraminal narrowing  9. Surgical changes 10. Central, Lateral Recess, and/or Foraminal Stenosis (include AP diameter of stenosis in mm) 11. Surgical changes (hardware type, status, and presence of fibrosis) 12. Modic Type Changes (MRI only) 13. IVDD (Disc bulge, protrusion, herniation, extrusion) (Level, laterality, extent)    Standing Status:   Future    Standing Expiration Date:   05/10/2023    Scheduling Instructions:     Please make sure that the patient understands that this needs to be done as soon as possible. Never have the patient do the imaging "just before the next appointment". Inform patient that having the imaging done within the [facility] will expedite the availability of the results and will  provide      imaging availability to the requesting  physician. In addition inform the patient that the imaging order has an expiration date and will not be renewed if not done within the active period.    Order Specific Question:   What is the patient's sedation requirement?    Answer:   No Sedation    Order Specific Question:   Does the patient have a pacemaker or implanted devices?    Answer:   No    Order Specific Question:   Preferred imaging location?    Answer:   [facility] (table limit-350lbs)    Order Specific Question:   Call Results- Best Contact Number?    Answer:   [phone] [facility])    Order Specific Question:   Radiology Contrast Protocol - do NOT remove file path    Answer:   [email or web address]   Follow-up plan:   Return in about 3 months (around 07/12/2023) for Eval-day (M,W), (F2F), (MM), for review of ordered tests.      Interventional Therapies  Risk Factors  Considerations:   Allergy: LATEX NOTE: NO RFA until BMI is <30  Continue encouraging the patient to bring her BMI below 35.   Planned  Pending:   Therapeutic bilateral lumbar facet MBB #7 (#3 of 2023) (10/04/2022) NO-SHOW to pain management appointment for procedure: Bilateral lumbar facet block.  Did not call to cancel.   Under consideration:   Diagnostic bilateral T11-12 lumbar facet block   Diagnostic right T7-8 TESI   Diagnostic right T7 TFESI   Diagnostic ML CESI   Possible candidate for intrathecal pump trial and implant    Completed:   Palliative bilateral lumbar facet MBB x7 (10/25/2022) (100/95/95) Therapeutic left lumbar facet RFA x3 (05/24/2020)  Therapeutic right lumbar facet RFA x3 (04/07/2020)  (08/10/2020) referral to bariatric surgery and medical weight management    Completed by other providers:   Therapeutic/diagnostic bilateral SI Blk x1 (09/05/2021) by Sarajane Marek, MD (PMR)  Diagnostic/therapeutic bilateral L4/5 TFESI x1  (09/12/2017) by Karma Greaser, MD [facility] PM & NS)  Diagnostic/therapeutic bilateral SI Blk x1 (01/30/2017) by Karma Greaser, MD [facility] PM & NS)    Therapeutic  Palliative (PRN) options:   Palliative bilateral lumbar facet MBB   Palliative left lumbar facet RFA  Palliative right lumbar facet RFA     Pharmacotherapy  Nonopioids transfer 08/10/2020: Mobic, baclofen, and Lyrica.       Recent Visits No visits were found meeting these conditions. Showing recent visits within past 90 days and meeting all other requirements Today's Visits Date Type Provider Dept  04/10/23 Office Visit Delano Metz, MD [facility]  Showing today's visits and meeting all other requirements Future Appointments No visits were found meeting these conditions. Showing future appointments within next 90 days and meeting all other requirements  I discussed the assessment and treatment plan with the patient. The patient was provided an opportunity to ask questions and all were answered. The patient agreed with the plan and demonstrated an understanding of the instructions.  Patient advised to call back or seek an in-person evaluation if the symptoms or condition worsens.  Duration of encounter: 30 minutes.  Total time on encounter, as per AMA guidelines included both the face-to-face and non-face-to-face time personally spent by the physician and/or other qualified health care professional(s) on the day of the encounter (includes time in activities that require the physician or other qualified health care professional and does not include time in activities normally performed by clinical staff). Physician's time may include the following activities  when performed: Preparing to see the patient (e.g., pre-charting review of records, searching for previously ordered imaging, lab work, and nerve conduction tests) Review of prior analgesic pharmacotherapies. Reviewing PMP Interpreting ordered tests (e.g., lab  work, imaging, nerve conduction tests) Performing post-procedure evaluations, including interpretation of diagnostic procedures Obtaining and/or reviewing separately obtained history Performing a medically appropriate examination and/or evaluation Counseling and educating the patient/family/caregiver Ordering medications, tests, or procedures Referring and communicating with other health care professionals (when not separately reported) Documenting clinical information in the electronic or other health record Independently interpreting results (not separately reported) and communicating results to the patient/ family/caregiver Care coordination (not separately reported)  Note by: Oswaldo Done, MD Date: 04/10/2023; Time: 3:58 PM

## 2023-04-10 NOTE — Patient Instructions (Signed)
____________________________________________________________________________________________  Naloxone Nasal Spray  Why am I receiving this medication? [place] STOP ACT requires that all patients taking high dose opioids or at risk of opioids respiratory depression, be prescribed an opioid reversal agent, such as Naloxone (AKA: Narcan).  What is this medication? NALOXONE (nal OX one) treats opioid overdose, which causes slow or shallow breathing, severe drowsiness, or trouble staying awake. Call emergency services after using this medication. You may need additional treatment. Naloxone works by reversing the effects of opioids. It belongs to a group of medications called opioid blockers.  COMMON BRAND NAME(S): Kloxxado, Narcan  What should I tell my care team before I take this medication? They need to know if you have any of these conditions: Heart disease Substance use disorder An unusual or allergic reaction to naloxone, other medications, foods, dyes, or preservatives Pregnant or trying to get pregnant Breast-feeding  When to use this medication? This medication is to be used for the treatment of respiratory depression (less than 8 breaths per minute) secondary to opioid overdose.   How to use this medication? This medication is for use in the nose. Lay the person on their back. Support their neck with your hand and allow the head to tilt back before giving the medication. The nasal spray should be given into 1 nostril. After giving the medication, move the person onto their side. Do not remove or test the nasal spray until ready to use. Get emergency medical help right away after giving the first dose of this medication, even if the person wakes up. You should be familiar with how to recognize the signs and symptoms of a narcotic overdose. If more doses are needed, give the additional dose in the other nostril. Talk to your care team about the use of this medication in children.  While this medication may be prescribed for children as young as newborns for selected conditions, precautions do apply.  Naloxone Overdosage: If you think you have taken too much of this medicine contact a poison control center or emergency room at once.  NOTE: This medicine is only for you. Do not share this medicine with others.  What if I miss a dose? This does not apply.  What may interact with this medication? This is only used during an emergency. No interactions are expected during emergency use. This list may not describe all possible interactions. Give your health care provider a list of all the medicines, herbs, non-prescription drugs, or dietary supplements you use. Also tell them if you smoke, drink alcohol, or use illegal drugs. Some items may interact with your medicine.  What should I watch for while using this medication? Keep this medication ready for use in the case of an opioid overdose. Make sure that you have the phone number of your care team and local hospital ready. You may need to have additional doses of this medication. Each nasal spray contains a single dose. Some emergencies may require additional doses. After use, bring the treated person to the nearest hospital or call 911. Make sure the treating care team knows that the person has received a dose of this medication. You will receive additional instructions on what to do during and after use of this medication before an emergency occurs.  What side effects may I notice from receiving this medication? Side effects that you should report to your care team as soon as possible: Allergic reactions--skin rash, itching, hives, swelling of the face, lips, tongue, or throat Side effects that usually   do not require medical attention (report these to your care team if they continue or are bothersome): Constipation Dryness or irritation inside the nose Headache Increase in blood pressure Muscle spasms Stuffy  nose Toothache This list may not describe all possible side effects. Call your doctor for medical advice about side effects. You may report side effects to FDA at 1-800-FDA-1088.  Where should I keep my medication? Because this is an emergency medication, you should keep it with you at all times.  Keep out of the reach of children and pets. Store between 20 and 25 degrees C (68 and 77 degrees F). Do not freeze. Throw away any unused medication after the expiration date. Keep in original box until ready to use.  NOTE: This sheet is a summary. It may not cover all possible information. If you have questions about this medicine, talk to your doctor, pharmacist, or health care provider.   2023 Elsevier/Gold Standard (2021-06-30 00:00:00)  ____________________________________________________________________________________________   ____________________________________________________________________________________________  Opioid Pain Medication Update  To: All patients taking opioid pain medications. (I.e.: hydrocodone, hydromorphone, oxycodone, oxymorphone, morphine, codeine, methadone, tapentadol, tramadol, buprenorphine, fentanyl, etc.)  Re: Updated review of side effects and adverse reactions of opioid analgesics, as well as new information about long term effects of this class of medications.  Direct risks of long-term opioid therapy are not limited to opioid addiction and overdose. Potential medical risks include serious fractures, breathing problems during sleep, hyperalgesia, immunosuppression, chronic constipation, bowel obstruction, myocardial infarction, and tooth decay secondary to xerostomia.  Unpredictable adverse effects that can occur even if you take your medication correctly: Cognitive impairment, respiratory depression, and death. Most people think that if they take their medication "correctly", and "as instructed", that they will be safe. Nothing could be farther from the  truth. In reality, a significant amount of recorded deaths associated with the use of opioids has occurred in individuals that had taken the medication for a long time, and were taking their medication correctly. The following are examples of how this can happen: Patient taking his/her medication for a long time, as instructed, without any side effects, is given a certain antibiotic or another unrelated medication, which in turn triggers a "Drug-to-drug interaction" leading to disorientation, cognitive impairment, impaired reflexes, respiratory depression or an untoward event leading to serious bodily harm or injury, including death.  Patient taking his/her medication for a long time, as instructed, without any side effects, develops an acute impairment of liver and/or kidney function. This will lead to a rapid inability of the body to breakdown and eliminate their pain medication, which will result in effects similar to an "overdose", but with the same medicine and dose that they had always taken. This again may lead to disorientation, cognitive impairment, impaired reflexes, respiratory depression or an untoward event leading to serious bodily harm or injury, including death.  A similar problem will occur with patients as they grow older and their liver and kidney function begins to decrease as part of the aging process.  Background information: Historically, the original case for using long-term opioid therapy to treat chronic noncancer pain was based on safety assumptions that subsequent experience has called into question. In 1996, the American Pain Society and the American Academy of Pain Medicine issued a consensus statement supporting long-term opioid therapy. This statement acknowledged the dangers of opioid prescribing but concluded that the risk for addiction was low; respiratory depression induced by opioids was short-lived, occurred mainly in opioid-naive patients, and was antagonized by pain;  tolerance   was not a common problem; and efforts to control diversion should not constrain opioid prescribing. This has now proven to be wrong. Experience regarding the risks for opioid addiction, misuse, and overdose in community practice has failed to support these assumptions.  According to the Centers for Disease Control and Prevention, fatal overdoses involving opioid analgesics have increased sharply over the past decade. Currently, more than 96,700 people die from drug overdoses every year. Opioids are a factor in 7 out of every 10 overdose deaths. Deaths from drug overdose have surpassed motor vehicle accidents as the leading cause of death for individuals between the ages of 35 and 54.  Clinical data suggest that neuroendocrine dysfunction may be very common in both men and women, potentially causing hypogonadism, erectile dysfunction, infertility, decreased libido, osteoporosis, and depression. Recent studies linked higher opioid dose to increased opioid-related mortality. Controlled observational studies reported that long-term opioid therapy may be associated with increased risk for cardiovascular events. Subsequent meta-analysis concluded that the safety of long-term opioid therapy in elderly patients has not been proven.   Side Effects and adverse reactions: Common side effects: Drowsiness (sedation). Dizziness. Nausea and vomiting. Constipation. Physical dependence -- Dependence often manifests with withdrawal symptoms when opioids are discontinued or decreased. Tolerance -- As you take repeated doses of opioids, you require increased medication to experience the same effect of pain relief. Respiratory depression -- This can occur in healthy people, especially with higher doses. However, people with COPD, asthma or other lung conditions may be even more susceptible to fatal respiratory impairment.  Uncommon side effects: An increased sensitivity to feeling pain and extreme response to  pain (hyperalgesia). Chronic use of opioids can lead to this. Delayed gastric emptying (the process by which the contents of your stomach are moved into your small intestine). Muscle rigidity. Immune system and hormonal dysfunction. Quick, involuntary muscle jerks (myoclonus). Arrhythmia. Itchy skin (pruritus). Dry mouth (xerostomia).  Long-term side effects: Chronic constipation. Sleep-disordered breathing (SDB). Increased risk of bone fractures. Hypothalamic-pituitary-adrenal dysregulation. Increased risk of overdose.  RISKS: Fractures and Falls:  Opioids increase the risk and incidence of falls. This is of particular importance in elderly patients.  Endocrine System:  Long-term administration is associated with endocrine abnormalities (endocrinopathies). (Also known as Opioid-induced Endocrinopathy) Influences on both the hypothalamic-pituitary-adrenal axis?and the hypothalamic-pituitary-gonadal axis have been demonstrated with consequent hypogonadism and adrenal insufficiency in both sexes. Hypogonadism and decreased levels of dehydroepiandrosterone sulfate have been reported in men and women. Endocrine effects include: Amenorrhoea in women (abnormal absence of menstruation) Reduced libido in both sexes Decreased sexual function Erectile dysfunction in men Hypogonadisms (decreased testicular function with shrinkage of testicles) Infertility Depression and fatigue Loss of muscle mass Anxiety Depression Immune suppression Hyperalgesia Weight gain Anemia Osteoporosis Patients (particularly women of childbearing age) should avoid opioids. There is insufficient evidence to recommend routine monitoring of asymptomatic patients taking opioids in the long-term for hormonal deficiencies.  Immune System: Human studies have demonstrated that opioids have an immunomodulating effect. These effects are mediated via opioid receptors both on immune effector cells and in the central  nervous system. Opioids have been demonstrated to have adverse effects on antimicrobial response and anti-tumour surveillance. Buprenorphine has been demonstrated to have no impact on immune function.  Opioid Induced Hyperalgesia: Human studies have demonstrated that prolonged use of opioids can lead to a state of abnormal pain sensitivity, sometimes called opioid induced hyperalgesia (OIH). Opioid induced hyperalgesia is not usually seen in the absence of tolerance to opioid analgesia. Clinically, hyperalgesia may be   diagnosed if the patient on long-term opioid therapy presents with increased pain. This might be qualitatively and anatomically distinct from pain related to disease progression or to breakthrough pain resulting from development of opioid tolerance. Pain associated with hyperalgesia tends to be more diffuse than the pre-existing pain and less defined in quality. Management of opioid induced hyperalgesia requires opioid dose reduction.  Cancer: Chronic opioid therapy has been associated with an increased risk of cancer among noncancer patients with chronic pain. This association was more evident in chronic strong opioid users. Chronic opioid consumption causes significant pathological changes in the small intestine and colon. Epidemiological studies have found that there is a link between opium dependence and initiation of gastrointestinal cancers. Cancer is the second leading cause of death after cardiovascular disease. Chronic use of opioids can cause multiple conditions such as GERD, immunosuppression and renal damage as well as carcinogenic effects, which are associated with the incidence of cancers.   Mortality: Long-term opioid use has been associated with increased mortality among patients with chronic non-cancer pain (CNCP).  Prescription of long-acting opioids for chronic noncancer pain was associated with a significantly increased risk of all-cause mortality, including deaths  from causes other than overdose.  Reference: Von Korff M, Kolodny A, Deyo RA, Chou R. Long-term opioid therapy reconsidered. Ann Intern Med. 2011 Sep 6;155(5):325-8. doi: 10.7326/[id number]. PMID: 21893626; PMCID: PMC3280085. Bedson J, Chen Y, Ashworth J, Hayward RA, Dunn KM, Jordan KP. Risk of adverse events in patients prescribed long-term opioids: A cohort study in the [place] Clinical Practice Research Datalink. Eur J Pain. 2019 May;23(5):908-922. doi: 10.1002/ejp.1357. Epub 2019 Jan 31. PMID: 30620116. Colameco S, Coren JS, Ciervo CA. Continuous opioid treatment for chronic noncancer pain: a time for moderation in prescribing. Postgrad Med. 2009 Jul;121(4):61-6. doi: 10.3810/pgm.2009.07.2032. PMID: 19641271. Chou R, Turner JA, Devine EB, Hansen RN, Sullivan SD, Blazina I, Dana T, Bougatsos C, Deyo RA. The effectiveness and risks of long-term opioid therapy for chronic pain: a systematic review for a National Institutes of Health Pathways to Prevention Workshop. Ann Intern Med. 2015 Feb 17;162(4):276-86. doi: 10.7326/M14-2559. PMID: 25581257. Warner M, Chen LH, Makuc DM. NCHS Data Brief No. 22. [place]: Centers for Disease Control and Prevention; 2009. Sep, Increase in Fatal Poisonings Involving Opioid Analgesics in the [place], 1999-2006. Song IA, Choi HR, Oh TK. Long-term opioid use and mortality in patients with chronic non-cancer pain: Ten-year follow-up study in [place] from 2010 through 2019. EClinicalMedicine. 2022 Jul 18;51:101558. doi: 10.1016/j.eclinm.2022.101558. PMID: 35875817; PMCID: PMC9304910. Huser, W., Schubert, T., Vogelmann, T. et al. All-cause mortality in patients with long-term opioid therapy compared with non-opioid analgesics for chronic non-cancer pain: a database study. BMC Med 18, 162 (2020). https://doi.org/10.1186/[email or web address] Rashidian H, Zendehdel K, Kamangar F, Malekzadeh R, Haghdoost AA. An Ecological Study of the Association between  Opiate Use and Incidence of Cancers. Addict Health. 2016 Fall;8(4):252-260. PMID: 28819556; PMCID: PMC5554805.  Our Goal: Our goal is to control your pain with means other than the use of opioid pain medications.  Our Recommendation: Talk to your physician about coming off of these medications. We can assist you with the tapering down and stopping these medicines. Based on the new information, even if you cannot completely stop the medication, a decrease in the dose may be associated with a lesser risk. Ask for other means of controlling the pain. Decrease or eliminate those factors that significantly contribute to your pain such as smoking, obesity, and a diet heavily tilted towards "inflammatory" nutrients.  Last Updated: 01/02/2023   ____________________________________________________________________________________________       ____________________________________________________________________________________________  Transfer of Pain Medication between Pharmacies  Re: 2023 DEA Clarification on existing regulation  Published on DEA Website: July 06, 2022  Title: Revised Regulation Allows DEA-Registered Pharmacies to Transfer Electronic Prescriptions at a Patient's Request DEA Headquarters Division - Public Information Office  "Patients now have the ability to request their electronic prescription be transferred to another pharmacy without having to go back to their practitioner to initiate the request. This revised regulation went into effect on Monday, July 02, 2022.     At a patient's request, a DEA-registered retail pharmacy can now transfer an electronic prescription for a controlled substance (schedules II-V) to another DEA-registered retail pharmacy. Prior to this change, patients would have to go through their practitioner to cancel their prescription and have it re-issued to a different pharmacy. The process was taxing and time consuming for both patients and  practitioners.    The Drug Enforcement Administration (DEA) published its intent to revise the process for transferring electronic prescriptions on September 23, 2020.  The final rule was published in the federal register on May 31, 2022 and went into effect 30 days later.  Under the final rule, a prescription can only be transferred once between pharmacies, and only if allowed under existing state or other applicable law. The prescription must remain in its electronic form; may not be altered in any way; and the transfer must be communicated directly between two licensed pharmacists. It's important to note, any authorized refills transfer with the original prescription, which means the entire prescription will be filled at the same pharmacy."    REFERENCES: 1. DEA website announcement https://www.dea.gov/stories/2023/2023-07/2022-09-01/revised-regulation-allows-dea-registered-pharmacies-transfer  2. Department of Justice website  https://www.govinfo.gov/content/pkg/FR-2022-05-31/pdf/2023-15847.pdf  3. DEPARTMENT OF JUSTICE Drug Enforcement Administration 21 CFR Part 1306 [Docket No. [id number]] RIN [id number] "Transfer of Electronic Prescriptions for Schedules II-V Controlled Substances Between Pharmacies for Initial Filling"  ____________________________________________________________________________________________     

## 2023-04-10 NOTE — Progress Notes (Signed)
Nursing Pain Medication Assessment:  Safety precautions to be maintained throughout the outpatient stay will include: orient to surroundings, keep bed in low position, maintain call bell within reach at all times, provide assistance with transfer out of bed and ambulation.  Medication Inspection Compliance: Pill count conducted under aseptic conditions, in front of the patient. Neither the pills nor the bottle was removed from the patient's sight at any time. Once count was completed pills were immediately returned to the patient in their original bottle.  Medication: Oxycodone IR Pill/Patch Count:  30 of 120 pills remain Pill/Patch Appearance: Markings consistent with prescribed medication Bottle Appearance: Standard pharmacy container. Clearly labeled. Filled Date: 4 / 61 / 2024 Last Medication intake:  TodaySafety precautions to be maintained throughout the outpatient stay will include: orient to surroundings, keep bed in low position, maintain call bell within reach at all times, provide assistance with transfer out of bed and ambulation.

## 2023-04-18 ENCOUNTER — Ambulatory Visit
Admission: RE | Admit: 2023-04-18 | Discharge: 2023-04-18 | Disposition: A | Discharge status: Home / Self Care | Payer: Medicaid Other | Source: Ambulatory Visit | Attending: Pain Medicine | Admitting: Pain Medicine

## 2023-04-18 DIAGNOSIS — M5441 Lumbago with sciatica, right side: Secondary | ICD-10-CM | POA: Diagnosis present

## 2023-04-18 DIAGNOSIS — M5442 Lumbago with sciatica, left side: Secondary | ICD-10-CM | POA: Insufficient documentation

## 2023-04-18 DIAGNOSIS — M25551 Pain in right hip: Secondary | ICD-10-CM | POA: Insufficient documentation

## 2023-04-18 DIAGNOSIS — M79605 Pain in left leg: Secondary | ICD-10-CM | POA: Insufficient documentation

## 2023-04-18 DIAGNOSIS — M79604 Pain in right leg: Secondary | ICD-10-CM | POA: Insufficient documentation

## 2023-04-18 DIAGNOSIS — G379 Demyelinating disease of central nervous system, unspecified: Secondary | ICD-10-CM | POA: Insufficient documentation

## 2023-04-18 DIAGNOSIS — M47816 Spondylosis without myelopathy or radiculopathy, lumbar region: Secondary | ICD-10-CM | POA: Insufficient documentation

## 2023-04-18 DIAGNOSIS — M25552 Pain in left hip: Secondary | ICD-10-CM | POA: Diagnosis present

## 2023-04-18 DIAGNOSIS — G8929 Other chronic pain: Secondary | ICD-10-CM | POA: Diagnosis present

## 2023-04-18 DIAGNOSIS — G35 Multiple sclerosis: Secondary | ICD-10-CM | POA: Insufficient documentation

## 2023-04-18 DIAGNOSIS — R937 Abnormal findings on diagnostic imaging of other parts of musculoskeletal system: Secondary | ICD-10-CM | POA: Diagnosis present

## 2023-04-18 DIAGNOSIS — M5137 Other intervertebral disc degeneration, lumbosacral region: Secondary | ICD-10-CM | POA: Insufficient documentation

## 2023-05-30 NOTE — Progress Notes (Signed)
(  06/03/2023) NO-SHOW to a requested evaluation to discuss having an epidural instead of the planned lumbar facet block.

## 2023-06-03 ENCOUNTER — Ambulatory Visit (HOSPITAL_BASED_OUTPATIENT_CLINIC_OR_DEPARTMENT_OTHER): Payer: MEDICAID | Admitting: Pain Medicine

## 2023-06-03 DIAGNOSIS — M5137 Other intervertebral disc degeneration, lumbosacral region: Secondary | ICD-10-CM

## 2023-06-03 DIAGNOSIS — G8929 Other chronic pain: Secondary | ICD-10-CM

## 2023-06-03 DIAGNOSIS — Z0189 Encounter for other specified special examinations: Secondary | ICD-10-CM

## 2023-06-03 DIAGNOSIS — Z91199 Patient's noncompliance with other medical treatment and regimen due to unspecified reason: Secondary | ICD-10-CM

## 2023-06-14 ENCOUNTER — Encounter: Payer: Self-pay | Admitting: Pain Medicine

## 2023-07-04 NOTE — Progress Notes (Unsigned)
PROVIDER NOTE: Information contained herein reflects review and annotations entered in association with encounter. Interpretation of such information and data should be left to medically-trained personnel. Information provided to patient can be located elsewhere in the medical record under "Patient Instructions". Document created using STT-dictation technology, any transcriptional errors that may result from process are unintentional.    Patient: Shannon Benitez  Service Category: E/M  Provider: Oswaldo Done, MD  DOB: [birth year]  DOS: 07/10/2023  Referring Provider: Jenell Milliner, MD  MRN: [id number]  Specialty: Interventional Pain Management  PCP: Shannon Milliner, MD  Type: Established Patient  Setting: Ambulatory outpatient    Location: Office  Delivery: Face-to-face     HPI  [name], a 52 y.o. year old female, is here today because of her Chronic pain syndrome [G89.4]. [name] primary complain today is No chief complaint on file.  Pertinent problems: Shannon Benitez has Multiple sclerosis (HCC); Neurogenic pain; Chronic sacroiliac joint pain (Bilateral) (L>R); Chronic pain syndrome; Chronic low back pain (1ry area of Pain) (Bilateral) (L>R) w/ sciatica (Bilateral); Chronic lower extremity pain (2ry area of Pain) (Bilateral) (R>L); Chronic low back pain (Bilateral) (L>R) w/o sciatica; Muscle spasticity; Spondylosis without myelopathy or radiculopathy, lumbosacral region; Lumbar facet syndrome (Bilateral) (R>L); Abnormal MRI, lumbar spine (06/16/2018); Lumbar facet hypertrophy (Multilevel) (Bilateral); Osteoarthritis of facet joint of lumbar spine; Osteoarthritis of lumbar spine; DDD (degenerative disc disease), lumbosacral; Lumbar spondylosis; Diabetic peripheral neuropathy (HCC); 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); Abnormal MRI, cervical spine (12/27/20); Scapulalgia (Left); Traumatic injury of scapula (Left); Other spondylosis, sacral and  sacrococcygeal region; Acute exacerbation of chronic low back pain; Chronic knee pain (Bilateral); Osteoarthritis of knee (Right); Osteochondroma of proximal fibula (Right); Osteochondroma of proximal tibia (Right); Osteoarthritis involving multiple joints; Abnormal MRI, thoracic spine (12/27/2020); Chronic hip pain (Bilateral); Osteoarthritis of hips (Bilateral); Fibromyalgia; Intractable low back pain; Myofascial pain syndrome of lumbar spine; and Demyelinating disease of central nervous system, unspecified (HCC) on their pertinent problem list. Pain Assessment: Severity of   is reported as a  /10. Location:    / . Onset:  . Quality:  . Timing:  . Modifying factor(s):  Marland Kitchen Vitals:  vitals were not taken for this visit.  BMI: Estimated body mass index is 43.46 kg/m as calculated from the following:   Height as of 04/10/23: 5\' 1"  (1.549 m).   Weight as of 04/10/23: 230 lb (104.3 kg). Last encounter: 06/03/2023. Last procedure: 10/25/2022.  Reason for encounter: medication management. ***  RTCB: 10/10/2023   Pharmacotherapy Assessment  Analgesic: Oxycodone IR 5 mg, 1 tab PO QID from an initial BID (10 mg/day of oxycodone) (now at 20 mg/day) MME/day: 30 mg/day.   Monitoring: Shannon Benitez PMP: PDMP reviewed during this encounter.       Pharmacotherapy: No side-effects or adverse reactions reported. Compliance: No problems identified. Effectiveness: Clinically acceptable.  No notes on file  No results found for: "CBDTHCR" No results found for: "D8THCCBX" No results found for: "D9THCCBX"  UDS:  Summary  Date Value Ref Range Status  12/26/2022 Note  Corrected    Comment:    ==================================================================== [employer] [facility] (MW) ==================================================================== Test                             Result       Flag       Units  Drug Present and Declared for Prescription Verification   Oxycodone  2390          EXPECTED   ng/mg creat   Noroxycodone                   5988         EXPECTED   ng/mg creat   Noroxymorphone                 61           EXPECTED   ng/mg creat    Sources of oxycodone include scheduled prescription medications.    Noroxycodone and noroxymorphone are expected metabolites of    oxycodone.    Phenobarbital                  PRESENT      EXPECTED    Phenobarbital is an expected metabolite of primidone; Phenobarbital    may also be administered as a prescription drug.  ==================================================================== Test                      Result    Flag   Units      Ref Range   Creatinine              163              mg/dL      >=40 ==================================================================== Declared Medications:  The flagging and interpretation on this report are based on the  following declared medications.  Unexpected results may arise from  inaccuracies in the declared medications.   **Note: The testing scope of this panel includes these medications:   Oxycodone (OxyIR)  Primidone (Mysoline)   **Note: The testing scope of this panel does not include the  following reported medications:   Omeprazole (Prilosec)  Pregabalin (Lyrica)  Propranolol (Inderal)  Sertraline (Zoloft)  Sitagliptin (Januvia) ==================================================================== For clinical consultation, please call [phone]. ====================================================================       ROS  Constitutional: Denies any fever or chills Gastrointestinal: No reported hemesis, hematochezia, vomiting, or acute GI distress Musculoskeletal: Denies any acute onset joint swelling, redness, loss of ROM, or weakness Neurological: No reported episodes of acute onset apraxia, aphasia, dysarthria, agnosia, amnesia, paralysis, loss of coordination, or loss of consciousness  Medication Review  Dimethyl Fumarate,  aspirin-acetaminophen-caffeine, baclofen, lamoTRIgine, levETIRAcetam, loperamide, meloxicam, metFORMIN, mirabegron ER, naloxone, omeprazole, oxyCODONE, pregabalin, primidone, propranolol, sertraline, and sitaGLIPtin  History Review  Allergy: [name] is allergic to 5-alpha reductase inhibitors, desvenlafaxine, glatiramer, rebif [interferon beta-1a], gramineae pollens, and latex. Drug: [name]  has no history on file for drug use. Alcohol:  reports no history of alcohol use. Tobacco:  reports that she quit smoking about 6 years ago. Her smoking use included cigarettes. She started smoking about 24 years ago. She has a 9 pack-year smoking history. She has quit using smokeless tobacco. Social: [name]  reports that she quit smoking about 6 years ago. Her smoking use included cigarettes. She started smoking about 24 years ago. She has a 9 pack-year smoking history. She has quit using smokeless tobacco. She reports that she does not drink alcohol. Medical:  has a past medical history of Acute postoperative pain (04/30/2019), Diabetes mellitus without complication (HCC), GERD (gastroesophageal reflux disease), Headache, MS (multiple sclerosis) (HCC), Numbness and tingling of both legs below knees, Osteoarthritis, Vertigo, and Wears dentures. Surgical: [name]  has a past surgical history that includes Abdominal hysterectomy; Cesarean section; Tumor removal (Left); Cataract extraction w/PHACO (Right, 03/18/2019); and Cataract extraction w/PHACO (Left,  04/13/2019). Family: family history is not on file. She was adopted.  Laboratory Chemistry Profile   Renal Lab Results  Component Value Date   BUN 19 10/01/2022   CREATININE 0.59 10/01/2022   BCR 13 11/27/2018   GFRAA >60 09/25/2019   GFRNONAA >60 10/01/2022    Hepatic Lab Results  Component Value Date   AST 18 10/01/2022   ALT 14 10/01/2022   ALBUMIN 4.0 10/01/2022   ALKPHOS 91 10/01/2022    Electrolytes Lab Results   Component Value Date   NA 138 10/01/2022   K 3.4 (L) 10/01/2022   CL 103 10/01/2022   CALCIUM 9.7 10/01/2022   MG 2.2 11/27/2018    Bone Lab Results  Component Value Date   25OHVITD1 55 11/27/2018   25OHVITD2 <1.0 11/27/2018   25OHVITD3 55 11/27/2018    Inflammation (CRP: Acute Phase) (ESR: Chronic Phase) Lab Results  Component Value Date   CRP 10 11/27/2018   ESRSEDRATE 42 (H) 11/27/2018         Note: Above Lab results reviewed.  Recent Imaging Review  MR LUMBAR SPINE WO CONTRAST CLINICAL DATA:  Low back pain for over 6 weeks  EXAM: MRI LUMBAR SPINE WITHOUT CONTRAST  TECHNIQUE: Multiplanar, multisequence MR imaging of the lumbar spine was performed. No intravenous contrast was administered.  COMPARISON:  09/22/2020  FINDINGS: Segmentation:  Standard.  Alignment:  2 mm retrolisthesis of L5 on S1.  Vertebrae: No acute fracture, evidence of discitis, or aggressive bone lesion.  Conus medullaris and cauda equina: Conus extends to the L2 level. Conus and cauda equina appear normal.  Paraspinal and other soft tissues: No acute paraspinal abnormality.  Disc levels:  Disc spaces: Disc desiccation at L3-4, L4-5 and L5-S1.  T12-L1: No significant disc bulge. No neural foraminal stenosis. No central canal stenosis.  L1-L2: No significant disc bulge. Mild bilateral facet arthropathy. No foraminal or central canal stenosis.  L2-L3: No significant disc bulge. Mild bilateral facet arthropathy. No foraminal or central canal stenosis.  L3-L4: No significant disc bulge. Mild bilateral facet arthropathy. No foraminal or central canal stenosis.  L4-L5: Mild broad-based disc bulge with a small shallow central disc protrusion. Mild bilateral facet arthropathy. No foraminal or central canal stenosis.  L5-S1: Mild broad-based disc bulge. Mild bilateral facet arthropathy. No foraminal or central canal stenosis.  IMPRESSION: 1. Mild lumbar spine spondylosis as  described above without significant interval change compared with 09/22/2020. 2. No acute osseous injury of the lumbar spine.  Electronically Signed   By: Elige Ko M.D.   On: 04/27/2023 07:23 Note: Reviewed        Physical Exam  General appearance: Well nourished, well developed, and well hydrated. In no apparent acute distress Mental status: Alert, oriented x 3 (person, place, & time)       Respiratory: No evidence of acute respiratory distress Eyes: PERLA Vitals: There were no vitals taken for this visit. BMI: Estimated body mass index is 43.46 kg/m as calculated from the following:   Height as of 04/10/23: 5\' 1"  (1.549 m).   Weight as of 04/10/23: 230 lb (104.3 kg). Ideal: Patient weight not recorded  Assessment   Diagnosis Status  1. Chronic pain syndrome   2. Chronic low back pain (1ry area of Pain) (Bilateral) (L>R) w/ sciatica (Bilateral)   3. Chronic lower extremity pain (2ry area of Pain) (Bilateral) (R>L)   4. Multiple sclerosis (HCC)   5. DDD (degenerative disc disease), lumbosacral   6. Chronic sacroiliac joint pain (Bilateral) (  L>R)   7. Lumbar facet syndrome (Bilateral) (R>L)   8. Chronic knee pain (Bilateral)   9. Chronic hip pain (Bilateral)   10. Pharmacologic therapy   11. Chronic use of opiate for therapeutic purpose   12. Encounter for medication management   13. Encounter for chronic pain management    Controlled Controlled Controlled   Updated Problems: No problems updated.  Plan of Care  Problem-specific:  No problem-specific Assessment & Plan notes found for this encounter.  [name] has a current medication list which includes the following long-term medication(s): baclofen, baclofen, lamotrigine, levetiracetam, levetiracetam, meloxicam, meloxicam, metformin, naloxone, omeprazole, omeprazole, oxycodone, pregabalin, pregabalin, primidone, primidone, propranolol, sitagliptin, and sitagliptin.  Pharmacotherapy (Medications  Ordered): No orders of the defined types were placed in this encounter.  Orders:  No orders of the defined types were placed in this encounter.  Follow-up plan:   No follow-ups on file.      Interventional Therapies  Risk Factors  Considerations:   Allergy: LATEX NOTE: NO RFA until BMI is <30  Continue encouraging the patient to bring her BMI below 35.   Planned  Pending:   Therapeutic bilateral lumbar facet MBB #7 (#3 of 2023) (10/04/2022) NO-SHOW to pain management appointment for procedure: Bilateral lumbar facet block.  Did not call to cancel.   Under consideration:   Diagnostic bilateral T11-12 lumbar facet block   Diagnostic right T7-8 TESI   Diagnostic right T7 TFESI   Diagnostic ML CESI   Possible candidate for intrathecal pump trial and implant    Completed:   Palliative bilateral lumbar facet MBB x7 (10/25/2022) (100/95/95) Therapeutic left lumbar facet RFA x3 (05/24/2020)  Therapeutic right lumbar facet RFA x3 (04/07/2020)  (08/10/2020) referral to bariatric surgery and medical weight management    Completed by other providers:   Therapeutic/diagnostic bilateral SI Blk x1 (09/05/2021) by Sarajane Marek, MD (PMR)  Diagnostic/therapeutic bilateral L4/5 TFESI x1 (09/12/2017) by Karma Greaser, MD [facility])  Diagnostic/therapeutic bilateral SI Blk x1 (01/30/2017) by Karma Greaser, MD [facility])    Therapeutic  Palliative (PRN) options:   Palliative bilateral lumbar facet MBB   Palliative left lumbar facet RFA  Palliative right lumbar facet RFA     Pharmacotherapy  Nonopioids transfer 08/10/2020: Mobic, baclofen, and Lyrica.        Recent Visits Date Type Provider Dept  04/10/23 Office Visit Delano Metz, MD [facility]  Showing recent visits within past 90 days and meeting all other requirements Future Appointments Date Type Provider Dept  07/10/23 Appointment Delano Metz, MD [facility]  Showing future appointments  within next 90 days and meeting all other requirements  I discussed the assessment and treatment plan with the patient. The patient was provided an opportunity to ask questions and all were answered. The patient agreed with the plan and demonstrated an understanding of the instructions.  Patient advised to call back or seek an in-person evaluation if the symptoms or condition worsens.  Duration of encounter: *** minutes.  Total time on encounter, as per AMA guidelines included both the face-to-face and non-face-to-face time personally spent by the physician and/or other qualified health care professional(s) on the day of the encounter (includes time in activities that require the physician or other qualified health care professional and does not include time in activities normally performed by clinical staff). Physician's time may include the following activities when performed: Preparing to see the patient (e.g., pre-charting review of records, searching for previously  ordered imaging, lab work, and nerve conduction tests) Review of prior analgesic pharmacotherapies. Reviewing PMP Interpreting ordered tests (e.g., lab work, imaging, nerve conduction tests) Performing post-procedure evaluations, including interpretation of diagnostic procedures Obtaining and/or reviewing separately obtained history Performing a medically appropriate examination and/or evaluation Counseling and educating the patient/family/caregiver Ordering medications, tests, or procedures Referring and communicating with other health care professionals (when not separately reported) Documenting clinical information in the electronic or other health record Independently interpreting results (not separately reported) and communicating results to the patient/ family/caregiver Care coordination (not separately reported)  Note by: Shannon Done, MD Date: 07/10/2023; Time: 2:33 PM

## 2023-07-07 NOTE — Patient Instructions (Incomplete)
 ____________________________________________________________________________________________  Opioid Pain Medication Update  To: All patients taking opioid pain medications. (I.e.: hydrocodone, hydromorphone, oxycodone, oxymorphone, morphine, codeine, methadone, tapentadol, tramadol, buprenorphine, fentanyl, etc.)  Re: Updated review of side effects and adverse reactions of opioid analgesics, as well as new information about long term effects of this class of medications.  Direct risks of long-term opioid therapy are not limited to opioid addiction and overdose. Potential medical risks include serious fractures, breathing problems during sleep, hyperalgesia, immunosuppression, chronic constipation, bowel obstruction, myocardial infarction, and tooth decay secondary to xerostomia.  Unpredictable adverse effects that can occur even if you take your medication correctly: Cognitive impairment, respiratory depression, and death. Most people think that if they take their medication "correctly", and "as instructed", that they will be safe. Nothing could be farther from the truth. In reality, a significant amount of recorded deaths associated with the use of opioids has occurred in individuals that had taken the medication for a long time, and were taking their medication correctly. The following are examples of how this can happen: Patient taking his/her medication for a long time, as instructed, without any side effects, is given a certain antibiotic or another unrelated medication, which in turn triggers a "Drug-to-drug interaction" leading to disorientation, cognitive impairment, impaired reflexes, respiratory depression or an untoward event leading to serious bodily harm or injury, including death.  Patient taking his/her medication for a long time, as instructed, without any side effects, develops an acute impairment of liver and/or kidney function. This will lead to a rapid inability of the body to  breakdown and eliminate their pain medication, which will result in effects similar to an "overdose", but with the same medicine and dose that they had always taken. This again may lead to disorientation, cognitive impairment, impaired reflexes, respiratory depression or an untoward event leading to serious bodily harm or injury, including death.  A similar problem will occur with patients as they grow older and their liver and kidney function begins to decrease as part of the aging process.  Background information: Historically, the original case for using long-term opioid therapy to treat chronic noncancer pain was based on safety assumptions that subsequent experience has called into question. In 1996, the American Pain Society and the American Academy of Pain Medicine issued a consensus statement supporting long-term opioid therapy. This statement acknowledged the dangers of opioid prescribing but concluded that the risk for addiction was low; respiratory depression induced by opioids was short-lived, occurred mainly in opioid-naive patients, and was antagonized by pain; tolerance was not a common problem; and efforts to control diversion should not constrain opioid prescribing. This has now proven to be wrong. Experience regarding the risks for opioid addiction, misuse, and overdose in community practice has failed to support these assumptions.  According to the Centers for Disease Control and Prevention, fatal overdoses involving opioid analgesics have increased sharply over the past decade. Currently, more than 96,700 people die from drug overdoses every year. Opioids are a factor in 7 out of every 10 overdose deaths. Deaths from drug overdose have surpassed motor vehicle accidents as the leading cause of death for individuals between the ages of 80 and 61.  Clinical data suggest that neuroendocrine dysfunction may be very common in both men and women, potentially causing hypogonadism, erectile  dysfunction, infertility, decreased libido, osteoporosis, and depression. Recent studies linked higher opioid dose to increased opioid-related mortality. Controlled observational studies reported that long-term opioid therapy may be associated with increased risk for cardiovascular events. Subsequent meta-analysis concluded  that the safety of long-term opioid therapy in elderly patients has not been proven.   Side Effects and adverse reactions: Common side effects: Drowsiness (sedation). Dizziness. Nausea and vomiting. Constipation. Physical dependence -- Dependence often manifests with withdrawal symptoms when opioids are discontinued or decreased. Tolerance -- As you take repeated doses of opioids, you require increased medication to experience the same effect of pain relief. Respiratory depression -- This can occur in healthy people, especially with higher doses. However, people with COPD, asthma or other lung conditions may be even more susceptible to fatal respiratory impairment.  Uncommon side effects: An increased sensitivity to feeling pain and extreme response to pain (hyperalgesia). Chronic use of opioids can lead to this. Delayed gastric emptying (the process by which the contents of your stomach are moved into your small intestine). Muscle rigidity. Immune system and hormonal dysfunction. Quick, involuntary muscle jerks (myoclonus). Arrhythmia. Itchy skin (pruritus). Dry mouth (xerostomia).  Long-term side effects: Chronic constipation. Sleep-disordered breathing (SDB). Increased risk of bone fractures. Hypothalamic-pituitary-adrenal dysregulation. Increased risk of overdose.  RISKS: Respiratory depression and death: Opioids increase the risk of respiratory depression and death.  Drug-to-drug interactions: Opioids are relatively contraindicated in combination with benzodiazepines, sleep inducers, and other central nervous system depressants. Other classes of medications  (i.e.: certain antibiotics and even over-the-counter medications) may also trigger or induce respiratory depression in some patients.  Medical conditions: Patients with pre-existing respiratory problems are at higher risk of respiratory failure and/or depression when in combination with opioid analgesics. Opioids are relatively contraindicated in some medical conditions such as central sleep apnea.   Fractures and Falls:  Opioids increase the risk and incidence of falls. This is of particular importance in elderly patients.  Endocrine System:  Long-term administration is associated with endocrine abnormalities (endocrinopathies). (Also known as Opioid-induced Endocrinopathy) Influences on both the hypothalamic-pituitary-adrenal axis?and the hypothalamic-pituitary-gonadal axis have been demonstrated with consequent hypogonadism and adrenal insufficiency in both sexes. Hypogonadism and decreased levels of dehydroepiandrosterone sulfate have been reported in men and women. Endocrine effects include: Amenorrhoea in women (abnormal absence of menstruation) Reduced libido in both sexes Decreased sexual function Erectile dysfunction in men Hypogonadisms (decreased testicular function with shrinkage of testicles) Infertility Depression and fatigue Loss of muscle mass Anxiety Depression Immune suppression Hyperalgesia Weight gain Anemia Osteoporosis Patients (particularly women of childbearing age) should avoid opioids. There is insufficient evidence to recommend routine monitoring of asymptomatic patients taking opioids in the long-term for hormonal deficiencies.  Immune System: Human studies have demonstrated that opioids have an immunomodulating effect. These effects are mediated via opioid receptors both on immune effector cells and in the central nervous system. Opioids have been demonstrated to have adverse effects on antimicrobial response and anti-tumour surveillance. Buprenorphine has  been demonstrated to have no impact on immune function.  Opioid Induced Hyperalgesia: Human studies have demonstrated that prolonged use of opioids can lead to a state of abnormal pain sensitivity, sometimes called opioid induced hyperalgesia (OIH). Opioid induced hyperalgesia is not usually seen in the absence of tolerance to opioid analgesia. Clinically, hyperalgesia may be diagnosed if the patient on long-term opioid therapy presents with increased pain. This might be qualitatively and anatomically distinct from pain related to disease progression or to breakthrough pain resulting from development of opioid tolerance. Pain associated with hyperalgesia tends to be more diffuse than the pre-existing pain and less defined in quality. Management of opioid induced hyperalgesia requires opioid dose reduction.  Cancer: Chronic opioid therapy has been associated with an increased risk of cancer  among noncancer patients with chronic pain. This association was more evident in chronic strong opioid users. Chronic opioid consumption causes significant pathological changes in the small intestine and colon. Epidemiological studies have found that there is a link between opium dependence and initiation of gastrointestinal cancers. Cancer is the second leading cause of death after cardiovascular disease. Chronic use of opioids can cause multiple conditions such as GERD, immunosuppression and renal damage as well as carcinogenic effects, which are associated with the incidence of cancers.   Mortality: Long-term opioid use has been associated with increased mortality among patients with chronic non-cancer pain (CNCP).  Prescription of long-acting opioids for chronic noncancer pain was associated with a significantly increased risk of all-cause mortality, including deaths from causes other than overdose.  Reference: Von Korff M, Kolodny A, Deyo RA, Chou R. Long-term opioid therapy reconsidered. Ann Intern Med. 2011  Sep 6;155(5):325-8. doi: 10.7326/[id number]. PMID: 64403474; PMCID: [id number]. Randon Goldsmith, Hayward RA, Dunn KM, [place] KP. Risk of adverse events in patients prescribed long-term opioids: A cohort study in the [place] [facility] Research Datalink. Eur J Pain. 2019 May;23(5):908-922. doi: 10.1002/ejp.1357. Epub 2019 Jan 31. PMID: 43329518. Colameco S, Coren JS, Ciervo CA. Continuous opioid treatment for chronic noncancer pain: a time for moderation in prescribing. Postgrad Med. 2009 Jul;121(4):61-6. doi: 10.3810/pgm.2009.07.2032. PMID: 84166063. William Hamburger RN, [place] SD, Blazina I, Cristopher Peru, Bougatsos C, Deyo RA. The effectiveness and risks of long-term opioid therapy for chronic pain: a systematic review for a [employer] of [employer] Pathways to [employer]. Ann Intern Med. 2015 Feb 17;162(4):276-86. doi: 10.7326/M14-2559. PMID: 01601093. Caryl Bis [facility], Makuc DM. NCHS Data Brief No. 22. [place]: Centers for Disease Control and Prevention; 2009. Sep, Increase in Fatal Poisonings Involving Opioid Analgesics in the [place], 1999-2006. Song IA, Choi HR, Oh TK. Long-term opioid use and mortality in patients with chronic non-cancer pain: Ten-year follow-up study in [place] from 2010 through 2019. EClinicalMedicine. 2022 Jul 18;51:101558. doi: 10.1016/j.eclinm.2022.235573. PMID: 22025427; PMCID: [id number]. Huser, W., Schubert, T., Vogelmann, T. et al. All-cause mortality in patients with long-term opioid therapy compared with non-opioid analgesics for chronic non-cancer pain: a database study. BMC Med 18, 162 (2020). http://lester.info/ Rashidian H, Karie Kirks, Malekzadeh R, Haghdoost AA. An Ecological Study of the Association between Opiate Use and Incidence of Cancers. Addict Health. 2016 Fall;8(4):252-260. PMID: 51761607; PMCID: [id number].  Our Goal: Our goal is to control your  pain with means other than the use of opioid pain medications.  Our Recommendation: Talk to your physician about coming off of these medications. We can assist you with the tapering down and stopping these medicines. Based on the new information, even if you cannot completely stop the medication, a decrease in the dose may be associated with a lesser risk. Ask for other means of controlling the pain. Decrease or eliminate those factors that significantly contribute to your pain such as smoking, obesity, and a diet heavily tilted towards "inflammatory" nutrients.  Last Updated: 05/13/2023   ____________________________________________________________________________________________     ____________________________________________________________________________________________  National Pain Medication Shortage  The [place] is experiencing worsening drug shortages. These have had a negative widespread effect on patient care and treatment. Not expected to improve any time soon. Predicted to last past 2029.   Drug shortage list (generic names) Oxycodone IR Oxycodone/APAP Oxymorphone IR Hydromorphone Hydrocodone/APAP Morphine  Where is the problem?  Manufacturing and supply level.  Will this shortage affect you?  Only if you  take any of the above pain medications.  How? You may be unable to fill your prescription.  Your pharmacist may offer a "partial fill" of your prescription. (Warning: Do not accept partial fills.) Prescriptions partially filled cannot be transferred to another pharmacy. Read our Medication Rules and Regulation. Depending on how much medicine you are dependent on, you may experience withdrawals when unable to get the medication.  Recommendations: Consider ending your dependence on opioid pain medications. Ask your pain specialist to assist you with the process. Consider switching to a medication currently not in shortage, such as Buprenorphine. Talk to your pain  specialist about this option. Consider decreasing your pain medication requirements by managing tolerance thru "Drug Holidays". This may help minimize withdrawals, should you run out of medicine. Control your pain thru the use of non-pharmacological interventional therapies.   Your prescriber: Prescribers cannot be blamed for shortages. Medication manufacturing and supply issues cannot be fixed by the prescriber.   NOTE: The prescriber is not responsible for supplying the medication, or solving supply issues. Work with your pharmacist to solve it. The patient is responsible for the decision to take or continue taking the medication and for identifying and securing a legal supply source. By law, supplying the medication is the job and responsibility of the pharmacy. The prescriber is responsible for the evaluation, monitoring, and prescribing of these medications.   Prescribers will NOT: Re-issue prescriptions that have been partially filled. Re-issue prescriptions already sent to a pharmacy.  Re-send prescriptions to a different pharmacy because yours did not have your medication. Ask pharmacist to order more medicine or transfer the prescription to another pharmacy. (Read below.)  New 2023 regulation: "July 06, 2022 Revised Regulation Allows DEA-Registered Pharmacies to Transfer Electronic Prescriptions at a Patient's Request DEA Headquarters Division - Public Information Office Patients now have the ability to request their electronic prescription be transferred to another pharmacy without having to go back to their practitioner to initiate the request. This revised regulation went into effect on Monday, July 02, 2022.     At a patient's request, a DEA-registered retail pharmacy can now transfer an electronic prescription for a controlled substance (schedules II-V) to another DEA-registered retail pharmacy. Prior to this change, patients would have to go through their practitioner to  cancel their prescription and have it re-issued to a different pharmacy. The process was taxing and time consuming for both patients and practitioners.    The Drug Enforcement Administration [facility]) published its intent to revise the process for transferring electronic prescriptions on September 23, 2020.  The final rule was published in the federal register on May 31, 2022 and went into effect 30 days later.  Under the final rule, a prescription can only be transferred once between pharmacies, and only if allowed under existing state or other applicable law. The prescription must remain in its electronic form; may not be altered in any way; and the transfer must be communicated directly between two licensed pharmacists. It's important to note, any authorized refills transfer with the original prescription, which means the entire prescription will be filled at the same pharmacy".  Reference: [email or web address].is [facility] website announcement)  [email or web address] [employer] of Justice)   [employer] / Vol. 88, No. 143 / Thursday, May 31, 2022 / Rules and Regulations DEPARTMENT OF JUSTICE  Drug Enforcement Administration  21 CFR Part 1306  [Docket No. [id number]]  RIN [id number] Transfer of Electronic Prescriptions for Schedules II-V Controlled Substances Between Pharmacies for Initial Filling  ____________________________________________________________________________________________  ____________________________________________________________________________________________  Transfer of Pain Medication between Pharmacies  Re: 2023 DEA Clarification on existing regulation  Published on DEA Website: July 06, 2022  Title: Revised Regulation Allows DEA-Registered Pharmacies to Electrical engineer Prescriptions at a Patient's  Request DEA Headquarters Division - [employer]  "Patients now have the ability to request their electronic prescription be transferred to another pharmacy without having to go back to their practitioner to initiate the request. This revised regulation went into effect on Monday, July 02, 2022.     At a patient's request, a DEA-registered retail pharmacy can now transfer an electronic prescription for a controlled substance (schedules II-V) to another DEA-registered retail pharmacy. Prior to this change, patients would have to go through their practitioner to cancel their prescription and have it re-issued to a different pharmacy. The process was taxing and time consuming for both patients and practitioners.    The Drug Enforcement Administration [facility]) published its intent to revise the process for transferring electronic prescriptions on September 23, 2020.  The final rule was published in the federal register on May 31, 2022 and went into effect 30 days later.  Under the final rule, a prescription can only be transferred once between pharmacies, and only if allowed under existing state or other applicable law. The prescription must remain in its electronic form; may not be altered in any way; and the transfer must be communicated directly between two licensed pharmacists. It's important to note, any authorized refills transfer with the original prescription, which means the entire prescription will be filled at the same pharmacy."    REFERENCES: 1. DEA website announcement [email or web address]  2. Department of Justice website  [email or web address]  3. DEPARTMENT OF JUSTICE Drug Enforcement Administration 21 CFR Part 1306 [Docket No. [id number]] RIN [id number] "Transfer of Electronic Prescriptions for Schedules II-V Controlled Substances  Between Pharmacies for Initial Filling"  ____________________________________________________________________________________________     _______________________________________________________________________  Medication Rules  Purpose: To inform patients, and their family members, of our medication rules and regulations.  Applies to: All patients receiving prescriptions from our practice (written or electronic).  Pharmacy of record: This is the pharmacy where your electronic prescriptions will be sent. Make sure we have the correct one.  Electronic prescriptions: In compliance with the [facility] Strengthen Opioid Misuse Prevention (STOP) Act of 2017 (Session Conni Elliot [phone]), effective November 05, 2018, all controlled substances must be electronically prescribed. Written prescriptions, faxing, or calling prescriptions to a pharmacy will no longer be done.  Prescription refills: These will be provided only during in-person appointments. No medications will be renewed without a "face-to-face" evaluation with your provider. Applies to all prescriptions.  NOTE: The following applies primarily to controlled substances (Opioid* Pain Medications).   Type of encounter (visit): For patients receiving controlled substances, face-to-face visits are required. (Not an option and not up to the patient.)  Patient's responsibilities: Pain Pills: Bring all pain pills to every appointment (except for procedure appointments). Pill Bottles: Bring pills in original pharmacy bottle. Bring bottle, even if empty. Always bring the bottle of the most recent fill.  Medication refills: You are responsible for knowing and keeping track of what medications you are taking and when is it that you will need a refill. The day before your appointment: write a list of all prescriptions that need to be refilled. The day of the appointment: give the list to the admitting nurse. Prescriptions will be written only  during appointments. No prescriptions will be written on procedure days. If you forget a  medication: it will not be "Called in", "Faxed", or "electronically sent". You will need to get another appointment to get these prescribed. No early refills. Do not call asking to have your prescription filled early. Partial  or short prescriptions: Occasionally your pharmacy may not have enough pills to fill your prescription.  NEVER ACCEPT a partial fill or a prescription that is short of the total amount of pills that you were prescribed.  With controlled substances the law allows 72 hours for the pharmacy to complete the prescription.  If the prescription is not completed within 72 hours, the pharmacist will require a new prescription to be written. This means that you will be short on your medicine and we WILL NOT send another prescription to complete your original prescription.  Instead, request the pharmacy to send a carrier to a nearby branch to get enough medication to provide you with your full prescription. Prescription Accuracy: You are responsible for carefully inspecting your prescriptions before leaving our office. Have the discharge nurse carefully go over each prescription with you, before taking them home. Make sure that your name is accurately spelled, that your address is correct. Check the name and dose of your medication to make sure it is accurate. Check the number of pills, and the written instructions to make sure they are clear and accurate. Make sure that you are given enough medication to last until your next medication refill appointment. Taking Medication: Take medication as prescribed. When it comes to controlled substances, taking less pills or less frequently than prescribed is permitted and encouraged. Never take more pills than instructed. Never take the medication more frequently than prescribed.  Inform other Doctors: Always inform, all of your healthcare providers, of all the  medications you take. Pain Medication from other Providers: You are not allowed to accept any additional pain medication from any other Doctor or Healthcare provider. There are two exceptions to this rule. (see below) In the event that you require additional pain medication, you are responsible for notifying us, as stated below. Cough Medicine: Often these contain an opioid, such as codeine or hydrocodone. Never accept or take cough medicine containing these opioids if you are already taking an opioid* medication. The combination may cause respiratory failure and death. Medication Agreement: You are responsible for carefully reading and following our Medication Agreement. This must be signed before receiving any prescriptions from our practice. Safely store a copy of your signed Agreement. Violations to the Agreement will result in no further prescriptions. (Additional copies of our Medication Agreement are available upon request.) Laws, Rules, & Regulations: All patients are expected to follow all [address] and [employer], [employer], Rules, [place] [employer]. Ignorance of the Laws does not constitute a valid excuse.  Illegal drugs and Controlled Substances: The use of illegal substances (including, but not limited to marijuana and its derivatives) and/or the illegal use of any controlled substances is strictly prohibited. Violation of this rule may result in the immediate and permanent discontinuation of any and all prescriptions being written by our practice. The use of any illegal substances is prohibited. Adopted CDC guidelines & recommendations: Target dosing levels will be at or below 60 MME/day. Use of benzodiazepines** is not recommended.  Exceptions: There are only two exceptions to the rule of not receiving pain medications from other Healthcare Providers. Exception #1 (Emergencies): In the event of an emergency (i.e.: accident requiring emergency care), you are allowed to receive additional pain  medication. However, you are responsible for: As soon as  you are able, call our office [phone], at any time of the day or night, and leave a message stating your name, the date and nature of the emergency, and the name and dose of the medication prescribed. In the event that your call is answered by a member of our staff, make sure to document and save the date, time, and the name of the person that took your information.  Exception #2 (Planned Surgery): In the event that you are scheduled by another doctor or dentist to have any type of surgery or procedure, you are allowed (for a period no longer than 30 days), to receive additional pain medication, for the acute post-op pain. However, in this case, you are responsible for picking up a copy of our "Post-op Pain Management for Surgeons" handout, and giving it to your surgeon or dentist. This document is available at our office, and does not require an appointment to obtain it. Simply go to our office during business hours (Monday-Thursday from 8:00 AM to 4:00 PM) (Friday 8:00 AM to 12:00 Noon) or if you have a scheduled appointment with [place], prior to your surgery, and ask for it by name. In addition, you are responsible for: calling our office [phone], at any time of the day or night, and leaving a message stating your name, name of your surgeon, type of surgery, and date of procedure or surgery. Failure to comply with your responsibilities may result in termination of therapy involving the controlled substances. Medication Agreement Violation. Following the above rules, including your responsibilities will help you in avoiding a Medication Agreement Violation ("Breaking your Pain Medication Contract").  Consequences:  Not following the above rules may result in permanent discontinuation of medication prescription therapy.  *Opioid medications include: morphine, codeine, oxycodone, oxymorphone, hydrocodone, hydromorphone, meperidine, tramadol,  tapentadol, buprenorphine, fentanyl, methadone. **Benzodiazepine medications include: diazepam (Valium), alprazolam (Xanax), clonazepam (Klonopine), lorazepam (Ativan), clorazepate (Tranxene), chlordiazepoxide (Librium), estazolam (Prosom), oxazepam (Serax), temazepam (Restoril), triazolam (Halcion) (Last updated: 08/28/2022) ______________________________________________________________________    ______________________________________________________________________  Medication Recommendations and Reminders  Applies to: All patients receiving prescriptions (written and/or electronic).  Medication Rules & Regulations: You are responsible for reading, knowing, and following our "Medication Rules" document. These exist for your safety and that of others. They are not flexible and neither are we. Dismissing or ignoring them is an act of "non-compliance" that may result in complete and irreversible termination of such medication therapy. For safety reasons, "non-compliance" will not be tolerated. As with the [place] fundamental legal principle of "ignorance of the law is no defense", we will accept no excuses for not having read and knowing the content of documents provided to you by our practice.  Pharmacy of record:  Definition: This is the pharmacy where your electronic prescriptions will be sent.  We do not endorse any particular pharmacy. It is up to you and your insurance to decide what pharmacy to use.  We do not restrict you in your choice of pharmacy. However, once we write for your prescriptions, we will NOT be re-sending more prescriptions to fix restricted supply problems created by your pharmacy, or your insurance.  The pharmacy listed in the electronic medical record should be the one where you want electronic prescriptions to be sent. If you choose to change pharmacy, simply notify our nursing staff. Changes will be made only during your regular appointments and not over the  phone.  Recommendations: Keep all of your pain medications in a safe place, under lock and key, even  if you live alone. We will NOT replace lost, stolen, or damaged medication. We do not accept "Police Reports" as proof of medications having been stolen. After you fill your prescription, take 1 week's worth of pills and put them away in a safe place. You should keep a separate, properly labeled bottle for this purpose. The remainder should be kept in the original bottle. Use this as your primary supply, until it runs out. Once it's gone, then you know that you have 1 week's worth of medicine, and it is time to come in for a prescription refill. If you do this correctly, it is unlikely that you will ever run out of medicine. To make sure that the above recommendation works, it is very important that you make sure your medication refill appointments are scheduled at least 1 week before you run out of medicine. To do this in an effective manner, make sure that you do not leave the office without scheduling your next medication management appointment. Always ask the nursing staff to show you in your prescription , when your medication will be running out. Then arrange for the receptionist to get you a return appointment, at least 7 days before you run out of medicine. Do not wait until you have 1 or 2 pills left, to come in. This is very poor planning and does not take into consideration that we may need to cancel appointments due to bad weather, sickness, or emergencies affecting our staff. DO NOT ACCEPT A "Partial Fill": If for any reason your pharmacy does not have enough pills/tablets to completely fill or refill your prescription, do not allow for a "partial fill". The law allows the pharmacy to complete that prescription within 72 hours, without requiring a new prescription. If they do not fill the rest of your prescription within those 72 hours, you will need a separate prescription to fill the remaining  amount, which we will NOT provide. If the reason for the partial fill is your insurance, you will need to talk to the pharmacist about payment alternatives for the remaining tablets, but again, DO NOT ACCEPT A PARTIAL FILL, unless you can trust your pharmacist to obtain the remainder of the pills within 72 hours.  Prescription refills and/or changes in medication(s):  Prescription refills, and/or changes in dose or medication, will be conducted only during scheduled medication management appointments. (Applies to both, written and electronic prescriptions.) No refills on procedure days. No medication will be changed or started on procedure days. No changes, adjustments, and/or refills will be conducted on a procedure day. Doing so will interfere with the diagnostic portion of the procedure. No phone refills. No medications will be "called into the pharmacy". No Fax refills. No weekend refills. No Holliday refills. No after hours refills.  Remember:  Business hours are:  Monday to Thursday 8:00 AM to 4:00 PM Provider's Schedule: Delano Metz, MD - Appointments are:  Medication management: Monday and Wednesday 8:00 AM to 4:00 PM Procedure day: Tuesday and Thursday 7:30 AM to 4:00 PM Edward Jolly, MD - Appointments are:  Medication management: Tuesday and Thursday 8:00 AM to 4:00 PM Procedure day: Monday and Wednesday 7:30 AM to 4:00 PM (Last update: 08/28/2022) ______________________________________________________________________   ____________________________________________________________________________________________  Naloxone Nasal Spray  Why am I receiving this medication? [place] STOP ACT requires that all patients taking high dose opioids or at risk of opioids respiratory depression, be prescribed an opioid reversal agent, such as Naloxone (AKA: Narcan).  What is this medication? NALOXONE (  nal OX one) treats opioid overdose, which causes slow or shallow breathing,  severe drowsiness, or trouble staying awake. Call emergency services after using this medication. You may need additional treatment. Naloxone works by reversing the effects of opioids. It belongs to a group of medications called opioid blockers.  COMMON BRAND NAME(S): Kloxxado, Narcan  What should I tell my care team before I take this medication? They need to know if you have any of these conditions: Heart disease Substance use disorder An unusual or allergic reaction to naloxone, other medications, foods, dyes, or preservatives Pregnant or trying to get pregnant Breast-feeding  When to use this medication? This medication is to be used for the treatment of respiratory depression (less than 8 breaths per minute) secondary to opioid overdose.   How to use this medication? This medication is for use in the nose. Lay the person on their back. Support their neck with your hand and allow the head to tilt back before giving the medication. The nasal spray should be given into 1 nostril. After giving the medication, move the person onto their side. Do not remove or test the nasal spray until ready to use. Get emergency medical help right away after giving the first dose of this medication, even if the person wakes up. You should be familiar with how to recognize the signs and symptoms of a narcotic overdose. If more doses are needed, give the additional dose in the other nostril. Talk to your care team about the use of this medication in children. While this medication may be prescribed for children as young as newborns for selected conditions, precautions do apply.  Naloxone Overdosage: If you think you have taken too much of this medicine contact a poison control center or emergency room at once.  NOTE: This medicine is only for you. Do not share this medicine with others.  What if I miss a dose? This does not apply.  What may interact with this medication? This is only used during an  emergency. No interactions are expected during emergency use. This list may not describe all possible interactions. Give your health care provider a list of all the medicines, herbs, non-prescription drugs, or dietary supplements you use. Also tell them if you smoke, drink alcohol, or use illegal drugs. Some items may interact with your medicine.  What should I watch for while using this medication? Keep this medication ready for use in the case of an opioid overdose. Make sure that you have the phone number of your care team and local hospital ready. You may need to have additional doses of this medication. Each nasal spray contains a single dose. Some emergencies may require additional doses. After use, bring the treated person to the nearest hospital or call 911. Make sure the treating care team knows that the person has received a dose of this medication. You will receive additional instructions on what to do during and after use of this medication before an emergency occurs.  What side effects may I notice from receiving this medication? Side effects that you should report to your care team as soon as possible: Allergic reactions--skin rash, itching, hives, swelling of the face, lips, tongue, or throat Side effects that usually do not require medical attention (report these to your care team if they continue or are bothersome): Constipation Dryness or irritation inside the nose Headache Increase in blood pressure Muscle spasms Stuffy nose Toothache This list may not describe all possible side effects. Call your doctor for  medical advice about side effects. You may report side effects to FDA at 1-800-FDA-1088.  Where should I keep my medication? Because this is an emergency medication, you should keep it with you at all times.  Keep out of the reach of children and pets. Store between 20 and 25 degrees C (68 and 77 degrees F). Do not freeze. Throw away any unused medication after the  expiration date. Keep in original box until ready to use.  NOTE: This sheet is a summary. It may not cover all possible information. If you have questions about this medicine, talk to your doctor, pharmacist, or health care provider.   2023 Elsevier/Gold Standard (2021-06-30 00:00:00)  ____________________________________________________________________________________________

## 2023-07-10 ENCOUNTER — Ambulatory Visit: Payer: MEDICAID | Attending: Pain Medicine | Admitting: Pain Medicine

## 2023-07-10 ENCOUNTER — Encounter: Payer: Self-pay | Admitting: Pain Medicine

## 2023-07-10 VITALS — BP 122/51 | Temp 98.1°F | Ht 61.0 in | Wt 230.0 lb

## 2023-07-10 DIAGNOSIS — M79604 Pain in right leg: Secondary | ICD-10-CM | POA: Diagnosis present

## 2023-07-10 DIAGNOSIS — M51379 Other intervertebral disc degeneration, lumbosacral region without mention of lumbar back pain or lower extremity pain: Secondary | ICD-10-CM

## 2023-07-10 DIAGNOSIS — M25551 Pain in right hip: Secondary | ICD-10-CM | POA: Diagnosis present

## 2023-07-10 DIAGNOSIS — M79605 Pain in left leg: Secondary | ICD-10-CM | POA: Insufficient documentation

## 2023-07-10 DIAGNOSIS — M25552 Pain in left hip: Secondary | ICD-10-CM | POA: Insufficient documentation

## 2023-07-10 DIAGNOSIS — Z79891 Long term (current) use of opiate analgesic: Secondary | ICD-10-CM | POA: Diagnosis present

## 2023-07-10 DIAGNOSIS — M25562 Pain in left knee: Secondary | ICD-10-CM | POA: Insufficient documentation

## 2023-07-10 DIAGNOSIS — M25561 Pain in right knee: Secondary | ICD-10-CM | POA: Diagnosis present

## 2023-07-10 DIAGNOSIS — M545 Low back pain, unspecified: Secondary | ICD-10-CM

## 2023-07-10 DIAGNOSIS — M47816 Spondylosis without myelopathy or radiculopathy, lumbar region: Secondary | ICD-10-CM | POA: Diagnosis present

## 2023-07-10 DIAGNOSIS — Z79899 Other long term (current) drug therapy: Secondary | ICD-10-CM

## 2023-07-10 DIAGNOSIS — G35 Multiple sclerosis: Secondary | ICD-10-CM | POA: Diagnosis present

## 2023-07-10 DIAGNOSIS — G8929 Other chronic pain: Secondary | ICD-10-CM

## 2023-07-10 DIAGNOSIS — M5137 Other intervertebral disc degeneration, lumbosacral region: Secondary | ICD-10-CM | POA: Insufficient documentation

## 2023-07-10 DIAGNOSIS — M5459 Other low back pain: Secondary | ICD-10-CM

## 2023-07-10 DIAGNOSIS — M533 Sacrococcygeal disorders, not elsewhere classified: Secondary | ICD-10-CM | POA: Insufficient documentation

## 2023-07-10 DIAGNOSIS — G894 Chronic pain syndrome: Secondary | ICD-10-CM

## 2023-07-10 MED ORDER — TRAMADOL HCL 50 MG PO TABS: 50.0000 mg | ORAL_TABLET | Freq: Four times a day (QID) | ORAL | 2 refills | Status: DC | PRN

## 2023-07-10 MED ORDER — OXYCODONE HCL 5 MG PO TABS: 5.0000 mg | ORAL_TABLET | Freq: Four times a day (QID) | ORAL | 0 refills | Status: DC | PRN

## 2023-07-10 MED ORDER — OXYCODONE HCL 5 MG PO TABS
5.0000 mg | ORAL_TABLET | Freq: Four times a day (QID) | ORAL | 0 refills | Status: DC | PRN
Start: 2023-07-12 — End: 2023-07-10

## 2023-07-10 MED ORDER — OXYCODONE HCL 5 MG PO TABS
5.0000 mg | ORAL_TABLET | Freq: Four times a day (QID) | ORAL | 0 refills | Status: DC | PRN
Start: 2023-08-11 — End: 2023-07-10

## 2023-07-10 NOTE — Progress Notes (Signed)
Nursing Pain Medication Assessment:  Safety precautions to be maintained throughout the outpatient stay will include: orient to surroundings, keep bed in low position, maintain call bell within reach at all times, provide assistance with transfer out of bed and ambulation.  Medication Inspection Compliance: Pill count conducted under aseptic conditions, in front of the patient. Neither the pills nor the bottle was removed from the patient's sight at any time. Once count was completed pills were immediately returned to the patient in their original bottle.  Medication: Oxycodone IR Pill/Patch Count:  15 of 120 pills remain Pill/Patch Appearance: Markings consistent with prescribed medication Bottle Appearance: Standard pharmacy container. Clearly labeled. Filled Date: 8 / 7 / 2024 Last Medication intake:  TodaySafety precautions to be maintained throughout the outpatient stay will include: orient to surroundings, keep bed in low position, maintain call bell within reach at all times, provide assistance with transfer out of bed and ambulation.

## 2023-07-10 NOTE — Progress Notes (Signed)
Call to patient's pharmacy, [facility], and spoke with pharmacist [facility]. Per pharmacist she cancelled prescriptions on Oxycodone 5mg .   Earlyne Iba, RN

## 2023-07-24 ENCOUNTER — Telehealth: Payer: Self-pay | Admitting: Pain Medicine

## 2023-07-24 NOTE — Telephone Encounter (Signed)
Dr Laban Emperor, spoke with patient and she states she took 4 of the Tramadol and it made her sick so she threw the rest away.  Instructed her to never throw her meds away.  Informed her that you would not make any medication changes over the phone.  She states she threw them away the first week she had them which was 11 days ago.  She currently has 2 refills left on that prescription.  She would like to go back on the oxycodone. How would you like to handle this?  Thank you

## 2023-07-24 NOTE — Telephone Encounter (Signed)
Patient first asked when her next appt is, which is Dec 2 then she states she cannot take the tramadol. It gave her headaches, nausea, and she threw them in the trash. Wants to know what to do next

## 2023-07-25 ENCOUNTER — Other Ambulatory Visit: Payer: Self-pay | Admitting: Pain Medicine

## 2023-07-25 ENCOUNTER — Telehealth: Payer: Self-pay | Admitting: Pain Medicine

## 2023-07-25 DIAGNOSIS — M545 Low back pain, unspecified: Secondary | ICD-10-CM

## 2023-07-25 NOTE — Telephone Encounter (Signed)
Patient notified

## 2023-07-25 NOTE — Telephone Encounter (Signed)
Patient wants to go for physical therapy for her back

## 2023-08-19 ENCOUNTER — Telehealth: Payer: Self-pay | Admitting: Pain Medicine

## 2023-08-19 NOTE — Telephone Encounter (Signed)
Patient states that FN switched from oxycodone to Tramadol.  Patient reports that she is unable to tolerate Tramadol.  She received a short fill of 12 tablets, she took 1 and threw the rest away.  States when she went back to pharmacy, they had the rest of her tablets, qty 120, which was the whole Rx without the short fill being decucted.  She is asking if she can bring Tramadol back to be wasted and change back over to the oxycodone. I have told her that FN will probably not want to switch her back.  Reports that she was unable to tolerate any solids x 2 weeks.

## 2023-08-19 NOTE — Telephone Encounter (Signed)
Please call patient regarding her Tramadol

## 2023-08-20 NOTE — Progress Notes (Unsigned)
PROVIDER NOTE: Information contained herein reflects review and annotations entered in association with encounter. Interpretation of such information and data should be left to medically-trained personnel. Information provided to patient can be located elsewhere in the medical record under "Patient Instructions". Document created using STT-dictation technology, any transcriptional errors that may result from process are unintentional.    Patient: Shannon Benitez  Service Category: E/M  Provider: Oswaldo Done, MD  DOB: 12-30-1970  DOS: 08/21/2023  Referring Provider: Jenell Milliner, MD  MRN: [id number]  Specialty: Interventional Pain Management  PCP: Jenell Milliner, MD  Type: Established Patient  Setting: Ambulatory outpatient    Location: Office  Delivery: Face-to-face     HPI  Shannon Benitez, a 52 y.o. year old female, is here today because of her Chronic pain syndrome [G89.4]. Shannon Benitez primary complain today is No chief complaint on file.  Pertinent problems: Shannon Benitez has Multiple sclerosis (HCC); Neurogenic pain; Chronic sacroiliac joint pain (Bilateral) (L>R); Chronic pain syndrome; Chronic low back pain (1ry area of Pain) (Bilateral) (L>R) w/ sciatica (Bilateral); Chronic lower extremity pain (2ry area of Pain) (Bilateral) (R>L); Chronic low back pain (Bilateral) (L>R) w/o sciatica; Muscle spasticity; Spondylosis without myelopathy or radiculopathy, lumbosacral region; Lumbar facet syndrome (Bilateral) (R>L); Abnormal MRI, lumbar spine (06/16/2018); Lumbar facet hypertrophy (Multilevel) (Bilateral); Osteoarthritis of facet joint of lumbar spine; Osteoarthritis of lumbar spine; DDD (degenerative disc disease), lumbosacral; Lumbar spondylosis; Diabetic peripheral neuropathy (HCC); 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); Abnormal MRI, cervical spine (12/27/20); Scapulalgia (Left); Traumatic injury of scapula (Left); Other spondylosis, sacral and  sacrococcygeal region; Acute exacerbation of chronic low back pain; Chronic knee pain (Bilateral); Osteoarthritis of knee (Right); Osteochondroma of proximal fibula (Right); Osteochondroma of proximal tibia (Right); Osteoarthritis involving multiple joints; Abnormal MRI, thoracic spine (12/27/2020); Chronic hip pain (Bilateral); Osteoarthritis of hips (Bilateral); Fibromyalgia; Intractable low back pain; Myofascial pain syndrome of lumbar spine; Demyelinating disease of central nervous system, unspecified (HCC); and Lumbar facet joint pain on their pertinent problem list. Pain Assessment: Severity of   is reported as a  /10. Location:    / . Onset:  . Quality:  . Timing:  . Modifying factor(s):  Marland Kitchen Vitals:  vitals were not taken for this visit.  BMI: Estimated body mass index is 43.46 kg/m as calculated from the following:   Height as of 07/10/23: 5\' 1"  (1.549 m).   Weight as of 07/10/23: 230 lb (104.3 kg). Last encounter: 07/10/2023. Last procedure: 10/25/2022.  Reason for encounter: medication management. ***  Pharmacotherapy Assessment  Analgesic: Tramadol 50 mg 1 tab p.o. 4 times daily MME/day: 20 mg/day.   Monitoring: [place] PMP: PDMP reviewed during this encounter.       Pharmacotherapy: No side-effects or adverse reactions reported. Compliance: No problems identified. Effectiveness: Clinically acceptable.  No notes on file  No results found for: "CBDTHCR" No results found for: "D8THCCBX" No results found for: "D9THCCBX"  UDS:  Summary  Date Value Ref Range Status  12/26/2022 Note  Corrected    Comment:    ==================================================================== [employer] Select 13 (MW) ==================================================================== Test                             Result       Flag       Units  Drug Present and Declared for Prescription Verification   Oxycodone  2390         EXPECTED   ng/mg creat   Noroxycodone                    5988         EXPECTED   ng/mg creat   Noroxymorphone                 61           EXPECTED   ng/mg creat    Sources of oxycodone include scheduled prescription medications.    Noroxycodone and noroxymorphone are expected metabolites of    oxycodone.    Phenobarbital                  PRESENT      EXPECTED    Phenobarbital is an expected metabolite of primidone; Phenobarbital    may also be administered as a prescription drug.  ==================================================================== Test                      Result    Flag   Units      Ref Range   Creatinine              163              mg/dL      >=53 ==================================================================== Declared Medications:  The flagging and interpretation on this report are based on the  following declared medications.  Unexpected results may arise from  inaccuracies in the declared medications.   **Note: The testing scope of this panel includes these medications:   Oxycodone (OxyIR)  Primidone (Mysoline)   **Note: The testing scope of this panel does not include the  following reported medications:   Omeprazole (Prilosec)  Pregabalin (Lyrica)  Propranolol (Inderal)  Sertraline (Zoloft)  Sitagliptin (Januvia) ==================================================================== For clinical consultation, please call [phone]. ====================================================================       ROS  Constitutional: Denies any fever or chills Gastrointestinal: No reported hemesis, hematochezia, vomiting, or acute GI distress Musculoskeletal: Denies any acute onset joint swelling, redness, loss of ROM, or weakness Neurological: No reported episodes of acute onset apraxia, aphasia, dysarthria, agnosia, amnesia, paralysis, loss of coordination, or loss of consciousness  Medication Review  Dimethyl Fumarate, aspirin-acetaminophen-caffeine, baclofen, lamoTRIgine, levETIRAcetam,  loperamide, meloxicam, metFORMIN, mirabegron ER, naloxone, omeprazole, pregabalin, primidone, propranolol, sitaGLIPtin, and traMADol  History Review  Allergy: Shannon Benitez is allergic to 5-alpha reductase inhibitors, desvenlafaxine, glatiramer, rebif [interferon beta-1a], gramineae pollens, and latex. Drug: Shannon Benitez  has no history on file for drug use. Alcohol:  reports no history of alcohol use. Tobacco:  reports that she quit smoking about 6 years ago. Her smoking use included cigarettes. She started smoking about 24 years ago. She has a 9 pack-year smoking history. She has quit using smokeless tobacco. Social: Shannon Benitez  reports that she quit smoking about 6 years ago. Her smoking use included cigarettes. She started smoking about 24 years ago. She has a 9 pack-year smoking history. She has quit using smokeless tobacco. She reports that she does not drink alcohol. Medical:  has a past medical history of Acute postoperative pain (04/30/2019), Diabetes mellitus without complication (HCC), GERD (gastroesophageal reflux disease), Headache, MS (multiple sclerosis) (HCC), Numbness and tingling of both legs below knees, Osteoarthritis, Vertigo, and Wears dentures. Surgical: Shannon Benitez  has a past surgical history that includes Abdominal hysterectomy; Cesarean section; Tumor removal (Left); Cataract extraction w/PHACO (Right, 03/18/2019); and Cataract extraction w/PHACO (Left, 04/13/2019).  Family: family history is not on file. She was adopted.  Laboratory Chemistry Profile   Renal Lab Results  Component Value Date   BUN 19 10/01/2022   CREATININE 0.59 10/01/2022   BCR 13 11/27/2018   GFRAA >60 09/25/2019   GFRNONAA >60 10/01/2022    Hepatic Lab Results  Component Value Date   AST 18 10/01/2022   ALT 14 10/01/2022   ALBUMIN 4.0 10/01/2022   ALKPHOS 91 10/01/2022    Electrolytes Lab Results  Component Value Date   NA 138 10/01/2022   K 3.4 (L) 10/01/2022   CL 103  10/01/2022   CALCIUM 9.7 10/01/2022   MG 2.2 11/27/2018    Bone Lab Results  Component Value Date   25OHVITD1 55 11/27/2018   25OHVITD2 <1.0 11/27/2018   25OHVITD3 55 11/27/2018    Inflammation (CRP: Acute Phase) (ESR: Chronic Phase) Lab Results  Component Value Date   CRP 10 11/27/2018   ESRSEDRATE 42 (H) 11/27/2018         Note: Above Lab results reviewed.  Recent Imaging Review  MR LUMBAR SPINE WO CONTRAST CLINICAL DATA:  Low back pain for over 6 weeks  EXAM: MRI LUMBAR SPINE WITHOUT CONTRAST  TECHNIQUE: Multiplanar, multisequence MR imaging of the lumbar spine was performed. No intravenous contrast was administered.  COMPARISON:  09/22/2020  FINDINGS: Segmentation:  Standard.  Alignment:  2 mm retrolisthesis of L5 on S1.  Vertebrae: No acute fracture, evidence of discitis, or aggressive bone lesion.  Conus medullaris and cauda equina: Conus extends to the L2 level. Conus and cauda equina appear normal.  Paraspinal and other soft tissues: No acute paraspinal abnormality.  Disc levels:  Disc spaces: Disc desiccation at L3-4, L4-5 and L5-S1.  T12-L1: No significant disc bulge. No neural foraminal stenosis. No central canal stenosis.  L1-L2: No significant disc bulge. Mild bilateral facet arthropathy. No foraminal or central canal stenosis.  L2-L3: No significant disc bulge. Mild bilateral facet arthropathy. No foraminal or central canal stenosis.  L3-L4: No significant disc bulge. Mild bilateral facet arthropathy. No foraminal or central canal stenosis.  L4-L5: Mild broad-based disc bulge with a small shallow central disc protrusion. Mild bilateral facet arthropathy. No foraminal or central canal stenosis.  L5-S1: Mild broad-based disc bulge. Mild bilateral facet arthropathy. No foraminal or central canal stenosis.  IMPRESSION: 1. Mild lumbar spine spondylosis as described above without significant interval change compared with 09/22/2020. 2.  No acute osseous injury of the lumbar spine.  Electronically Signed   By: Elige Ko M.D.   On: 04/27/2023 07:23 Note: Reviewed        Physical Exam  General appearance: Well nourished, well developed, and well hydrated. In no apparent acute distress Mental status: Alert, oriented x 3 (person, place, & time)       Respiratory: No evidence of acute respiratory distress Eyes: PERLA Vitals: There were no vitals taken for this visit. BMI: Estimated body mass index is 43.46 kg/m as calculated from the following:   Height as of 07/10/23: 5\' 1"  (1.549 m).   Weight as of 07/10/23: 230 lb (104.3 kg). Ideal: Patient weight not recorded  Assessment   Diagnosis Status  1. Chronic pain syndrome   2. Medication management   3. Chronic use of opiate for therapeutic purpose   4. Physical tolerance to opiate drug   5. Long term prescription benzodiazepine use    Controlled Controlled Controlled   Updated Problems: No problems updated.  Plan of Care  Problem-specific:  No  problem-specific Assessment & Plan notes found for this encounter.  Shannon Benitez has a current medication list which includes the following long-term medication(s): baclofen, baclofen, lamotrigine, levetiracetam, meloxicam, metformin, naloxone, omeprazole, pregabalin, primidone, propranolol, sitagliptin, and tramadol.  Pharmacotherapy (Medications Ordered): No orders of the defined types were placed in this encounter.  Orders:  No orders of the defined types were placed in this encounter.  Follow-up plan:   No follow-ups on file.      Interventional Therapies  Risk Factors  Considerations:   Allergy: LATEX NOTE: NO RFA until BMI is <30  Continue encouraging the patient to bring her BMI below 35.   Planned  Pending:   Therapeutic bilateral lumbar facet MBB #7 (#3 of 2023) (10/04/2022) NO-SHOW to pain management appointment for procedure: Bilateral lumbar facet block.  Did not call to cancel.   Under  consideration:   Diagnostic bilateral T11-12 lumbar facet block   Diagnostic right T7-8 TESI   Diagnostic right T7 TFESI   Diagnostic ML CESI   Possible candidate for intrathecal pump trial and implant    Completed:   Palliative bilateral lumbar facet MBB x7 (10/25/2022) (100/95/95) Therapeutic left lumbar facet RFA x3 (05/24/2020)  Therapeutic right lumbar facet RFA x3 (04/07/2020)  (08/10/2020) referral to bariatric surgery and medical weight management    Completed by other providers:   Therapeutic/diagnostic bilateral SI Blk x1 (09/05/2021) by Sarajane Marek, MD (PMR)  Diagnostic/therapeutic bilateral L4/5 TFESI x1 (09/12/2017) by Karma Greaser, MD [facility])  Diagnostic/therapeutic bilateral SI Blk x1 (01/30/2017) by Karma Greaser, MD [facility])    Therapeutic  Palliative (PRN) options:   Palliative bilateral lumbar facet MBB   Palliative left lumbar facet RFA  Palliative right lumbar facet RFA     Pharmacotherapy  Nonopioids transfer 08/10/2020: Mobic, baclofen, and Lyrica.         Recent Visits Date Type Provider Dept  07/10/23 Office Visit Delano Metz, MD [facility]  Showing recent visits within past 90 days and meeting all other requirements Future Appointments Date Type Provider Dept  08/21/23 Appointment Delano Metz, MD [facility]  10/07/23 Appointment Delano Metz, MD [facility]  Showing future appointments within next 90 days and meeting all other requirements  I discussed the assessment and treatment plan with the patient. The patient was provided an opportunity to ask questions and all were answered. The patient agreed with the plan and demonstrated an understanding of the instructions.  Patient advised to call back or seek an in-person evaluation if the symptoms or condition worsens.  Duration of encounter: *** minutes.  Total time on encounter, as per AMA guidelines included both the face-to-face  and non-face-to-face time personally spent by the physician and/or other qualified health care professional(s) on the day of the encounter (includes time in activities that require the physician or other qualified health care professional and does not include time in activities normally performed by clinical staff). Physician's time may include the following activities when performed: Preparing to see the patient (e.g., pre-charting review of records, searching for previously ordered imaging, lab work, and nerve conduction tests) Review of prior analgesic pharmacotherapies. Reviewing PMP Interpreting ordered tests (e.g., lab work, imaging, nerve conduction tests) Performing post-procedure evaluations, including interpretation of diagnostic procedures Obtaining and/or reviewing separately obtained history Performing a medically appropriate examination and/or evaluation Counseling and educating the patient/family/caregiver Ordering medications, tests, or procedures Referring and communicating with other health care professionals (when not separately reported) Documenting clinical  information in the electronic or other health record Independently interpreting results (not separately reported) and communicating results to the patient/ family/caregiver Care coordination (not separately reported)  Note by: Oswaldo Done, MD Date: 08/21/2023; Time: 11:20 AM

## 2023-08-21 ENCOUNTER — Ambulatory Visit: Payer: MEDICAID | Attending: Pain Medicine | Admitting: Pain Medicine

## 2023-08-21 ENCOUNTER — Encounter: Payer: Self-pay | Admitting: Pain Medicine

## 2023-08-21 VITALS — BP 111/66 | HR 73 | Temp 97.3°F | Ht 61.0 in | Wt 230.0 lb

## 2023-08-21 DIAGNOSIS — M25551 Pain in right hip: Secondary | ICD-10-CM | POA: Diagnosis present

## 2023-08-21 DIAGNOSIS — M79604 Pain in right leg: Secondary | ICD-10-CM | POA: Diagnosis present

## 2023-08-21 DIAGNOSIS — M5442 Lumbago with sciatica, left side: Secondary | ICD-10-CM | POA: Insufficient documentation

## 2023-08-21 DIAGNOSIS — M5441 Lumbago with sciatica, right side: Secondary | ICD-10-CM | POA: Insufficient documentation

## 2023-08-21 DIAGNOSIS — M533 Sacrococcygeal disorders, not elsewhere classified: Secondary | ICD-10-CM | POA: Diagnosis present

## 2023-08-21 DIAGNOSIS — G35 Multiple sclerosis: Secondary | ICD-10-CM | POA: Diagnosis present

## 2023-08-21 DIAGNOSIS — G894 Chronic pain syndrome: Secondary | ICD-10-CM | POA: Insufficient documentation

## 2023-08-21 DIAGNOSIS — M25562 Pain in left knee: Secondary | ICD-10-CM | POA: Insufficient documentation

## 2023-08-21 DIAGNOSIS — R937 Abnormal findings on diagnostic imaging of other parts of musculoskeletal system: Secondary | ICD-10-CM | POA: Insufficient documentation

## 2023-08-21 DIAGNOSIS — E1142 Type 2 diabetes mellitus with diabetic polyneuropathy: Secondary | ICD-10-CM | POA: Diagnosis present

## 2023-08-21 DIAGNOSIS — G8929 Other chronic pain: Secondary | ICD-10-CM | POA: Diagnosis present

## 2023-08-21 DIAGNOSIS — M5459 Other low back pain: Secondary | ICD-10-CM | POA: Diagnosis present

## 2023-08-21 DIAGNOSIS — F1190 Opioid use, unspecified, uncomplicated: Secondary | ICD-10-CM | POA: Insufficient documentation

## 2023-08-21 DIAGNOSIS — M5137 Other intervertebral disc degeneration, lumbosacral region with discogenic back pain only: Secondary | ICD-10-CM | POA: Diagnosis present

## 2023-08-21 DIAGNOSIS — M47816 Spondylosis without myelopathy or radiculopathy, lumbar region: Secondary | ICD-10-CM | POA: Insufficient documentation

## 2023-08-21 DIAGNOSIS — Z79899 Other long term (current) drug therapy: Secondary | ICD-10-CM | POA: Insufficient documentation

## 2023-08-21 DIAGNOSIS — M25552 Pain in left hip: Secondary | ICD-10-CM | POA: Diagnosis present

## 2023-08-21 DIAGNOSIS — M79605 Pain in left leg: Secondary | ICD-10-CM | POA: Insufficient documentation

## 2023-08-21 DIAGNOSIS — Z79891 Long term (current) use of opiate analgesic: Secondary | ICD-10-CM | POA: Insufficient documentation

## 2023-08-21 DIAGNOSIS — M25561 Pain in right knee: Secondary | ICD-10-CM | POA: Insufficient documentation

## 2023-08-21 MED ORDER — OXYCODONE HCL 5 MG PO TABS: 5.0000 mg | ORAL_TABLET | Freq: Four times a day (QID) | ORAL | 0 refills | Status: DC | PRN

## 2023-08-21 MED ORDER — OXYCODONE HCL 5 MG PO TABS
5.0000 mg | ORAL_TABLET | Freq: Four times a day (QID) | ORAL | 0 refills | Status: DC | PRN
Start: 2023-10-20 — End: 2023-11-19

## 2023-08-21 MED ORDER — OXYCODONE HCL 5 MG PO TABS
5.0000 mg | ORAL_TABLET | Freq: Four times a day (QID) | ORAL | 0 refills | Status: DC | PRN
Start: 2023-09-20 — End: 2023-11-17

## 2023-08-21 NOTE — Patient Instructions (Signed)
______________________________________________________________________    Opioid Pain Medication Update  To: All patients taking opioid pain medications. (I.e.: hydrocodone, hydromorphone, oxycodone, oxymorphone, morphine, codeine, methadone, tapentadol, tramadol, buprenorphine, fentanyl, etc.)  Re: Updated review of side effects and adverse reactions of opioid analgesics, as well as new information about long term effects of this class of medications.  Direct risks of long-term opioid therapy are not limited to opioid addiction and overdose. Potential medical risks include serious fractures, breathing problems during sleep, hyperalgesia, immunosuppression, chronic constipation, bowel obstruction, myocardial infarction, and tooth decay secondary to xerostomia.  Unpredictable adverse effects that can occur even if you take your medication correctly: Cognitive impairment, respiratory depression, and death. Most people think that if they take their medication "correctly", and "as instructed", that they will be safe. Nothing could be farther from the truth. In reality, a significant amount of recorded deaths associated with the use of opioids has occurred in individuals that had taken the medication for a long time, and were taking their medication correctly. The following are examples of how this can happen: Patient taking his/her medication for a long time, as instructed, without any side effects, is given a certain antibiotic or another unrelated medication, which in turn triggers a "Drug-to-drug interaction" leading to disorientation, cognitive impairment, impaired reflexes, respiratory depression or an untoward event leading to serious bodily harm or injury, including death.  Patient taking his/her medication for a long time, as instructed, without any side effects, develops an acute impairment of liver and/or kidney function. This will lead to a rapid inability of the body to breakdown and eliminate  their pain medication, which will result in effects similar to an "overdose", but with the same medicine and dose that they had always taken. This again may lead to disorientation, cognitive impairment, impaired reflexes, respiratory depression or an untoward event leading to serious bodily harm or injury, including death.  A similar problem will occur with patients as they grow older and their liver and kidney function begins to decrease as part of the aging process.  Background information: Historically, the original case for using long-term opioid therapy to treat chronic noncancer pain was based on safety assumptions that subsequent experience has called into question. In 1996, the American Pain Society and the American Academy of Pain Medicine issued a consensus statement supporting long-term opioid therapy. This statement acknowledged the dangers of opioid prescribing but concluded that the risk for addiction was low; respiratory depression induced by opioids was short-lived, occurred mainly in opioid-naive patients, and was antagonized by pain; tolerance was not a common problem; and efforts to control diversion should not constrain opioid prescribing. This has now proven to be wrong. Experience regarding the risks for opioid addiction, misuse, and overdose in community practice has failed to support these assumptions.  According to the Centers for Disease Control and Prevention, fatal overdoses involving opioid analgesics have increased sharply over the past decade. Currently, more than 96,700 people die from drug overdoses every year. Opioids are a factor in 7 out of every 10 overdose deaths. Deaths from drug overdose have surpassed motor vehicle accidents as the leading cause of death for individuals between the ages of 66 and 61.  Clinical data suggest that neuroendocrine dysfunction may be very common in both men and women, potentially causing hypogonadism, erectile dysfunction, infertility,  decreased libido, osteoporosis, and depression. Recent studies linked higher opioid dose to increased opioid-related mortality. Controlled observational studies reported that long-term opioid therapy may be associated with increased risk for cardiovascular events. Subsequent  meta-analysis concluded that the safety of long-term opioid therapy in elderly patients has not been proven.   Side Effects and adverse reactions: Common side effects: Drowsiness (sedation). Dizziness. Nausea and vomiting. Constipation. Physical dependence -- Dependence often manifests with withdrawal symptoms when opioids are discontinued or decreased. Tolerance -- As you take repeated doses of opioids, you require increased medication to experience the same effect of pain relief. Respiratory depression -- This can occur in healthy people, especially with higher doses. However, people with COPD, asthma or other lung conditions may be even more susceptible to fatal respiratory impairment.  Uncommon side effects: An increased sensitivity to feeling pain and extreme response to pain (hyperalgesia). Chronic use of opioids can lead to this. Delayed gastric emptying (the process by which the contents of your stomach are moved into your small intestine). Muscle rigidity. Immune system and hormonal dysfunction. Quick, involuntary muscle jerks (myoclonus). Arrhythmia. Itchy skin (pruritus). Dry mouth (xerostomia).  Long-term side effects: Chronic constipation. Sleep-disordered breathing (SDB). Increased risk of bone fractures. Hypothalamic-pituitary-adrenal dysregulation. Increased risk of overdose.  RISKS: Respiratory depression and death: Opioids increase the risk of respiratory depression and death.  Drug-to-drug interactions: Opioids are relatively contraindicated in combination with benzodiazepines, sleep inducers, and other central nervous system depressants. Other classes of medications (i.e.: certain antibiotics  and even over-the-counter medications) may also trigger or induce respiratory depression in some patients.  Medical conditions: Patients with pre-existing respiratory problems are at higher risk of respiratory failure and/or depression when in combination with opioid analgesics. Opioids are relatively contraindicated in some medical conditions such as central sleep apnea.   Fractures and Falls:  Opioids increase the risk and incidence of falls. This is of particular importance in elderly patients.  Endocrine System:  Long-term administration is associated with endocrine abnormalities (endocrinopathies). (Also known as Opioid-induced Endocrinopathy) Influences on both the hypothalamic-pituitary-adrenal axis?and the hypothalamic-pituitary-gonadal axis have been demonstrated with consequent hypogonadism and adrenal insufficiency in both sexes. Hypogonadism and decreased levels of dehydroepiandrosterone sulfate have been reported in men and women. Endocrine effects include: Amenorrhoea in women (abnormal absence of menstruation) Reduced libido in both sexes Decreased sexual function Erectile dysfunction in men Hypogonadisms (decreased testicular function with shrinkage of testicles) Infertility Depression and fatigue Loss of muscle mass Anxiety Depression Immune suppression Hyperalgesia Weight gain Anemia Osteoporosis Patients (particularly women of childbearing age) should avoid opioids. There is insufficient evidence to recommend routine monitoring of asymptomatic patients taking opioids in the long-term for hormonal deficiencies.  Immune System: Human studies have demonstrated that opioids have an immunomodulating effect. These effects are mediated via opioid receptors both on immune effector cells and in the central nervous system. Opioids have been demonstrated to have adverse effects on antimicrobial response and anti-tumour surveillance. Buprenorphine has been demonstrated to have  no impact on immune function.  Opioid Induced Hyperalgesia: Human studies have demonstrated that prolonged use of opioids can lead to a state of abnormal pain sensitivity, sometimes called opioid induced hyperalgesia (OIH). Opioid induced hyperalgesia is not usually seen in the absence of tolerance to opioid analgesia. Clinically, hyperalgesia may be diagnosed if the patient on long-term opioid therapy presents with increased pain. This might be qualitatively and anatomically distinct from pain related to disease progression or to breakthrough pain resulting from development of opioid tolerance. Pain associated with hyperalgesia tends to be more diffuse than the pre-existing pain and less defined in quality. Management of opioid induced hyperalgesia requires opioid dose reduction.  Cancer: Chronic opioid therapy has been associated with an increased risk  of cancer among noncancer patients with chronic pain. This association was more evident in chronic strong opioid users. Chronic opioid consumption causes significant pathological changes in the small intestine and colon. Epidemiological studies have found that there is a link between opium dependence and initiation of gastrointestinal cancers. Cancer is the second leading cause of death after cardiovascular disease. Chronic use of opioids can cause multiple conditions such as GERD, immunosuppression and renal damage as well as carcinogenic effects, which are associated with the incidence of cancers.   Mortality: Long-term opioid use has been associated with increased mortality among patients with chronic non-cancer pain (CNCP).  Prescription of long-acting opioids for chronic noncancer pain was associated with a significantly increased risk of all-cause mortality, including deaths from causes other than overdose.  Reference: Von Korff M, Kolodny A, Deyo RA, Chou R. Long-term opioid therapy reconsidered. Ann Intern Med. 2011 Sep 6;155(5):325-8. doi:  10.7326/[id number]. PMID: 16109604; PMCID: [id number]. Randon Goldsmith, Hayward RA, Dunn KM, [place] KP. Risk of adverse events in patients prescribed long-term opioids: A cohort study in the [place] [facility] Research Datalink. Eur J Pain. 2019 May;23(5):908-922. doi: 10.1002/ejp.1357. Epub 2019 Jan 31. PMID: 78295621. Colameco S, Coren JS, Ciervo CA. Continuous opioid treatment for chronic noncancer pain: a time for moderation in prescribing. Postgrad Med. 2009 Jul;121(4):61-6. doi: 10.3810/pgm.2009.07.2032. PMID: 30865784. William Hamburger RN, Woodruff SD, Blazina I, Cristopher Peru, Bougatsos C, Deyo RA. The effectiveness and risks of long-term opioid therapy for chronic pain: a systematic review for a [employer] of [employer] Pathways to [employer]. Ann Intern Med. 2015 Feb 17;162(4):276-86. doi: 10.7326/M14-2559. PMID: 69629528. Caryl Bis [facility], Makuc DM. NCHS Data Brief No. 22. [place]: Centers for Disease Control and Prevention; 2009. Sep, Increase in Fatal Poisonings Involving Opioid Analgesics in the [place], 1999-2006. Song IA, Choi HR, Oh TK. Long-term opioid use and mortality in patients with chronic non-cancer pain: Ten-year follow-up study in [place] from 2010 through 2019. EClinicalMedicine. 2022 Jul 18;51:101558. doi: 10.1016/j.eclinm.2022.413244. PMID: 01027253; PMCID: [id number]. Huser, W., Schubert, T., Vogelmann, T. et al. All-cause mortality in patients with long-term opioid therapy compared with non-opioid analgesics for chronic non-cancer pain: a database study. BMC Med 18, 162 (2020). http://lester.info/ Rashidian H, Karie Kirks, Malekzadeh R, Haghdoost AA. An Ecological Study of the Association between Opiate Use and Incidence of Cancers. Addict Health. 2016 Fall;8(4):252-260. PMID: 59563875; PMCID: [id number].  Our Goal: Our goal is to control your pain with means other  than the use of opioid pain medications.  Our Recommendation: Talk to your physician about coming off of these medications. We can assist you with the tapering down and stopping these medicines. Based on the new information, even if you cannot completely stop the medication, a decrease in the dose may be associated with a lesser risk. Ask for other means of controlling the pain. Decrease or eliminate those factors that significantly contribute to your pain such as smoking, obesity, and a diet heavily tilted towards "inflammatory" nutrients.  Last Updated: 05/13/2023   ______________________________________________________________________       ______________________________________________________________________    National Pain Medication Shortage  The [place] is experiencing worsening drug shortages. These have had a negative widespread effect on patient care and treatment. Not expected to improve any time soon. Predicted to last past 2029.   Drug shortage list (generic names) Oxycodone IR Oxycodone/APAP Oxymorphone IR Hydromorphone Hydrocodone/APAP Morphine  Where is the problem?  Manufacturing and supply level.  Will this shortage  affect you?  Only if you take any of the above pain medications.  How? You may be unable to fill your prescription.  Your pharmacist may offer a "partial fill" of your prescription. (Warning: Do not accept partial fills.) Prescriptions partially filled cannot be transferred to another pharmacy. Read our Medication Rules and Regulation. Depending on how much medicine you are dependent on, you may experience withdrawals when unable to get the medication.  Recommendations: Consider ending your dependence on opioid pain medications. Ask your pain specialist to assist you with the process. Consider switching to a medication currently not in shortage, such as Buprenorphine. Talk to your pain specialist about this option. Consider decreasing your pain  medication requirements by managing tolerance thru "Drug Holidays". This may help minimize withdrawals, should you run out of medicine. Control your pain thru the use of non-pharmacological interventional therapies.   Your prescriber: Prescribers cannot be blamed for shortages. Medication manufacturing and supply issues cannot be fixed by the prescriber.   NOTE: The prescriber is not responsible for supplying the medication, or solving supply issues. Work with your pharmacist to solve it. The patient is responsible for the decision to take or continue taking the medication and for identifying and securing a legal supply source. By law, supplying the medication is the job and responsibility of the pharmacy. The prescriber is responsible for the evaluation, monitoring, and prescribing of these medications.   Prescribers will NOT: Re-issue prescriptions that have been partially filled. Re-issue prescriptions already sent to a pharmacy.  Re-send prescriptions to a different pharmacy because yours did not have your medication. Ask pharmacist to order more medicine or transfer the prescription to another pharmacy. (Read below.)  New 2023 regulation: "July 06, 2022 Revised Regulation Allows DEA-Registered Pharmacies to Transfer Electronic Prescriptions at a Patient's Request DEA Headquarters Division - Public Information Office Patients now have the ability to request their electronic prescription be transferred to another pharmacy without having to go back to their practitioner to initiate the request. This revised regulation went into effect on Monday, July 02, 2022.     At a patient's request, a DEA-registered retail pharmacy can now transfer an electronic prescription for a controlled substance (schedules II-V) to another DEA-registered retail pharmacy. Prior to this change, patients would have to go through their practitioner to cancel their prescription and have it re-issued to a different  pharmacy. The process was taxing and time consuming for both patients and practitioners.    The Drug Enforcement Administration [facility]) published its intent to revise the process for transferring electronic prescriptions on September 23, 2020.  The final rule was published in the federal register on May 31, 2022 and went into effect 30 days later.  Under the final rule, a prescription can only be transferred once between pharmacies, and only if allowed under existing state or other applicable law. The prescription must remain in its electronic form; may not be altered in any way; and the transfer must be communicated directly between two licensed pharmacists. It's important to note, any authorized refills transfer with the original prescription, which means the entire prescription will be filled at the same pharmacy".  Reference: [email or web address] [facility] website announcement)  [email or web address] [employer] of [employer])   [employer] / Vol. 88, No. 143 / Thursday, May 31, 2022 / Rules and Regulations DEPARTMENT OF JUSTICE  Drug Enforcement Administration  21 CFR Part 1306  [Docket No. [id number]]  RIN [id number] Transfer of Electronic Prescriptions for Schedules II-V Controlled Substances Between  Pharmacies for Initial Filling  ______________________________________________________________________       ______________________________________________________________________    Transfer of Pain Medication between Pharmacies  Re: 2023 DEA Clarification on existing regulation  Published on DEA Website: July 06, 2022  Title: Revised Regulation Allows DEA-Registered Pharmacies to Electrical engineer Prescriptions at a Patient's Request DEA Headquarters Division - [employer]  "Patients now have the ability to  request their electronic prescription be transferred to another pharmacy without having to go back to their practitioner to initiate the request. This revised regulation went into effect on Monday, July 02, 2022.     At a patient's request, a DEA-registered retail pharmacy can now transfer an electronic prescription for a controlled substance (schedules II-V) to another DEA-registered retail pharmacy. Prior to this change, patients would have to go through their practitioner to cancel their prescription and have it re-issued to a different pharmacy. The process was taxing and time consuming for both patients and practitioners.    The Drug Enforcement Administration [facility]) published its intent to revise the process for transferring electronic prescriptions on September 23, 2020.  The final rule was published in the federal register on May 31, 2022 and went into effect 30 days later.  Under the final rule, a prescription can only be transferred once between pharmacies, and only if allowed under existing state or other applicable law. The prescription must remain in its electronic form; may not be altered in any way; and the transfer must be communicated directly between two licensed pharmacists. It's important to note, any authorized refills transfer with the original prescription, which means the entire prescription will be filled at the same pharmacy."    REFERENCES: 1. DEA website announcement [email or web address]  2. Department of Justice website  [email or web address]  3. DEPARTMENT OF JUSTICE Drug Enforcement Administration 21 CFR Part 1306 [Docket No. [id number]] RIN [id number] "Transfer of Electronic Prescriptions for Schedules II-V Controlled Substances Between Pharmacies for Initial  Filling"  ______________________________________________________________________       ______________________________________________________________________    Medication Rules  Purpose: To inform patients, and their family members, of our medication rules and regulations.  Applies to: All patients receiving prescriptions from our practice (written or electronic).  Pharmacy of record: This is the pharmacy where your electronic prescriptions will be sent. Make sure we have the correct one.  Electronic prescriptions: In compliance with the [facility] Strengthen Opioid Misuse Prevention (STOP) Act of 2017 (Session Conni Elliot [phone]), effective November 05, 2018, all controlled substances must be electronically prescribed. Written prescriptions, faxing, or calling prescriptions to a pharmacy will no longer be done.  Prescription refills: These will be provided only during in-person appointments. No medications will be renewed without a "face-to-face" evaluation with your provider. Applies to all prescriptions.  NOTE: The following applies primarily to controlled substances (Opioid* Pain Medications).   Type of encounter (visit): For patients receiving controlled substances, face-to-face visits are required. (Not an option and not up to the patient.)  Patient's responsibilities: Pain Pills: Bring all pain pills to every appointment (except for procedure appointments). Pill Bottles: Bring pills in original pharmacy bottle. Bring bottle, even if empty. Always bring the bottle of the most recent fill.  Medication refills: You are responsible for knowing and keeping track of what medications you are taking and when is it that you will need a refill. The day before your appointment: write a list of all prescriptions that need to be refilled. The day of the appointment: give the list to the admitting nurse. Prescriptions will  be written only during appointments. No prescriptions will be written  on procedure days. If you forget a medication: it will not be "Called in", "Faxed", or "electronically sent". You will need to get another appointment to get these prescribed. No early refills. Do not call asking to have your prescription filled early. Partial  or short prescriptions: Occasionally your pharmacy may not have enough pills to fill your prescription.  NEVER ACCEPT a partial fill or a prescription that is short of the total amount of pills that you were prescribed.  With controlled substances the law allows 72 hours for the pharmacy to complete the prescription.  If the prescription is not completed within 72 hours, the pharmacist will require a new prescription to be written. This means that you will be short on your medicine and we WILL NOT send another prescription to complete your original prescription.  Instead, request the pharmacy to send a carrier to a nearby branch to get enough medication to provide you with your full prescription. Prescription Accuracy: You are responsible for carefully inspecting your prescriptions before leaving our office. Have the discharge nurse carefully go over each prescription with you, before taking them home. Make sure that your name is accurately spelled, that your address is correct. Check the name and dose of your medication to make sure it is accurate. Check the number of pills, and the written instructions to make sure they are clear and accurate. Make sure that you are given enough medication to last until your next medication refill appointment. Taking Medication: Take medication as prescribed. When it comes to controlled substances, taking less pills or less frequently than prescribed is permitted and encouraged. Never take more pills than instructed. Never take the medication more frequently than prescribed.  Inform other Doctors: Always inform, all of your healthcare providers, of all the medications you take. Pain Medication from other Providers:  You are not allowed to accept any additional pain medication from any other Doctor or Healthcare provider. There are two exceptions to this rule. (see below) In the event that you require additional pain medication, you are responsible for notifying us, as stated below. Cough Medicine: Often these contain an opioid, such as codeine or hydrocodone. Never accept or take cough medicine containing these opioids if you are already taking an opioid* medication. The combination may cause respiratory failure and death. Medication Agreement: You are responsible for carefully reading and following our Medication Agreement. This must be signed before receiving any prescriptions from our practice. Safely store a copy of your signed Agreement. Violations to the Agreement will result in no further prescriptions. (Additional copies of our Medication Agreement are available upon request.) Laws, Rules, & Regulations: All patients are expected to follow all [address] and [employer], Rules, [place]. Ignorance of the Laws does not constitute a valid excuse.  Illegal drugs and Controlled Substances: The use of illegal substances (including, but not limited to marijuana and its derivatives) and/or the illegal use of any controlled substances is strictly prohibited. Violation of this rule may result in the immediate and permanent discontinuation of any and all prescriptions being written by our practice. The use of any illegal substances is prohibited. Adopted CDC guidelines & recommendations: Target dosing levels will be at or below 60 MME/day. Use of benzodiazepines** is not recommended.  Exceptions: There are only two exceptions to the rule of not receiving pain medications from other Healthcare Providers. Exception #1 (Emergencies): In the event of an emergency (i.e.: accident requiring emergency  care), you are allowed to receive additional pain medication. However, you are responsible for: As soon as you are  able, call our office [phone], at any time of the day or night, and leave a message stating your name, the date and nature of the emergency, and the name and dose of the medication prescribed. In the event that your call is answered by a member of our staff, make sure to document and save the date, time, and the name of the person that took your information.  Exception #2 (Planned Surgery): In the event that you are scheduled by another doctor or dentist to have any type of surgery or procedure, you are allowed (for a period no longer than 30 days), to receive additional pain medication, for the acute post-op pain. However, in this case, you are responsible for picking up a copy of our "Post-op Pain Management for Surgeons" handout, and giving it to your surgeon or dentist. This document is available at our office, and does not require an appointment to obtain it. Simply go to our office during business hours (Monday-Thursday from 8:00 AM to 4:00 PM) (Friday 8:00 AM to 12:00 Noon) or if you have a scheduled appointment with [place], prior to your surgery, and ask for it by name. In addition, you are responsible for: calling our office [phone], at any time of the day or night, and leaving a message stating your name, name of your surgeon, type of surgery, and date of procedure or surgery. Failure to comply with your responsibilities may result in termination of therapy involving the controlled substances. Medication Agreement Violation. Following the above rules, including your responsibilities will help you in avoiding a Medication Agreement Violation ("Breaking your Pain Medication Contract").  Consequences:  Not following the above rules may result in permanent discontinuation of medication prescription therapy.  *Opioid medications include: morphine, codeine, oxycodone, oxymorphone, hydrocodone, hydromorphone, meperidine, tramadol, tapentadol, buprenorphine, fentanyl, methadone. **Benzodiazepine  medications include: diazepam (Valium), alprazolam (Xanax), clonazepam (Klonopine), lorazepam (Ativan), clorazepate (Tranxene), chlordiazepoxide (Librium), estazolam (Prosom), oxazepam (Serax), temazepam (Restoril), triazolam (Halcion) (Last updated: 08/28/2022) ______________________________________________________________________      ______________________________________________________________________    Medication Recommendations and Reminders  Applies to: All patients receiving prescriptions (written and/or electronic).  Medication Rules & Regulations: You are responsible for reading, knowing, and following our "Medication Rules" document. These exist for your safety and that of others. They are not flexible and neither are we. Dismissing or ignoring them is an act of "non-compliance" that may result in complete and irreversible termination of such medication therapy. For safety reasons, "non-compliance" will not be tolerated. As with the [place] fundamental legal principle of "ignorance of the law is no defense", we will accept no excuses for not having read and knowing the content of documents provided to you by our practice.  Pharmacy of record:  Definition: This is the pharmacy where your electronic prescriptions will be sent.  We do not endorse any particular pharmacy. It is up to you and your insurance to decide what pharmacy to use.  We do not restrict you in your choice of pharmacy. However, once we write for your prescriptions, we will NOT be re-sending more prescriptions to fix restricted supply problems created by your pharmacy, or your insurance.  The pharmacy listed in the electronic medical record should be the one where you want electronic prescriptions to be sent. If you choose to change pharmacy, simply notify our nursing staff. Changes will be made only during your regular appointments and  not over the phone.  Recommendations: Keep all of your pain medications in a safe  place, under lock and key, even if you live alone. We will NOT replace lost, stolen, or damaged medication. We do not accept "Police Reports" as proof of medications having been stolen. After you fill your prescription, take 1 week's worth of pills and put them away in a safe place. You should keep a separate, properly labeled bottle for this purpose. The remainder should be kept in the original bottle. Use this as your primary supply, until it runs out. Once it's gone, then you know that you have 1 week's worth of medicine, and it is time to come in for a prescription refill. If you do this correctly, it is unlikely that you will ever run out of medicine. To make sure that the above recommendation works, it is very important that you make sure your medication refill appointments are scheduled at least 1 week before you run out of medicine. To do this in an effective manner, make sure that you do not leave the office without scheduling your next medication management appointment. Always ask the nursing staff to show you in your prescription , when your medication will be running out. Then arrange for the receptionist to get you a return appointment, at least 7 days before you run out of medicine. Do not wait until you have 1 or 2 pills left, to come in. This is very poor planning and does not take into consideration that we may need to cancel appointments due to bad weather, sickness, or emergencies affecting our staff. DO NOT ACCEPT A "Partial Fill": If for any reason your pharmacy does not have enough pills/tablets to completely fill or refill your prescription, do not allow for a "partial fill". The law allows the pharmacy to complete that prescription within 72 hours, without requiring a new prescription. If they do not fill the rest of your prescription within those 72 hours, you will need a separate prescription to fill the remaining amount, which we will NOT provide. If the reason for the partial fill is  your insurance, you will need to talk to the pharmacist about payment alternatives for the remaining tablets, but again, DO NOT ACCEPT A PARTIAL FILL, unless you can trust your pharmacist to obtain the remainder of the pills within 72 hours.  Prescription refills and/or changes in medication(s):  Prescription refills, and/or changes in dose or medication, will be conducted only during scheduled medication management appointments. (Applies to both, written and electronic prescriptions.) No refills on procedure days. No medication will be changed or started on procedure days. No changes, adjustments, and/or refills will be conducted on a procedure day. Doing so will interfere with the diagnostic portion of the procedure. No phone refills. No medications will be "called into the pharmacy". No Fax refills. No weekend refills. No Holliday refills. No after hours refills.  Remember:  Business hours are:  Monday to Thursday 8:00 AM to 4:00 PM Provider's Schedule: Delano Metz, MD - Appointments are:  Medication management: Monday and Wednesday 8:00 AM to 4:00 PM Procedure day: Tuesday and Thursday 7:30 AM to 4:00 PM Edward Jolly, MD - Appointments are:  Medication management: Tuesday and Thursday 8:00 AM to 4:00 PM Procedure day: Monday and Wednesday 7:30 AM to 4:00 PM (Last update: 08/28/2022) ______________________________________________________________________      ______________________________________________________________________    Drug Holidays  What is a "Drug Holiday"? Drug Holiday: is the name given to the process of slowly  tapering down and temporarily stopping the pain medication for the purpose of decreasing or eliminating tolerance to the drug.  Benefits Improved effectiveness Decreased required effective dose Improved pain control End dependence on high dose therapy Decrease cost of therapy Uncovering "opioid-induced hyperalgesia". (OIH)  What is "opioid  hyperalgesia"? It is a paradoxical increase in pain caused by exposure to opioids. Stopping the opioid pain medication, contrary to the expected, it actually decreases or completely eliminates the pain. Ref.: "A comprehensive review of opioid-induced hyperalgesia". Donney Rankins, et.al. Pain Physician. 2011 Mar-Apr;14(2):145-61.  What is tolerance? Tolerance: the progressive loss of effectiveness of a pain medicine due to repetitive use. A common problem of opioid pain medications.  How long should a "Drug Holiday" last? Effectiveness depends on the patient staying off all opioid pain medicines for a minimum of 14 consecutive days. (2 weeks)  How about just taking less of the medicine? Does not work. Will not accomplish goal of eliminating the excess receptors.  How about switching to a different pain medicine? (AKA. "Opioid rotation") Does not work. Creates the illusion of effectiveness by taking advantage of inaccurate equivalent dose calculations between different opioids. -This "technique" was promoted by studies funded by [employer], such as [employer], creators of "OxyContin".  Can I stop the medicine "cold [place]"? We do not recommend it. You should always coordinate with your prescribing physician to make the transition as smoothly as possible. Avoid stopping the medicine abruptly without consulting. We recommend a "slow taper".  What is a slow taper? Taper: refers to the gradual decrease in dose.   How do I stop/taper the dose? Slowly. Decrease the daily amount of pills that you take by one (1) pill every seven (7) days. This is called a "slow downward taper". Example: if you normally take four (4) pills per day, drop it to three (3) pills per day for seven (7) days, then to two (2) pills per day for seven (7) days, then to one (1) per day for seven (7) days, and then stop the medicine. The 14 day "Drug Holiday" starts on the first day without medicine.   Will I  experience withdrawals? Unlikely with a slow taper.  What triggers withdrawals? Withdrawals are triggered by the sudden/abrupt stop of high dose opioids. Withdrawals can be avoided by slowly decreasing the dose over a prolonged period of time.  What are withdrawals? Symptoms associated with sudden/abrupt reduction/stopping of high-dose, long-term use of pain medication. Withdrawal are seldom seen on low dose therapy, or patients rarely taking opioid medication.  Early Withdrawal Symptoms may include: Agitation Anxiety Muscle aches Increased tearing Insomnia Runny nose Sweating Yawning  Late symptoms may include: Abdominal cramping Diarrhea Dilated pupils Goose bumps Nausea Vomiting  When could I see withdrawals? Onset: 8-24 hours after last use for most opioids. 12-48 hours for long-acting opioids (i.e.: methadone)  How long could they last? Duration: 4-10 days for most opioids. 14-21 days for long-acting opioids (i.e.: methadone)  What will happen after I complete my "Drug Holiday"? The need and indications for the opioid analgesic will be reviewed before restarting the medication. Dose requirements will likely decrease and the dose will need to be adjusted accordingly.   (Last update: 01/23/2023) ______________________________________________________________________      ______________________________________________________________________     Naloxone Nasal Spray  Why am I receiving this medication? [place] STOP ACT requires that all patients taking high dose opioids or at risk of opioids respiratory depression, be prescribed an opioid reversal agent, such as Naloxone (  AKA: Narcan).  What is this medication? NALOXONE (nal OX one) treats opioid overdose, which causes slow or shallow breathing, severe drowsiness, or trouble staying awake. Call emergency services after using this medication. You may need additional treatment. Naloxone works by reversing the  effects of opioids. It belongs to a group of medications called opioid blockers.  COMMON BRAND NAME(S): Kloxxado, Narcan  What should I tell my care team before I take this medication? They need to know if you have any of these conditions: Heart disease Substance use disorder An unusual or allergic reaction to naloxone, other medications, foods, dyes, or preservatives Pregnant or trying to get pregnant Breast-feeding  When to use this medication? This medication is to be used for the treatment of respiratory depression (less than 8 breaths per minute) secondary to opioid overdose.   How to use this medication? This medication is for use in the nose. Lay the person on their back. Support their neck with your hand and allow the head to tilt back before giving the medication. The nasal spray should be given into 1 nostril. After giving the medication, move the person onto their side. Do not remove or test the nasal spray until ready to use. Get emergency medical help right away after giving the first dose of this medication, even if the person wakes up. You should be familiar with how to recognize the signs and symptoms of a narcotic overdose. If more doses are needed, give the additional dose in the other nostril. Talk to your care team about the use of this medication in children. While this medication may be prescribed for children as young as newborns for selected conditions, precautions do apply.  Naloxone Overdosage: If you think you have taken too much of this medicine contact a poison control center or emergency room at once.  NOTE: This medicine is only for you. Do not share this medicine with others.  What if I miss a dose? This does not apply.  What may interact with this medication? This is only used during an emergency. No interactions are expected during emergency use. This list may not describe all possible interactions. Give your health care provider a list of all the  medicines, herbs, non-prescription drugs, or dietary supplements you use. Also tell them if you smoke, drink alcohol, or use illegal drugs. Some items may interact with your medicine.  What should I watch for while using this medication? Keep this medication ready for use in the case of an opioid overdose. Make sure that you have the phone number of your care team and local hospital ready. You may need to have additional doses of this medication. Each nasal spray contains a single dose. Some emergencies may require additional doses. After use, bring the treated person to the nearest hospital or call 911. Make sure the treating care team knows that the person has received a dose of this medication. You will receive additional instructions on what to do during and after use of this medication before an emergency occurs.  What side effects may I notice from receiving this medication? Side effects that you should report to your care team as soon as possible: Allergic reactions--skin rash, itching, hives, swelling of the face, lips, tongue, or throat Side effects that usually do not require medical attention (report these to your care team if they continue or are bothersome): Constipation Dryness or irritation inside the nose Headache Increase in blood pressure Muscle spasms Stuffy nose Toothache This list may not describe  all possible side effects. Call your doctor for medical advice about side effects. You may report side effects to FDA at 1-800-FDA-1088.  Where should I keep my medication? Because this is an emergency medication, you should keep it with you at all times.  Keep out of the reach of children and pets. Store between 20 and 25 degrees C (68 and 77 degrees F). Do not freeze. Throw away any unused medication after the expiration date. Keep in original box until ready to use.  NOTE: This sheet is a summary. It may not cover all possible information. If you have questions about this  medicine, talk to your doctor, pharmacist, or health care provider.   2023 Elsevier/Gold Standard (2021-06-30 00:00:00)  ______________________________________________________________________

## 2023-08-21 NOTE — Progress Notes (Signed)
Safety precautions to be maintained throughout the outpatient stay will include: orient to surroundings, keep bed in low position, maintain call bell within reach at all times, provide assistance with transfer out of bed and ambulation.   Nursing Pain Medication Assessment:  Safety precautions to be maintained throughout the outpatient stay will include: orient to surroundings, keep bed in low position, maintain call bell within reach at all times, provide assistance with transfer out of bed and ambulation.  Medication Inspection Compliance: Pill count conducted under aseptic conditions, in front of the patient. Neither the pills nor the bottle was removed from the patient's sight at any time. Once count was completed pills were immediately returned to the patient in their original bottle.  Medication: Tramadol (Ultram) Pill/Patch Count:  120 of 120 pills remain Pill/Patch Appearance: Markings consistent with prescribed medication Bottle Appearance: Standard pharmacy container. Clearly labeled. Filled Date: 83 / 37 / 2024 Last Medication intake:   pt denies taking; states it gave her anxiety; vomiting and diarrhea

## 2023-08-25 ENCOUNTER — Encounter: Payer: Self-pay | Admitting: Pain Medicine

## 2023-08-26 ENCOUNTER — Telehealth: Payer: Self-pay | Admitting: Pain Medicine

## 2023-08-26 NOTE — Telephone Encounter (Signed)
Patient notifed that her script has been approved.

## 2023-08-26 NOTE — Telephone Encounter (Signed)
Patient states her meds need PA

## 2023-09-16 ENCOUNTER — Inpatient Hospital Stay: Payer: MEDICAID | Attending: Internal Medicine | Admitting: Internal Medicine

## 2023-09-16 ENCOUNTER — Inpatient Hospital Stay: Payer: MEDICAID

## 2023-09-16 ENCOUNTER — Encounter: Payer: Self-pay | Admitting: Internal Medicine

## 2023-09-16 DIAGNOSIS — M1990 Unspecified osteoarthritis, unspecified site: Secondary | ICD-10-CM | POA: Insufficient documentation

## 2023-09-16 DIAGNOSIS — E119 Type 2 diabetes mellitus without complications: Secondary | ICD-10-CM | POA: Insufficient documentation

## 2023-09-16 DIAGNOSIS — K219 Gastro-esophageal reflux disease without esophagitis: Secondary | ICD-10-CM | POA: Insufficient documentation

## 2023-09-16 DIAGNOSIS — E538 Deficiency of other specified B group vitamins: Secondary | ICD-10-CM | POA: Insufficient documentation

## 2023-09-16 DIAGNOSIS — Z885 Allergy status to narcotic agent status: Secondary | ICD-10-CM | POA: Insufficient documentation

## 2023-09-16 DIAGNOSIS — G894 Chronic pain syndrome: Secondary | ICD-10-CM | POA: Insufficient documentation

## 2023-09-16 DIAGNOSIS — G35 Multiple sclerosis: Secondary | ICD-10-CM | POA: Insufficient documentation

## 2023-09-16 DIAGNOSIS — D509 Iron deficiency anemia, unspecified: Secondary | ICD-10-CM | POA: Insufficient documentation

## 2023-09-16 DIAGNOSIS — Z87891 Personal history of nicotine dependence: Secondary | ICD-10-CM | POA: Insufficient documentation

## 2023-09-16 DIAGNOSIS — R519 Headache, unspecified: Secondary | ICD-10-CM | POA: Insufficient documentation

## 2023-09-16 DIAGNOSIS — Z79899 Other long term (current) drug therapy: Secondary | ICD-10-CM | POA: Insufficient documentation

## 2023-09-16 DIAGNOSIS — Z90710 Acquired absence of both cervix and uterus: Secondary | ICD-10-CM | POA: Insufficient documentation

## 2023-09-27 ENCOUNTER — Inpatient Hospital Stay (HOSPITAL_BASED_OUTPATIENT_CLINIC_OR_DEPARTMENT_OTHER): Payer: MEDICAID | Admitting: Internal Medicine

## 2023-09-27 ENCOUNTER — Other Ambulatory Visit: Payer: MEDICAID

## 2023-09-27 VITALS — BP 112/64 | HR 105 | Temp 98.4°F | Wt 228.0 lb

## 2023-09-27 DIAGNOSIS — D649 Anemia, unspecified: Secondary | ICD-10-CM

## 2023-09-27 DIAGNOSIS — D509 Iron deficiency anemia, unspecified: Secondary | ICD-10-CM

## 2023-09-27 DIAGNOSIS — Z90710 Acquired absence of both cervix and uterus: Secondary | ICD-10-CM

## 2023-09-27 DIAGNOSIS — Z79899 Other long term (current) drug therapy: Secondary | ICD-10-CM | POA: Diagnosis not present

## 2023-09-27 DIAGNOSIS — G894 Chronic pain syndrome: Secondary | ICD-10-CM | POA: Diagnosis not present

## 2023-09-27 DIAGNOSIS — K219 Gastro-esophageal reflux disease without esophagitis: Secondary | ICD-10-CM

## 2023-09-27 DIAGNOSIS — Z87891 Personal history of nicotine dependence: Secondary | ICD-10-CM | POA: Diagnosis not present

## 2023-09-27 DIAGNOSIS — E119 Type 2 diabetes mellitus without complications: Secondary | ICD-10-CM

## 2023-09-27 DIAGNOSIS — G35 Multiple sclerosis: Secondary | ICD-10-CM

## 2023-09-27 DIAGNOSIS — Z885 Allergy status to narcotic agent status: Secondary | ICD-10-CM

## 2023-09-27 DIAGNOSIS — E538 Deficiency of other specified B group vitamins: Secondary | ICD-10-CM | POA: Diagnosis not present

## 2023-09-27 DIAGNOSIS — R519 Headache, unspecified: Secondary | ICD-10-CM | POA: Diagnosis not present

## 2023-09-27 DIAGNOSIS — M1990 Unspecified osteoarthritis, unspecified site: Secondary | ICD-10-CM

## 2023-09-27 NOTE — Progress Notes (Signed)
[facility]  [phone]) [phone] [phone]) [phone]  ID: Shannon Benitez OB: 1971/07/19  MR#: [id number]  XLK#:[id number]  Patient Care Team: Jenell Milliner, MD as PCP - General (Internal Medicine)  REFERRING PROVIDER: Benison Mangal  REASON FOR REFERRAL: Iron deficiency anemia  HPI: Shannon Benitez is a 52 y.o. female with past medical history of multiple sclerosis, diabetes, GERD, iron deficiency anemia, chronic pain syndrome referred to hematology for management of iron deficiency anemia.  Patient reports longstanding history of iron deficiency.  Has taken iron infusions previously.  Recently she has been feeling more tired and sleepy.  She is on oral iron pills for the past 6 months.  Never had endoscopy and colonoscopy.  Reports she was recommended Cologuard which she has not been able to do.  Has issues with chronic headaches.  Is on Excedrin and at times may have to take 12 pills a day.  Uses ibuprofen 2 to 4 pills a day.  Has history of MS-diagnosed 18 years ago.   REVIEW OF SYSTEMS:   ROS  As per HPI. Otherwise, a complete review of systems is negative.  PAST MEDICAL HISTORY: Past Medical History:  Diagnosis Date   Acute postoperative pain 04/30/2019   Diabetes mellitus without complication (HCC)    GERD (gastroesophageal reflux disease)    Headache    daily   MS (multiple sclerosis) (HCC)    Numbness and tingling of both legs below knees    pt reports secondary to MS   Osteoarthritis    lumbar   Vertigo    last episode opver 18 yrs ago   Wears dentures    full upper and lower    PAST SURGICAL HISTORY: Past Surgical History:  Procedure Laterality Date   ABDOMINAL HYSTERECTOMY     CATARACT EXTRACTION W/PHACO Right 03/18/2019   Procedure: CATARACT EXTRACTION PHACO AND INTRAOCULAR LENS PLACEMENT (IOC) RIGHT;  Surgeon: Nevada Crane, MD;  Location: [facility];  Service: Ophthalmology;  Laterality: Right;  Diabetic - oral  meds Latex sensitivity   CATARACT EXTRACTION W/PHACO Left 04/13/2019   Procedure: CATARACT EXTRACTION PHACO AND INTRAOCULAR LENS PLACEMENT (IOC)  LEFT;  Surgeon: Nevada Crane, MD;  Location: [facility];  Service: Ophthalmology;  Laterality: Left;  Diabetic - oral meds   CESAREAN SECTION     TUMOR REMOVAL Left    pt was 52 years old    FAMILY HISTORY: Family History  Adopted: Yes    HEALTH MAINTENANCE: Social History   Tobacco Use   Smoking status: Former    Current packs/day: 0.00    Average packs/day: 0.5 packs/day for 18.0 years (9.0 ttl pk-yrs)    Types: Cigarettes    Start date: 12/19/1998    Quit date: 12/19/2016    Years since quitting: 6.7   Smokeless tobacco: Former  Advertising account planner   Vaping status: Every Day   Start date: 02/03/2018   Substances: Flavoring   Devices: Smock  Substance Use Topics   Alcohol use: Never     Allergies  Allergen Reactions   5-Alpha Reductase Inhibitors    Desvenlafaxine     Allergic reaction   Glatiramer Other (See Comments)    Pt states me gave her cold spells.    Rebif [Interferon Beta-1a] Other (See Comments)    The skin around the injection site turned black   Gramineae Pollens Itching   Latex Itching    Only when gloves are worn by pt   Tramadol Diarrhea, Nausea And Vomiting and  Anxiety    Current Outpatient Medications  Medication Sig Dispense Refill   aspirin-acetaminophen-caffeine (EXCEDRIN MIGRAINE) 250-250-65 MG tablet Take by mouth every 6 (six) hours as needed for headache.     baclofen (LIORESAL) 20 MG tablet Take 1 tablet (20 mg total) by mouth 4 (four) times daily. 120 tablet 2   Cholecalciferol 1.25 MG (50000 UT) capsule Take 50,000 Units by mouth once a week.     Dimethyl Fumarate 240 MG CPDR Take by mouth.     lamoTRIgine (LAMICTAL) 25 MG tablet Take 50 mg by mouth 2 (two) times daily.     levETIRAcetam (KEPPRA) 250 MG tablet Take by mouth.     loperamide (IMODIUM A-D) 2 MG tablet Take 2 mg by mouth  as needed for diarrhea or loose stools.     meloxicam (MOBIC) 15 MG tablet Take 1 tablet by mouth daily.     metFORMIN (GLUCOPHAGE-XR) 500 MG 24 hr tablet Take 250 mg by mouth daily with breakfast.      mirabegron ER (MYRBETRIQ) 50 MG TB24 tablet Take 50 mg by mouth daily.      naloxone (NARCAN) nasal spray 4 mg/0.1 mL Place 1 spray into the nose as needed for up to 365 doses (for opioid-induced respiratory depresssion). In case of emergency (overdose), spray once into each nostril. If no response within 3 minutes, repeat application and call 911. 1 each 0   omeprazole (PRILOSEC) 20 MG capsule Take 1 capsule by mouth daily.     oxyCODONE (OXY IR/ROXICODONE) 5 MG immediate release tablet Take 1 tablet (5 mg total) by mouth every 6 (six) hours as needed for severe pain (pain score 7-10). Must last 30 days. 120 tablet 0   oxyCODONE (OXY IR/ROXICODONE) 5 MG immediate release tablet Take 1 tablet (5 mg total) by mouth every 6 (six) hours as needed for severe pain (pain score 7-10). Must last 30 days. 120 tablet 0   [START ON 10/20/2023] oxyCODONE (OXY IR/ROXICODONE) 5 MG immediate release tablet Take 1 tablet (5 mg total) by mouth every 6 (six) hours as needed for severe pain (pain score 7-10). Must last 30 days. 120 tablet 0   pravastatin (PRAVACHOL) 20 MG tablet Take 1 tablet by mouth daily.     pregabalin (LYRICA) 50 MG capsule Take 50 mg by mouth 2 (two) times daily.     primidone (MYSOLINE) 50 MG tablet Take 50 mg by mouth 2 (two) times daily. 2 tablets twice per day     propranolol (INDERAL) 40 MG tablet Take by mouth.     sitaGLIPtin (JANUVIA) 100 MG tablet Take 1 tablet by mouth daily.     No current facility-administered medications for this visit.    OBJECTIVE: Vitals:   09/27/23 1143  BP: 112/64  Pulse: (!) 105  Temp: 98.4 F (36.9 C)  SpO2: 95%     Body mass index is 43.08 kg/m.      General: Well-developed, well-nourished, no acute distress. Eyes: Pink conjunctiva, anicteric  sclera. HEENT: Normocephalic, moist mucous membranes, clear oropharnyx. Lungs: Clear to auscultation bilaterally. Heart: Regular rate and rhythm. No rubs, murmurs, or gallops. Abdomen: Soft, nontender, nondistended. No organomegaly noted, normoactive bowel sounds. Musculoskeletal: No edema, cyanosis, or clubbing. Neuro: Alert, answering all questions appropriately. Cranial nerves grossly intact. Skin: No rashes or petechiae noted. Psych: Normal affect. Lymphatics: No cervical, calvicular, axillary or inguinal LAD.   LAB RESULTS:  Lab Results  Component Value Date   NA 138 10/01/2022   K 3.4 (L)  10/01/2022   CL 103 10/01/2022   CO2 24 10/01/2022   GLUCOSE 357 (H) 10/01/2022   BUN 19 10/01/2022   CREATININE 0.59 10/01/2022   CALCIUM 9.7 10/01/2022   PROT 9.0 (H) 10/01/2022   ALBUMIN 4.0 10/01/2022   AST 18 10/01/2022   ALT 14 10/01/2022   ALKPHOS 91 10/01/2022   BILITOT 0.7 10/01/2022   GFRNONAA >60 10/01/2022   GFRAA >60 09/25/2019    Lab Results  Component Value Date   WBC 9.2 10/01/2022   NEUTROABS 4.9 11/06/2020   HGB 11.0 (L) 10/01/2022   HCT 35.2 (L) 10/01/2022   MCV 70.1 (L) 10/01/2022   PLT 362 10/01/2022    No results found for: "TIBC", "FERRITIN", "IRONPCTSAT"   STUDIES: No results found.  ASSESSMENT AND PLAN:   Shannon Benitez is a 52 y.o. female with pmh of multiple sclerosis, diabetes, GERD, iron deficiency anemia, chronic pain syndrome referred to hematology for management of iron deficiency anemia.  # Iron deficiency anemia -Reports longstanding history.  Of unknown etiology.  Prior history of iron infusions.  Not responding to oral iron -Labs reviewed.  Hemoglobin of 10.7.  Ferritin 5 and saturation 8%. -Discussed about IV Venofer 200 mg weekly x 5 doses.  Side effects such as back pain, chest pain, allergic reaction was discussed.  Patient agreeable to proceed. -She never had colonoscopy and endoscopy.  We discussed about GI referral which  she would like to hold off.  Reports she was supposed to get Cologuard test done which she is procrastinating she tells me.  # Borderline low vitamin B12 -B12 244.  Continue with B12 supplements for now. -Recheck level in 3 months.  Orders Placed This Encounter  Procedures   CBC with Differential (Cancer Center Only)   Ferritin   Iron and TIBC   Vitamin B12   Ambulatory referral to Social Work   RTC in 3 months for MD visit, labs, Venofer  Patient expressed understanding and was in agreement with this plan. She also understands that She can call clinic at any time with any questions, concerns, or complaints.   I spent a total of 45 minutes reviewing chart data, face-to-face evaluation with the patient, counseling and coordination of care as detailed above.  Michaelyn Barter, MD   09/27/2023 1:57 PM

## 2023-09-30 ENCOUNTER — Inpatient Hospital Stay: Payer: MEDICAID

## 2023-09-30 NOTE — Progress Notes (Signed)
[facility] Clinical Social Work  Initial Assessment   Shannon Benitez is a 52 y.o. year old female contacted by phone. Clinical Social Work was referred by medical provider for assessment of psychosocial needs.   SDOH (Social Determinants of Health) assessments performed: Yes   SDOH Screenings   Food Insecurity: Food Insecurity Present (09/27/2023)  Housing: High Risk (09/27/2023)  Transportation Needs: No Transportation Needs (09/27/2023)  Utilities: At Risk (09/27/2023)  Depression (PHQ2-9): Medium Risk (09/27/2023)  Tobacco Use: Medium Risk (09/03/2023)   Received from [facility] Literacy: Low Risk  (02/13/2021)   Received from [facility]     Distress Screen completed: No     No data to display            Family/Social Information:  Housing Arrangement: patient lives with her 36 year old son. Family members/support persons in your life? Family Transportation concerns: no  Employment: Legally disabled  Income source: Secretary/administrator concerns: Yes, due to illness and/or loss of work during treatment Type of concern: Food Food access concerns: yes Religious or spiritual practice: Yes-Identifies as Psychologist, educational Currently in place:  [facility]  Coping/ Adjustment to diagnosis: Patient understands treatment plan and what happens next? yes Concerns about diagnosis and/or treatment:  Qualify of life/ Patient reported stressors: Therapist, art and/or priorities: Family Current coping skills/ strengths: Capable of independent living , Manufacturing systems engineer , General fund of knowledge , and Supportive family/friends     SUMMARY: Current SDOH Barriers:  Financial constraints related to fixed income.  Clinical Social Work Clinical Goal(s):  Explore community resource options for unmet needs related to:  Financial Strain   Interventions: Discussed common feeling and emotions when being diagnosed with  cancer, and the importance of support during treatment Informed patient of the support team roles and support services at [facility] Provided CSW contact information and encouraged patient to call with any questions or concerns Provided patient with information about [employer] and [facility].  Will follow up with patient with any MS support.   Follow Up Plan: Patient will contact CSW with any support or resource needs Patient verbalizes understanding of plan: Yes    Dorothey Baseman, LCSW Clinical Social Worker [facility]

## 2023-10-07 ENCOUNTER — Encounter: Payer: MEDICAID | Admitting: Pain Medicine

## 2023-10-11 ENCOUNTER — Inpatient Hospital Stay: Payer: MEDICAID

## 2023-10-18 ENCOUNTER — Inpatient Hospital Stay: Payer: MEDICAID | Attending: Internal Medicine

## 2023-10-18 VITALS — BP 100/56 | HR 98 | Temp 97.9°F | Resp 18

## 2023-10-18 DIAGNOSIS — D509 Iron deficiency anemia, unspecified: Secondary | ICD-10-CM | POA: Insufficient documentation

## 2023-10-18 DIAGNOSIS — Z79899 Other long term (current) drug therapy: Secondary | ICD-10-CM | POA: Insufficient documentation

## 2023-10-18 MED ORDER — IRON SUCROSE 20 MG/ML IV SOLN
200.0000 mg | Freq: Once | INTRAVENOUS | Status: AC
  Administered 2023-10-18: 200 mg via INTRAVENOUS

## 2023-10-18 MED ORDER — SODIUM CHLORIDE 0.9% FLUSH
10.0000 mL | Freq: Two times a day (BID) | INTRAVENOUS | Status: DC
Start: 2023-10-18 — End: 2023-10-18
  Administered 2023-10-18: 10 mL via INTRAVENOUS
  Filled 2023-10-18: qty 10

## 2023-10-18 NOTE — Patient Instructions (Signed)
Iron Sucrose Injection What is this medication? IRON SUCROSE (EYE ern SOO krose) treats low levels of iron (iron deficiency anemia) in people with kidney disease. Iron is a mineral that plays an important role in making red blood cells, which carry oxygen from your lungs to the rest of your body. This medicine may be used for other purposes; ask your health care provider or pharmacist if you have questions. COMMON BRAND NAME(S): Venofer What should I tell my care team before I take this medication? They need to know if you have any of these conditions: Anemia not caused by low iron levels Heart disease High levels of iron in the blood Kidney disease Liver disease An unusual or allergic reaction to iron, other medications, foods, dyes, or preservatives Pregnant or trying to get pregnant Breastfeeding How should I use this medication? This medication is for infusion into a vein. It is given in a hospital or clinic setting. Talk to your care team about the use of this medication in children. While this medication may be prescribed for children as young as 2 years for selected conditions, precautions do apply. Overdosage: If you think you have taken too much of this medicine contact a poison control center or emergency room at once. NOTE: This medicine is only for you. Do not share this medicine with others. What if I miss a dose? Keep appointments for follow-up doses. It is important not to miss your dose. Call your care team if you are unable to keep an appointment. What may interact with this medication? Do not take this medication with any of the following: Deferoxamine Dimercaprol Other iron products This medication may also interact with the following: Chloramphenicol Deferasirox This list may not describe all possible interactions. Give your health care provider a list of all the medicines, herbs, non-prescription drugs, or dietary supplements you use. Also tell them if you smoke,  drink alcohol, or use illegal drugs. Some items may interact with your medicine. What should I watch for while using this medication? Visit your care team regularly. Tell your care team if your symptoms do not start to get better or if they get worse. You may need blood work done while you are taking this medication. You may need to follow a special diet. Talk to your care team. Foods that contain iron include: whole grains/cereals, dried fruits, beans, or peas, leafy green vegetables, and organ meats (liver, kidney). What side effects may I notice from receiving this medication? Side effects that you should report to your care team as soon as possible: Allergic reactions--skin rash, itching, hives, swelling of the face, lips, tongue, or throat Low blood pressure--dizziness, feeling faint or lightheaded, blurry vision Shortness of breath Side effects that usually do not require medical attention (report to your care team if they continue or are bothersome): Flushing Headache Joint pain Muscle pain Nausea Pain, redness, or irritation at injection site This list may not describe all possible side effects. Call your doctor for medical advice about side effects. You may report side effects to FDA at 1-800-FDA-1088. Where should I keep my medication? This medication is given in a hospital or clinic. It will not be stored at home. NOTE: This sheet is a summary. It may not cover all possible information. If you have questions about this medicine, talk to your doctor, pharmacist, or health care provider.  2024 Elsevier/Gold Standard (2023-03-29 00:00:00)

## 2023-10-25 ENCOUNTER — Inpatient Hospital Stay: Payer: MEDICAID

## 2023-10-25 VITALS — BP 103/71 | HR 67 | Temp 99.0°F | Resp 18

## 2023-10-25 DIAGNOSIS — D509 Iron deficiency anemia, unspecified: Secondary | ICD-10-CM | POA: Diagnosis not present

## 2023-10-25 MED ORDER — IRON SUCROSE 20 MG/ML IV SOLN
200.0000 mg | Freq: Once | INTRAVENOUS | Status: AC
  Administered 2023-10-25: 200 mg via INTRAVENOUS
  Filled 2023-10-25: qty 10

## 2023-10-25 MED ORDER — SODIUM CHLORIDE 0.9% FLUSH
10.0000 mL | Freq: Once | INTRAVENOUS | Status: AC | PRN
  Administered 2023-10-25: 10 mL
  Filled 2023-10-25: qty 10

## 2023-10-25 NOTE — Progress Notes (Signed)
 Patient tolerated Venofer infusion well. Explained recommendation of 30 min post monitoring. Patient refused to wait post monitoring. Educated on what signs to watch for & to call with any concerns. No questions/symptoms, discharged. Stable

## 2023-10-25 NOTE — Patient Instructions (Signed)
Iron Sucrose Injection What is this medication? IRON SUCROSE (EYE ern SOO krose) treats low levels of iron (iron deficiency anemia) in people with kidney disease. Iron is a mineral that plays an important role in making red blood cells, which carry oxygen from your lungs to the rest of your body. This medicine may be used for other purposes; ask your health care provider or pharmacist if you have questions. COMMON BRAND NAME(S): Venofer What should I tell my care team before I take this medication? They need to know if you have any of these conditions: Anemia not caused by low iron levels Heart disease High levels of iron in the blood Kidney disease Liver disease An unusual or allergic reaction to iron, other medications, foods, dyes, or preservatives Pregnant or trying to get pregnant Breastfeeding How should I use this medication? This medication is for infusion into a vein. It is given in a hospital or clinic setting. Talk to your care team about the use of this medication in children. While this medication may be prescribed for children as young as 2 years for selected conditions, precautions do apply. Overdosage: If you think you have taken too much of this medicine contact a poison control center or emergency room at once. NOTE: This medicine is only for you. Do not share this medicine with others. What if I miss a dose? Keep appointments for follow-up doses. It is important not to miss your dose. Call your care team if you are unable to keep an appointment. What may interact with this medication? Do not take this medication with any of the following: Deferoxamine Dimercaprol Other iron products This medication may also interact with the following: Chloramphenicol Deferasirox This list may not describe all possible interactions. Give your health care provider a list of all the medicines, herbs, non-prescription drugs, or dietary supplements you use. Also tell them if you smoke,  drink alcohol, or use illegal drugs. Some items may interact with your medicine. What should I watch for while using this medication? Visit your care team regularly. Tell your care team if your symptoms do not start to get better or if they get worse. You may need blood work done while you are taking this medication. You may need to follow a special diet. Talk to your care team. Foods that contain iron include: whole grains/cereals, dried fruits, beans, or peas, leafy green vegetables, and organ meats (liver, kidney). What side effects may I notice from receiving this medication? Side effects that you should report to your care team as soon as possible: Allergic reactions--skin rash, itching, hives, swelling of the face, lips, tongue, or throat Low blood pressure--dizziness, feeling faint or lightheaded, blurry vision Shortness of breath Side effects that usually do not require medical attention (report to your care team if they continue or are bothersome): Flushing Headache Joint pain Muscle pain Nausea Pain, redness, or irritation at injection site This list may not describe all possible side effects. Call your doctor for medical advice about side effects. You may report side effects to FDA at 1-800-FDA-1088. Where should I keep my medication? This medication is given in a hospital or clinic. It will not be stored at home. NOTE: This sheet is a summary. It may not cover all possible information. If you have questions about this medicine, talk to your doctor, pharmacist, or health care provider.  2024 Elsevier/Gold Standard (2023-03-29 00:00:00)

## 2023-11-01 ENCOUNTER — Inpatient Hospital Stay: Payer: MEDICAID

## 2023-11-01 VITALS — BP 99/68 | HR 69 | Temp 98.6°F | Resp 18

## 2023-11-01 DIAGNOSIS — D509 Iron deficiency anemia, unspecified: Secondary | ICD-10-CM | POA: Diagnosis not present

## 2023-11-01 MED ORDER — IRON SUCROSE 20 MG/ML IV SOLN
200.0000 mg | Freq: Once | INTRAVENOUS | Status: AC
  Administered 2023-11-01: 200 mg via INTRAVENOUS
  Filled 2023-11-01: qty 10

## 2023-11-01 MED ORDER — SODIUM CHLORIDE 0.9% FLUSH
10.0000 mL | Freq: Once | INTRAVENOUS | Status: AC | PRN
  Administered 2023-11-01: 10 mL
  Filled 2023-11-01: qty 10

## 2023-11-08 ENCOUNTER — Inpatient Hospital Stay: Payer: MEDICAID | Attending: Internal Medicine

## 2023-11-08 VITALS — BP 106/55 | HR 66 | Temp 96.6°F | Resp 16

## 2023-11-08 DIAGNOSIS — D509 Iron deficiency anemia, unspecified: Secondary | ICD-10-CM | POA: Insufficient documentation

## 2023-11-08 DIAGNOSIS — Z79899 Other long term (current) drug therapy: Secondary | ICD-10-CM | POA: Insufficient documentation

## 2023-11-08 MED ORDER — IRON SUCROSE 20 MG/ML IV SOLN
200.0000 mg | Freq: Once | INTRAVENOUS | Status: AC
  Administered 2023-11-08: 200 mg via INTRAVENOUS
  Filled 2023-11-08: qty 10

## 2023-11-08 NOTE — Progress Notes (Signed)
 Pt tolerated treatment without concerns.  VSS.  Pt refused 30 minute post observation.  Pt understands risks.

## 2023-11-08 NOTE — Patient Instructions (Signed)
 Iron Sucrose Injection What is this medication? IRON SUCROSE (EYE ern SOO krose) treats low levels of iron (iron deficiency anemia) in people with kidney disease. Iron is a mineral that plays an important role in making red blood cells, which carry oxygen from your lungs to the rest of your body. This medicine may be used for other purposes; ask your health care provider or pharmacist if you have questions. COMMON BRAND NAME(S): Venofer What should I tell my care team before I take this medication? They need to know if you have any of these conditions: Anemia not caused by low iron levels Heart disease High levels of iron in the blood Kidney disease Liver disease An unusual or allergic reaction to iron, other medications, foods, dyes, or preservatives Pregnant or trying to get pregnant Breastfeeding How should I use this medication? This medication is for infusion into a vein. It is given in a hospital or clinic setting. Talk to your care team about the use of this medication in children. While this medication may be prescribed for children as young as 2 years for selected conditions, precautions do apply. Overdosage: If you think you have taken too much of this medicine contact a poison control center or emergency room at once. NOTE: This medicine is only for you. Do not share this medicine with others. What if I miss a dose? Keep appointments for follow-up doses. It is important not to miss your dose. Call your care team if you are unable to keep an appointment. What may interact with this medication? Do not take this medication with any of the following: Deferoxamine Dimercaprol Other iron products This medication may also interact with the following: Chloramphenicol Deferasirox This list may not describe all possible interactions. Give your health care provider a list of all the medicines, herbs, non-prescription drugs, or dietary supplements you use. Also tell them if you smoke,  drink alcohol, or use illegal drugs. Some items may interact with your medicine. What should I watch for while using this medication? Visit your care team regularly. Tell your care team if your symptoms do not start to get better or if they get worse. You may need blood work done while you are taking this medication. You may need to follow a special diet. Talk to your care team. Foods that contain iron include: whole grains/cereals, dried fruits, beans, or peas, leafy green vegetables, and organ meats (liver, kidney). What side effects may I notice from receiving this medication? Side effects that you should report to your care team as soon as possible: Allergic reactions--skin rash, itching, hives, swelling of the face, lips, tongue, or throat Low blood pressure--dizziness, feeling faint or lightheaded, blurry vision Shortness of breath Side effects that usually do not require medical attention (report to your care team if they continue or are bothersome): Flushing Headache Joint pain Muscle pain Nausea Pain, redness, or irritation at injection site This list may not describe all possible side effects. Call your doctor for medical advice about side effects. You may report side effects to FDA at 1-800-FDA-1088. Where should I keep my medication? This medication is given in a hospital or clinic. It will not be stored at home. NOTE: This sheet is a summary. It may not cover all possible information. If you have questions about this medicine, talk to your doctor, pharmacist, or health care provider.  2024 Elsevier/Gold Standard (2023-03-29 00:00:00)

## 2023-11-14 ENCOUNTER — Inpatient Hospital Stay: Payer: MEDICAID

## 2023-11-14 VITALS — BP 104/78 | HR 70 | Temp 97.8°F | Resp 18

## 2023-11-14 DIAGNOSIS — D509 Iron deficiency anemia, unspecified: Secondary | ICD-10-CM

## 2023-11-14 MED ORDER — IRON SUCROSE 20 MG/ML IV SOLN
200.0000 mg | Freq: Once | INTRAVENOUS | Status: AC
  Administered 2023-11-14: 200 mg via INTRAVENOUS
  Filled 2023-11-14: qty 10

## 2023-11-14 MED ORDER — SODIUM CHLORIDE 0.9% FLUSH
10.0000 mL | Freq: Once | INTRAVENOUS | Status: AC | PRN
Start: 2023-11-14 — End: 2023-11-14
  Administered 2023-11-14: 10 mL
  Filled 2023-11-14: qty 10

## 2023-11-15 ENCOUNTER — Inpatient Hospital Stay: Payer: MEDICAID

## 2023-11-17 NOTE — Patient Instructions (Signed)
 ______________________________________________________________________    Patient Information update  To: All of our patients.  Re: Name change.  It has been made official that our current name, "[facility]"   will soon be changed to "[place] [facility]".   The purpose of this change is to eliminate any confusion created by the concept of our practice being a "Medication Management Pain Clinic". In the past this has led to the misconception that we treat pain primarily by the use of prescription medications.  Nothing can be farther from the truth.   Understanding PAIN MANAGEMENT: To further understand what our practice does, you first have to understand that "Pain Management" is a subspecialty that requires additional training once a physician has completed their specialty training, which can be in either Anesthesia, Neurology, Psychiatry, or Physical Medicine and Rehabilitation (PMR). Each one of these contributes to the final approach taken by each physician to the management of their patient's pain. To be a "Pain Management Specialist" you must have first completed one of the specialty trainings below.  Anesthesiologists - trained in clinical pharmacology and interventional techniques such as nerve blockade and regional as well as central neuroanatomy. They are trained to block pain before, during, and after surgical interventions.  Neurologists - trained in the diagnosis and pharmacological treatment of complex neurological conditions, such as Multiple Sclerosis, Parkinson's, spinal cord injuries, and other systemic conditions that may be associated with symptoms that may include but are not limited to pain. They tend to rely primarily on the treatment of chronic pain using prescription medications.  Psychiatrist - trained in conditions affecting the psychosocial wellbeing of patients  including but not limited to depression, anxiety, schizophrenia, personality disorders, addiction, and other substance use disorders that may be associated with chronic pain. They tend to rely primarily on the treatment of chronic pain using prescription medications.   Physical Medicine and Rehabilitation (PMR) physicians, also known as physiatrists - trained to treat a wide variety of medical conditions affecting the brain, spinal cord, nerves, bones, joints, ligaments, muscles, and tendons. Their training is primarily aimed at treating patients that have suffered injuries that have caused severe physical impairment. Their training is primarily aimed at the physical therapy and rehabilitation of those patients. They may also work alongside orthopedic surgeons or neurosurgeons using their expertise in assisting surgical patients to recover after their surgeries.  INTERVENTIONAL PAIN MANAGEMENT is sub-subspecialty of Pain Management.  Our physicians are Board-certified in Anesthesia, Pain Management, and Interventional Pain Management.  This meaning that not only have they been trained and Board-certified in their specialty of Anesthesia, and subspecialty of Pain Management, but they have also received further training in the sub-subspecialty of Interventional Pain Management, in order to become Board-certified as INTERVENTIONAL PAIN MANAGEMENT SPECIALIST.    Mission: Our goal is to use our skills in  INTERVENTIONAL PAIN MANAGEMENT as alternatives to the chronic use of prescription opioid medications for the treatment of pain. To make this more clear, we have changed our name to reflect what we do and offer. We will continue to offer medication management assessment and recommendations, but we will not be taking over any patient's medication management.  ______________________________________________________________________       ______________________________________________________________________     Opioid Pain Medication Update  To: All patients taking opioid pain medications. (I.e.: hydrocodone , hydromorphone, oxycodone , oxymorphone, morphine , codeine, methadone, tapentadol, tramadol , buprenorphine, fentanyl , etc.)  Re: Updated review of side effects and adverse reactions of  opioid analgesics, as well as new information about long term effects of this class of medications.  Direct risks of long-term opioid therapy are not limited to opioid addiction and overdose. Potential medical risks include serious fractures, breathing problems during sleep, hyperalgesia, immunosuppression, chronic constipation, bowel obstruction, myocardial infarction, and tooth decay secondary to xerostomia.  Unpredictable adverse effects that can occur even if you take your medication correctly: Cognitive impairment, respiratory depression, and death. Most people think that if they take their medication correctly, and as instructed, that they will be safe. Nothing could be farther from the truth. In reality, a significant amount of recorded deaths associated with the use of opioids has occurred in individuals that had taken the medication for a long time, and were taking their medication correctly. The following are examples of how this can happen: Patient taking his/her medication for a long time, as instructed, without any side effects, is given a certain antibiotic or another unrelated medication, which in turn triggers a Drug-to-drug interaction leading to disorientation, cognitive impairment, impaired reflexes, respiratory depression or an untoward event leading to serious bodily harm or injury, including death.  Patient taking his/her medication for a long time, as instructed, without any side effects, develops an acute impairment of liver and/or kidney function. This will lead to a rapid inability of the body to breakdown and eliminate their pain medication, which will result in effects similar to an  overdose, but with the same medicine and dose that they had always taken. This again may lead to disorientation, cognitive impairment, impaired reflexes, respiratory depression or an untoward event leading to serious bodily harm or injury, including death.  A similar problem will occur with patients as they grow older and their liver and kidney function begins to decrease as part of the aging process.  Background information: Historically, the original case for using long-term opioid therapy to treat chronic noncancer pain was based on safety assumptions that subsequent experience has called into question. In 1996, the American Pain Society and the American Academy of Pain Medicine issued a consensus statement supporting long-term opioid therapy. This statement acknowledged the dangers of opioid prescribing but concluded that the risk for addiction was low; respiratory depression induced by opioids was short-lived, occurred mainly in opioid-naive patients, and was antagonized by pain; tolerance was not a common problem; and efforts to control diversion should not constrain opioid prescribing. This has now proven to be wrong. Experience regarding the risks for opioid addiction, misuse, and overdose in community practice has failed to support these assumptions.  According to the Centers for Disease Control and Prevention, fatal overdoses involving opioid analgesics have increased sharply over the past decade. Currently, more than 96,700 people die from drug overdoses every year. Opioids are a factor in 7 out of every 10 overdose deaths. Deaths from drug overdose have surpassed motor vehicle accidents as the leading cause of death for individuals between the ages of 16 and 41.  Clinical data suggest that neuroendocrine dysfunction may be very common in both men and women, potentially causing hypogonadism, erectile dysfunction, infertility, decreased libido, osteoporosis, and depression. Recent studies linked  higher opioid dose to increased opioid-related mortality. Controlled observational studies reported that long-term opioid therapy may be associated with increased risk for cardiovascular events. Subsequent meta-analysis concluded that the safety of long-term opioid therapy in elderly patients has not been proven.   Side Effects and adverse reactions: Common side effects: Drowsiness (sedation). Dizziness. Nausea and vomiting. Constipation. Physical dependence -- Dependence often manifests with  withdrawal symptoms when opioids are discontinued or decreased. Tolerance -- As you take repeated doses of opioids, you require increased medication to experience the same effect of pain relief. Respiratory depression -- This can occur in healthy people, especially with higher doses. However, people with COPD, asthma or other lung conditions may be even more susceptible to fatal respiratory impairment.  Uncommon side effects: An increased sensitivity to feeling pain and extreme response to pain (hyperalgesia). Chronic use of opioids can lead to this. Delayed gastric emptying (the process by which the contents of your stomach are moved into your small intestine). Muscle rigidity. Immune system and hormonal dysfunction. Quick, involuntary muscle jerks (myoclonus). Arrhythmia. Itchy skin (pruritus). Dry mouth (xerostomia).  Long-term side effects: Chronic constipation. Sleep-disordered breathing (SDB). Increased risk of bone fractures. Hypothalamic-pituitary-adrenal dysregulation. Increased risk of overdose.  RISKS: Respiratory depression and death: Opioids increase the risk of respiratory depression and death.  Drug-to-drug interactions: Opioids are relatively contraindicated in combination with benzodiazepines, sleep inducers, and other central nervous system depressants. Other classes of medications (i.e.: certain antibiotics and even over-the-counter medications) may also trigger or induce  respiratory depression in some patients.  Medical conditions: Patients with pre-existing respiratory problems are at higher risk of respiratory failure and/or depression when in combination with opioid analgesics. Opioids are relatively contraindicated in some medical conditions such as central sleep apnea.   Fractures and Falls:  Opioids increase the risk and incidence of falls. This is of particular importance in elderly patients.  Endocrine System:  Long-term administration is associated with endocrine abnormalities (endocrinopathies). (Also known as Opioid-induced Endocrinopathy) Influences on both the hypothalamic-pituitary-adrenal axis?and the hypothalamic-pituitary-gonadal axis have been demonstrated with consequent hypogonadism and adrenal insufficiency in both sexes. Hypogonadism and decreased levels of dehydroepiandrosterone sulfate have been reported in men and women. Endocrine effects include: Amenorrhoea in women (abnormal absence of menstruation) Reduced libido in both sexes Decreased sexual function Erectile dysfunction in men Hypogonadisms (decreased testicular function with shrinkage of testicles) Infertility Depression and fatigue Loss of muscle mass Anxiety Depression Immune suppression Hyperalgesia Weight gain Anemia Osteoporosis Patients (particularly women of childbearing age) should avoid opioids. There is insufficient evidence to recommend routine monitoring of asymptomatic patients taking opioids in the long-term for hormonal deficiencies.  Immune System: Human studies have demonstrated that opioids have an immunomodulating effect. These effects are mediated via opioid receptors both on immune effector cells and in the central nervous system. Opioids have been demonstrated to have adverse effects on antimicrobial response and anti-tumour surveillance. Buprenorphine has been demonstrated to have no impact on immune function.  Opioid Induced  Hyperalgesia: Human studies have demonstrated that prolonged use of opioids can lead to a state of abnormal pain sensitivity, sometimes called opioid induced hyperalgesia (OIH). Opioid induced hyperalgesia is not usually seen in the absence of tolerance to opioid analgesia. Clinically, hyperalgesia may be diagnosed if the patient on long-term opioid therapy presents with increased pain. This might be qualitatively and anatomically distinct from pain related to disease progression or to breakthrough pain resulting from development of opioid tolerance. Pain associated with hyperalgesia tends to be more diffuse than the pre-existing pain and less defined in quality. Management of opioid induced hyperalgesia requires opioid dose reduction.  Cancer: Chronic opioid therapy has been associated with an increased risk of cancer among noncancer patients with chronic pain. This association was more evident in chronic strong opioid users. Chronic opioid consumption causes significant pathological changes in the small intestine and colon. Epidemiological studies have found that there is a link  between opium  dependence and initiation of gastrointestinal cancers. Cancer is the second leading cause of death after cardiovascular disease. Chronic use of opioids can cause multiple conditions such as GERD, immunosuppression and renal damage as well as carcinogenic effects, which are associated with the incidence of cancers.   Mortality: Long-term opioid use has been associated with increased mortality among patients with chronic non-cancer pain (CNCP).  Prescription of long-acting opioids for chronic noncancer pain was associated with a significantly increased risk of all-cause mortality, including deaths from causes other than overdose.  Reference: Von Korff M, Kolodny A, Deyo RA, Chou R. Long-term opioid therapy reconsidered. Ann Intern Med. 2011 Sep 6;155(5):325-8. doi: 10.7326/[id number]. PMID:  78106373; PMCID: EFR6719914. Kit JINNY Laurence CINDERELLA Pearley JINNY, Hayward RA, Dunn KM, Jordan KP. Risk of adverse events in patients prescribed long-term opioids: A cohort study in the [place] Clinical Practice Research D[employer]. Eur J Pain. 2019 May;23(5):908-922. doi: 10.1002/ejp.1357. Epub 2019 Jan 31. PMID: 69379883. Colameco S, Coren JS, Ciervo CA. Continuous opioid treatment for chronic noncancer pain: a time for moderation in prescribing. Postgrad Med. 2009 Jul;121(4):61-6. doi: 10.3810/pgm.2009.07.2032. PMID: 80358728. Gigi JONELLE Shlomo MILUS Levern IVER Conny RN, [place], Blazina I, Lonell DASEN, Bougatsos C, Deyo RA. The effectiveness and risks of long-term opioid therapy for chronic pain: a systematic review for a [employer] of [employer]ways to [employer]. Ann Intern Med. 2015 Feb 17;162(4):276-86. doi: 10.7326/M14-2559. PMID: 74418742. Rory CHRISTELLA Laurence [facility], [place] DM. NCHS Data Brief No. 22. [place]: Centers for Disease Control and Prevention; 2009. Sep, Increase in Fatal Poisonings Involving Opioid Analgesics in the [place] , 1999-2006. Song IA, Choi HR, Oh TK. Long-term opioid use and mortality in patients with chronic non-cancer pain: Ten-year follow-up study in [place] from 2010 through 2019. EClinicalMedicine. 2022 Jul 18;51:101558. doi: 10.1016/j.eclinm.2022.898441. PMID: 64124182; PMCID: EFR0695089. Huser, W., Schubert, T., Vogelmann, T. et al. All-cause mortality in patients with long-term opioid therapy compared with non-opioid analgesics for chronic non-cancer pain: a database study. BMC Med 18, 162 (2020). http://lester.info/ Rashidian H, Zendehdel K, Kamangar F, Malekzadeh R, Haghdoost AA. An Ecological Study of the Association between Opiate Use and Incidence of Cancers. Addict Health. 2016 Fall;8(4):252-260. PMID: 71180443; PMCID: EFR4445194.  Our Goal: Our goal is to control your pain with means other than the use of opioid pain medications.  Our  Recommendation: Talk to your physician about coming off of these medications. We can assist you with the tapering down and stopping these medicines. Based on the new information, even if you cannot completely stop the medication, a decrease in the dose may be associated with a lesser risk. Ask for other means of controlling the pain. Decrease or eliminate those factors that significantly contribute to your pain such as smoking, obesity, and a diet heavily tilted towards inflammatory nutrients.  Last Updated: 05/13/2023   ______________________________________________________________________       ______________________________________________________________________    National Pain Medication Shortage  The [place] is experiencing worsening drug shortages. These have had a negative widespread effect on patient care and treatment. Not expected to improve any time soon. Predicted to last past 2029.   Drug shortage list (generic names) Oxycodone  IR Oxycodone /APAP Oxymorphone IR Hydromorphone Hydrocodone /APAP Morphine   Where is the problem?  Manufacturing and supply level.  Will this shortage affect you?  Only if you take any of the above pain medications.  How? You may be unable to fill your prescription.  Your pharmacist may offer a partial fill of your prescription. (Warning: Do not accept partial fills.)  Prescriptions partially filled cannot be transferred to another pharmacy. Read our Medication Rules and Regulation. Depending on how much medicine you are dependent on, you may experience withdrawals when unable to get the medication.  Recommendations: Consider ending your dependence on opioid pain medications. Ask your pain specialist to assist you with the process. Consider switching to a medication currently not in shortage, such as Buprenorphine. Talk to your pain specialist about this option. Consider decreasing your pain medication requirements by managing tolerance thru  Drug Holidays. This may help minimize withdrawals, should you run out of medicine. Control your pain thru the use of non-pharmacological interventional therapies.   Your prescriber: Prescribers cannot be blamed for shortages. Medication manufacturing and supply issues cannot be fixed by the prescriber.   NOTE: The prescriber is not responsible for supplying the medication, or solving supply issues. Work with your pharmacist to solve it. The patient is responsible for the decision to take or continue taking the medication and for identifying and securing a legal supply source. By law, supplying the medication is the job and responsibility of the pharmacy. The prescriber is responsible for the evaluation, monitoring, and prescribing of these medications.   Prescribers will NOT: Re-issue prescriptions that have been partially filled. Re-issue prescriptions already sent to a pharmacy.  Re-send prescriptions to a different pharmacy because yours did not have your medication. Ask pharmacist to order more medicine or transfer the prescription to another pharmacy. (Read below.)  New 2023 regulation: July 06, 2022 Revised Regulation Allows DEA-Registered Pharmacies to Transfer Electronic Prescriptions at a Patient's Request DEA Headquarters Division - Public Information Office Patients now have the ability to request their electronic prescription be transferred to another pharmacy without having to go back to their practitioner to initiate the request. This revised regulation went into effect on Monday, July 02, 2022.     At a patient's request, a DEA-registered retail pharmacy can now transfer an electronic prescription for a controlled substance (schedules II-V) to another DEA-registered retail pharmacy. Prior to this change, patients would have to go through their practitioner to cancel their prescription and have it re-issued to a different pharmacy. The process was taxing and time consuming  for both patients and practitioners.    The Drug Enforcement Administration [facility]) published its intent to revise the process for transferring electronic prescriptions on September 23, 2020.  The final rule was published in the federal register on May 31, 2022 and went into effect 30 days later.  Under the final rule, a prescription can only be transferred once between pharmacies, and only if allowed under existing state or other applicable law. The prescription must remain in its electronic form; may not be altered in any way; and the transfer must be communicated directly between two licensed pharmacists. It's important to note, any authorized refills transfer with the original prescription, which means the entire prescription will be filled at the same pharmacy.  Reference: hugehand.is [facility] website announcement)  Cheapwipes.[email or web address].pdf [employer])   [employer] / Vol. 88, No. 143 / Thursday, May 31, 2022 / Rules and Regulations DEPARTMENT OF JUSTICE  Drug Enforcement Administration  21 CFR Part 1306  [Docket No. [id number]]  RIN [id number] Transfer of Electronic Prescriptions for Schedules II-V Controlled Substances Between Pharmacies for Initial Filling  ______________________________________________________________________       ______________________________________________________________________    Transfer of Pain Medication between Pharmacies  Re: 2023 DEA Clarification on existing regulation  Published on DEA Website: July 06, 2022  Title:  Revised Regulation Allows DEA-Registered Pharmacies to Transfer Electronic Prescriptions at a Patient's Request DEA Headquarters Division - Public Information Office  Patients now have the ability to request their electronic prescription be transferred to  another pharmacy without having to go back to their practitioner to initiate the request. This revised regulation went into effect on Monday, July 02, 2022.     At a patient's request, a DEA-registered retail pharmacy can now transfer an electronic prescription for a controlled substance (schedules II-V) to another DEA-registered retail pharmacy. Prior to this change, patients would have to go through their practitioner to cancel their prescription and have it re-issued to a different pharmacy. The process was taxing and time consuming for both patients and practitioners.    The Drug Enforcement Administration [facility]) published its intent to revise the process for transferring electronic prescriptions on September 23, 2020.  The final rule was published in the federal register on May 31, 2022 and went into effect 30 days later.  Under the final rule, a prescription can only be transferred once between pharmacies, and only if allowed under existing state or other applicable law. The prescription must remain in its electronic form; may not be altered in any way; and the transfer must be communicated directly between two licensed pharmacists. It's important to note, any authorized refills transfer with the original prescription, which means the entire prescription will be filled at the same pharmacy.    REFERENCES: 1. DEA website announcement hugehand.is  2. Department of Justice website  Cheapwipes.at.pdf  3. DEPARTMENT OF JUSTICE Drug Enforcement Administration 21 CFR Part 1306 [Docket No. [id number]] RIN [id number] Transfer of Electronic Prescriptions for Schedules II-V Controlled Substances Between Pharmacies for Initial Filling  ______________________________________________________________________        ______________________________________________________________________    Medication Rules  Purpose: To inform patients, and their family members, of our medication rules and regulations.  Applies to: All patients receiving prescriptions from our practice (written or electronic).  Pharmacy of record: This is the pharmacy where your electronic prescriptions will be sent. Make sure we have the correct one.  Electronic prescriptions: In compliance with the [place]  Strengthen Opioid Misuse Prevention (STOP) Act of 2017 (Session Law 2017-74/H243), effective November 05, 2018, all controlled substances must be electronically prescribed. Written prescriptions, faxing, or calling prescriptions to a pharmacy will no longer be done.  Prescription refills: These will be provided only during in-person appointments. No medications will be renewed without a face-to-face evaluation with your provider. Applies to all prescriptions.  NOTE: The following applies primarily to controlled substances (Opioid* Pain Medications).   Type of encounter (visit): For patients receiving controlled substances, face-to-face visits are required. (Not an option and not up to the patient.)  Patient's Responsibilities: Pain Pills: Bring all pain pills to every appointment (except for procedure appointments). Pill counts are required.  Pill Bottles: Bring pills in original pharmacy bottle. Bring bottle, even if empty. Always bring the bottle of the most recent fill.  Medication refills: You are responsible for knowing and keeping track of what medications you are taking and when is it that you will need a refill. The day before your appointment: write a list of all prescriptions that need to be refilled. The day of the appointment: give the list to the admitting nurse. Prescriptions will be written only during appointments. No prescriptions will be written on procedure days. If you forget a medication: it will not be  Called in, Faxed, or electronically sent. You will need to get another  appointment to get these prescribed. No early refills. Do not call asking to have your prescription filled early. Partial  or short prescriptions: Occasionally your pharmacy may not have enough pills to fill your prescription.  NEVER ACCEPT a partial fill or a prescription that is short of the total amount of pills that you were prescribed.  With controlled substances the law allows 72 hours for the pharmacy to complete the prescription.  If the prescription is not completed within 72 hours, the pharmacist will require a new prescription to be written. This means that you will be short on your medicine and we WILL NOT send another prescription to complete your original prescription.  Instead, request the pharmacy to send a carrier to a nearby branch to get enough medication to provide you with your full prescription. Prescription Accuracy: You are responsible for carefully inspecting your prescriptions before leaving our office. Have the discharge nurse carefully go over each prescription with you, before taking them home. Make sure that your name is accurately spelled, that your address is correct. Check the name and dose of your medication to make sure it is accurate. Check the number of pills, and the written instructions to make sure they are clear and accurate. Make sure that you are given enough medication to last until your next medication refill appointment. Taking Medication: Take medication as prescribed. When it comes to controlled substances, taking less pills or less frequently than prescribed is permitted and encouraged. Never take more pills than instructed. Never take the medication more frequently than prescribed.  Inform other Doctors: Always inform, all of your healthcare providers, of all the medications you take. Pain Medication from other Providers: You are not allowed to accept any additional pain medication  from any other Doctor or Healthcare provider. There are two exceptions to this rule. (see below) In the event that you require additional pain medication, you are responsible for notifying us , as stated below. Cough Medicine: Often these contain an opioid, such as codeine or hydrocodone . Never accept or take cough medicine containing these opioids if you are already taking an opioid* medication. The combination may cause respiratory failure and death. Medication Agreement: You are responsible for carefully reading and following our Medication Agreement. This must be signed before receiving any prescriptions from our practice. Safely store a copy of your signed Agreement. Violations to the Agreement will result in no further prescriptions. (Additional copies of our Medication Agreement are available upon request.) Laws, Rules, & Regulations: All patients are expected to follow all [address] and [employer], Rules, [place] [employer]. Ignorance of the Laws does not constitute a valid excuse.  Illegal drugs and Controlled Substances: The use of illegal substances (including, but not limited to marijuana and its derivatives) and/or the illegal use of any controlled substances is strictly prohibited. Violation of this rule may result in the immediate and permanent discontinuation of any and all prescriptions being written by our practice. The use of any illegal substances is prohibited. Adopted CDC guidelines & recommendations: Target dosing levels will be at or below 60 MME/day. Use of benzodiazepines** is not recommended. Urine Drug testing: Patients taking controlled substances will be required to provide a urine sample upon request. Do not void before coming to your medication management appointments. Hold emptying your bladder until you are admitted. The admitting nurse will inform you if a sample is required. Our practice reserves the right to call you at any time to provide a sample. Once receiving the  call, you  have 24 hours to comply with request. Not providing a sample upon request may result in termination of medication therapy.  Exceptions: There are only two exceptions to the rule of not receiving pain medications from other Healthcare Providers. Exception #1 (Emergencies): In the event of an emergency (i.e.: accident requiring emergency care), you are allowed to receive additional pain medication. However, you are responsible for: As soon as you are able, call our office [phone], at any time of the day or night, and leave a message stating your name, the date and nature of the emergency, and the name and dose of the medication prescribed. In the event that your call is answered by a member of our staff, make sure to document and save the date, time, and the name of the person that took your information.  Exception #2 (Planned Surgery): In the event that you are scheduled by another doctor or dentist to have any type of surgery or procedure, you are allowed (for a period no longer than 30 days), to receive additional pain medication, for the acute post-op pain. However, in this case, you are responsible for picking up a copy of our Post-op Pain Management for Surgeons handout, and giving it to your surgeon or dentist. This document is available at our office, and does not require an appointment to obtain it. Simply go to our office during business hours (Monday-Thursday from 8:00 AM to 4:00 PM) (Friday 8:00 AM to 12:00 Noon) or if you have a scheduled appointment with us , prior to your surgery, and ask for it by name. In addition, you are responsible for: calling our office [phone], at any time of the day or night, and leaving a message stating your name, name of your surgeon, type of surgery, and date of procedure or surgery. Failure to comply with your responsibilities may result in termination of therapy involving the controlled substances.  Consequences:  Non-compliance with the  above rules may result in permanent discontinuation of medication prescription therapy. All patients receiving any type of controlled substance is expected to comply with the above patient responsibilities. Not doing so may result in permanent discontinuation of medication prescription therapy. Medication Agreement Violation. Following the above rules, including your responsibilities will help you in avoiding a Medication Agreement Violation ("Breaking your Pain Medication Contract").  *Opioid medications include: morphine , codeine, oxycodone , oxymorphone, hydrocodone , hydromorphone, meperidine, tramadol , tapentadol, buprenorphine, fentanyl , methadone. **Benzodiazepine medications include: diazepam  (Valium ), alprazolam (Xanax), clonazepam (Klonopine), lorazepam (Ativan), clorazepate (Tranxene), chlordiazepoxide (Librium), estazolam (Prosom), oxazepam (Serax), temazepam (Restoril), triazolam (Halcion) (Last updated: 08/28/2023) ______________________________________________________________________      ______________________________________________________________________    Medication Recommendations and Reminders  Applies to: All patients receiving prescriptions (written and/or electronic).  Medication Rules & Regulations: You are responsible for reading, knowing, and following our Medication Rules document. These exist for your safety and that of others. They are not flexible and neither are we. Dismissing or ignoring them is an act of non-compliance that may result in complete and irreversible termination of such medication therapy. For safety reasons, non-compliance will not be tolerated. As with the [place] fundamental legal principle of ignorance of the law is no defense, we will accept no excuses for not having read and knowing the content of documents provided to you by our practice.  Pharmacy of record:  Definition: This is the pharmacy where your electronic prescriptions will be  sent.  We do not endorse any particular pharmacy. It is up to you and your insurance to decide what pharmacy  to use.  We do not restrict you in your choice of pharmacy. However, once we write for your prescriptions, we will NOT be re-sending more prescriptions to fix restricted supply problems created by your pharmacy, or your insurance.  The pharmacy listed in the electronic medical record should be the one where you want electronic prescriptions to be sent. If you choose to change pharmacy, simply notify our nursing staff. Changes will be made only during your regular appointments and not over the phone.  Recommendations: Keep all of your pain medications in a safe place, under lock and key, even if you live alone. We will NOT replace lost, stolen, or damaged medication. We do not accept Police Reports as proof of medications having been stolen. After you fill your prescription, take 1 week's worth of pills and put them away in a safe place. You should keep a separate, properly labeled bottle for this purpose. The remainder should be kept in the original bottle. Use this as your primary supply, until it runs out. Once it's gone, then you know that you have 1 week's worth of medicine, and it is time to come in for a prescription refill. If you do this correctly, it is unlikely that you will ever run out of medicine. To make sure that the above recommendation works, it is very important that you make sure your medication refill appointments are scheduled at least 1 week before you run out of medicine. To do this in an effective manner, make sure that you do not leave the office without scheduling your next medication management appointment. Always ask the nursing staff to show you in your prescription , when your medication will be running out. Then arrange for the receptionist to get you a return appointment, at least 7 days before you run out of medicine. Do not wait until you have 1 or 2 pills left, to  come in. This is very poor planning and does not take into consideration that we may need to cancel appointments due to bad weather, sickness, or emergencies affecting our staff. DO NOT ACCEPT A Partial Fill: If for any reason your pharmacy does not have enough pills/tablets to completely fill or refill your prescription, do not allow for a partial fill. The law allows the pharmacy to complete that prescription within 72 hours, without requiring a new prescription. If they do not fill the rest of your prescription within those 72 hours, you will need a separate prescription to fill the remaining amount, which we will NOT provide. If the reason for the partial fill is your insurance, you will need to talk to the pharmacist about payment alternatives for the remaining tablets, but again, DO NOT ACCEPT A PARTIAL FILL, unless you can trust your pharmacist to obtain the remainder of the pills within 72 hours.  Prescription refills and/or changes in medication(s):  Prescription refills, and/or changes in dose or medication, will be conducted only during scheduled medication management appointments. (Applies to both, written and electronic prescriptions.) No refills on procedure days. No medication will be changed or started on procedure days. No changes, adjustments, and/or refills will be conducted on a procedure day. Doing so will interfere with the diagnostic portion of the procedure. No phone refills. No medications will be called into the pharmacy. No Fax refills. No weekend refills. No Holliday refills. No after hours refills.  Remember:  Business hours are:  Monday to Thursday 8:00 AM to 4:00 PM Provider's Schedule: Eric Como, MD - Appointments are:  Medication management: Monday and Wednesday 8:00 AM to 4:00 PM Procedure day: Tuesday and Thursday 7:30 AM to 4:00 PM Wallie Sherry, MD - Appointments are:  Medication management: Tuesday and Thursday 8:00 AM to 4:00 PM Procedure day:  Monday and Wednesday 7:30 AM to 4:00 PM (Last update: 08/28/2022) ______________________________________________________________________      ______________________________________________________________________     Naloxone  Nasal Spray  Why am I receiving this medication? [place]  STOP ACT requires that all patients taking high dose opioids or at risk of opioids respiratory depression, be prescribed an opioid reversal agent, such as Naloxone  (AKA: Narcan ).  What is this medication? NALOXONE  (nal OX one) treats opioid overdose, which causes slow or shallow breathing, severe drowsiness, or trouble staying awake. Call emergency services after using this medication. You may need additional treatment. Naloxone  works by reversing the effects of opioids. It belongs to a group of medications called opioid blockers.  COMMON BRAND NAME(S): Kloxxado , Narcan   What should I tell my care team before I take this medication? They need to know if you have any of these conditions: Heart disease Substance use disorder An unusual or allergic reaction to naloxone , other medications, foods, dyes, or preservatives Pregnant or trying to get pregnant Breast-feeding  When to use this medication? This medication is to be used for the treatment of respiratory depression (less than 8 breaths per minute) secondary to opioid overdose.   How to use this medication? This medication is for use in the nose. Lay the person on their back. Support their neck with your hand and allow the head to tilt back before giving the medication. The nasal spray should be given into 1 nostril. After giving the medication, move the person onto their side. Do not remove or test the nasal spray until ready to use. Get emergency medical help right away after giving the first dose of this medication, even if the person wakes up. You should be familiar with how to recognize the signs and symptoms of a narcotic overdose. If more doses  are needed, give the additional dose in the other nostril. Talk to your care team about the use of this medication in children. While this medication may be prescribed for children as young as newborns for selected conditions, precautions do apply.  Naloxone  Overdosage: If you think you have taken too much of this medicine contact a poison control center or emergency room at once.  NOTE: This medicine is only for you. Do not share this medicine with others.  What if I miss a dose? This does not apply.  What may interact with this medication? This is only used during an emergency. No interactions are expected during emergency use. This list may not describe all possible interactions. Give your health care provider a list of all the medicines, herbs, non-prescription drugs, or dietary supplements you use. Also tell them if you smoke, drink alcohol, or use illegal drugs. Some items may interact with your medicine.  What should I watch for while using this medication? Keep this medication ready for use in the case of an opioid overdose. Make sure that you have the phone number of your care team and local hospital ready. You may need to have additional doses of this medication. Each nasal spray contains a single dose. Some emergencies may require additional doses. After use, bring the treated person to the nearest hospital or call 911. Make sure the treating care team knows that the person has received a dose of this medication. You will  receive additional instructions on what to do during and after use of this medication before an emergency occurs.  What side effects may I notice from receiving this medication? Side effects that you should report to your care team as soon as possible: Allergic reactions--skin rash, itching, hives, swelling of the face, lips, tongue, or throat Side effects that usually do not require medical attention (report these to your care team if they continue or are  bothersome): Constipation Dryness or irritation inside the nose Headache Increase in blood pressure Muscle spasms Stuffy nose Toothache This list may not describe all possible side effects. Call your doctor for medical advice about side effects. You may report side effects to FDA at 1-800-FDA-1088.  Where should I keep my medication? Because this is an emergency medication, you should keep it with you at all times.  Keep out of the reach of children and pets. Store between 20 and 25 degrees C (68 and 77 degrees F). Do not freeze. Throw away any unused medication after the expiration date. Keep in original box until ready to use.  NOTE: This sheet is a summary. It may not cover all possible information. If you have questions about this medicine, talk to your doctor, pharmacist, or health care provider.   2023 [employer] Standard (2021-06-30 00:00:00)  ______________________________________________________________________

## 2023-11-17 NOTE — Progress Notes (Signed)
(  11/18/2023) NO-SHOW to medication management encounter.

## 2023-11-18 ENCOUNTER — Ambulatory Visit (HOSPITAL_BASED_OUTPATIENT_CLINIC_OR_DEPARTMENT_OTHER): Payer: MEDICAID | Admitting: Pain Medicine

## 2023-11-18 DIAGNOSIS — G8929 Other chronic pain: Secondary | ICD-10-CM

## 2023-11-18 DIAGNOSIS — M47816 Spondylosis without myelopathy or radiculopathy, lumbar region: Secondary | ICD-10-CM

## 2023-11-18 DIAGNOSIS — G35 Multiple sclerosis: Secondary | ICD-10-CM

## 2023-11-18 DIAGNOSIS — Z91199 Patient's noncompliance with other medical treatment and regimen due to unspecified reason: Secondary | ICD-10-CM

## 2023-11-18 DIAGNOSIS — Z79891 Long term (current) use of opiate analgesic: Secondary | ICD-10-CM

## 2023-11-18 DIAGNOSIS — G894 Chronic pain syndrome: Secondary | ICD-10-CM

## 2023-11-18 DIAGNOSIS — Z79899 Other long term (current) drug therapy: Secondary | ICD-10-CM

## 2023-11-19 ENCOUNTER — Encounter: Payer: Self-pay | Admitting: Pain Medicine

## 2023-11-19 ENCOUNTER — Ambulatory Visit: Payer: MEDICAID | Attending: Pain Medicine | Admitting: Pain Medicine

## 2023-11-19 VITALS — BP 101/70 | HR 72 | Temp 97.3°F | Resp 16 | Ht 60.0 in | Wt 230.0 lb

## 2023-11-19 DIAGNOSIS — M5459 Other low back pain: Secondary | ICD-10-CM | POA: Insufficient documentation

## 2023-11-19 DIAGNOSIS — M25562 Pain in left knee: Secondary | ICD-10-CM | POA: Diagnosis present

## 2023-11-19 DIAGNOSIS — M25561 Pain in right knee: Secondary | ICD-10-CM | POA: Diagnosis present

## 2023-11-19 DIAGNOSIS — M79605 Pain in left leg: Secondary | ICD-10-CM | POA: Diagnosis present

## 2023-11-19 DIAGNOSIS — Z79891 Long term (current) use of opiate analgesic: Secondary | ICD-10-CM | POA: Diagnosis present

## 2023-11-19 DIAGNOSIS — M5442 Lumbago with sciatica, left side: Secondary | ICD-10-CM | POA: Diagnosis present

## 2023-11-19 DIAGNOSIS — M533 Sacrococcygeal disorders, not elsewhere classified: Secondary | ICD-10-CM | POA: Insufficient documentation

## 2023-11-19 DIAGNOSIS — M47816 Spondylosis without myelopathy or radiculopathy, lumbar region: Secondary | ICD-10-CM | POA: Insufficient documentation

## 2023-11-19 DIAGNOSIS — M79604 Pain in right leg: Secondary | ICD-10-CM | POA: Insufficient documentation

## 2023-11-19 DIAGNOSIS — G35 Multiple sclerosis: Secondary | ICD-10-CM | POA: Diagnosis not present

## 2023-11-19 DIAGNOSIS — G8929 Other chronic pain: Secondary | ICD-10-CM | POA: Diagnosis present

## 2023-11-19 DIAGNOSIS — M25551 Pain in right hip: Secondary | ICD-10-CM | POA: Diagnosis present

## 2023-11-19 DIAGNOSIS — M25552 Pain in left hip: Secondary | ICD-10-CM | POA: Insufficient documentation

## 2023-11-19 DIAGNOSIS — Z79899 Other long term (current) drug therapy: Secondary | ICD-10-CM | POA: Insufficient documentation

## 2023-11-19 DIAGNOSIS — M5441 Lumbago with sciatica, right side: Secondary | ICD-10-CM | POA: Diagnosis not present

## 2023-11-19 DIAGNOSIS — G894 Chronic pain syndrome: Secondary | ICD-10-CM | POA: Insufficient documentation

## 2023-11-19 MED ORDER — OXYCODONE HCL 5 MG PO TABS: 5.0000 mg | ORAL_TABLET | Freq: Four times a day (QID) | ORAL | 0 refills | Status: DC | PRN

## 2023-11-19 NOTE — Patient Instructions (Addendum)
 Facet Joint Block The facet joints connect the bones of the spine (vertebrae). They let you bend, twist, and make other movements with your spine. They also keep you from bending too far, twisting too far, and making other extreme movements. A facet joint block is a procedure where a numbing medicine (local anesthesia) is injected into a facet joint. Many times, a medicine for inflammation (steroid) is also injected. A facet joint block may be done: To diagnose neck or back pain. If the pain gets better after a facet joint block, the pain is likely coming from the facet joint. If the pain does not get better, the pain is likely not coming from the facet joint. To treat neck or back pain caused by an inflamed facet joint. To help you with physical therapy or other rehab (rehabilitation) exercises. Tell a health care provider about: Any allergies you have. All medicines you are taking, including vitamins, herbs, eye drops, creams, and over-the-counter medicines. Any problems you or family members have had with anesthesia. Any bleeding problems you have. Any surgeries you have had. Any medical conditions you have or have had. Whether you are pregnant or may be pregnant. What are the risks? Your health care provider will talk with you about risks. These may include: Infection. Allergic reactions to medicines or dyes. Bleeding. Injury to a nerve near where the needle was put in (injection site). Pain at the injection site. Short-term weakness or numbness in areas near the nerves at the injection site. What happens before the procedure? When to stop eating and drinking Follow instructions from your health care provider about what you may eat and drink. Medicines Ask your health care provider about: Changing or stopping your regular medicines. These include any diabetes medicines or blood thinners you take. Taking medicines such as aspirin and ibuprofen. These medicines can thin your blood. Do  not take these medicines unless your health care provider tells you to. Taking over-the-counter medicines, vitamins, herbs, and supplements. General instructions If you will be going home right after the procedure, plan to have a responsible adult: Take you home from the hospital or clinic. You will not be allowed to drive. Care for you for the time you are told. Ask your health care provider: How your injection site will be marked. What steps will be taken to help prevent infection. These may include washing skin with a soap that kills germs. What happens during the procedure?  An IV will be inserted into one of your veins. You will lie on your stomach on an X-ray table. You may be asked to lie in a different position if you will be getting an injection in your neck. Your injection site will be cleaned with a soap that kills germs and then covered with a germ-free (sterile) drape. A local anesthesia will be put in at the injection site. A type of X-ray machine (fluoroscopy) or CT scan will be used to help find your facet joint. A contrast dye may also be injected into your joint to help show if the needle is at the joint. When your provider knows the needle is at your joint, they will inject anesthesia and anti-inflammatory medicine as needed. The needle will be removed. Pressure will be applied to keep your injection site from bleeding. A bandage (dressing) will be placed over each injection site. The procedure may vary among health care providers and hospitals. What happens after the procedure? Your blood pressure, heart rate, breathing rate, and blood oxygen level  will be monitored until you leave the hospital or clinic. This information is not intended to replace advice given to you by your health care provider. Make sure you discuss any questions you have with your health care provider. Document Revised: 05/04/2022 Document Reviewed: 05/04/2022 Elsevier Patient Education  2024  [employer]  ______________________________________________________________________    Procedure instructions  Stop blood-thinners  Do not eat or drink fluids (other than water) for 6 hours before your procedure  No water for 2 hours before your procedure  Take your blood pressure medicine with a sip of water  Arrive 30 minutes before your appointment  If sedation is planned, bring suitable driver. [place], [place], & public transportation are NOT APPROVED)  Carefully read the Preparing for your procedure detailed instructions  If you have questions call us  at [phone]  Procedure appointments are for procedures only. NO medication refills or new problem evaluations.   ______________________________________________________________________      ______________________________________________________________________    Preparing for your procedure  Appointments: If you think you may not be able to keep your appointment, call 24-48 hours in advance to cancel. We need time to make it available to others.  Procedure visits are for procedures only. During your procedure appointment there will be: NO Prescription Refills*. NO medication changes or discussions*. NO discussion of disability issues*. NO unrelated pain problem evaluations*. NO evaluations to order other pain procedures*. *These will be addressed at a separate and distinct evaluation encounter on the provider's evaluation schedule and not during procedure days.  Instructions: Food intake: Avoid eating anything solid for at least 8 hours prior to your procedure. Clear liquid intake: You may take clear liquids such as water up to 2 hours prior to your procedure. (No carbonated drinks. No soda.) Transportation: Unless otherwise stated by your physician, bring a driver. (Driver cannot be a Market Researcher, Pharmacist, Community, or any other form of public transportation.) Morning Medicines: Except for blood thinners, take all of your other  morning medications with a sip of water. Make sure to take your heart and blood pressure medicines. If your blood pressure's lower number is above 100, the case will be rescheduled. Blood thinners: Make sure to stop your blood thinners as instructed.  If you take a blood thinner, but were not instructed to stop it, call our office [phone] and ask to talk to a nurse. Not stopping a blood thinner prior to certain procedures could lead to serious complications. Diabetics on insulin : Notify the staff so that you can be scheduled 1st case in the morning. If your diabetes requires high dose insulin , take only  of your normal insulin  dose the morning of the procedure and notify the staff that you have done so. Preventing infections: Shower with an antibacterial soap the morning of your procedure.  Build-up your immune system: Take 1000 mg of Vitamin C with every meal (3 times a day) the day prior to your procedure. Antibiotics: Inform the nursing staff if you are taking any antibiotics or if you have any conditions that may require antibiotics prior to procedures. (Example: recent joint implants)   Pregnancy: If you are pregnant make sure to notify the nursing staff. Not doing so may result in injury to the fetus, including death.  Sickness: If you have a cold, fever, or any active infections, call and cancel or reschedule your procedure. Receiving steroids while having an infection may result in complications. Arrival: You must be in the facility at least 30 minutes prior to your scheduled  procedure. Tardiness: Your scheduled time is also the cutoff time. If you do not arrive at least 15 minutes prior to your procedure, you will be rescheduled.  Children: Do not bring any children with you. Make arrangements to keep them home. Dress appropriately: There is always a possibility that your clothing may get soiled. Avoid long dresses. Valuables: Do not bring any jewelry or valuables.  Reasons to call and  reschedule or cancel your procedure: (Following these recommendations will minimize the risk of a serious complication.) Surgeries: Avoid having procedures within 2 weeks of any surgery. (Avoid for 2 weeks before or after any surgery). Flu Shots: Avoid having procedures within 2 weeks of a flu shots or . (Avoid for 2 weeks before or after immunizations). Barium: Avoid having a procedure within 7-10 days after having had a radiological study involving the use of radiological contrast. (Myelograms, Barium swallow or enema study). Heart attacks: Avoid any elective procedures or surgeries for the initial 6 months after a Myocardial Infarction (Heart Attack). Blood thinners: It is imperative that you stop these medications before procedures. Let us  know if you if you take any blood thinner.  Infection: Avoid procedures during or within two weeks of an infection (including chest colds or gastrointestinal problems). Symptoms associated with infections include: Localized redness, fever, chills, night sweats or profuse sweating, burning sensation when voiding, cough, congestion, stuffiness, runny nose, sore throat, diarrhea, nausea, vomiting, cold or Flu symptoms, recent or current infections. It is specially important if the infection is over the area that we intend to treat. Heart and lung problems: Symptoms that may suggest an active cardiopulmonary problem include: cough, chest pain, breathing difficulties or shortness of breath, dizziness, ankle swelling, uncontrolled high or unusually low blood pressure, and/or palpitations. If you are experiencing any of these symptoms, cancel your procedure and contact your primary care physician for an evaluation.  Remember:  Regular Business hours are:  Monday to Thursday 8:00 AM to 4:00 PM  Provider's Schedule: Eric Como, MD:  Procedure days: Tuesday and Thursday 7:30 AM to 4:00 PM  Wallie Sherry, MD:  Procedure days: Monday and Wednesday 7:30 AM to 4:00  PM Last  Updated: 10/15/2023 ______________________________________________________________________      ______________________________________________________________________    General Risks and Possible Complications  Patient Responsibilities: It is important that you read this as it is part of your informed consent. It is our duty to inform you of the risks and possible complications associated with treatments offered to you. It is your responsibility as a patient to read this and to ask questions about anything that is not clear or that you believe was not covered in this document.  Patient's Rights: You have the right to refuse treatment. You also have the right to change your mind, even after initially having agreed to have the treatment done. However, under this last option, if you wait until the last second to change your mind, you may be charged for the materials used up to that point.  Introduction: Medicine is not an visual merchandiser. Everything in Medicine, including the lack of treatment(s), carries the potential for danger, harm, or loss (which is by definition: Risk). In Medicine, a complication is a secondary problem, condition, or disease that can aggravate an already existing one. All treatments carry the risk of possible complications. The fact that a side effects or complications occurs, does not imply that the treatment was conducted incorrectly. It must be clearly understood that these can happen even when everything is done  following the highest safety standards.  No treatment: You can choose not to proceed with the proposed treatment alternative. The "PRO(s)" would include: avoiding the risk of complications associated with the therapy. The "CON(s)" would include: not getting any of the treatment benefits. These benefits fall under one of three categories: diagnostic; therapeutic; and/or palliative. Diagnostic benefits include: getting information which can ultimately lead to  improvement of the disease or symptom(s). Therapeutic benefits are those associated with the successful treatment of the disease. Finally, palliative benefits are those related to the decrease of the primary symptoms, without necessarily curing the condition (example: decreasing the pain from a flare-up of a chronic condition, such as incurable terminal cancer).  General Risks and Complications: These are associated to most interventional treatments. They can occur alone, or in combination. They fall under one of the following six (6) categories: no benefit or worsening of symptoms; bleeding; infection; nerve damage; allergic reactions; and/or death. No benefits or worsening of symptoms: In Medicine there are no guarantees, only probabilities. No healthcare provider can ever guarantee that a medical treatment will work, they can only state the probability that it may. Furthermore, there is always the possibility that the condition may worsen, either directly, or indirectly, as a consequence of the treatment. Bleeding: This is more common if the patient is taking a blood thinner, either prescription or over the counter (example: Goody Powders, Fish oil, Aspirin, Garlic, etc.), or if suffering a condition associated with impaired coagulation (example: Hemophilia, cirrhosis of the liver, low platelet counts, etc.). However, even if you do not have one on these, it can still happen. If you have any of these conditions, or take one of these drugs, make sure to notify your treating physician. Infection: This is more common in patients with a compromised immune system, either due to disease (example: diabetes, cancer, human immunodeficiency virus [HIV], etc.), or due to medications or treatments (example: therapies used to treat cancer and rheumatological diseases). However, even if you do not have one on these, it can still happen. If you have any of these conditions, or take one of these drugs, make sure to notify  your treating physician. Nerve Damage: This is more common when the treatment is an invasive one, but it can also happen with the use of medications, such as those used in the treatment of cancer. The damage can occur to small secondary nerves, or to large primary ones, such as those in the spinal cord and brain. This damage may be temporary or permanent and it may lead to impairments that can range from temporary numbness to permanent paralysis and/or brain death. Allergic Reactions: Any time a substance or material comes in contact with our body, there is the possibility of an allergic reaction. These can range from a mild skin rash (contact dermatitis) to a severe systemic reaction (anaphylactic reaction), which can result in death. Death: In general, any medical intervention can result in death, most of the time due to an unforeseen complication. ______________________________________________________________________      ______________________________________________________________________    Opioid Pain Medication Update  To: All patients taking opioid pain medications. (I.e.: hydrocodone , hydromorphone, oxycodone , oxymorphone, morphine , codeine, methadone, tapentadol, tramadol , buprenorphine, fentanyl , etc.)  Re: Updated review of side effects and adverse reactions of opioid analgesics, as well as new information about long term effects of this class of medications.  Direct risks of long-term opioid therapy are not limited to opioid addiction and overdose. Potential medical risks include serious fractures, breathing problems during sleep,  hyperalgesia, immunosuppression, chronic constipation, bowel obstruction, myocardial infarction, and tooth decay secondary to xerostomia.  Unpredictable adverse effects that can occur even if you take your medication correctly: Cognitive impairment, respiratory depression, and death. Most people think that if they take their medication correctly, and as  instructed, that they will be safe. Nothing could be farther from the truth. In reality, a significant amount of recorded deaths associated with the use of opioids has occurred in individuals that had taken the medication for a long time, and were taking their medication correctly. The following are examples of how this can happen: Patient taking his/her medication for a long time, as instructed, without any side effects, is given a certain antibiotic or another unrelated medication, which in turn triggers a Drug-to-drug interaction leading to disorientation, cognitive impairment, impaired reflexes, respiratory depression or an untoward event leading to serious bodily harm or injury, including death.  Patient taking his/her medication for a long time, as instructed, without any side effects, develops an acute impairment of liver and/or kidney function. This will lead to a rapid inability of the body to breakdown and eliminate their pain medication, which will result in effects similar to an overdose, but with the same medicine and dose that they had always taken. This again may lead to disorientation, cognitive impairment, impaired reflexes, respiratory depression or an untoward event leading to serious bodily harm or injury, including death.  A similar problem will occur with patients as they grow older and their liver and kidney function begins to decrease as part of the aging process.  Background information: Historically, the original case for using long-term opioid therapy to treat chronic noncancer pain was based on safety assumptions that subsequent experience has called into question. In 1996, the American Pain Society and the American Academy of Pain Medicine issued a consensus statement supporting long-term opioid therapy. This statement acknowledged the dangers of opioid prescribing but concluded that the risk for addiction was low; respiratory depression induced by opioids was short-lived,  occurred mainly in opioid-naive patients, and was antagonized by pain; tolerance was not a common problem; and efforts to control diversion should not constrain opioid prescribing. This has now proven to be wrong. Experience regarding the risks for opioid addiction, misuse, and overdose in community practice has failed to support these assumptions.  According to the Centers for Disease Control and Prevention, fatal overdoses involving opioid analgesics have increased sharply over the past decade. Currently, more than 96,700 people die from drug overdoses every year. Opioids are a factor in 7 out of every 10 overdose deaths. Deaths from drug overdose have surpassed motor vehicle accidents as the leading cause of death for individuals between the ages of 62 and 79.  Clinical data suggest that neuroendocrine dysfunction may be very common in both men and women, potentially causing hypogonadism, erectile dysfunction, infertility, decreased libido, osteoporosis, and depression. Recent studies linked higher opioid dose to increased opioid-related mortality. Controlled observational studies reported that long-term opioid therapy may be associated with increased risk for cardiovascular events. Subsequent meta-analysis concluded that the safety of long-term opioid therapy in elderly patients has not been proven.   Side Effects and adverse reactions: Common side effects: Drowsiness (sedation). Dizziness. Nausea and vomiting. Constipation. Physical dependence -- Dependence often manifests with withdrawal symptoms when opioids are discontinued or decreased. Tolerance -- As you take repeated doses of opioids, you require increased medication to experience the same effect of pain relief. Respiratory depression -- This can occur in healthy people, especially with higher  doses. However, people with COPD, asthma or other lung conditions may be even more susceptible to fatal respiratory impairment.  Uncommon side  effects: An increased sensitivity to feeling pain and extreme response to pain (hyperalgesia). Chronic use of opioids can lead to this. Delayed gastric emptying (the process by which the contents of your stomach are moved into your small intestine). Muscle rigidity. Immune system and hormonal dysfunction. Quick, involuntary muscle jerks (myoclonus). Arrhythmia. Itchy skin (pruritus). Dry mouth (xerostomia).  Long-term side effects: Chronic constipation. Sleep-disordered breathing (SDB). Increased risk of bone fractures. Hypothalamic-pituitary-adrenal dysregulation. Increased risk of overdose.  RISKS: Respiratory depression and death: Opioids increase the risk of respiratory depression and death.  Drug-to-drug interactions: Opioids are relatively contraindicated in combination with benzodiazepines, sleep inducers, and other central nervous system depressants. Other classes of medications (i.e.: certain antibiotics and even over-the-counter medications) may also trigger or induce respiratory depression in some patients.  Medical conditions: Patients with pre-existing respiratory problems are at higher risk of respiratory failure and/or depression when in combination with opioid analgesics. Opioids are relatively contraindicated in some medical conditions such as central sleep apnea.   Fractures and Falls:  Opioids increase the risk and incidence of falls. This is of particular importance in elderly patients.  Endocrine System:  Long-term administration is associated with endocrine abnormalities (endocrinopathies). (Also known as Opioid-induced Endocrinopathy) Influences on both the hypothalamic-pituitary-adrenal axis?and the hypothalamic-pituitary-gonadal axis have been demonstrated with consequent hypogonadism and adrenal insufficiency in both sexes. Hypogonadism and decreased levels of dehydroepiandrosterone sulfate have been reported in men and women. Endocrine effects  include: Amenorrhoea in women (abnormal absence of menstruation) Reduced libido in both sexes Decreased sexual function Erectile dysfunction in men Hypogonadisms (decreased testicular function with shrinkage of testicles) Infertility Depression and fatigue Loss of muscle mass Anxiety Depression Immune suppression Hyperalgesia Weight gain Anemia Osteoporosis Patients (particularly women of childbearing age) should avoid opioids. There is insufficient evidence to recommend routine monitoring of asymptomatic patients taking opioids in the long-term for hormonal deficiencies.  Immune System: Human studies have demonstrated that opioids have an immunomodulating effect. These effects are mediated via opioid receptors both on immune effector cells and in the central nervous system. Opioids have been demonstrated to have adverse effects on antimicrobial response and anti-tumour surveillance. Buprenorphine has been demonstrated to have no impact on immune function.  Opioid Induced Hyperalgesia: Human studies have demonstrated that prolonged use of opioids can lead to a state of abnormal pain sensitivity, sometimes called opioid induced hyperalgesia (OIH). Opioid induced hyperalgesia is not usually seen in the absence of tolerance to opioid analgesia. Clinically, hyperalgesia may be diagnosed if the patient on long-term opioid therapy presents with increased pain. This might be qualitatively and anatomically distinct from pain related to disease progression or to breakthrough pain resulting from development of opioid tolerance. Pain associated with hyperalgesia tends to be more diffuse than the pre-existing pain and less defined in quality. Management of opioid induced hyperalgesia requires opioid dose reduction.  Cancer: Chronic opioid therapy has been associated with an increased risk of cancer among noncancer patients with chronic pain. This association was more evident in chronic strong  opioid users. Chronic opioid consumption causes significant pathological changes in the small intestine and colon. Epidemiological studies have found that there is a link between opium  dependence and initiation of gastrointestinal cancers. Cancer is the second leading cause of death after cardiovascular disease. Chronic use of opioids can cause multiple conditions such as GERD, immunosuppression and renal damage as well as carcinogenic effects, which  are associated with the incidence of cancers.   Mortality: Long-term opioid use has been associated with increased mortality among patients with chronic non-cancer pain (CNCP).  Prescription of long-acting opioids for chronic noncancer pain was associated with a significantly increased risk of all-cause mortality, including deaths from causes other than overdose.  Reference: Von Korff M, Kolodny A, Deyo RA, Chou R. Long-term opioid therapy reconsidered. Ann Intern Med. 2011 Sep 6;155(5):325-8. doi: 10.7326/[id number]. PMID: 78106373; PMCID: [id number]. Kit JINNY Laurence CINDERELLA Pearley JINNY, Hayward RA, Dunn KM, Jordan KP. Risk of adverse events in patients prescribed long-term opioids: A cohort study in the [place] Clinical Practice Research Datalink. Eur J Pain. 2019 May;23(5):908-922. doi: 10.1002/ejp.1357. Epub 2019 Jan 31. PMID: 69379883. Colameco S, Coren JS, Ciervo CA. Continuous opioid treatment for chronic noncancer pain: a time for moderation in prescribing. Postgrad Med. 2009 Jul;121(4):61-6. doi: 10.3810/pgm.2009.07.2032. PMID: 80358728. Gigi JONELLE Shlomo MILUS Levern IVER Conny RN, Kim SD, Blazina I, Lonell DASEN, Bougatsos C, Deyo RA. The effectiveness and risks of long-term opioid therapy for chronic pain: a systematic review for a [employer] of [employer] to [employer]. Ann Intern Med. 2015 Feb 17;162(4):276-86. doi: 10.7326/M14-2559. PMID: 74418742. Rory CHRISTELLA Laurence [facility], Makuc DM. NCHS Data Brief No. 22. [place]: Centers  for Disease Control and Prevention; 2009. Sep, Increase in Fatal Poisonings Involving Opioid Analgesics in the [place] , 1999-2006. Song IA, Choi HR, Oh TK. Long-term opioid use and mortality in patients with chronic non-cancer pain: Ten-year follow-up study in [place] from 2010 through 2019. EClinicalMedicine. 2022 Jul 18;51:101558. doi: 10.1016/j.eclinm.2022.898441. PMID: 64124182; PMCID: EFR0695089. Huser, W., Schubert, T., Vogelmann, T. et al. All-cause mortality in patients with long-term opioid therapy compared with non-opioid analgesics for chronic non-cancer pain: a database study. BMC Med 18, 162 (2020). http://lester.info/ Rashidian H, Zendehdel K, Kamangar F, Malekzadeh R, Haghdoost AA. An Ecological Study of the Association between Opiate Use and Incidence of Cancers. Addict Health. 2016 Fall;8(4):252-260. PMID: 71180443; PMCID: EFR4445194.  Our Goal: Our goal is to control your pain with means other than the use of opioid pain medications.  Our Recommendation: Talk to your physician about coming off of these medications. We can assist you with the tapering down and stopping these medicines. Based on the new information, even if you cannot completely stop the medication, a decrease in the dose may be associated with a lesser risk. Ask for other means of controlling the pain. Decrease or eliminate those factors that significantly contribute to your pain such as smoking, obesity, and a diet heavily tilted towards inflammatory nutrients.  Last Updated: 05/13/2023   ______________________________________________________________________       ______________________________________________________________________    National Pain Medication Shortage  The [place] is experiencing worsening drug shortages. These have had a negative widespread effect on patient care and treatment. Not expected to improve any time soon. Predicted to last past 2029.   Drug  shortage list (generic names) Oxycodone  IR Oxycodone /APAP Oxymorphone IR Hydromorphone Hydrocodone /APAP Morphine   Where is the problem?  Manufacturing and supply level.  Will this shortage affect you?  Only if you take any of the above pain medications.  How? You may be unable to fill your prescription.  Your pharmacist may offer a partial fill of your prescription. (Warning: Do not accept partial fills.) Prescriptions partially filled cannot be transferred to another pharmacy. Read our Medication Rules and Regulation. Depending on how much medicine you are dependent on, you may experience withdrawals when unable to get the medication.  Recommendations: Consider ending your dependence  on opioid pain medications. Ask your pain specialist to assist you with the process. Consider switching to a medication currently not in shortage, such as Buprenorphine. Talk to your pain specialist about this option. Consider decreasing your pain medication requirements by managing tolerance thru Drug Holidays. This may help minimize withdrawals, should you run out of medicine. Control your pain thru the use of non-pharmacological interventional therapies.   Your prescriber: Prescribers cannot be blamed for shortages. Medication manufacturing and supply issues cannot be fixed by the prescriber.   NOTE: The prescriber is not responsible for supplying the medication, or solving supply issues. Work with your pharmacist to solve it. The patient is responsible for the decision to take or continue taking the medication and for identifying and securing a legal supply source. By law, supplying the medication is the job and responsibility of the pharmacy. The prescriber is responsible for the evaluation, monitoring, and prescribing of these medications.   Prescribers will NOT: Re-issue prescriptions that have been partially filled. Re-issue prescriptions already sent to a pharmacy.  Re-send prescriptions to  a different pharmacy because yours did not have your medication. Ask pharmacist to order more medicine or transfer the prescription to another pharmacy. (Read below.)  New 2023 regulation: July 06, 2022 Revised Regulation Allows DEA-Registered Pharmacies to Transfer Electronic Prescriptions at a Patient's Request DEA Headquarters Division - Public Information Office Patients now have the ability to request their electronic prescription be transferred to another pharmacy without having to go back to their practitioner to initiate the request. This revised regulation went into effect on Monday, July 02, 2022.     At a patient's request, a DEA-registered retail pharmacy can now transfer an electronic prescription for a controlled substance (schedules II-V) to another DEA-registered retail pharmacy. Prior to this change, patients would have to go through their practitioner to cancel their prescription and have it re-issued to a different pharmacy. The process was taxing and time consuming for both patients and practitioners.    The Drug Enforcement Administration [employer]) published its intent to revise the process for transferring electronic prescriptions on September 23, 2020.  The final rule was published in the federal register on May 31, 2022 and went into effect 30 days later.  Under the final rule, a prescription can only be transferred once between pharmacies, and only if allowed under existing state or other applicable law. The prescription must remain in its electronic form; may not be altered in any way; and the transfer must be communicated directly between two licensed pharmacists. It's important to note, any authorized refills transfer with the original prescription, which means the entire prescription will be filled at the same pharmacy.  Reference: hugehand.is [facility] website  announcement)  Cheapwipes.at.pdf [employer])   [employer] / Vol. 88, No. 143 / Thursday, May 31, 2022 / Rules and Regulations DEPARTMENT OF JUSTICE  Drug Enforcement Administration  21 CFR Part 1306  [Docket No. [id number]]  RIN [id number] Transfer of Electronic Prescriptions for Schedules II-V Controlled Substances Between Pharmacies for Initial Filling  ______________________________________________________________________       ______________________________________________________________________    Transfer of Pain Medication between Pharmacies  Re: 2023 DEA Clarification on existing regulation  Published on DEA Website: July 06, 2022  Title: Revised Regulation Allows DEA-Registered Pharmacies to Electrical Engineer Prescriptions at a Patient's Request DEA Headquarters Division - [employer]  Patients now have the ability to request their electronic prescription be transferred to another pharmacy without having to go  back to their practitioner to initiate the request. This revised regulation went into effect on Monday, July 02, 2022.     At a patient's request, a DEA-registered retail pharmacy can now transfer an electronic prescription for a controlled substance (schedules II-V) to another DEA-registered retail pharmacy. Prior to this change, patients would have to go through their practitioner to cancel their prescription and have it re-issued to a different pharmacy. The process was taxing and time consuming for both patients and practitioners.    The Drug Enforcement Administration [facility]) published its intent to revise the process for transferring electronic prescriptions on September 23, 2020.  The final rule was published in the federal register on May 31, 2022 and went into effect 30 days later.  Under the final rule, a prescription can only be transferred once between  pharmacies, and only if allowed under existing state or other applicable law. The prescription must remain in its electronic form; may not be altered in any way; and the transfer must be communicated directly between two licensed pharmacists. It's important to note, any authorized refills transfer with the original prescription, which means the entire prescription will be filled at the same pharmacy.    REFERENCES: 1. DEA website announcement hugehand.is  2. Department of Justice website  Cheapwipes.at.pdf  3. DEPARTMENT OF JUSTICE Drug Enforcement Administration 21 CFR Part 1306 [Docket No. [id number]] RIN [id number]-AB64 Transfer of Electronic Prescriptions for Schedules II-V Controlled Substances Between Pharmacies for Initial Filling  ______________________________________________________________________       ______________________________________________________________________    Medication Rules  Purpose: To inform patients, and their family members, of our medication rules and regulations.  Applies to: All patients receiving prescriptions from our practice (written or electronic).  Pharmacy of record: This is the pharmacy where your electronic prescriptions will be sent. Make sure we have the correct one.  Electronic prescriptions: In compliance with the [place]  Strengthen Opioid Misuse Prevention (STOP) Act of 2017 (Session Law 2017-74/H243), effective November 05, 2018, all controlled substances must be electronically prescribed. Written prescriptions, faxing, or calling prescriptions to a pharmacy will no longer be done.  Prescription refills: These will be provided only during in-person appointments. No medications will be renewed without a face-to-face evaluation with your provider. Applies to all prescriptions.  NOTE:  The following applies primarily to controlled substances (Opioid* Pain Medications).   Type of encounter (visit): For patients receiving controlled substances, face-to-face visits are required. (Not an option and not up to the patient.)  Patient's Responsibilities: Pain Pills: Bring all pain pills to every appointment (except for procedure appointments). Pill counts are required.  Pill Bottles: Bring pills in original pharmacy bottle. Bring bottle, even if empty. Always bring the bottle of the most recent fill.  Medication refills: You are responsible for knowing and keeping track of what medications you are taking and when is it that you will need a refill. The day before your appointment: write a list of all prescriptions that need to be refilled. The day of the appointment: give the list to the admitting nurse. Prescriptions will be written only during appointments. No prescriptions will be written on procedure days. If you forget a medication: it will not be Called in, Faxed, or electronically sent. You will need to get another appointment to get these prescribed. No early refills. Do not call asking to have your prescription filled early. Partial  or short prescriptions: Occasionally your pharmacy may not have enough pills to fill your prescription.  NEVER ACCEPT a partial fill  or a prescription that is short of the total amount of pills that you were prescribed.  With controlled substances the law allows 72 hours for the pharmacy to complete the prescription.  If the prescription is not completed within 72 hours, the pharmacist will require a new prescription to be written. This means that you will be short on your medicine and we WILL NOT send another prescription to complete your original prescription.  Instead, request the pharmacy to send a carrier to a nearby branch to get enough medication to provide you with your full prescription. Prescription Accuracy: You are responsible for  carefully inspecting your prescriptions before leaving our office. Have the discharge nurse carefully go over each prescription with you, before taking them home. Make sure that your name is accurately spelled, that your address is correct. Check the name and dose of your medication to make sure it is accurate. Check the number of pills, and the written instructions to make sure they are clear and accurate. Make sure that you are given enough medication to last until your next medication refill appointment. Taking Medication: Take medication as prescribed. When it comes to controlled substances, taking less pills or less frequently than prescribed is permitted and encouraged. Never take more pills than instructed. Never take the medication more frequently than prescribed.  Inform other Doctors: Always inform, all of your healthcare providers, of all the medications you take. Pain Medication from other Providers: You are not allowed to accept any additional pain medication from any other Doctor or Healthcare provider. There are two exceptions to this rule. (see below) In the event that you require additional pain medication, you are responsible for notifying us , as stated below. Cough Medicine: Often these contain an opioid, such as codeine or hydrocodone . Never accept or take cough medicine containing these opioids if you are already taking an opioid* medication. The combination may cause respiratory failure and death. Medication Agreement: You are responsible for carefully reading and following our Medication Agreement. This must be signed before receiving any prescriptions from our practice. Safely store a copy of your signed Agreement. Violations to the Agreement will result in no further prescriptions. (Additional copies of our Medication Agreement are available upon request.) Laws, Rules, & Regulations: All patients are expected to follow all [address] and [employer], Rules, [employer].  Ignorance of the Laws does not constitute a valid excuse.  Illegal drugs and Controlled Substances: The use of illegal substances (including, but not limited to marijuana and its derivatives) and/or the illegal use of any controlled substances is strictly prohibited. Violation of this rule may result in the immediate and permanent discontinuation of any and all prescriptions being written by our practice. The use of any illegal substances is prohibited. Adopted CDC guidelines & recommendations: Target dosing levels will be at or below 60 MME/day. Use of benzodiazepines** is not recommended. Urine Drug testing: Patients taking controlled substances will be required to provide a urine sample upon request. Do not void before coming to your medication management appointments. Hold emptying your bladder until you are admitted. The admitting nurse will inform you if a sample is required. Our practice reserves the right to call you at any time to provide a sample. Once receiving the call, you have 24 hours to comply with request. Not providing a sample upon request may result in termination of medication therapy.  Exceptions: There are only two exceptions to the rule of not receiving pain medications from other Healthcare Providers. Exception #1 (  Emergencies): In the event of an emergency (i.e.: accident requiring emergency care), you are allowed to receive additional pain medication. However, you are responsible for: As soon as you are able, call our office [phone], at any time of the day or night, and leave a message stating your name, the date and nature of the emergency, and the name and dose of the medication prescribed. In the event that your call is answered by a member of our staff, make sure to document and save the date, time, and the name of the person that took your information.  Exception #2 (Planned Surgery): In the event that you are scheduled by another doctor or dentist to have any type of  surgery or procedure, you are allowed (for a period no longer than 30 days), to receive additional pain medication, for the acute post-op pain. However, in this case, you are responsible for picking up a copy of our Post-op Pain Management for Surgeons handout, and giving it to your surgeon or dentist. This document is available at our office, and does not require an appointment to obtain it. Simply go to our office during business hours (Monday-Thursday from 8:00 AM to 4:00 PM) (Friday 8:00 AM to 12:00 Noon) or if you have a scheduled appointment with us , prior to your surgery, and ask for it by name. In addition, you are responsible for: calling our office [phone], at any time of the day or night, and leaving a message stating your name, name of your surgeon, type of surgery, and date of procedure or surgery. Failure to comply with your responsibilities may result in termination of therapy involving the controlled substances.  Consequences:  Non-compliance with the above rules may result in permanent discontinuation of medication prescription therapy. All patients receiving any type of controlled substance is expected to comply with the above patient responsibilities. Not doing so may result in permanent discontinuation of medication prescription therapy. Medication Agreement Violation. Following the above rules, including your responsibilities will help you in avoiding a Medication Agreement Violation ("Breaking your Pain Medication Contract").  *Opioid medications include: morphine , codeine, oxycodone , oxymorphone, hydrocodone , hydromorphone, meperidine, tramadol , tapentadol, buprenorphine, fentanyl , methadone. **Benzodiazepine medications include: diazepam  (Valium ), alprazolam (Xanax), clonazepam (Klonopine), lorazepam (Ativan), clorazepate (Tranxene), chlordiazepoxide (Librium), estazolam (Prosom), oxazepam (Serax), temazepam (Restoril), triazolam (Halcion) (Last updated:  08/28/2023) ______________________________________________________________________      ______________________________________________________________________    Medication Recommendations and Reminders  Applies to: All patients receiving prescriptions (written and/or electronic).  Medication Rules & Regulations: You are responsible for reading, knowing, and following our Medication Rules document. These exist for your safety and that of others. They are not flexible and neither are we. Dismissing or ignoring them is an act of non-compliance that may result in complete and irreversible termination of such medication therapy. For safety reasons, non-compliance will not be tolerated. As with the [place] fundamental legal principle of ignorance of the law is no defense, we will accept no excuses for not having read and knowing the content of documents provided to you by our practice.  Pharmacy of record:  Definition: This is the pharmacy where your electronic prescriptions will be sent.  We do not endorse any particular pharmacy. It is up to you and your insurance to decide what pharmacy to use.  We do not restrict you in your choice of pharmacy. However, once we write for your prescriptions, we will NOT be re-sending more prescriptions to fix restricted supply problems created by your pharmacy, or your insurance.  The  pharmacy listed in the electronic medical record should be the one where you want electronic prescriptions to be sent. If you choose to change pharmacy, simply notify our nursing staff. Changes will be made only during your regular appointments and not over the phone.  Recommendations: Keep all of your pain medications in a safe place, under lock and key, even if you live alone. We will NOT replace lost, stolen, or damaged medication. We do not accept Police Reports as proof of medications having been stolen. After you fill your prescription, take 1 week's worth of pills and  put them away in a safe place. You should keep a separate, properly labeled bottle for this purpose. The remainder should be kept in the original bottle. Use this as your primary supply, until it runs out. Once it's gone, then you know that you have 1 week's worth of medicine, and it is time to come in for a prescription refill. If you do this correctly, it is unlikely that you will ever run out of medicine. To make sure that the above recommendation works, it is very important that you make sure your medication refill appointments are scheduled at least 1 week before you run out of medicine. To do this in an effective manner, make sure that you do not leave the office without scheduling your next medication management appointment. Always ask the nursing staff to show you in your prescription , when your medication will be running out. Then arrange for the receptionist to get you a return appointment, at least 7 days before you run out of medicine. Do not wait until you have 1 or 2 pills left, to come in. This is very poor planning and does not take into consideration that we may need to cancel appointments due to bad weather, sickness, or emergencies affecting our staff. DO NOT ACCEPT A Partial Fill: If for any reason your pharmacy does not have enough pills/tablets to completely fill or refill your prescription, do not allow for a partial fill. The law allows the pharmacy to complete that prescription within 72 hours, without requiring a new prescription. If they do not fill the rest of your prescription within those 72 hours, you will need a separate prescription to fill the remaining amount, which we will NOT provide. If the reason for the partial fill is your insurance, you will need to talk to the pharmacist about payment alternatives for the remaining tablets, but again, DO NOT ACCEPT A PARTIAL FILL, unless you can trust your pharmacist to obtain the remainder of the pills within 72  hours.  Prescription refills and/or changes in medication(s):  Prescription refills, and/or changes in dose or medication, will be conducted only during scheduled medication management appointments. (Applies to both, written and electronic prescriptions.) No refills on procedure days. No medication will be changed or started on procedure days. No changes, adjustments, and/or refills will be conducted on a procedure day. Doing so will interfere with the diagnostic portion of the procedure. No phone refills. No medications will be called into the pharmacy. No Fax refills. No weekend refills. No Holliday refills. No after hours refills.  Remember:  Business hours are:  Monday to Thursday 8:00 AM to 4:00 PM Provider's Schedule: Eric Como, MD - Appointments are:  Medication management: Monday and Wednesday 8:00 AM to 4:00 PM Procedure day: Tuesday and Thursday 7:30 AM to 4:00 PM Wallie Sherry, MD - Appointments are:  Medication management: Tuesday and Thursday 8:00 AM to 4:00 PM Procedure day:  Monday and Wednesday 7:30 AM to 4:00 PM (Last update: 08/28/2022) ______________________________________________________________________      ______________________________________________________________________    Patient information on: Body mass index (BMI) and Weight Management  Dear Ms. Rester you are receiving this information because your weight may be adversely affecting your health.   Your current Estimated body mass index is 43.08 kg/m as calculated from the following:   Height as of 08/21/23: 5' 1 (1.549 m).   Weight as of 09/27/23: 228 lb (103.4 kg).  We recommend you talk to your primary care physician about providing or referring you to a supervised weight management program.  Here is some information about weight and the body mass index (BMI) classification:  BMI is a measure of obesity that's calculated by dividing a person's weight in kilograms by their height  in meters squared. A person can use an online calculator to determine their BMI. Body mass index (BMI) is a common tool for deciding whether a person has an appropriate body weight.  It measures a person's weight in relation to their height.  According to the [facility] of health (NIH): A BMI of less than 18.5 means that a person is underweight. A BMI of between 18.5 and 24.9 is ideal. A BMI of between 25 and 29.9 is overweight. A BMI over 30 indicates obesity.  Body Mass Index (BMI) Classification BMI level (kg/m2) Category Associated incidence of chronic pain  <18  Underweight   18.5-24.9 Ideal body weight   25-29.9 Overweight  20%  30-34.9 Obese (Class I)  68%  35-39.9 Severe obesity (Class II)  136%  >40 Extreme obesity (Class III)  254%    Morbidly Obese Classification: You will be considered to be Morbidly Obese if your BMI is above 30 and you have one or more of the following conditions caused or associated to obesity: 1.    Type 2 Diabetes (Leading to cardiovascular diseases (CVD), stroke, peripheral vascular diseases (PVD), retinopathy, nephropathy, and neuropathy) 2.    Cardiovascular Disease (High Blood Pressure; Congestive Heart Failure; High Cholesterol; Coronary Artery Disease; Angina; Arrhythmias, Dysrhythmias, or Heart Attacks) 3.    Breathing problems (Asthma; obesity-hypoventilation syndrome; obstructive sleep apnea; chronic inflammatory airway disease; reactive airway disease; or shortness of breath) 4.    Chronic kidney disease 5.    Liver disease (nonalcoholic fatty liver disease) 6.    High blood pressure 7.    Acid reflux (gastroesophageal reflux disease; heartburn) 8.    Osteoarthritis (OA) (affecting the hip(s), the knee(s) and/or the lower back) (usually requiring knee and/or hip replacements, as well as back surgeries) 9.    Low back pain (Lumbar Facet Syndrome; and/or Degenerative Disc Disease) 10.  Hip pain (Osteoarthritis of hip) (For every 1 lbs of  added body weight, there is a 2 lbs increase in pressure inside of each hip articulation. 1:2 mechanical relationship) 11.  Knee pain (Osteoarthritis of knee) (For every 1 lbs of added body weight, there is a 4 lbs increase in pressure inside of each knee articulation. 1:4 mechanical relationship) (patients with a BMI>30 kg/m2 were 6.8 times more likely to develop knee OA than normal-weight individuals) 12.  Cancer: Epidemiological studies have shown that obesity is a risk factor for: post-menopausal breast cancer; cancers of the endometrium, colon and kidney cancer; malignant adenomas of the esophagus. Obese subjects have an approximately 1.5-3.5-fold increased risk of developing these cancers compared with normal-weight subjects, and it has been estimated that between 15 and 45% of these cancers can be attributed  to overweight. More recent studies suggest that obesity may also increase the risk of other types of cancer, including pancreatic, hepatic and gallbladder cancer. (Ref: Obesity and cancer. Pischon T, Nthlings U, [employer] H. Proc Nutr Soc. 2008 May;67(2):128-45. doi: 10.1017/S0029665108006976.) The International Agency for Research on Cancer (IARC) has identified 13 cancers associated with overweight and obesity: meningioma, multiple myeloma, adenocarcinoma of the esophagus, and cancers of the thyroid , postmenopausal breast cancer, gallbladder, stomach, liver, pancreas, kidney, ovaries, uterus, colon and rectal (colorectal) cancers. 55 percent of all cancers diagnosed in women and 24 percent of those diagnosed in men are associated with overweight and obesity.  Recommendation: If you have any of the above conditions it is urgent that you take a step back and concentrate in losing weight. Dedicate 100% of your efforts on this task. Nothing else will improve your health more than bringing your weight down and your BMI to less than 30.   Nutritionist and/or supervised weight-management program: We are  aware that most chronic pain patients are unable to exercise secondary to their pain. For this reason, you must rely on proper nutrition and diet in order to lose the weight. We recommend you talk to a nutritionist.   Bariatric surgery: A person might be considered a candidate for bariatric surgery if they meet one of the following BMI criteria:  BMI of 40 or higher: This is considered extreme obesity (Class III). BMI of 35-39.9: This is considered obesity, and the person might also have a serious weight-related health condition, such as high blood pressure, type 2 diabetes, or severe sleep apnea  BMI of 30-34.9: This might be considered if the person has serious weight-related health problems and hasn't had substantial weight loss or improvement in co-morbidities through other methods   On your own: A realistic goal is to lose 10% of your body weight over a period of 12 months.  If over a period of six (6) months you have unsuccessfully tried to lose weight, then it is time for you to seek professional help and to enter a medically supervised weight management program, and/or undergo bariatric surgery.   Pain management considerations and possible limitations:  1.    Pharmacological Problems: Be advised that the use of opioid analgesics (oxycodone ; hydrocodone ; morphine ; methadone; codeine; and all of their derivatives) have been associated with decreased metabolism and weight gain.  For this reason, should we see that you are unable to lose weight while taking these medications, it may become necessary for us  to taper down and indefinitely discontinue them.  2.    Technical Problems: The incidence of successful interventional therapies decreases as the patient's BMI increases. It is much more difficult to accomplish a safe and effective interventional therapy on a patient with a BMI above 35. 3.    Radiation Exposure Problems: The x-rays machine, used to accomplish injection therapies, will  automatically increase their x-ray output in order to capture an appropriate bone image. This means that radiation exposure increases exponentially with the patient's BMI. (The higher the BMI, the higher the radiation exposure.) Although the level of radiation used at a given time is still safe to the patient, it is not for the physician and/or assisting staff. Unfortunately, radiation exposure is accumulative. Because physicians and the staff have to do procedures and be exposed on a daily basis, this can result in health problems such as cancer and radiation burns. Radiation exposure to the staff is monitored by the radiation batches that they wear. The exposure levels  are reported back to the staff on a quarterly basis. Depending on levels of exposure, physicians and staff may be obligated by law to decrease this exposure. This means that they have the right and obligation to refuse providing therapies where they may be overexposed to radiation. For this reason, physicians may decline to offer therapies such as radiofrequency ablation or implants to patients with a BMI above 40. 4.    Current Trends: Be advised that the current trend is to no longer offer certain therapies to patients with a BMI equal to, or above 35, due to increase perioperative risks, increased technical procedural difficulties, and excessive radiation exposure to healthcare personnel.  Last updated: 07/29/2023 ______________________________________________________________________

## 2023-11-19 NOTE — Progress Notes (Signed)
 PROVIDER NOTE: Information contained herein reflects review and annotations entered in association with encounter. Interpretation of such information and data should be left to medically-trained personnel. Information provided to patient can be located elsewhere in the medical record under Patient Instructions. Document created using STT-dictation technology, any transcriptional errors that may result from process are unintentional.    Patient: Shannon Benitez  Service Category: E/M  Provider: Eric DELENA Como, MD  DOB: Nov 10, 1970  DOS: 11/19/2023  Referring Provider: Derenda Rockers, MD  MRN: [id number]  Specialty: Interventional Pain Management  PCP: Derenda Rockers, MD  Type: Established Patient  Setting: Ambulatory outpatient    Location: Office  Delivery: Face-to-face     HPI  Shannon Benitez, a 53 y.o. year old female, is here today because of her Chronic pain syndrome [G89.4]. Shannon Benitez primary complain today is Back Pain (lower)  Pertinent problems: Shannon Benitez has Multiple sclerosis (HCC); Neurogenic pain; Chronic sacroiliac joint pain (Bilateral) (L>R); Chronic pain syndrome; Chronic low back pain (1ry area of Pain) (Bilateral) (L>R) w/ sciatica (Bilateral); Chronic lower extremity pain (2ry area of Pain) (Bilateral) (R>L); Chronic low back pain (Bilateral) (L>R) w/o sciatica; Muscle spasticity; Spondylosis without myelopathy or radiculopathy, lumbosacral region; Lumbar facet syndrome (Bilateral) (R>L); Abnormal MRI, lumbar spine (04/18/2023); Lumbar facet hypertrophy (Multilevel) (Bilateral); Osteoarthritis of facet joint of lumbar spine; Osteoarthritis of lumbar spine; DDD (degenerative disc disease), lumbosacral; Lumbar spondylosis; Diabetic peripheral neuropathy (HCC); 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); Abnormal MRI, cervical spine (12/27/20); Scapulalgia (Left); Traumatic injury of scapula (Left); Other spondylosis, sacral and sacrococcygeal  region; Acute exacerbation of chronic low back pain; Chronic knee pain (Bilateral); Osteoarthritis of knee (Right); Osteochondroma of proximal fibula (Right); Osteochondroma of proximal tibia (Right); Osteoarthritis involving multiple joints; Abnormal MRI, thoracic spine (12/27/2020); Chronic hip pain (Bilateral); Osteoarthritis of hips (Bilateral); Fibromyalgia; Intractable low back pain; Myofascial pain syndrome of lumbar spine; Demyelinating disease of central nervous system, unspecified (HCC); and Lumbar facet joint pain on their pertinent problem list. Pain Assessment: Severity of Chronic pain is reported as a 10-Worst pain ever/10. Location: Back Lower/both legs to the feet. Onset: More than a month ago. Quality: Aching. Timing: Intermittent. Modifying factor(s): use walker, sitting. Vitals:  height is 5' (1.524 m) and weight is 230 lb (104.3 kg). Her temporal temperature is 97.3 F (36.3 C) (abnormal). Her blood pressure is 101/70 and her pulse is 72. Her respiration is 16 and oxygen saturation is 99%.  BMI: Estimated body mass index is 44.92 kg/m as calculated from the following:   Height as of this encounter: 5' (1.524 m).   Weight as of this encounter: 230 lb (104.3 kg). Last encounter: 11/18/2023. Last procedure: Visit date not found.  Reason for encounter: medication management.  The patient indicates doing well with the current medication regimen. No adverse reactions or side effects reported to the medications.   Discussed the use of AI scribe software for clinical note transcription with the patient, who gave verbal consent to proceed.  History of Present Illness   The patient, with a known history of multiple sclerosis (MS) and anemia, presents with increased lower back pain. She reports a series of falls, approximately five in total, since her last visit. The most recent fall resulted in a hairline fracture of the right kneecap and tibia, which did not require surgical intervention.  The patient has been using a walker for mobility.  The patient's MS has been a long-standing issue, with a duration of 18 years. She is aware of  the progressive nature of the disease, having witnessed a family member's experience with MS. The cause of the anemia is suspected to be low vitamin B12 levels. She has received seven iron  infusions so far, with the most recent one being last week. The patient has a history of blood transfusions, but these were performed 11 years ago.  The patient's pain is primarily located across the lower back, on both sides, with some radiation down the legs. She has a history of facet joint issues.  The patient also reports significant weight loss in the past, but regained the weight after a fall down concrete steps two years ago. This fall resulted in multiple injuries, including head trauma requiring staples, broken toes, knee injury, and broken fingers.  The patient is currently on pain medication, which she tolerates well. She is advised to periodically taper off the medication to prevent tolerance. The patient's prescription monitoring program and urine drug screening results are satisfactory.     RTCB: 02/17/2024   Pharmacotherapy Assessment  Analgesic: Oxycodone  IR 5 mg, 1 tab p.o. every 6 hours MME/day: 30 mg/day.   Monitoring: [place] PMP: PDMP reviewed during this encounter.       Pharmacotherapy: No side-effects or adverse reactions reported. Compliance: No problems identified. Effectiveness: Clinically acceptable.  Shannon Reda CROME, RN  11/19/2023 12:56 PM  Sign when Signing Visit Nursing Pain Medication Assessment:  Safety precautions to be maintained throughout the outpatient stay will include: orient to surroundings, keep bed in low position, maintain call bell within reach at all times, provide assistance with transfer out of bed and ambulation.  Medication Inspection Compliance: Pill count conducted under aseptic conditions, in front of the patient.  Neither the pills nor the bottle was removed from the patient's sight at any time. Once count was completed pills were immediately returned to the patient in their original bottle.  Medication: Oxycodone  IR Pill/Patch Count:  12 of 120 pills remain Pill/Patch Appearance: Markings consistent with prescribed medication Bottle Appearance: Standard pharmacy container. Clearly labeled. Filled Date: 60 / 14 / 2024 Last Medication intake:  Today    No results found for: CBDTHCR No results found for: D8THCCBX No results found for: D9THCCBX  UDS:  Summary  Date Value Ref Range Status  12/26/2022 Note  Corrected    Comment:    ==================================================================== [employer] [facility] 13 (MW) ==================================================================== Test                             Result       Flag       Units  Drug Present and Declared for Prescription Verification   Oxycodone                       2390         EXPECTED   ng/mg creat   Noroxycodone                   5988         EXPECTED   ng/mg creat   Noroxymorphone                 61           EXPECTED   ng/mg creat    Sources of oxycodone  include scheduled prescription medications.    Noroxycodone and noroxymorphone are expected metabolites of    oxycodone .    Phenobarbital   PRESENT      EXPECTED    Phenobarbital  is an expected metabolite of primidone ; Phenobarbital     may also be administered as a prescription drug.  ==================================================================== Test                      Result    Flag   Units      Ref Range   Creatinine              163              mg/dL      >=79 ==================================================================== Declared Medications:  The flagging and interpretation on this report are based on the  following declared medications.  Unexpected results may arise from  inaccuracies in the declared medications.    **Note: The testing scope of this panel includes these medications:   Oxycodone  (OxyIR)  Primidone  (Mysoline )   **Note: The testing scope of this panel does not include the  following reported medications:   Omeprazole (Prilosec)  Pregabalin  (Lyrica )  Propranolol  (Inderal )  Sertraline  (Zoloft )  Sitagliptin (Januvia) ==================================================================== For clinical consultation, please call [phone]. ====================================================================       ROS  Constitutional: Denies any fever or chills Gastrointestinal: No reported hemesis, hematochezia, vomiting, or acute GI distress Musculoskeletal: Denies any acute onset joint swelling, redness, loss of ROM, or weakness Neurological: No reported episodes of acute onset apraxia, aphasia, dysarthria, agnosia, amnesia, paralysis, loss of coordination, or loss of consciousness  Medication Review  Cholecalciferol, Dimethyl Fumarate , aspirin-acetaminophen -caffeine , baclofen , lamoTRIgine , levETIRAcetam , loperamide , meloxicam , metFORMIN, mirabegron  ER, naloxone , omeprazole, oxyCODONE , pravastatin , pregabalin , primidone , propranolol , and sitaGLIPtin  History Review  Allergy: Ms. Wirtanen is allergic to 5-alpha reductase inhibitors, desvenlafaxine, glatiramer, rebif [interferon beta-1a], gramineae pollens, latex, and tramadol . Drug: Ms. Santerre  has no history on file for drug use. Alcohol:  reports no history of alcohol use. Tobacco:  reports that she quit smoking about 6 years ago. Her smoking use included cigarettes. She started smoking about 24 years ago. She has a 9 pack-year smoking history. She has quit using smokeless tobacco. Social: Ms. Brash  reports that she quit smoking about 6 years ago. Her smoking use included cigarettes. She started smoking about 24 years ago. She has a 9 pack-year smoking history. She has quit using smokeless tobacco. She reports that  she does not drink alcohol. Medical:  has a past medical history of Acute postoperative pain (04/30/2019), Diabetes mellitus without complication (HCC), GERD (gastroesophageal reflux disease), Headache, MS (multiple sclerosis) (HCC), Numbness and tingling of both legs below knees, Osteoarthritis, Vertigo, and Wears dentures. Surgical: Ms. Melfi  has a past surgical history that includes Abdominal hysterectomy; Cesarean section; Tumor removal (Left); Cataract extraction w/PHACO (Right, 03/18/2019); and Cataract extraction w/PHACO (Left, 04/13/2019). Family: family history is not on file. She was adopted.  Laboratory Chemistry Profile   Renal Lab Results  Component Value Date   BUN 19 10/01/2022   CREATININE 0.59 10/01/2022   BCR 13 11/27/2018   GFRAA >60 09/25/2019   GFRNONAA >60 10/01/2022    Hepatic Lab Results  Component Value Date   AST 18 10/01/2022   ALT 14 10/01/2022   ALBUMIN 4.0 10/01/2022   ALKPHOS 91 10/01/2022    Electrolytes Lab Results  Component Value Date   NA 138 10/01/2022   K 3.4 (L) 10/01/2022   CL 103 10/01/2022   CALCIUM 9.7 10/01/2022   MG 2.2 11/27/2018    Bone Lab Results  Component Value Date  25OHVITD1 55 11/27/2018   25OHVITD2 <1.0 11/27/2018   25OHVITD3 55 11/27/2018    Inflammation (CRP: Acute Phase) (ESR: Chronic Phase) Lab Results  Component Value Date   CRP 10 11/27/2018   ESRSEDRATE 42 (H) 11/27/2018         Note: Above Lab results reviewed.  Recent Imaging Review  MR LUMBAR SPINE WO CONTRAST CLINICAL DATA:  Low back pain for over 6 weeks  EXAM: MRI LUMBAR SPINE WITHOUT CONTRAST  TECHNIQUE: Multiplanar, multisequence MR imaging of the lumbar spine was performed. No intravenous contrast was administered.  COMPARISON:  09/22/2020  FINDINGS: Segmentation:  Standard.  Alignment:  2 mm retrolisthesis of L5 on S1.  Vertebrae: No acute fracture, evidence of discitis, or aggressive bone lesion.  Conus medullaris and  cauda equina: Conus extends to the L2 level. Conus and cauda equina appear normal.  Paraspinal and other soft tissues: No acute paraspinal abnormality.  Disc levels:  Disc spaces: Disc desiccation at L3-4, L4-5 and L5-S1.  T12-L1: No significant disc bulge. No neural foraminal stenosis. No central canal stenosis.  L1-L2: No significant disc bulge. Mild bilateral facet arthropathy. No foraminal or central canal stenosis.  L2-L3: No significant disc bulge. Mild bilateral facet arthropathy. No foraminal or central canal stenosis.  L3-L4: No significant disc bulge. Mild bilateral facet arthropathy. No foraminal or central canal stenosis.  L4-L5: Mild broad-based disc bulge with a small shallow central disc protrusion. Mild bilateral facet arthropathy. No foraminal or central canal stenosis.  L5-S1: Mild broad-based disc bulge. Mild bilateral facet arthropathy. No foraminal or central canal stenosis.  IMPRESSION: 1. Mild lumbar spine spondylosis as described above without significant interval change compared with 09/22/2020. 2. No acute osseous injury of the lumbar spine.  Electronically Signed   By: Julaine Blanch M.D.   On: 04/27/2023 07:23 Note: Reviewed        Physical Exam  General appearance: Well nourished, well developed, and well hydrated. In no apparent acute distress Mental status: Alert, oriented x 3 (person, place, & time)       Respiratory: No evidence of acute respiratory distress Eyes: PERLA Vitals: BP 101/70   Pulse 72   Temp (!) 97.3 F (36.3 C) (Temporal)   Resp 16   Ht 5' (1.524 m)   Wt 230 lb (104.3 kg)   SpO2 99%   BMI 44.92 kg/m  BMI: Estimated body mass index is 44.92 kg/m as calculated from the following:   Height as of this encounter: 5' (1.524 m).   Weight as of this encounter: 230 lb (104.3 kg). Ideal: Ideal body weight: 45.5 kg (100 lb 4.9 oz) Adjusted ideal body weight: 69 kg (152 lb 3 oz)  Assessment   Diagnosis Status  1. Chronic  pain syndrome   2. Chronic low back pain (1ry area of Pain) (Bilateral) (L>R) w/ sciatica (Bilateral)   3. Chronic lower extremity pain (2ry area of Pain) (Bilateral) (R>L)   4. Multiple sclerosis (HCC)   5. Chronic sacroiliac joint pain (Bilateral) (L>R)   6. Lumbar facet joint pain   7. Lumbar facet syndrome (Bilateral) (R>L)   8. Chronic hip pain (Bilateral)   9. Chronic knee pain (Bilateral)   10. Obesity, Class III, BMI 40-49.9 (morbid obesity) (HCC)   11. Pharmacologic therapy   12. Chronic use of opiate for therapeutic purpose   13. Encounter for medication management   14. Encounter for chronic pain management    Controlled Controlled Controlled   Updated Problems: No problems updated.  Plan of Care  Problem-specific:  Assessment and Plan    Chronic Lower Back Pain Chronic lower back pain, primarily in the facet joints, is accompanied by radiculopathy and a history of multiple falls contributing to the pain. The last procedural injection was in November 2023. Procedural injections are being considered for pain relief, as radiofrequency ablation is not recommended due to a BMI over 40, which reduces its effectiveness. Plan to schedule procedural injections and discuss back braces with orthopedics or physical therapy.  Fractured Right Patella and Tibia A recent fall resulted in a fractured right patella and tibia, with no surgery performed. Mobility is maintained using a walker. Continue using the walker and discuss knee braces with orthopedics or physical therapy.  Multiple Sclerosis (MS) Diagnosed with MS 18 years ago, which contributes to falls and overall weakness. The condition is progressive. Continue current MS management and monitor for new symptoms or progression.  Pain Medication Management Currently on pain medication without adverse reactions. The importance of tapering to avoid tolerance was discussed. The tapering process involves reducing by one pill per week  until stopping for two weeks, then restarting if needed. Send 90-day pain medication refills in 30-day increments and educate on the tapering process.  Anemia Anemia requires iron  infusions, with seven infusions completed and labs scheduled in February. Anemia contributes to weakness. Continue iron  infusions as needed and repeat labs in February to assess anemia status.  General Health Maintenance There is an awareness of the need for weight loss, with a history of losing 100 pounds. Significant injuries from falls, including head injuries and fractures, are noted. Discuss weight loss strategies and consider alternatives for weight management with the primary care physician.  Follow-up Schedule follow-up for procedural injections and lab work in February.      Shannon Benitez has a current medication list which includes the following long-term medication(s): lamotrigine , naloxone , oxycodone , [START ON 12/19/2023] oxycodone , [START ON 01/18/2024] oxycodone , baclofen , levetiracetam , meloxicam , metformin, omeprazole, pravastatin , pregabalin , primidone , propranolol , and sitagliptin.  Pharmacotherapy (Medications Ordered): Meds ordered this encounter  Medications   oxyCODONE  (OXY IR/ROXICODONE ) 5 MG immediate release tablet    Sig: Take 1 tablet (5 mg total) by mouth every 6 (six) hours as needed for severe pain (pain score 7-10). Must last 30 days.    Dispense:  120 tablet    Refill:  0    DO NOT: delete (not duplicate); no partial-fill (will deny script to complete), no refill request (F/U required). DISPENSE: 1 day early if closed on fill date. WARN: No CNS-depressants within 8 hrs of med.   oxyCODONE  (OXY IR/ROXICODONE ) 5 MG immediate release tablet    Sig: Take 1 tablet (5 mg total) by mouth every 6 (six) hours as needed for severe pain (pain score 7-10). Must last 30 days.    Dispense:  120 tablet    Refill:  0    DO NOT: delete (not duplicate); no partial-fill (will deny script to  complete), no refill request (F/U required). DISPENSE: 1 day early if closed on fill date. WARN: No CNS-depressants within 8 hrs of med.   oxyCODONE  (OXY IR/ROXICODONE ) 5 MG immediate release tablet    Sig: Take 1 tablet (5 mg total) by mouth every 6 (six) hours as needed for severe pain (pain score 7-10). Must last 30 days.    Dispense:  120 tablet    Refill:  0    DO NOT: delete (not duplicate); no partial-fill (will deny script to complete), no refill request (  F/U required). DISPENSE: 1 day early if closed on fill date. WARN: No CNS-depressants within 8 hrs of med.   Orders:  Orders Placed This Encounter  Procedures   LUMBAR FACET(MEDIAL BRANCH NERVE BLOCK) MBNB    Diagnosis: Lumbar Facet Syndrome (M47.816); Lumbosacral Facet Syndrome (M47.817); Lumbar Facet Joint Pain (M54.59) Medical Necessity Statement: 1.Severe chronic axial low back pain causing functional impairment documented by ongoing pain scale assessments. 2.Pain present for longer than 3 months (Chronic) documented to have failed noninvasive conservative therapies. 3.Absence of untreated radiculopathy. 4.There is no radiological evidence of untreated fractures, tumor, infection, or deformity.  Physical Examination Findings: Positive Kemp Maneuver: (Y)  Positive Lumbar Hyperextension-Rotation provocative test: (Y)    Standing Status:   Future    Expiration Date:   02/17/2024    Scheduling Instructions:     Procedure: Lumbar facet Block     Type: Medial Branch Block     Side: Bilateral     Purpose: Palliative     Level(s): L4-5, L5-S1, and TBD by Fluoroscopic Mapping Facets (L3, L4, L5, S1, and TBD Medial Branch)     Sedation: Patient's choice.     Timeframe: ASAP    Where will this procedure be performed?:   [facility]   Follow-up plan:   Return for (Clinic): (B) L-FCT Blk #8.      Interventional Therapies  Risk Factors  Considerations:   Allergy: LATEX NOTE: NO RFA until BMI is <30  Continue  encouraging the patient to bring her BMI below 35.   Planned  Pending:   Therapeutic bilateral lumbar facet MBB #7 (#3 of 2023) (10/04/2022) NO-SHOW to pain management appointment for procedure: Bilateral lumbar facet block.  Did not call to cancel.   Under consideration:   Diagnostic bilateral T11-12 lumbar facet block   Diagnostic right T7-8 TESI   Diagnostic right T7 TFESI   Diagnostic ML CESI   Possible candidate for intrathecal pump trial and implant    Completed:   Palliative bilateral lumbar facet MBB x7 (10/25/2022) (100/95/95) Therapeutic left lumbar facet RFA x3 (05/24/2020)  Therapeutic right lumbar facet RFA x3 (04/07/2020)  (08/10/2020) referral to bariatric surgery and medical weight management    Completed by other providers:   Therapeutic/diagnostic bilateral SI Blk x1 (09/05/2021) by Lora Sport, MD (PMR)  Diagnostic/therapeutic bilateral L4/5 TFESI x1 (09/12/2017) by Arthur Manos, MD [facility] & [facility])  Diagnostic/therapeutic bilateral SI Blk x1 (01/30/2017) by Arthur Manos, MD [facility])    Therapeutic  Palliative (PRN) options:   Palliative bilateral lumbar facet MBB   Palliative left lumbar facet RFA  Palliative right lumbar facet RFA     Pharmacotherapy  Nonopioids transfer 08/10/2020: Mobic , baclofen , and Lyrica .       Recent Visits Date Type Provider Dept  11/18/23 Office Visit Tanya Glisson, MD [facility]  08/21/23 Office Visit Tanya Glisson, MD [facility]  Showing recent visits within past 90 days and meeting all other requirements Today's Visits Date Type Provider Dept  11/19/23 Office Visit Tanya Glisson, MD [facility]  Showing today's visits and meeting all other requirements Future Appointments Date Type Provider Dept  02/12/24 Appointment Tanya Glisson, MD [facility]  Showing future appointments within next 90 days and meeting all other requirements  I discussed the  assessment and treatment plan with the patient. The patient was provided an opportunity to ask questions and all were answered. The patient agreed with the plan and demonstrated an understanding of  the instructions.  Patient advised to call back or seek an in-person evaluation if the symptoms or condition worsens.  Duration of encounter: 30 minutes.  Total time on encounter, as per AMA guidelines included both the face-to-face and non-face-to-face time personally spent by the physician and/or other qualified health care professional(s) on the day of the encounter (includes time in activities that require the physician or other qualified health care professional and does not include time in activities normally performed by clinical staff). Physician's time may include the following activities when performed: Preparing to see the patient (e.g., pre-charting review of records, searching for previously ordered imaging, lab work, and nerve conduction tests) Review of prior analgesic pharmacotherapies. Reviewing PMP Interpreting ordered tests (e.g., lab work, imaging, nerve conduction tests) Performing post-procedure evaluations, including interpretation of diagnostic procedures Obtaining and/or reviewing separately obtained history Performing a medically appropriate examination and/or evaluation Counseling and educating the patient/family/caregiver Ordering medications, tests, or procedures Referring and communicating with other health care professionals (when not separately reported) Documenting clinical information in the electronic or other health record Independently interpreting results (not separately reported) and communicating results to the patient/ family/caregiver Care coordination (not separately reported)  Note by: Eric DELENA Como, MD Date: 11/19/2023; Time: 2:41 PM

## 2023-11-19 NOTE — Progress Notes (Signed)
 Nursing Pain Medication Assessment:  Safety precautions to be maintained throughout the outpatient stay will include: orient to surroundings, keep bed in low position, maintain call bell within reach at all times, provide assistance with transfer out of bed and ambulation.  Medication Inspection Compliance: Pill count conducted under aseptic conditions, in front of the patient. Neither the pills nor the bottle was removed from the patient's sight at any time. Once count was completed pills were immediately returned to the patient in their original bottle.  Medication: Oxycodone  IR Pill/Patch Count:  12 of 120 pills remain Pill/Patch Appearance: Markings consistent with prescribed medication Bottle Appearance: Standard pharmacy container. Clearly labeled. Filled Date: 86 / 14 / 2024 Last Medication intake:  Today

## 2023-12-27 ENCOUNTER — Inpatient Hospital Stay: Payer: MEDICAID

## 2023-12-27 ENCOUNTER — Inpatient Hospital Stay: Payer: MEDICAID | Attending: Internal Medicine

## 2023-12-27 ENCOUNTER — Inpatient Hospital Stay (HOSPITAL_BASED_OUTPATIENT_CLINIC_OR_DEPARTMENT_OTHER): Payer: MEDICAID | Admitting: Internal Medicine

## 2023-12-27 ENCOUNTER — Encounter: Payer: Self-pay | Admitting: Internal Medicine

## 2023-12-27 VITALS — BP 110/60 | HR 63 | Temp 97.8°F | Resp 16 | Wt 203.0 lb

## 2023-12-27 DIAGNOSIS — D509 Iron deficiency anemia, unspecified: Secondary | ICD-10-CM | POA: Insufficient documentation

## 2023-12-27 DIAGNOSIS — K219 Gastro-esophageal reflux disease without esophagitis: Secondary | ICD-10-CM | POA: Diagnosis not present

## 2023-12-27 DIAGNOSIS — R519 Headache, unspecified: Secondary | ICD-10-CM | POA: Insufficient documentation

## 2023-12-27 DIAGNOSIS — Z79899 Other long term (current) drug therapy: Secondary | ICD-10-CM | POA: Insufficient documentation

## 2023-12-27 DIAGNOSIS — Z90710 Acquired absence of both cervix and uterus: Secondary | ICD-10-CM | POA: Diagnosis not present

## 2023-12-27 DIAGNOSIS — G35 Multiple sclerosis: Secondary | ICD-10-CM | POA: Diagnosis not present

## 2023-12-27 DIAGNOSIS — E538 Deficiency of other specified B group vitamins: Secondary | ICD-10-CM

## 2023-12-27 DIAGNOSIS — G894 Chronic pain syndrome: Secondary | ICD-10-CM | POA: Diagnosis not present

## 2023-12-27 DIAGNOSIS — F17290 Nicotine dependence, other tobacco product, uncomplicated: Secondary | ICD-10-CM | POA: Insufficient documentation

## 2023-12-27 DIAGNOSIS — E119 Type 2 diabetes mellitus without complications: Secondary | ICD-10-CM | POA: Insufficient documentation

## 2023-12-27 DIAGNOSIS — D649 Anemia, unspecified: Secondary | ICD-10-CM

## 2023-12-27 DIAGNOSIS — M1990 Unspecified osteoarthritis, unspecified site: Secondary | ICD-10-CM | POA: Diagnosis not present

## 2023-12-27 DIAGNOSIS — Z885 Allergy status to narcotic agent status: Secondary | ICD-10-CM | POA: Insufficient documentation

## 2023-12-27 LAB — CBC WITH DIFFERENTIAL (CANCER CENTER ONLY)
Abs Immature Granulocytes: 0.03 10*3/uL (ref 0.00–0.07)
Basophils Absolute: 0 10*3/uL (ref 0.0–0.1)
Basophils Relative: 1 %
Eosinophils Absolute: 0.2 10*3/uL (ref 0.0–0.5)
Eosinophils Relative: 3 %
HCT: 46.5 % — ABNORMAL HIGH (ref 36.0–46.0)
Hemoglobin: 15.1 g/dL — ABNORMAL HIGH (ref 12.0–15.0)
Immature Granulocytes: 0 %
Lymphocytes Relative: 8 %
Lymphs Abs: 0.5 10*3/uL — ABNORMAL LOW (ref 0.7–4.0)
MCH: 28.4 pg (ref 26.0–34.0)
MCHC: 32.5 g/dL (ref 30.0–36.0)
MCV: 87.6 fL (ref 80.0–100.0)
Monocytes Absolute: 0.5 10*3/uL (ref 0.1–1.0)
Monocytes Relative: 7 %
Neutro Abs: 5.7 10*3/uL (ref 1.7–7.7)
Neutrophils Relative %: 81 %
Platelet Count: 222 10*3/uL (ref 150–400)
RBC: 5.31 MIL/uL — ABNORMAL HIGH (ref 3.87–5.11)
RDW: 20 % — ABNORMAL HIGH (ref 11.5–15.5)
WBC Count: 7 10*3/uL (ref 4.0–10.5)
nRBC: 0 % (ref 0.0–0.2)

## 2023-12-27 LAB — IRON AND TIBC
Iron: 124 ug/dL (ref 28–170)
Saturation Ratios: 32 % — ABNORMAL HIGH (ref 10.4–31.8)
TIBC: 384 ug/dL (ref 250–450)
UIBC: 260 ug/dL

## 2023-12-27 LAB — VITAMIN B12: Vitamin B-12: 145 pg/mL — ABNORMAL LOW (ref 180–914)

## 2023-12-27 LAB — FERRITIN: Ferritin: 38 ng/mL (ref 11–307)

## 2023-12-27 NOTE — Progress Notes (Signed)
Patient says that the iron infusions did make her feel better. She has lost about 30 lbs in a month, which she says that she is trying to lose the weight by fasting and eating smaller portion meals throughout the day.

## 2023-12-27 NOTE — Progress Notes (Signed)
[facility]  [phone] [phone]) [phone]  ID: Shannon Benitez OB: 1971-09-30  MR#: [id number]  QIO#:[id number]  Patient Care Team: Jenell Milliner, MD as PCP - General (Internal Medicine) Michaelyn Barter, MD as Consulting Physician (Oncology)  REASON FOR VISIT: Iron deficiency anemia  HPI: Shannon Benitez is a 53 y.o. female with past medical history of multiple sclerosis, diabetes, GERD, iron deficiency anemia, chronic pain syndrome referred to hematology for management of iron deficiency anemia.  Patient reports longstanding history of iron deficiency.  Has taken iron infusions previously.  Recently she has been feeling more tired and sleepy.  She is on oral iron pills for the past 6 months.  Never had endoscopy and colonoscopy.  Reports she was recommended Cologuard which she has not been able to do.  Has issues with chronic headaches.  Is on Excedrin and at times may have to take 6-12 pills a day.  Uses ibuprofen few times a week.  Has history of MS-diagnosed 18 years ago.  Interval history Patient seen today as follow-up for iron deficiency anemia and labs. Reports improvement in her energy level after completing iron infusions.  Not been able to get Cologuard testing done.  Denies any bleeding in urine or stools.  REVIEW OF SYSTEMS:   ROS  As per HPI. Otherwise, a complete review of systems is negative.  PAST MEDICAL HISTORY: Past Medical History:  Diagnosis Date   Acute postoperative pain 04/30/2019   Diabetes mellitus without complication (HCC)    GERD (gastroesophageal reflux disease)    Headache    daily   MS (multiple sclerosis) (HCC)    Numbness and tingling of both legs below knees    pt reports secondary to MS   Osteoarthritis    lumbar   Vertigo    last episode opver 18 yrs ago   Wears dentures    full upper and lower    PAST SURGICAL HISTORY: Past Surgical History:  Procedure Laterality Date   ABDOMINAL  HYSTERECTOMY     CATARACT EXTRACTION W/PHACO Right 03/18/2019   Procedure: CATARACT EXTRACTION PHACO AND INTRAOCULAR LENS PLACEMENT (IOC) RIGHT;  Surgeon: Nevada Crane, MD;  Location: [facility];  Service: Ophthalmology;  Laterality: Right;  Diabetic - oral meds Latex sensitivity   CATARACT EXTRACTION W/PHACO Left 04/13/2019   Procedure: CATARACT EXTRACTION PHACO AND INTRAOCULAR LENS PLACEMENT (IOC)  LEFT;  Surgeon: Nevada Crane, MD;  Location: [facility];  Service: Ophthalmology;  Laterality: Left;  Diabetic - oral meds   CESAREAN SECTION     TUMOR REMOVAL Left    pt was 53 years old    FAMILY HISTORY: Family History  Adopted: Yes    HEALTH MAINTENANCE: Social History   Tobacco Use   Smoking status: Former    Current packs/day: 0.00    Average packs/day: 0.5 packs/day for 18.0 years (9.0 ttl pk-yrs)    Types: Cigarettes    Start date: 12/19/1998    Quit date: 12/19/2016    Years since quitting: 7.0   Smokeless tobacco: Former  Advertising account planner   Vaping status: Every Day   Start date: 02/03/2018   Substances: Flavoring   Devices: Smock  Substance Use Topics   Alcohol use: Never     Allergies  Allergen Reactions   5-Alpha Reductase Inhibitors    Desvenlafaxine     Allergic reaction   Glatiramer Other (See Comments)    Pt states me gave her cold spells.    Rebif [Interferon Beta-1a] Other (  See Comments)    The skin around the injection site turned black   Gramineae Pollens Itching   Latex Itching    Only when gloves are worn by pt   Tramadol Diarrhea, Nausea And Vomiting and Anxiety    Current Outpatient Medications  Medication Sig Dispense Refill   aspirin-acetaminophen-caffeine (EXCEDRIN MIGRAINE) 250-250-65 MG tablet Take by mouth every 6 (six) hours as needed for headache.     baclofen (LIORESAL) 20 MG tablet Take 1 tablet (20 mg total) by mouth 4 (four) times daily. 120 tablet 2   Cholecalciferol 1.25 MG (50000 UT) capsule Take 50,000  Units by mouth once a week.     Dimethyl Fumarate 240 MG CPDR Take by mouth.     lamoTRIgine (LAMICTAL) 25 MG tablet Take 50 mg by mouth 2 (two) times daily.     levETIRAcetam (KEPPRA) 250 MG tablet Take by mouth.     loperamide (IMODIUM A-D) 2 MG tablet Take 2 mg by mouth as needed for diarrhea or loose stools.     meloxicam (MOBIC) 15 MG tablet Take 1 tablet by mouth daily.     metFORMIN (GLUCOPHAGE-XR) 500 MG 24 hr tablet Take 250 mg by mouth daily with breakfast.      mirabegron ER (MYRBETRIQ) 50 MG TB24 tablet Take 50 mg by mouth daily.      naloxone (NARCAN) nasal spray 4 mg/0.1 mL Place 1 spray into the nose as needed for up to 365 doses (for opioid-induced respiratory depresssion). In case of emergency (overdose), spray once into each nostril. If no response within 3 minutes, repeat application and call 911. 1 each 0   omeprazole (PRILOSEC) 20 MG capsule Take 1 capsule by mouth daily.     oxyCODONE (OXY IR/ROXICODONE) 5 MG immediate release tablet Take 1 tablet (5 mg total) by mouth every 6 (six) hours as needed for severe pain (pain score 7-10). Must last 30 days. 120 tablet 0   oxyCODONE (OXY IR/ROXICODONE) 5 MG immediate release tablet Take 1 tablet (5 mg total) by mouth every 6 (six) hours as needed for severe pain (pain score 7-10). Must last 30 days. 120 tablet 0   [START ON 01/18/2024] oxyCODONE (OXY IR/ROXICODONE) 5 MG immediate release tablet Take 1 tablet (5 mg total) by mouth every 6 (six) hours as needed for severe pain (pain score 7-10). Must last 30 days. 120 tablet 0   pravastatin (PRAVACHOL) 20 MG tablet Take 1 tablet by mouth daily.     pregabalin (LYRICA) 50 MG capsule Take 50 mg by mouth 2 (two) times daily.     primidone (MYSOLINE) 50 MG tablet Take 50 mg by mouth 2 (two) times daily. 2 tablets twice per day     propranolol (INDERAL) 40 MG tablet Take by mouth.     sitaGLIPtin (JANUVIA) 100 MG tablet Take 1 tablet by mouth daily.     No current facility-administered  medications for this visit.    OBJECTIVE: Vitals:   12/27/23 1046  BP: 110/60  Pulse: 63  Resp: 16  Temp: 97.8 F (36.6 C)  SpO2: 97%     Body mass index is 39.65 kg/m.      General: Well-developed, well-nourished, no acute distress. Eyes: Pink conjunctiva, anicteric sclera. HEENT: Normocephalic, moist mucous membranes, clear oropharnyx. Lungs: Clear to auscultation bilaterally. Heart: Regular rate and rhythm. No rubs, murmurs, or gallops. Abdomen: Soft, nontender, nondistended. No organomegaly noted, normoactive bowel sounds. Musculoskeletal: No edema, cyanosis, or clubbing. Neuro: Alert, answering all questions appropriately.  Cranial nerves grossly intact. Skin: No rashes or petechiae noted. Psych: Normal affect. Lymphatics: No cervical, calvicular, axillary or inguinal LAD.   LAB RESULTS:  Lab Results  Component Value Date   NA 138 10/01/2022   K 3.4 (L) 10/01/2022   CL 103 10/01/2022   CO2 24 10/01/2022   GLUCOSE 357 (H) 10/01/2022   BUN 19 10/01/2022   CREATININE 0.59 10/01/2022   CALCIUM 9.7 10/01/2022   PROT 9.0 (H) 10/01/2022   ALBUMIN 4.0 10/01/2022   AST 18 10/01/2022   ALT 14 10/01/2022   ALKPHOS 91 10/01/2022   BILITOT 0.7 10/01/2022   GFRNONAA >60 10/01/2022   GFRAA >60 09/25/2019    Lab Results  Component Value Date   WBC 7.0 12/27/2023   NEUTROABS 5.7 12/27/2023   HGB 15.1 (H) 12/27/2023   HCT 46.5 (H) 12/27/2023   MCV 87.6 12/27/2023   PLT 222 12/27/2023    Lab Results  Component Value Date   TIBC 384 12/27/2023   FERRITIN 38 12/27/2023   IRONPCTSAT 32 (H) 12/27/2023     STUDIES: No results found.  ASSESSMENT AND PLAN:   Lorrinda Ramstad is a 53 y.o. female with pmh of multiple sclerosis, diabetes, GERD, iron deficiency anemia, chronic pain syndrome referred to hematology for management of iron deficiency anemia.  # Iron deficiency anemia -Reports longstanding history.  Of unknown etiology.  Prior history of iron infusions.   Not responding to oral iron -Completed IV venofer x 5 doses in Jan 2025. Hb improved from 10.7 to 15. Ferritin 5 to 38. No IV venofer today  -Not interested in colonoscopy. Reports has cologuard kit at home and will mail the test. She is open to upper endoscopy. Referral to GI.   # Borderline low vitamin B12 -B12 244.  Continue with B12 supplements for now. -B12 pending from today   #Multiple sclerosis  - Follows with [facility]  Orders Placed This Encounter  Procedures   CBC with Differential (Cancer Center Only)   Ferritin   Iron and TIBC([facility])   Ambulatory referral to Gastroenterology   RTC in 6 months for MD visit, labs, Venofer  Patient expressed understanding and was in agreement with this plan. She also understands that She can call clinic at any time with any questions, concerns, or complaints.   I spent a total of 45 minutes reviewing chart data, face-to-face evaluation with the patient, counseling and coordination of care as detailed above.  Michaelyn Barter, MD   12/27/2023 11:47 AM

## 2023-12-30 ENCOUNTER — Telehealth: Payer: Self-pay

## 2023-12-30 ENCOUNTER — Encounter: Payer: Self-pay | Admitting: Internal Medicine

## 2023-12-30 NOTE — Telephone Encounter (Signed)
 Called and spoke with patient about her B12 injections, Shannon Benitez our scheduler went ahead and scheduled her for Friday 01/03/2024, at 1:45 pm. Patient understood and agreed with this plan.

## 2023-12-30 NOTE — Addendum Note (Signed)
 Addended byMichaelyn Barter on: 12/30/2023 12:34 PM   Modules accepted: Orders

## 2024-01-03 ENCOUNTER — Inpatient Hospital Stay: Payer: MEDICAID

## 2024-01-03 DIAGNOSIS — D509 Iron deficiency anemia, unspecified: Secondary | ICD-10-CM

## 2024-01-03 MED ORDER — CYANOCOBALAMIN 1000 MCG/ML IJ SOLN
1000.0000 ug | Freq: Once | INTRAMUSCULAR | Status: AC
  Administered 2024-01-03: 1000 ug via INTRAMUSCULAR
  Filled 2024-01-03: qty 1

## 2024-01-16 ENCOUNTER — Other Ambulatory Visit: Payer: Self-pay | Admitting: Medical

## 2024-01-16 DIAGNOSIS — G35 Multiple sclerosis: Secondary | ICD-10-CM

## 2024-01-25 ENCOUNTER — Other Ambulatory Visit: Payer: MEDICAID

## 2024-01-29 ENCOUNTER — Telehealth: Payer: Self-pay | Admitting: *Deleted

## 2024-01-29 NOTE — Telephone Encounter (Signed)
 She wants to have her b12 shot from 3/29 to 3/30 and I said it was ok. However the scans are on sat. And we are not open on sat. So she will have to keep the 3/29. I left this message to her just now. She can call back if she needs to change it for another day

## 2024-01-31 ENCOUNTER — Inpatient Hospital Stay: Payer: MEDICAID | Attending: Internal Medicine

## 2024-01-31 DIAGNOSIS — D509 Iron deficiency anemia, unspecified: Secondary | ICD-10-CM | POA: Insufficient documentation

## 2024-01-31 DIAGNOSIS — Z79899 Other long term (current) drug therapy: Secondary | ICD-10-CM | POA: Diagnosis not present

## 2024-01-31 DIAGNOSIS — E538 Deficiency of other specified B group vitamins: Secondary | ICD-10-CM | POA: Diagnosis not present

## 2024-01-31 MED ORDER — CYANOCOBALAMIN 1000 MCG/ML IJ SOLN
1000.0000 ug | Freq: Once | INTRAMUSCULAR | Status: AC
Start: 2024-01-31 — End: 2024-01-31
  Administered 2024-01-31: 1000 ug via INTRAMUSCULAR
  Filled 2024-01-31: qty 1

## 2024-01-31 NOTE — Progress Notes (Signed)
 Patient is still having fatigue and trouble sleeping, which is due to urination, and just can't sleep.

## 2024-02-01 ENCOUNTER — Ambulatory Visit
Admission: RE | Admit: 2024-02-01 | Discharge: 2024-02-01 | Disposition: A | Discharge status: Home / Self Care | Payer: MEDICAID | Source: Ambulatory Visit | Attending: Medical | Admitting: Medical

## 2024-02-01 DIAGNOSIS — G35 Multiple sclerosis: Secondary | ICD-10-CM | POA: Diagnosis present

## 2024-02-06 ENCOUNTER — Telehealth: Payer: Self-pay | Admitting: *Deleted

## 2024-02-06 NOTE — Telephone Encounter (Signed)
 I called the patient to speak to her and she was not answering and then it said voice mail  is full .

## 2024-02-07 ENCOUNTER — Telehealth: Payer: Self-pay | Admitting: *Deleted

## 2024-02-07 NOTE — Telephone Encounter (Signed)
 The patient called today to get answer. Again I called her and voicemail full. The last iv iron was 11/14/2023. But if she was to get iv iron close to the day of MRI just makes the liver looks full because iv iron sits there for several days but you can still get the MRI.

## 2024-02-11 NOTE — Patient Instructions (Signed)
 ______________________________________________________________________    New Medication Management Provider - Randalyn Rhea, NP  Purpose: To inform patients of the addition of a new member to our group, Randalyn Rhea, NP.  Applies to: All patients receiving prescriptions from our practice (written or electronic).  Announcement: We are happy to announce the addition of Randalyn Rhea, NP to or practice ([facility]).  She will take up a support role assisting our interventional pain specialists in the management of our patients.  She will be primarily assigned to the medication management portion of our practice.  Physician assignment: Patient will continue to be assigned to their current Pain Specialist Physician however, Ms. Allena Katz, NP will take over the Medication Management visits along with the responsibilities associated with such visits.  Medication Management: Any questions or inquiries associated with the day-to-day management of your pain medications, refills, or anything else associated with those prescriptions should be directed to Ms. Randalyn Rhea, NP.  Interventional Therapies: All issues associated with these therapies will continue to be managed by your Primary Pain Specialist.   Requesting appointments: When requesting that appointment, please make sure to specify whether the appointment is for Medication Management (to be scheduled with Ms. Allena Katz, NP) or if an evaluation is required/desired with your Primary Pain Specialist.  (Last updated: 02/04/2024) ______________________________________________________________________     ______________________________________________________________________    Procedure instructions  Stop blood-thinners  Do not eat or drink fluids (other than water) for 6 hours before your procedure  No water for 2 hours before your procedure  Take your blood pressure medicine with a sip of  water  Arrive 30 minutes before your appointment  If sedation is planned, bring suitable driver. Pennie Banter, Benedetto Goad, & public transportation are NOT APPROVED)  Carefully read the "Preparing for your procedure" detailed instructions  If you have questions call us at [phone]  Procedure appointments are for procedures only. NO medication refills or new problem evaluations.   ______________________________________________________________________      ______________________________________________________________________    Preparing for your procedure  Appointments: If you think you may not be able to keep your appointment, call 24-48 hours in advance to cancel. We need time to make it available to others.  Procedure visits are for procedures only. During your procedure appointment there will be: NO Prescription Refills*. NO medication changes or discussions*. NO discussion of disability issues*. NO unrelated pain problem evaluations*. NO evaluations to order other pain procedures*. *These will be addressed at a separate and distinct evaluation encounter on the provider's evaluation schedule and not during procedure days.  Instructions: Food intake: Avoid eating anything solid for at least 8 hours prior to your procedure. Clear liquid intake: You may take clear liquids such as water up to 2 hours prior to your procedure. (No carbonated drinks. No soda.) Transportation: Unless otherwise stated by your physician, bring a driver. (Driver cannot be a Market researcher, Pharmacist, community, or any other form of public transportation.) Morning Medicines: Except for blood thinners, take all of your other morning medications with a sip of water. Make sure to take your heart and blood pressure medicines. If your blood pressure's lower number is above 100, the case will be rescheduled. Blood thinners: Make sure to stop your blood thinners as instructed.  If you take a blood thinner, but were not instructed to stop it, call  our office [phone] and ask to talk to a nurse. Not stopping a blood thinner prior to certain procedures could lead to serious complications. Diabetics on  insulin: Notify the staff so that you can be scheduled 1st case in the morning. If your diabetes requires high dose insulin, take only  of your normal insulin dose the morning of the procedure and notify the staff that you have done so. Preventing infections: Shower with an antibacterial soap the morning of your procedure.  Build-up your immune system: Take 1000 mg of Vitamin C with every meal (3 times a day) the day prior to your procedure. Antibiotics: Inform the nursing staff if you are taking any antibiotics or if you have any conditions that may require antibiotics prior to procedures. (Example: recent joint implants)   Pregnancy: If you are pregnant make sure to notify the nursing staff. Not doing so may result in injury to the fetus, including death.  Sickness: If you have a cold, fever, or any active infections, call and cancel or reschedule your procedure. Receiving steroids while having an infection may result in complications. Arrival: You must be in the facility at least 30 minutes prior to your scheduled procedure. Tardiness: Your scheduled time is also the cutoff time. If you do not arrive at least 15 minutes prior to your procedure, you will be rescheduled.  Children: Do not bring any children with you. Make arrangements to keep them home. Dress appropriately: There is always a possibility that your clothing may get soiled. Avoid long dresses. Valuables: Do not bring any jewelry or valuables.  Reasons to call and reschedule or cancel your procedure: (Following these recommendations will minimize the risk of a serious complication.) Surgeries: Avoid having procedures within 2 weeks of any surgery. (Avoid for 2 weeks before or after any surgery). Flu Shots: Avoid having procedures within 2 weeks of a flu shots or . (Avoid for 2  weeks before or after immunizations). Barium: Avoid having a procedure within 7-10 days after having had a radiological study involving the use of radiological contrast. (Myelograms, Barium swallow or enema study). Heart attacks: Avoid any elective procedures or surgeries for the initial 6 months after a "Myocardial Infarction" (Heart Attack). Blood thinners: It is imperative that you stop these medications before procedures. Let us know if you if you take any blood thinner.  Infection: Avoid procedures during or within two weeks of an infection (including chest colds or gastrointestinal problems). Symptoms associated with infections include: Localized redness, fever, chills, night sweats or profuse sweating, burning sensation when voiding, cough, congestion, stuffiness, runny nose, sore throat, diarrhea, nausea, vomiting, cold or Flu symptoms, recent or current infections. It is specially important if the infection is over the area that we intend to treat. Heart and lung problems: Symptoms that may suggest an active cardiopulmonary problem include: cough, chest pain, breathing difficulties or shortness of breath, dizziness, ankle swelling, uncontrolled high or unusually low blood pressure, and/or palpitations. If you are experiencing any of these symptoms, cancel your procedure and contact your primary care physician for an evaluation.  Remember:  Regular Business hours are:  Monday to Thursday 8:00 AM to 4:00 PM  Provider's Schedule: Delano Metz, MD:  Procedure days: Tuesday and Thursday 7:30 AM to 4:00 PM  Edward Jolly, MD:  Procedure days: Monday and Wednesday 7:30 AM to 4:00 PM Last  Updated: 10/15/2023 ______________________________________________________________________      ______________________________________________________________________    General Risks and Possible Complications  Patient Responsibilities: It is important that you read this as it is part of your  informed consent. It is our duty to inform you of the risks and possible complications associated with  treatments offered to you. It is your responsibility as a patient to read this and to ask questions about anything that is not clear or that you believe was not covered in this document.  Patient's Rights: You have the right to refuse treatment. You also have the right to change your mind, even after initially having agreed to have the treatment done. However, under this last option, if you wait until the last second to change your mind, you may be charged for the materials used up to that point.  Introduction: Medicine is not an Visual merchandiser. Everything in Medicine, including the lack of treatment(s), carries the potential for danger, harm, or loss (which is by definition: Risk). In Medicine, a complication is a secondary problem, condition, or disease that can aggravate an already existing one. All treatments carry the risk of possible complications. The fact that a side effects or complications occurs, does not imply that the treatment was conducted incorrectly. It must be clearly understood that these can happen even when everything is done following the highest safety standards.  No treatment: You can choose not to proceed with the proposed treatment alternative. The "PRO(s)" would include: avoiding the risk of complications associated with the therapy. The "CON(s)" would include: not getting any of the treatment benefits. These benefits fall under one of three categories: diagnostic; therapeutic; and/or palliative. Diagnostic benefits include: getting information which can ultimately lead to improvement of the disease or symptom(s). Therapeutic benefits are those associated with the successful treatment of the disease. Finally, palliative benefits are those related to the decrease of the primary symptoms, without necessarily curing the condition (example: decreasing the pain from a flare-up of a  chronic condition, such as incurable terminal cancer).  General Risks and Complications: These are associated to most interventional treatments. They can occur alone, or in combination. They fall under one of the following six (6) categories: no benefit or worsening of symptoms; bleeding; infection; nerve damage; allergic reactions; and/or death. No benefits or worsening of symptoms: In Medicine there are no guarantees, only probabilities. No healthcare provider can ever guarantee that a medical treatment will work, they can only state the probability that it may. Furthermore, there is always the possibility that the condition may worsen, either directly, or indirectly, as a consequence of the treatment. Bleeding: This is more common if the patient is taking a blood thinner, either prescription or over the counter (example: Goody Powders, Fish oil, Aspirin, Garlic, etc.), or if suffering a condition associated with impaired coagulation (example: Hemophilia, cirrhosis of the liver, low platelet counts, etc.). However, even if you do not have one on these, it can still happen. If you have any of these conditions, or take one of these drugs, make sure to notify your treating physician. Infection: This is more common in patients with a compromised immune system, either due to disease (example: diabetes, cancer, human immunodeficiency virus [HIV], etc.), or due to medications or treatments (example: therapies used to treat cancer and rheumatological diseases). However, even if you do not have one on these, it can still happen. If you have any of these conditions, or take one of these drugs, make sure to notify your treating physician. Nerve Damage: This is more common when the treatment is an invasive one, but it can also happen with the use of medications, such as those used in the treatment of cancer. The damage can occur to small secondary nerves, or to large primary ones, such as those in the spinal cord  and  brain. This damage may be temporary or permanent and it may lead to impairments that can range from temporary numbness to permanent paralysis and/or brain death. Allergic Reactions: Any time a substance or material comes in contact with our body, there is the possibility of an allergic reaction. These can range from a mild skin rash (contact dermatitis) to a severe systemic reaction (anaphylactic reaction), which can result in death. Death: In general, any medical intervention can result in death, most of the time due to an unforeseen complication. ______________________________________________________________________       ______________________________________________________________________    Opioid Pain Medication Update  To: All patients taking opioid pain medications. (I.e.: hydrocodone, hydromorphone, oxycodone, oxymorphone, morphine, codeine, methadone, tapentadol, tramadol, buprenorphine, fentanyl, etc.)  Re: Updated review of side effects and adverse reactions of opioid analgesics, as well as new information about long term effects of this class of medications.  Direct risks of long-term opioid therapy are not limited to opioid addiction and overdose. Potential medical risks include serious fractures, breathing problems during sleep, hyperalgesia, immunosuppression, chronic constipation, bowel obstruction, myocardial infarction, and tooth decay secondary to xerostomia.  Unpredictable adverse effects that can occur even if you take your medication correctly: Cognitive impairment, respiratory depression, and death. Most people think that if they take their medication "correctly", and "as instructed", that they will be safe. Nothing could be farther from the truth. In reality, a significant amount of recorded deaths associated with the use of opioids has occurred in individuals that had taken the medication for a long time, and were taking their medication correctly. The following are  examples of how this can happen: Patient taking his/her medication for a long time, as instructed, without any side effects, is given a certain antibiotic or another unrelated medication, which in turn triggers a "Drug-to-drug interaction" leading to disorientation, cognitive impairment, impaired reflexes, respiratory depression or an untoward event leading to serious bodily harm or injury, including death.  Patient taking his/her medication for a long time, as instructed, without any side effects, develops an acute impairment of liver and/or kidney function. This will lead to a rapid inability of the body to breakdown and eliminate their pain medication, which will result in effects similar to an "overdose", but with the same medicine and dose that they had always taken. This again may lead to disorientation, cognitive impairment, impaired reflexes, respiratory depression or an untoward event leading to serious bodily harm or injury, including death.  A similar problem will occur with patients as they grow older and their liver and kidney function begins to decrease as part of the aging process.  Background information: Historically, the original case for using long-term opioid therapy to treat chronic noncancer pain was based on safety assumptions that subsequent experience has called into question. In 1996, the American Pain Society and the American Academy of Pain Medicine issued a consensus statement supporting long-term opioid therapy. This statement acknowledged the dangers of opioid prescribing but concluded that the risk for addiction was low; respiratory depression induced by opioids was short-lived, occurred mainly in opioid-naive patients, and was antagonized by pain; tolerance was not a common problem; and efforts to control diversion should not constrain opioid prescribing. This has now proven to be wrong. Experience regarding the risks for opioid addiction, misuse, and overdose in community  practice has failed to support these assumptions.  According to the Centers for Disease Control and Prevention, fatal overdoses involving opioid analgesics have increased sharply over the past decade. Currently, more than 96,700  people die from drug overdoses every year. Opioids are a factor in 7 out of every 10 overdose deaths. Deaths from drug overdose have surpassed motor vehicle accidents as the leading cause of death for individuals between the ages of 74 and 78.  Clinical data suggest that neuroendocrine dysfunction may be very common in both men and women, potentially causing hypogonadism, erectile dysfunction, infertility, decreased libido, osteoporosis, and depression. Recent studies linked higher opioid dose to increased opioid-related mortality. Controlled observational studies reported that long-term opioid therapy may be associated with increased risk for cardiovascular events. Subsequent meta-analysis concluded that the safety of long-term opioid therapy in elderly patients has not been proven.   Side Effects and adverse reactions: Common side effects: Drowsiness (sedation). Dizziness. Nausea and vomiting. Constipation. Physical dependence -- Dependence often manifests with withdrawal symptoms when opioids are discontinued or decreased. Tolerance -- As you take repeated doses of opioids, you require increased medication to experience the same effect of pain relief. Respiratory depression -- This can occur in healthy people, especially with higher doses. However, people with COPD, asthma or other lung conditions may be even more susceptible to fatal respiratory impairment.  Uncommon side effects: An increased sensitivity to feeling pain and extreme response to pain (hyperalgesia). Chronic use of opioids can lead to this. Delayed gastric emptying (the process by which the contents of your stomach are moved into your small intestine). Muscle rigidity. Immune system and hormonal  dysfunction. Quick, involuntary muscle jerks (myoclonus). Arrhythmia. Itchy skin (pruritus). Dry mouth (xerostomia).  Long-term side effects: Chronic constipation. Sleep-disordered breathing (SDB). Increased risk of bone fractures. Hypothalamic-pituitary-adrenal dysregulation. Increased risk of overdose.  RISKS: Respiratory depression and death: Opioids increase the risk of respiratory depression and death.  Drug-to-drug interactions: Opioids are relatively contraindicated in combination with benzodiazepines, sleep inducers, and other central nervous system depressants. Other classes of medications (i.e.: certain antibiotics and even over-the-counter medications) may also trigger or induce respiratory depression in some patients.  Medical conditions: Patients with pre-existing respiratory problems are at higher risk of respiratory failure and/or depression when in combination with opioid analgesics. Opioids are relatively contraindicated in some medical conditions such as central sleep apnea.   Fractures and Falls:  Opioids increase the risk and incidence of falls. This is of particular importance in elderly patients.  Endocrine System:  Long-term administration is associated with endocrine abnormalities (endocrinopathies). (Also known as Opioid-induced Endocrinopathy) Influences on both the hypothalamic-pituitary-adrenal axis?and the hypothalamic-pituitary-gonadal axis have been demonstrated with consequent hypogonadism and adrenal insufficiency in both sexes. Hypogonadism and decreased levels of dehydroepiandrosterone sulfate have been reported in men and women. Endocrine effects include: Amenorrhoea in women (abnormal absence of menstruation) Reduced libido in both sexes Decreased sexual function Erectile dysfunction in men Hypogonadisms (decreased testicular function with shrinkage of testicles) Infertility Depression and fatigue Loss of muscle mass Anxiety Depression Immune  suppression Hyperalgesia Weight gain Anemia Osteoporosis Patients (particularly women of childbearing age) should avoid opioids. There is insufficient evidence to recommend routine monitoring of asymptomatic patients taking opioids in the long-term for hormonal deficiencies.  Immune System: Human studies have demonstrated that opioids have an immunomodulating effect. These effects are mediated via opioid receptors both on immune effector cells and in the central nervous system. Opioids have been demonstrated to have adverse effects on antimicrobial response and anti-tumour surveillance. Buprenorphine has been demonstrated to have no impact on immune function.  Opioid Induced Hyperalgesia: Human studies have demonstrated that prolonged use of opioids can lead to a state of abnormal pain sensitivity, sometimes  called opioid induced hyperalgesia (OIH). Opioid induced hyperalgesia is not usually seen in the absence of tolerance to opioid analgesia. Clinically, hyperalgesia may be diagnosed if the patient on long-term opioid therapy presents with increased pain. This might be qualitatively and anatomically distinct from pain related to disease progression or to breakthrough pain resulting from development of opioid tolerance. Pain associated with hyperalgesia tends to be more diffuse than the pre-existing pain and less defined in quality. Management of opioid induced hyperalgesia requires opioid dose reduction.  Cancer: Chronic opioid therapy has been associated with an increased risk of cancer among noncancer patients with chronic pain. This association was more evident in chronic strong opioid users. Chronic opioid consumption causes significant pathological changes in the small intestine and colon. Epidemiological studies have found that there is a link between opium dependence and initiation of gastrointestinal cancers. Cancer is the second leading cause of death after cardiovascular disease.  Chronic use of opioids can cause multiple conditions such as GERD, immunosuppression and renal damage as well as carcinogenic effects, which are associated with the incidence of cancers.   Mortality: Long-term opioid use has been associated with increased mortality among patients with chronic non-cancer pain (CNCP).  Prescription of long-acting opioids for chronic noncancer pain was associated with a significantly increased risk of all-cause mortality, including deaths from causes other than overdose.  Reference: Von Korff M, Kolodny A, Deyo RA, Chou R. Long-term opioid therapy reconsidered. Ann Intern Med. 2011 Sep 6;155(5):325-8. doi: 10.7326/[id number]. PMID: 16109604; PMCID: [id number]. Randon Goldsmith, Hayward RA, Dunn KM, [place] KP. Risk of adverse events in patients prescribed long-term opioids: A cohort study in the [place] [facility] [employer]. Eur J Pain. 2019 May;23(5):908-922. doi: 10.1002/ejp.1357. Epub 2019 Jan 31. PMID: 78295621. Colameco S, Coren JS, Ciervo CA. Continuous opioid treatment for chronic noncancer pain: a time for moderation in prescribing. Postgrad Med. 2009 Jul;121(4):61-6. doi: 10.3810/pgm.2009.07.2032. PMID: 30865784. William Hamburger RN, Colton SD, Blazina I, Cristopher Peru, Bougatsos C, Deyo RA. The effectiveness and risks of long-term opioid therapy for chronic pain: a systematic review for a [employer]ways to [employer]. Ann Intern Med. 2015 Feb 17;162(4):276-86. doi: 10.7326/M14-2559. PMID: 69629528. Caryl Bis [facility], Makuc DM. NCHS Data Brief No. 22. [place]: Centers for Disease Control and Prevention; 2009. Sep, Increase in Fatal Poisonings Involving Opioid Analgesics in the [place], 1999-2006. Song IA, Choi HR, Oh TK. Long-term opioid use and mortality in patients with chronic non-cancer pain: Ten-year follow-up study in [place] from 2010 through 2019.  EClinicalMedicine. 2022 Jul 18;51:101558. doi: 10.1016/j.eclinm.2022.413244. PMID: 01027253; PMCID: [id number]. Huser, W., Schubert, T., Vogelmann, T. et al. All-cause mortality in patients with long-term opioid therapy compared with non-opioid analgesics for chronic non-cancer pain: a database study. BMC Med 18, 162 (2020). http://lester.info/ Rashidian H, Karie Kirks, Malekzadeh R, Haghdoost AA. An Ecological Study of the Association between Opiate Use and Incidence of Cancers. Addict Health. 2016 Fall;8(4):252-260. PMID: 59563875; PMCID: [id number].  Our Goal: Our goal is to control your pain with means other than the use of opioid pain medications.  Our Recommendation: Talk to your physician about coming off of these medications. We can assist you with the tapering down and stopping these medicines. Based on the new information, even if you cannot completely stop the medication, a decrease in the dose may be associated with a lesser risk. Ask for other means of controlling the pain. Decrease or eliminate those factors that significantly  contribute to your pain such as smoking, obesity, and a diet heavily tilted towards "inflammatory" nutrients.  Last Updated: 05/13/2023   ______________________________________________________________________       ______________________________________________________________________    National Pain Medication Shortage  The [place] is experiencing worsening drug shortages. These have had a negative widespread effect on patient care and treatment. Not expected to improve any time soon. Predicted to last past 2029.   Drug shortage list (generic names) Oxycodone IR Oxycodone/APAP Oxymorphone IR Hydromorphone Hydrocodone/APAP Morphine  Where is the problem?  Manufacturing and supply level.  Will this shortage affect you?  Only if you take any of the above pain medications.  How? You may be unable to fill your  prescription.  Your pharmacist may offer a "partial fill" of your prescription. (Warning: Do not accept partial fills.) Prescriptions partially filled cannot be transferred to another pharmacy. Read our Medication Rules and Regulation. Depending on how much medicine you are dependent on, you may experience withdrawals when unable to get the medication.  Recommendations: Consider ending your dependence on opioid pain medications. Ask your pain specialist to assist you with the process. Consider switching to a medication currently not in shortage, such as Buprenorphine. Talk to your pain specialist about this option. Consider decreasing your pain medication requirements by managing tolerance thru "Drug Holidays". This may help minimize withdrawals, should you run out of medicine. Control your pain thru the use of non-pharmacological interventional therapies.   Your prescriber: Prescribers cannot be blamed for shortages. Medication manufacturing and supply issues cannot be fixed by the prescriber.   NOTE: The prescriber is not responsible for supplying the medication, or solving supply issues. Work with your pharmacist to solve it. The patient is responsible for the decision to take or continue taking the medication and for identifying and securing a legal supply source. By law, supplying the medication is the job and responsibility of the pharmacy. The prescriber is responsible for the evaluation, monitoring, and prescribing of these medications.   Prescribers will NOT: Re-issue prescriptions that have been partially filled. Re-issue prescriptions already sent to a pharmacy.  Re-send prescriptions to a different pharmacy because yours did not have your medication. Ask pharmacist to order more medicine or transfer the prescription to another pharmacy. (Read below.)  New 2023 regulation: "July 06, 2022 Revised Regulation Allows DEA-Registered Pharmacies to Transfer Electronic Prescriptions at  a Patient's Request DEA Headquarters Division - Public Information Office Patients now have the ability to request their electronic prescription be transferred to another pharmacy without having to go back to their practitioner to initiate the request. This revised regulation went into effect on Monday, July 02, 2022.     At a patient's request, a DEA-registered retail pharmacy can now transfer an electronic prescription for a controlled substance (schedules II-V) to another DEA-registered retail pharmacy. Prior to this change, patients would have to go through their practitioner to cancel their prescription and have it re-issued to a different pharmacy. The process was taxing and time consuming for both patients and practitioners.    The Drug Enforcement Administration [facility]) published its intent to revise the process for transferring electronic prescriptions on September 23, 2020.  The final rule was published in the federal register on May 31, 2022 and went into effect 30 days later.  Under the final rule, a prescription can only be transferred once between pharmacies, and only if allowed under existing state or other applicable law. The prescription must remain in its electronic form; may not be altered in  any way; and the transfer must be communicated directly between two licensed pharmacists. It's important to note, any authorized refills transfer with the original prescription, which means the entire prescription will be filled at the same pharmacy".  Reference: [email or web address] [facility] website announcement)  [email or web address] [employer] of [employer])   [employer] / Vol. 88, No. 143 / Thursday, May 31, 2022 / Rules and Regulations DEPARTMENT OF JUSTICE  Drug Enforcement Administration  21 CFR Part 1306  [Docket No. [id number]]   RIN [id number] Transfer of Electronic Prescriptions for Schedules II-V Controlled Substances Between Pharmacies for Initial Filling  ______________________________________________________________________       ______________________________________________________________________    Transfer of Pain Medication between Pharmacies  Re: 2023 DEA Clarification on existing regulation  Published on DEA Website: July 06, 2022  Title: Revised Regulation Allows DEA-Registered Pharmacies to Electrical engineer Prescriptions at a Patient's Request DEA Headquarters Division - [employer]  "Patients now have the ability to request their electronic prescription be transferred to another pharmacy without having to go back to their practitioner to initiate the request. This revised regulation went into effect on Monday, July 02, 2022.     At a patient's request, a DEA-registered retail pharmacy can now transfer an electronic prescription for a controlled substance (schedules II-V) to another DEA-registered retail pharmacy. Prior to this change, patients would have to go through their practitioner to cancel their prescription and have it re-issued to a different pharmacy. The process was taxing and time consuming for both patients and practitioners.    The Drug Enforcement Administration [facility]) published its intent to revise the process for transferring electronic prescriptions on September 23, 2020.  The final rule was published in the federal register on May 31, 2022 and went into effect 30 days later.  Under the final rule, a prescription can only be transferred once between pharmacies, and only if allowed under existing state or other applicable law. The prescription must remain in its electronic form; may not be altered in any way; and the transfer must be communicated directly between two licensed pharmacists. It's important to note, any authorized refills transfer with the original  prescription, which means the entire prescription will be filled at the same pharmacy."    REFERENCES: 1. DEA website announcement [email or web address]  2. Department of Justice website  [email or web address]  3. DEPARTMENT OF JUSTICE Drug Enforcement Administration 21 CFR Part 1306 [Docket No. [id number]] RIN [id number] "Transfer of Electronic Prescriptions for Schedules II-V Controlled Substances Between Pharmacies for Initial Filling"  ______________________________________________________________________       ______________________________________________________________________    Medication Rules  Purpose: To inform patients, and their family members, of our medication rules and regulations.  Applies to: All patients receiving prescriptions from our practice (written or electronic).  Pharmacy of record: This is the pharmacy where your electronic prescriptions will be sent. Make sure we have the correct one.  Electronic prescriptions: In compliance with the [facility] Strengthen Opioid Misuse Prevention (STOP) Act of 2017 (Session Conni Elliot [phone]), effective November 05, 2018, all controlled substances must be electronically prescribed. Written prescriptions, faxing, or calling prescriptions to a pharmacy will no longer be done.  Prescription refills: These will be provided only during in-person appointments. No medications will be renewed without a "face-to-face" evaluation with your provider. Applies to all prescriptions.  NOTE: The following applies primarily to controlled substances (Opioid* Pain Medications).   Type of encounter (visit): For patients receiving controlled substances, face-to-face visits are required. (Not an option and not up  to the patient.)  Patient's Responsibilities: Pain Pills: Bring all pain pills to every  appointment (except for procedure appointments). Pill counts are required.  Pill Bottles: Bring pills in original pharmacy bottle. Bring bottle, even if empty. Always bring the bottle of the most recent fill.  Medication refills: You are responsible for knowing and keeping track of what medications you are taking and when is it that you will need a refill. The day before your appointment: write a list of all prescriptions that need to be refilled. The day of the appointment: give the list to the admitting nurse. Prescriptions will be written only during appointments. No prescriptions will be written on procedure days. If you forget a medication: it will not be "Called in", "Faxed", or "electronically sent". You will need to get another appointment to get these prescribed. No early refills. Do not call asking to have your prescription filled early. Partial  or short prescriptions: Occasionally your pharmacy may not have enough pills to fill your prescription.  NEVER ACCEPT a partial fill or a prescription that is short of the total amount of pills that you were prescribed.  With controlled substances the law allows 72 hours for the pharmacy to complete the prescription.  If the prescription is not completed within 72 hours, the pharmacist will require a new prescription to be written. This means that you will be short on your medicine and we WILL NOT send another prescription to complete your original prescription.  Instead, request the pharmacy to send a carrier to a nearby branch to get enough medication to provide you with your full prescription. Prescription Accuracy: You are responsible for carefully inspecting your prescriptions before leaving our office. Have the discharge nurse carefully go over each prescription with you, before taking them home. Make sure that your name is accurately spelled, that your address is correct. Check the name and dose of your medication to make sure it is accurate. Check  the number of pills, and the written instructions to make sure they are clear and accurate. Make sure that you are given enough medication to last until your next medication refill appointment. Taking Medication: Take medication as prescribed. When it comes to controlled substances, taking less pills or less frequently than prescribed is permitted and encouraged. Never take more pills than instructed. Never take the medication more frequently than prescribed.  Inform other Doctors: Always inform, all of your healthcare providers, of all the medications you take. Pain Medication from other Providers: You are not allowed to accept any additional pain medication from any other Doctor or Healthcare provider. There are two exceptions to this rule. (see below) In the event that you require additional pain medication, you are responsible for notifying us, as stated below. Cough Medicine: Often these contain an opioid, such as codeine or hydrocodone. Never accept or take cough medicine containing these opioids if you are already taking an opioid* medication. The combination may cause respiratory failure and death. Medication Agreement: You are responsible for carefully reading and following our Medication Agreement. This must be signed before receiving any prescriptions from our practice. Safely store a copy of your signed Agreement. Violations to the Agreement will result in no further prescriptions. (Additional copies of our Medication Agreement are available upon request.) Laws, Rules, & Regulations: All patients are expected to follow all [address] and [employer], Rules, [employer]. Ignorance of the Laws does not constitute a valid excuse.  Illegal drugs and Controlled Substances: The use of illegal substances (  including, but not limited to marijuana and its derivatives) and/or the illegal use of any controlled substances is strictly prohibited. Violation of this rule may result in the immediate and  permanent discontinuation of any and all prescriptions being written by our practice. The use of any illegal substances is prohibited. Adopted CDC guidelines & recommendations: Target dosing levels will be at or below 60 MME/day. Use of benzodiazepines** is not recommended. Urine Drug testing: Patients taking controlled substances will be required to provide a urine sample upon request. Do not void before coming to your medication management appointments. Hold emptying your bladder until you are admitted. The admitting nurse will inform you if a sample is required. Our practice reserves the right to call you at any time to provide a sample. Once receiving the call, you have 24 hours to comply with request. Not providing a sample upon request may result in termination of medication therapy.  Exceptions: There are only two exceptions to the rule of not receiving pain medications from other Healthcare Providers. Exception #1 (Emergencies): In the event of an emergency (i.e.: accident requiring emergency care), you are allowed to receive additional pain medication. However, you are responsible for: As soon as you are able, call our office [phone], at any time of the day or night, and leave a message stating your name, the date and nature of the emergency, and the name and dose of the medication prescribed. In the event that your call is answered by a member of our staff, make sure to document and save the date, time, and the name of the person that took your information.  Exception #2 (Planned Surgery): In the event that you are scheduled by another doctor or dentist to have any type of surgery or procedure, you are allowed (for a period no longer than 30 days), to receive additional pain medication, for the acute post-op pain. However, in this case, you are responsible for picking up a copy of our "Post-op Pain Management for Surgeons" handout, and giving it to your surgeon or dentist. This document is  available at our office, and does not require an appointment to obtain it. Simply go to our office during business hours (Monday-Thursday from 8:00 AM to 4:00 PM) (Friday 8:00 AM to 12:00 Noon) or if you have a scheduled appointment with [place], prior to your surgery, and ask for it by name. In addition, you are responsible for: calling our office [phone], at any time of the day or night, and leaving a message stating your name, name of your surgeon, type of surgery, and date of procedure or surgery. Failure to comply with your responsibilities may result in termination of therapy involving the controlled substances.  Consequences:  Non-compliance with the above rules may result in permanent discontinuation of medication prescription therapy. All patients receiving any type of controlled substance is expected to comply with the above patient responsibilities. Not doing so may result in permanent discontinuation of medication prescription therapy. Medication Agreement Violation. Following the above rules, including your responsibilities will help you in avoiding a Medication Agreement Violation ("Breaking your Pain Medication Contract").  *Opioid medications include: morphine, codeine, oxycodone, oxymorphone, hydrocodone, hydromorphone, meperidine, tramadol, tapentadol, buprenorphine, fentanyl, methadone. **Benzodiazepine medications include: diazepam (Valium), alprazolam (Xanax), clonazepam (Klonopine), lorazepam (Ativan), clorazepate (Tranxene), chlordiazepoxide (Librium), estazolam (Prosom), oxazepam (Serax), temazepam (Restoril), triazolam (Halcion) (Last updated: 08/28/2023) ______________________________________________________________________      ______________________________________________________________________    Medication Recommendations and Reminders  Applies to: All patients receiving prescriptions (written and/or

## 2024-02-11 NOTE — Progress Notes (Unsigned)
 PROVIDER NOTE: Interpretation of information contained herein should be left to medically-trained personnel. Specific patient instructions are provided elsewhere under "Patient Instructions" section of medical record. This document was created in part using AI and STT-dictation technology, any transcriptional errors that may result from this process are unintentional.  Patient: Shannon Benitez  Service: E/M   PCP: Jenell Milliner, MD  DOB: 09-28-1971  DOS: 02/12/2024  Provider: Oswaldo Done, MD  MRN: [id number]  Delivery: Face-to-face  Specialty: Interventional Pain Management  Type: Established Patient  Setting: Ambulatory outpatient facility  Specialty designation: 09  Referring Prov.: Jenell Milliner, MD  Location: Outpatient office facility       HPI  Shannon Benitez, a 53 y.o. year old female, is here today because of her No primary diagnosis found.. Shannon Benitez primary complain today is No chief complaint on file.  Pertinent problems: [name] has Multiple sclerosis (HCC); Neurogenic pain; Chronic sacroiliac joint pain (Bilateral) (L>R); Chronic pain syndrome; Chronic low back pain (1ry area of Pain) (Bilateral) (L>R) w/ sciatica (Bilateral); Chronic lower extremity pain (2ry area of Pain) (Bilateral) (R>L); Chronic low back pain (Bilateral) (L>R) w/o sciatica; Muscle spasticity; Spondylosis without myelopathy or radiculopathy, lumbosacral region; Lumbar facet syndrome (Bilateral) (R>L); Abnormal MRI, lumbar spine (04/18/2023); Lumbar facet hypertrophy (Multilevel) (Bilateral); Osteoarthritis of facet joint of lumbar spine; Osteoarthritis of lumbar spine; DDD (degenerative disc disease), lumbosacral; Lumbar spondylosis; Diabetic peripheral neuropathy (HCC); 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); Abnormal MRI, cervical spine (12/27/20); Scapulalgia (Left); Traumatic injury of scapula (Left); Other spondylosis, sacral and sacrococcygeal region; Acute  exacerbation of chronic low back pain; Chronic knee pain (Bilateral); Osteoarthritis of knee (Right); Osteochondroma of proximal fibula (Right); Osteochondroma of proximal tibia (Right); Osteoarthritis involving multiple joints; Abnormal MRI, thoracic spine (12/27/2020); Chronic hip pain (Bilateral); Osteoarthritis of hips (Bilateral); Fibromyalgia; Intractable low back pain; Myofascial pain syndrome of lumbar spine; Demyelinating disease of central nervous system, unspecified (HCC); and Lumbar facet joint pain on their pertinent problem list. Pain Assessment: Severity of   is reported as a  /10. Location:    / . Onset:  . Quality:  . Timing:  . Modifying factor(s):  Marland Kitchen Vitals:  vitals were not taken for this visit.  BMI: Estimated body mass index is 39.65 kg/m as calculated from the following:   Height as of 11/19/23: 5' (1.524 m).   Weight as of 12/27/23: 203 lb (92.1 kg). Last encounter: 11/19/2023. Last procedure: Visit date not found.  Reason for encounter: medication management. ***  Discussed the use of AI scribe software for clinical note transcription with the patient, who gave verbal consent to proceed.  History of Present Illness         RTCB: 05/17/2024   Pharmacotherapy Assessment  Analgesic: Oxycodone IR 5 mg, 1 tab p.o. every 6 hours MME/day: 30 mg/day.   Monitoring: [place] PMP: PDMP reviewed during this encounter.       Pharmacotherapy: No side-effects or adverse reactions reported. Compliance: No problems identified. Effectiveness: Clinically acceptable.  No notes on file  No results found for: "CBDTHCR" No results found for: "D8THCCBX" No results found for: "D9THCCBX"  UDS:  Summary  Date Value Ref Range Status  12/26/2022 Note  Corrected    Comment:    ==================================================================== [employer] Select 13 (MW) ==================================================================== Test                             Result       Flag  Units  Drug Present and Declared for Prescription Verification   Oxycodone                      2390         EXPECTED   ng/mg creat   Noroxycodone                   5988         EXPECTED   ng/mg creat   Noroxymorphone                 61           EXPECTED   ng/mg creat    Sources of oxycodone include scheduled prescription medications.    Noroxycodone and noroxymorphone are expected metabolites of    oxycodone.    Phenobarbital                  PRESENT      EXPECTED    Phenobarbital is an expected metabolite of primidone; Phenobarbital    may also be administered as a prescription drug.  ==================================================================== Test                      Result    Flag   Units      Ref Range   Creatinine              163              mg/dL      >=16 ==================================================================== Declared Medications:  The flagging and interpretation on this report are based on the  following declared medications.  Unexpected results may arise from  inaccuracies in the declared medications.   **Note: The testing scope of this panel includes these medications:   Oxycodone (OxyIR)  Primidone (Mysoline)   **Note: The testing scope of this panel does not include the  following reported medications:   Omeprazole (Prilosec)  Pregabalin (Lyrica)  Propranolol (Inderal)  Sertraline (Zoloft)  Sitagliptin (Januvia) ==================================================================== For clinical consultation, please call [phone]. ====================================================================       ROS  Constitutional: Denies any fever or chills Gastrointestinal: No reported hemesis, hematochezia, vomiting, or acute GI distress Musculoskeletal: Denies any acute onset joint swelling, redness, loss of ROM, or weakness Neurological: No reported episodes of acute onset apraxia, aphasia, dysarthria, agnosia, amnesia,  paralysis, loss of coordination, or loss of consciousness  Medication Review  Cholecalciferol, Dimethyl Fumarate, aspirin-acetaminophen-caffeine, baclofen, lamoTRIgine, levETIRAcetam, loperamide, meloxicam, metFORMIN, mirabegron ER, naloxone, omeprazole, oxyCODONE, pravastatin, pregabalin, primidone, propranolol, and sitaGLIPtin  History Review  Allergy: Ms. Newsham is allergic to 5-alpha reductase inhibitors, desvenlafaxine, glatiramer, rebif [interferon beta-1a], gramineae pollens, latex, and tramadol. Drug: Ms. Heward  has no history on file for drug use. Alcohol:  reports no history of alcohol use. Tobacco:  reports that she quit smoking about 7 years ago. Her smoking use included cigarettes. She started smoking about 25 years ago. She has a 9 pack-year smoking history. She has quit using smokeless tobacco. Social: Ms. Blecher  reports that she quit smoking about 7 years ago. Her smoking use included cigarettes. She started smoking about 25 years ago. She has a 9 pack-year smoking history. She has quit using smokeless tobacco. She reports that she does not drink alcohol. Medical:  has a past medical history of Acute postoperative pain (04/30/2019), Diabetes mellitus without complication (HCC), GERD (gastroesophageal reflux disease), Headache, MS (multiple sclerosis) (HCC), Numbness and tingling of both legs below  knees, Osteoarthritis, Vertigo, and Wears dentures. Surgical: Ms. Marlowe  has a past surgical history that includes Abdominal hysterectomy; Cesarean section; Tumor removal (Left); Cataract extraction w/PHACO (Right, 03/18/2019); and Cataract extraction w/PHACO (Left, 04/13/2019). Family: family history is not on file. She was adopted.  Laboratory Chemistry Profile   Renal Lab Results  Component Value Date   BUN 19 10/01/2022   CREATININE 0.59 10/01/2022   BCR 13 11/27/2018   GFRAA >60 09/25/2019   GFRNONAA >60 10/01/2022    Hepatic Lab Results  Component Value  Date   AST 18 10/01/2022   ALT 14 10/01/2022   ALBUMIN 4.0 10/01/2022   ALKPHOS 91 10/01/2022    Electrolytes Lab Results  Component Value Date   NA 138 10/01/2022   K 3.4 (L) 10/01/2022   CL 103 10/01/2022   CALCIUM 9.7 10/01/2022   MG 2.2 11/27/2018    Bone Lab Results  Component Value Date   25OHVITD1 55 11/27/2018   25OHVITD2 <1.0 11/27/2018   25OHVITD3 55 11/27/2018    Inflammation (CRP: Acute Phase) (ESR: Chronic Phase) Lab Results  Component Value Date   CRP 10 11/27/2018   ESRSEDRATE 42 (H) 11/27/2018         Note: Above Lab results reviewed.  Recent Imaging Review  MR LUMBAR SPINE WO CONTRAST CLINICAL DATA:  Low back pain for over 6 weeks  EXAM: MRI LUMBAR SPINE WITHOUT CONTRAST  TECHNIQUE: Multiplanar, multisequence MR imaging of the lumbar spine was performed. No intravenous contrast was administered.  COMPARISON:  09/22/2020  FINDINGS: Segmentation:  Standard.  Alignment:  2 mm retrolisthesis of L5 on S1.  Vertebrae: No acute fracture, evidence of discitis, or aggressive bone lesion.  Conus medullaris and cauda equina: Conus extends to the L2 level. Conus and cauda equina appear normal.  Paraspinal and other soft tissues: No acute paraspinal abnormality.  Disc levels:  Disc spaces: Disc desiccation at L3-4, L4-5 and L5-S1.  T12-L1: No significant disc bulge. No neural foraminal stenosis. No central canal stenosis.  L1-L2: No significant disc bulge. Mild bilateral facet arthropathy. No foraminal or central canal stenosis.  L2-L3: No significant disc bulge. Mild bilateral facet arthropathy. No foraminal or central canal stenosis.  L3-L4: No significant disc bulge. Mild bilateral facet arthropathy. No foraminal or central canal stenosis.  L4-L5: Mild broad-based disc bulge with a small shallow central disc protrusion. Mild bilateral facet arthropathy. No foraminal or central canal stenosis.  L5-S1: Mild broad-based disc bulge. Mild  bilateral facet arthropathy. No foraminal or central canal stenosis.  IMPRESSION: 1. Mild lumbar spine spondylosis as described above without significant interval change compared with 09/22/2020. 2. No acute osseous injury of the lumbar spine.  Electronically Signed   By: Elige Ko M.D.   On: 04/27/2023 07:23 Note: Reviewed        Physical Exam  General appearance: Well nourished, well developed, and well hydrated. In no apparent acute distress Mental status: Alert, oriented x 3 (person, place, & time)       Respiratory: No evidence of acute respiratory distress Eyes: PERLA Vitals: There were no vitals taken for this visit. BMI: Estimated body mass index is 39.65 kg/m as calculated from the following:   Height as of 11/19/23: 5' (1.524 m).   Weight as of 12/27/23: 203 lb (92.1 kg). Ideal: Patient weight not recorded  Assessment   Diagnosis Status  1. Chronic low back pain (1ry area of Pain) (Bilateral) (L>R) w/ sciatica (Bilateral)   2. Chronic lower extremity pain (2ry  area of Pain) (Bilateral) (R>L)   3. Multiple sclerosis (HCC)   4. Chronic sacroiliac joint pain (Bilateral) (L>R)   5. Lumbar facet joint pain   6. Lumbar facet syndrome (Bilateral) (R>L)   7. Chronic hip pain (Bilateral)   8. Chronic knee pain (Bilateral)   9. Chronic pain syndrome   10. Pharmacologic therapy   11. Chronic use of opiate for therapeutic purpose   12. Encounter for medication management   13. Encounter for chronic pain management    Controlled Controlled Controlled   Updated Problems: No problems updated.  Plan of Care  Problem-specific:  Assessment and Plan            Ms. Philena Obey has a current medication list which includes the following long-term medication(s): baclofen, lamotrigine, levetiracetam, meloxicam, metformin, naloxone, omeprazole, oxycodone, pravastatin, pregabalin, primidone, propranolol, and sitagliptin.  Pharmacotherapy (Medications Ordered): No  orders of the defined types were placed in this encounter.  Orders:  No orders of the defined types were placed in this encounter.  Follow-up plan:   No follow-ups on file.     Interventional Therapies  Risk Factors  Considerations:   Allergy: LATEX NOTE: NO RFA until BMI is <30  Continue encouraging the patient to bring her BMI below 35.   Planned  Pending:   Therapeutic bilateral lumbar facet MBB #7 (#3 of 2023) (10/04/2022) NO-SHOW to pain management appointment for procedure: Bilateral lumbar facet block.  Did not call to cancel.   Under consideration:   Diagnostic bilateral T11-12 lumbar facet block   Diagnostic right T7-8 TESI   Diagnostic right T7 TFESI   Diagnostic ML CESI   Possible candidate for intrathecal pump trial and implant    Completed:   Palliative bilateral lumbar facet MBB x7 (10/25/2022) (100/95/95) Therapeutic left lumbar facet RFA x3 (05/24/2020)  Therapeutic right lumbar facet RFA x3 (04/07/2020)  (08/10/2020) referral to bariatric surgery and medical weight management    Completed by other providers:   Therapeutic/diagnostic bilateral SI Blk x1 (09/05/2021) by Sarajane Marek, MD (PMR)  Diagnostic/therapeutic bilateral L4/5 TFESI x1 (09/12/2017) by Karma Greaser, MD [facility])  Diagnostic/therapeutic bilateral SI Blk x1 (01/30/2017) by Karma Greaser, MD [facility])    Therapeutic  Palliative (PRN) options:   Palliative bilateral lumbar facet MBB   Palliative left lumbar facet RFA  Palliative right lumbar facet RFA     Pharmacotherapy  Nonopioids transfer 08/10/2020: Mobic, baclofen, and Lyrica.      Recent Visits Date Type Provider Dept  11/19/23 Office Visit Delano Metz, MD [facility]  Showing recent visits within past 90 days and meeting all other requirements Future Appointments Date Type Provider Dept  02/12/24 Appointment Delano Metz, MD [facility]  Showing future appointments within next 90 days  and meeting all other requirements  I discussed the assessment and treatment plan with the patient. The patient was provided an opportunity to ask questions and all were answered. The patient agreed with the plan and demonstrated an understanding of the instructions.  Patient advised to call back or seek an in-person evaluation if the symptoms or condition worsens.  Duration of encounter: *** minutes.  Total time on encounter, as per AMA guidelines included both the face-to-face and non-face-to-face time personally spent by the physician and/or other qualified health care professional(s) on the day of the encounter (includes time in activities that require the physician or other qualified health care professional and does not include time in activities normally  performed by clinical staff). Physician's time may include the following activities when performed: Preparing to see the patient (e.g., pre-charting review of records, searching for previously ordered imaging, lab work, and nerve conduction tests) Review of prior analgesic pharmacotherapies. Reviewing PMP Interpreting ordered tests (e.g., lab work, imaging, nerve conduction tests) Performing post-procedure evaluations, including interpretation of diagnostic procedures Obtaining and/or reviewing separately obtained history Performing a medically appropriate examination and/or evaluation Counseling and educating the patient/family/caregiver Ordering medications, tests, or procedures Referring and communicating with other health care professionals (when not separately reported) Documenting clinical information in the electronic or other health record Independently interpreting results (not separately reported) and communicating results to the patient/ family/caregiver Care coordination (not separately reported)  Note by: Oswaldo Done, MD (TTS and AI technology used. I apologize for any typographical errors that were not detected and  corrected.) Date: 02/12/2024; Time: 9:01 AM

## 2024-02-12 ENCOUNTER — Ambulatory Visit: Payer: MEDICAID | Attending: Pain Medicine | Admitting: Pain Medicine

## 2024-02-12 ENCOUNTER — Encounter: Payer: Self-pay | Admitting: Pain Medicine

## 2024-02-12 DIAGNOSIS — M47816 Spondylosis without myelopathy or radiculopathy, lumbar region: Secondary | ICD-10-CM

## 2024-02-12 DIAGNOSIS — M5442 Lumbago with sciatica, left side: Secondary | ICD-10-CM | POA: Diagnosis present

## 2024-02-12 DIAGNOSIS — G35 Multiple sclerosis: Secondary | ICD-10-CM

## 2024-02-12 DIAGNOSIS — Z79899 Other long term (current) drug therapy: Secondary | ICD-10-CM

## 2024-02-12 DIAGNOSIS — M79605 Pain in left leg: Secondary | ICD-10-CM | POA: Insufficient documentation

## 2024-02-12 DIAGNOSIS — Z79891 Long term (current) use of opiate analgesic: Secondary | ICD-10-CM | POA: Diagnosis present

## 2024-02-12 DIAGNOSIS — M25552 Pain in left hip: Secondary | ICD-10-CM

## 2024-02-12 DIAGNOSIS — M5459 Other low back pain: Secondary | ICD-10-CM | POA: Insufficient documentation

## 2024-02-12 DIAGNOSIS — M533 Sacrococcygeal disorders, not elsewhere classified: Secondary | ICD-10-CM | POA: Diagnosis present

## 2024-02-12 DIAGNOSIS — M79604 Pain in right leg: Secondary | ICD-10-CM | POA: Diagnosis present

## 2024-02-12 DIAGNOSIS — G8929 Other chronic pain: Secondary | ICD-10-CM | POA: Diagnosis present

## 2024-02-12 DIAGNOSIS — M25562 Pain in left knee: Secondary | ICD-10-CM

## 2024-02-12 DIAGNOSIS — M25551 Pain in right hip: Secondary | ICD-10-CM | POA: Diagnosis present

## 2024-02-12 DIAGNOSIS — M5441 Lumbago with sciatica, right side: Secondary | ICD-10-CM

## 2024-02-12 DIAGNOSIS — M25561 Pain in right knee: Secondary | ICD-10-CM

## 2024-02-12 DIAGNOSIS — G894 Chronic pain syndrome: Secondary | ICD-10-CM

## 2024-02-12 MED ORDER — OXYCODONE HCL 5 MG PO TABS: 5.0000 mg | ORAL_TABLET | Freq: Four times a day (QID) | ORAL | 0 refills | Status: DC | PRN

## 2024-02-12 MED ORDER — NALOXONE HCL 4 MG/0.1ML NA LIQD: 1.0000 | NASAL | 0 refills | Status: DC | PRN

## 2024-02-12 NOTE — Progress Notes (Unsigned)
 Nursing Pain Medication Assessment:  Safety precautions to be maintained throughout the outpatient stay will include: orient to surroundings, keep bed in low position, maintain call bell within reach at all times, provide assistance with transfer out of bed and ambulation.  Medication Inspection Compliance: Pill count conducted under aseptic conditions, in front of the patient. Neither the pills nor the bottle was removed from the patient's sight at any time. Once count was completed pills were immediately returned to the patient in their original bottle.  Medication: Oxycodone IR Pill/Patch Count:  23 of 120 pills remain Pill/Patch Appearance: Markings consistent with prescribed medication Bottle Appearance: Standard pharmacy container. Clearly labeled. Filled Date: 03 / 15 / 2025 Last Medication intake:  Today

## 2024-03-02 ENCOUNTER — Inpatient Hospital Stay: Payer: MEDICAID | Attending: Internal Medicine

## 2024-03-10 ENCOUNTER — Other Ambulatory Visit: Payer: Self-pay

## 2024-03-10 ENCOUNTER — Inpatient Hospital Stay (HOSPITAL_COMMUNITY): Payer: MEDICAID

## 2024-03-10 ENCOUNTER — Emergency Department: Payer: MEDICAID

## 2024-03-10 ENCOUNTER — Encounter (HOSPITAL_COMMUNITY): Payer: Self-pay

## 2024-03-10 ENCOUNTER — Inpatient Hospital Stay (HOSPITAL_COMMUNITY)
Admission: EM | Admit: 2024-03-10 | Discharge: 2024-03-14 | DRG: 092 | Disposition: A | Discharge status: Home / Self Care | Payer: MEDICAID | Source: Other Acute Inpatient Hospital | Attending: Internal Medicine | Admitting: Internal Medicine

## 2024-03-10 ENCOUNTER — Emergency Department
Admission: EM | Admit: 2024-03-10 | Discharge: 2024-03-10 | Disposition: A | Discharge status: Other Acute Inpatient Hospital | Payer: MEDICAID | Attending: Emergency Medicine | Admitting: Emergency Medicine

## 2024-03-10 DIAGNOSIS — I959 Hypotension, unspecified: Secondary | ICD-10-CM | POA: Diagnosis present

## 2024-03-10 DIAGNOSIS — T50915A Adverse effect of multiple unspecified drugs, medicaments and biological substances, initial encounter: Secondary | ICD-10-CM | POA: Diagnosis not present

## 2024-03-10 DIAGNOSIS — Z8669 Personal history of other diseases of the nervous system and sense organs: Secondary | ICD-10-CM

## 2024-03-10 DIAGNOSIS — Z7984 Long term (current) use of oral hypoglycemic drugs: Secondary | ICD-10-CM

## 2024-03-10 DIAGNOSIS — R296 Repeated falls: Secondary | ICD-10-CM | POA: Diagnosis present

## 2024-03-10 DIAGNOSIS — R55 Syncope and collapse: Secondary | ICD-10-CM | POA: Diagnosis not present

## 2024-03-10 DIAGNOSIS — R748 Abnormal levels of other serum enzymes: Secondary | ICD-10-CM | POA: Diagnosis not present

## 2024-03-10 DIAGNOSIS — W19XXXA Unspecified fall, initial encounter: Secondary | ICD-10-CM | POA: Diagnosis not present

## 2024-03-10 DIAGNOSIS — Z79891 Long term (current) use of opiate analgesic: Secondary | ICD-10-CM | POA: Diagnosis not present

## 2024-03-10 DIAGNOSIS — G894 Chronic pain syndrome: Secondary | ICD-10-CM | POA: Diagnosis present

## 2024-03-10 DIAGNOSIS — G40909 Epilepsy, unspecified, not intractable, without status epilepticus: Secondary | ICD-10-CM | POA: Diagnosis not present

## 2024-03-10 DIAGNOSIS — R001 Bradycardia, unspecified: Secondary | ICD-10-CM | POA: Diagnosis not present

## 2024-03-10 DIAGNOSIS — E785 Hyperlipidemia, unspecified: Secondary | ICD-10-CM | POA: Diagnosis present

## 2024-03-10 DIAGNOSIS — R197 Diarrhea, unspecified: Secondary | ICD-10-CM | POA: Diagnosis not present

## 2024-03-10 DIAGNOSIS — G928 Other toxic encephalopathy: Secondary | ICD-10-CM | POA: Diagnosis not present

## 2024-03-10 DIAGNOSIS — F32A Depression, unspecified: Secondary | ICD-10-CM | POA: Diagnosis present

## 2024-03-10 DIAGNOSIS — Z79899 Other long term (current) drug therapy: Secondary | ICD-10-CM

## 2024-03-10 DIAGNOSIS — G35 Multiple sclerosis: Secondary | ICD-10-CM | POA: Diagnosis not present

## 2024-03-10 DIAGNOSIS — Z6836 Body mass index (BMI) 36.0-36.9, adult: Secondary | ICD-10-CM | POA: Diagnosis not present

## 2024-03-10 DIAGNOSIS — N3281 Overactive bladder: Secondary | ICD-10-CM | POA: Diagnosis present

## 2024-03-10 DIAGNOSIS — R4189 Other symptoms and signs involving cognitive functions and awareness: Secondary | ICD-10-CM

## 2024-03-10 DIAGNOSIS — E119 Type 2 diabetes mellitus without complications: Secondary | ICD-10-CM | POA: Diagnosis present

## 2024-03-10 DIAGNOSIS — I9589 Other hypotension: Secondary | ICD-10-CM | POA: Diagnosis not present

## 2024-03-10 DIAGNOSIS — K219 Gastro-esophageal reflux disease without esophagitis: Secondary | ICD-10-CM | POA: Diagnosis present

## 2024-03-10 DIAGNOSIS — R569 Unspecified convulsions: Secondary | ICD-10-CM | POA: Diagnosis not present

## 2024-03-10 DIAGNOSIS — R4701 Aphasia: Secondary | ICD-10-CM | POA: Diagnosis not present

## 2024-03-10 DIAGNOSIS — G9340 Encephalopathy, unspecified: Secondary | ICD-10-CM | POA: Diagnosis not present

## 2024-03-10 DIAGNOSIS — E66812 Obesity, class 2: Secondary | ICD-10-CM | POA: Diagnosis not present

## 2024-03-10 DIAGNOSIS — Z87891 Personal history of nicotine dependence: Secondary | ICD-10-CM

## 2024-03-10 DIAGNOSIS — R4182 Altered mental status, unspecified: Secondary | ICD-10-CM

## 2024-03-10 DIAGNOSIS — E872 Acidosis, unspecified: Secondary | ICD-10-CM | POA: Diagnosis present

## 2024-03-10 DIAGNOSIS — Z91040 Latex allergy status: Secondary | ICD-10-CM

## 2024-03-10 DIAGNOSIS — E876 Hypokalemia: Secondary | ICD-10-CM | POA: Diagnosis present

## 2024-03-10 DIAGNOSIS — G9341 Metabolic encephalopathy: Secondary | ICD-10-CM | POA: Diagnosis not present

## 2024-03-10 DIAGNOSIS — T796XXA Traumatic ischemia of muscle, initial encounter: Secondary | ICD-10-CM | POA: Diagnosis present

## 2024-03-10 DIAGNOSIS — R404 Transient alteration of awareness: Secondary | ICD-10-CM | POA: Diagnosis present

## 2024-03-10 DIAGNOSIS — R0689 Other abnormalities of breathing: Secondary | ICD-10-CM | POA: Diagnosis present

## 2024-03-10 DIAGNOSIS — G379 Demyelinating disease of central nervous system, unspecified: Principal | ICD-10-CM

## 2024-03-10 LAB — CBC WITH DIFFERENTIAL/PLATELET
Abs Immature Granulocytes: 0.03 10*3/uL (ref 0.00–0.07)
Abs Immature Granulocytes: 0.03 10*3/uL (ref 0.00–0.07)
Basophils Absolute: 0 10*3/uL (ref 0.0–0.1)
Basophils Absolute: 0 10*3/uL (ref 0.0–0.1)
Basophils Relative: 1 %
Basophils Relative: 0 %
Eosinophils Absolute: 0.1 10*3/uL (ref 0.0–0.5)
Eosinophils Absolute: 0.1 10*3/uL (ref 0.0–0.5)
Eosinophils Relative: 3 %
Eosinophils Relative: 2 %
HCT: 42.2 % (ref 36.0–46.0)
HCT: 38.9 % (ref 36.0–46.0)
Hemoglobin: 13.5 g/dL (ref 12.0–15.0)
Hemoglobin: 13.1 g/dL (ref 12.0–15.0)
Immature Granulocytes: 1 %
Immature Granulocytes: 0 %
Lymphocytes Relative: 8 %
Lymphocytes Relative: 4 %
Lymphs Abs: 0.5 10*3/uL — ABNORMAL LOW (ref 0.7–4.0)
Lymphs Abs: 0.3 10*3/uL — ABNORMAL LOW (ref 0.7–4.0)
MCH: 28.7 pg (ref 26.0–34.0)
MCH: 29.6 pg (ref 26.0–34.0)
MCHC: 32 g/dL (ref 30.0–36.0)
MCHC: 33.7 g/dL (ref 30.0–36.0)
MCV: 89.8 fL (ref 80.0–100.0)
MCV: 88 fL (ref 80.0–100.0)
Monocytes Absolute: 0.6 10*3/uL (ref 0.1–1.0)
Monocytes Absolute: 0.7 10*3/uL (ref 0.1–1.0)
Monocytes Relative: 11 %
Monocytes Relative: 9 %
Neutro Abs: 4.2 10*3/uL (ref 1.7–7.7)
Neutro Abs: 6.3 10*3/uL (ref 1.7–7.7)
Neutrophils Relative %: 76 %
Neutrophils Relative %: 85 %
Platelets: 178 10*3/uL (ref 150–400)
Platelets: 171 10*3/uL (ref 150–400)
RBC: 4.7 MIL/uL (ref 3.87–5.11)
RBC: 4.42 MIL/uL (ref 3.87–5.11)
RDW: 15.3 % (ref 11.5–15.5)
RDW: 15.6 % — ABNORMAL HIGH (ref 11.5–15.5)
WBC: 5.5 10*3/uL (ref 4.0–10.5)
WBC: 7.5 10*3/uL (ref 4.0–10.5)
nRBC: 0 % (ref 0.0–0.2)
nRBC: 0 % (ref 0.0–0.2)

## 2024-03-10 LAB — URINALYSIS, W/ REFLEX TO CULTURE (INFECTION SUSPECTED)
Bilirubin Urine: NEGATIVE
Glucose, UA: NEGATIVE mg/dL
Hgb urine dipstick: NEGATIVE
Ketones, ur: NEGATIVE mg/dL
Leukocytes,Ua: NEGATIVE
Nitrite: NEGATIVE
Protein, ur: NEGATIVE mg/dL
Specific Gravity, Urine: 1.012 (ref 1.005–1.030)
pH: 5 (ref 5.0–8.0)

## 2024-03-10 LAB — BLOOD GAS, ARTERIAL
Acid-base deficit: 2.4 mmol/L — ABNORMAL HIGH (ref 0.0–2.0)
Bicarbonate: 21.6 mmol/L (ref 20.0–28.0)
FIO2: 40 %
MECHVT: 450 mL
Mechanical Rate: 22
O2 Saturation: 100 %
PEEP: 8 cmH2O
Patient temperature: 37
pCO2 arterial: 34 mmHg (ref 32–48)
pH, Arterial: 7.41 (ref 7.35–7.45)
pO2, Arterial: 96 mmHg (ref 83–108)

## 2024-03-10 LAB — COMPREHENSIVE METABOLIC PANEL WITH GFR
ALT: 14 U/L (ref 0–44)
ALT: 14 U/L (ref 0–44)
AST: 31 U/L (ref 15–41)
AST: 32 U/L (ref 15–41)
Albumin: 3.5 g/dL (ref 3.5–5.0)
Albumin: 3 g/dL — ABNORMAL LOW (ref 3.5–5.0)
Alkaline Phosphatase: 65 U/L (ref 38–126)
Alkaline Phosphatase: 61 U/L (ref 38–126)
Anion gap: 10 (ref 5–15)
Anion gap: 11 (ref 5–15)
BUN: 13 mg/dL (ref 6–20)
BUN: 10 mg/dL (ref 6–20)
CO2: 25 mmol/L (ref 22–32)
CO2: 21 mmol/L — ABNORMAL LOW (ref 22–32)
Calcium: 9.2 mg/dL (ref 8.9–10.3)
Calcium: 8.5 mg/dL — ABNORMAL LOW (ref 8.9–10.3)
Chloride: 102 mmol/L (ref 98–111)
Chloride: 106 mmol/L (ref 98–111)
Creatinine, Ser: 1.32 mg/dL — ABNORMAL HIGH (ref 0.44–1.00)
Creatinine, Ser: 1.08 mg/dL — ABNORMAL HIGH (ref 0.44–1.00)
GFR, Estimated: 48 mL/min — ABNORMAL LOW (ref >60)
GFR, Estimated: >60 mL/min (ref >60)
Glucose, Bld: 101 mg/dL — ABNORMAL HIGH (ref 70–99)
Glucose, Bld: 115 mg/dL — ABNORMAL HIGH (ref 70–99)
Potassium: 2.9 mmol/L — ABNORMAL LOW (ref 3.5–5.1)
Potassium: 2.5 mmol/L — CL (ref 3.5–5.1)
Sodium: 137 mmol/L (ref 135–145)
Sodium: 138 mmol/L (ref 135–145)
Total Bilirubin: 0.7 mg/dL (ref 0.0–1.2)
Total Bilirubin: 0.8 mg/dL (ref 0.0–1.2)
Total Protein: 7.1 g/dL (ref 6.5–8.1)
Total Protein: 6.3 g/dL — ABNORMAL LOW (ref 6.5–8.1)

## 2024-03-10 LAB — GLUCOSE, CAPILLARY
Glucose-Capillary: 119 mg/dL — ABNORMAL HIGH (ref 70–99)
Glucose-Capillary: 140 mg/dL — ABNORMAL HIGH (ref 70–99)
Glucose-Capillary: 144 mg/dL — ABNORMAL HIGH (ref 70–99)

## 2024-03-10 LAB — PROTIME-INR
INR: 1 (ref 0.8–1.2)
INR: 1.1 (ref 0.8–1.2)
Prothrombin Time: 13.7 s (ref 11.4–15.2)
Prothrombin Time: 14.1 s (ref 11.4–15.2)

## 2024-03-10 LAB — URINE DRUG SCREEN, QUALITATIVE (ARMC ONLY)
Amphetamines, Ur Screen: NOT DETECTED
Barbiturates, Ur Screen: POSITIVE — AB
Benzodiazepine, Ur Scrn: NOT DETECTED
Cannabinoid 50 Ng, Ur ~~LOC~~: NOT DETECTED
Cocaine Metabolite,Ur ~~LOC~~: NOT DETECTED
MDMA (Ecstasy)Ur Screen: NOT DETECTED
Methadone Scn, Ur: NOT DETECTED
Opiate, Ur Screen: NOT DETECTED
Phencyclidine (PCP) Ur S: NOT DETECTED
Tricyclic, Ur Screen: POSITIVE — AB

## 2024-03-10 LAB — ETHANOL: Alcohol, Ethyl (B): <15 mg/dL (ref <15)

## 2024-03-10 LAB — CK: Total CK: 1000 U/L — ABNORMAL HIGH (ref 38–234)

## 2024-03-10 LAB — LACTIC ACID, PLASMA
Lactic Acid, Venous: 2.1 mmol/L (ref 0.5–1.9)
Lactic Acid, Venous: 1.7 mmol/L (ref 0.5–1.9)
Lactic Acid, Venous: 1 mmol/L (ref 0.5–1.9)
Lactic Acid, Venous: 0.7 mmol/L (ref 0.5–1.9)

## 2024-03-10 LAB — PHOSPHORUS
Phosphorus: 4.3 mg/dL (ref 2.5–4.6)
Phosphorus: 3.7 mg/dL (ref 2.5–4.6)

## 2024-03-10 LAB — RESP PANEL BY RT-PCR (RSV, FLU A&B, COVID)  RVPGX2
Influenza A by PCR: NEGATIVE
Influenza B by PCR: NEGATIVE
Resp Syncytial Virus by PCR: NEGATIVE
SARS Coronavirus 2 by RT PCR: NEGATIVE

## 2024-03-10 LAB — HIV ANTIBODY (ROUTINE TESTING W REFLEX): HIV Screen 4th Generation wRfx: NONREACTIVE

## 2024-03-10 LAB — MAGNESIUM
Magnesium: 1.7 mg/dL (ref 1.7–2.4)
Magnesium: 1.3 mg/dL — ABNORMAL LOW (ref 1.7–2.4)

## 2024-03-10 LAB — CBG MONITORING, ED: Glucose-Capillary: 96 mg/dL (ref 70–99)

## 2024-03-10 LAB — MRSA NEXT GEN BY PCR, NASAL: MRSA by PCR Next Gen: NOT DETECTED

## 2024-03-10 MED ORDER — SODIUM CHLORIDE 0.9 % IV SOLN
2.0000 g | Freq: Once | INTRAVENOUS | Status: AC
  Administered 2024-03-10: 2 g via INTRAVENOUS
  Filled 2024-03-10: qty 12.5

## 2024-03-10 MED ORDER — LACTATED RINGERS IV SOLN: INTRAVENOUS | Status: DC

## 2024-03-10 MED ORDER — CHLORHEXIDINE GLUCONATE CLOTH 2 % EX PADS
6.0000 | MEDICATED_PAD | Freq: Every day | CUTANEOUS | Status: DC
  Administered 2024-03-10 – 2024-03-11 (×2): 6 via TOPICAL

## 2024-03-10 MED ORDER — FENTANYL BOLUS VIA INFUSION
50.0000 ug | INTRAVENOUS | Status: DC | PRN
Start: 2024-03-10 — End: 2024-03-11

## 2024-03-10 MED ORDER — ORAL CARE MOUTH RINSE: 15.0000 mL | OROMUCOSAL | Status: DC | PRN

## 2024-03-10 MED ORDER — POLYETHYLENE GLYCOL 3350 17 G PO PACK: 17.0000 g | PACK | Freq: Every day | ORAL | Status: DC

## 2024-03-10 MED ORDER — ETOMIDATE 2 MG/ML IV SOLN
INTRAVENOUS | Status: AC
  Filled 2024-03-10: qty 10

## 2024-03-10 MED ORDER — HEPARIN SODIUM (PORCINE) 5000 UNIT/ML IJ SOLN
5000.0000 [IU] | Freq: Three times a day (TID) | INTRAMUSCULAR | Status: DC
Start: 2024-03-11 — End: 2024-03-14
  Administered 2024-03-11 – 2024-03-14 (×10): 5000 [IU] via SUBCUTANEOUS
  Filled 2024-03-10 (×10): qty 1

## 2024-03-10 MED ORDER — MAGNESIUM SULFATE 4 GM/100ML IV SOLN
4.0000 g | Freq: Once | INTRAVENOUS | Status: AC
  Administered 2024-03-10: 4 g via INTRAVENOUS
  Filled 2024-03-10: qty 100

## 2024-03-10 MED ORDER — GADOBUTROL 1 MMOL/ML IV SOLN
10.0000 mL | Freq: Once | INTRAVENOUS | Status: AC | PRN
  Administered 2024-03-10: 10 mL via INTRAVENOUS

## 2024-03-10 MED ORDER — FENTANYL CITRATE PF 50 MCG/ML IJ SOSY: 50.0000 ug | PREFILLED_SYRINGE | Freq: Once | INTRAMUSCULAR | Status: DC

## 2024-03-10 MED ORDER — LEVETIRACETAM (KEPPRA) 500 MG/5 ML ADULT IV PUSH
2000.0000 mg | Freq: Once | INTRAVENOUS | Status: AC
  Administered 2024-03-10: 2000 mg via INTRAVENOUS
  Filled 2024-03-10: qty 20

## 2024-03-10 MED ORDER — MIDAZOLAM HCL 2 MG/2ML IJ SOLN
INTRAMUSCULAR | Status: AC
  Administered 2024-03-10: 1 mg
  Filled 2024-03-10: qty 2

## 2024-03-10 MED ORDER — SODIUM CHLORIDE 0.9 % IV BOLUS
2721.0000 mL | Freq: Once | INTRAVENOUS | Status: AC
  Administered 2024-03-10: 2000 mL via INTRAVENOUS
  Administered 2024-03-10: 721 mL via INTRAVENOUS

## 2024-03-10 MED ORDER — ORAL CARE MOUTH RINSE: 15.0000 mL | OROMUCOSAL | Status: DC

## 2024-03-10 MED ORDER — METRONIDAZOLE 500 MG/100ML IV SOLN
500.0000 mg | Freq: Once | INTRAVENOUS | Status: AC
  Administered 2024-03-10: 500 mg via INTRAVENOUS
  Filled 2024-03-10: qty 100

## 2024-03-10 MED ORDER — FENTANYL 2500MCG IN NS 250ML (10MCG/ML) PREMIX INFUSION
0.0000 ug/h | INTRAVENOUS | Status: DC
  Administered 2024-03-10 (×2): 25 ug/h via INTRAVENOUS
  Filled 2024-03-10: qty 250

## 2024-03-10 MED ORDER — VANCOMYCIN HCL IN DEXTROSE 1-5 GM/200ML-% IV SOLN
1000.0000 mg | Freq: Once | INTRAVENOUS | Status: AC
  Administered 2024-03-10: 1000 mg via INTRAVENOUS
  Filled 2024-03-10: qty 200

## 2024-03-10 MED ORDER — DOCUSATE SODIUM 100 MG PO CAPS: 100.0000 mg | ORAL_CAPSULE | Freq: Two times a day (BID) | ORAL | Status: DC | PRN

## 2024-03-10 MED ORDER — PROPOFOL 1000 MG/100ML IV EMUL
5.0000 ug/kg/min | INTRAVENOUS | Status: DC
  Administered 2024-03-10: 5 ug/kg/min via INTRAVENOUS
  Administered 2024-03-10: 70 ug/kg/min via INTRAVENOUS
  Filled 2024-03-10: qty 100

## 2024-03-10 MED ORDER — NOREPINEPHRINE 4 MG/250ML-% IV SOLN
INTRAVENOUS | Status: AC
  Filled 2024-03-10: qty 250

## 2024-03-10 MED ORDER — FENTANYL 2500MCG IN NS 250ML (10MCG/ML) PREMIX INFUSION: 50.0000 ug/h | INTRAVENOUS | Status: DC

## 2024-03-10 MED ORDER — ROCURONIUM BROMIDE 10 MG/ML (PF) SYRINGE
PREFILLED_SYRINGE | INTRAVENOUS | Status: AC
Start: 2024-03-10 — End: 2024-03-10
  Administered 2024-03-10: 100 mg
  Filled 2024-03-10: qty 10

## 2024-03-10 MED ORDER — LACTATED RINGERS IV SOLN: INTRAVENOUS | Status: AC

## 2024-03-10 MED ORDER — INSULIN ASPART 100 UNIT/ML IJ SOLN
0.0000 [IU] | INTRAMUSCULAR | Status: DC
  Administered 2024-03-10 – 2024-03-11 (×3): 1 [IU] via SUBCUTANEOUS

## 2024-03-10 MED ORDER — POLYETHYLENE GLYCOL 3350 17 G PO PACK: 17.0000 g | PACK | Freq: Every day | ORAL | Status: DC | PRN

## 2024-03-10 MED ORDER — DOCUSATE SODIUM 50 MG/5ML PO LIQD: 100.0000 mg | Freq: Two times a day (BID) | ORAL | Status: DC

## 2024-03-10 MED ORDER — NALOXONE HCL 4 MG/0.1ML NA LIQD
NASAL | Status: AC
  Administered 2024-03-10: 4 mg
  Filled 2024-03-10: qty 4

## 2024-03-10 MED ORDER — MAGNESIUM SULFATE 2 GM/50ML IV SOLN
2.0000 g | Freq: Once | INTRAVENOUS | Status: AC
  Administered 2024-03-10: 2 g via INTRAVENOUS
  Filled 2024-03-10: qty 50

## 2024-03-10 MED ORDER — POTASSIUM CHLORIDE 10 MEQ/100ML IV SOLN
10.0000 meq | INTRAVENOUS | Status: AC
  Administered 2024-03-10 – 2024-03-11 (×8): 10 meq via INTRAVENOUS
  Filled 2024-03-10 (×8): qty 100

## 2024-03-10 MED ORDER — PANTOPRAZOLE SODIUM 40 MG IV SOLR
40.0000 mg | Freq: Every day | INTRAVENOUS | Status: DC
  Administered 2024-03-10: 40 mg via INTRAVENOUS
  Filled 2024-03-10: qty 10

## 2024-03-10 NOTE — Sepsis Progress Note (Signed)
 Per MD pt has received 2 liters of fluid

## 2024-03-10 NOTE — ED Notes (Signed)
 Pt awake with [facility]

## 2024-03-10 NOTE — ED Notes (Signed)
 EMTALA reviewed by this RN.

## 2024-03-10 NOTE — Consult Note (Incomplete)
 NEUROLOGY CONSULT NOTE   Date of service: Mar 10, 2024 Patient Name: Shannon Benitez MRN:  [id number] DOB:  03/19/1971 Chief Complaint: "Possible seizure" Requesting Provider: Trevor Fudge, MD  History of Present Illness  Shannon Benitez is a 53 y.o. female with hx of MS and prior seizure who was   LKW: *** Modified rankin score: {Modified Rankin Scale:21264} IV Thrombolysis: ***Yes, *** No (reason) EVT: ***Yes, *** No (reason) ICH Score:***  NIHSS components Score: Comment  1a Level of Conscious 0[]  1[]  2[]  3[]      1b LOC Questions 0[]  1[]  2[]       1c LOC Commands 0[]  1[]  2[]       2 Best Gaze 0[]  1[]  2[]       3 Visual 0[]  1[]  2[]  3[]      4 Facial Palsy 0[]  1[]  2[]  3[]      5a Motor Arm - left 0[]  1[]  2[]  3[]  4[]  UN[]    5b Motor Arm - Right 0[]  1[]  2[]  3[]  4[]  UN[]    6a Motor Leg - Left 0[]  1[]  2[]  3[]  4[]  UN[]    6b Motor Leg - Right 0[]  1[]  2[]  3[]  4[]  UN[]    7 Limb Ataxia 0[]  1[]  2[]  UN[]      8 Sensory 0[]  1[]  2[]  UN[]      9 Best Language 0[]  1[]  2[]  3[]      10 Dysarthria 0[]  1[]  2[]  UN[]      11 Extinct. and Inattention 0[]  1[]  2[]       TOTAL:       ROS  ***Comprehensive ROS performed and pertinent positives documented in HPI  ***Unable to ascertain due to ***  Past History   Past Medical History:  Diagnosis Date  . Acute postoperative pain 04/30/2019  . Diabetes mellitus without complication (HCC)   . GERD (gastroesophageal reflux disease)   . Headache    daily  . MS (multiple sclerosis) (HCC)   . Numbness and tingling of both legs below knees    pt reports secondary to MS  . Osteoarthritis    lumbar  . Vertigo    last episode opver 18 yrs ago  . Wears dentures    full upper and lower    Past Surgical History:  Procedure Laterality Date  . ABDOMINAL HYSTERECTOMY    . CATARACT EXTRACTION W/PHACO Right 03/18/2019   Procedure: CATARACT EXTRACTION PHACO AND INTRAOCULAR LENS PLACEMENT (IOC) RIGHT;  Surgeon: Rosa College, MD;  Location: [facility];  Service: Ophthalmology;  Laterality: Right;  Diabetic - oral meds Latex sensitivity  . CATARACT EXTRACTION W/PHACO Left 04/13/2019   Procedure: CATARACT EXTRACTION PHACO AND INTRAOCULAR LENS PLACEMENT (IOC)  LEFT;  Surgeon: Rosa College, MD;  Location: [facility];  Service: Ophthalmology;  Laterality: Left;  Diabetic - oral meds  . CESAREAN SECTION    . TUMOR REMOVAL Left    pt was 53 years old    Family History: Family History  Adopted: Yes    Social History  reports that she quit smoking about 7 years ago. Her smoking use included cigarettes. She started smoking about 25 years ago. She has a 9 pack-year smoking history. She has quit using smokeless tobacco. She reports that she does not drink alcohol. No history on file for drug use.  Allergies  Allergen Reactions  . 5-Alpha Reductase Inhibitors   . Desvenlafaxine     Allergic reaction  . Glatiramer Other (See Comments)    Pt states me gave her cold spells.   . Rebif [Interferon Beta-1a]  Other (See Comments)    The skin around the injection site turned black  . Gramineae Pollens Itching  . Latex Itching    Only when gloves are worn by pt  . Tramadol  Diarrhea, Nausea And Vomiting and Anxiety    Medications   Current Facility-Administered Medications:  .  Chlorhexidine Gluconate Cloth 2 % PADS 6 each, 6 each, Topical, Daily, Chand, Sudham, MD, 6 each at Apr 02, 2024 1716 .  docusate (COLACE) 50 MG/5ML liquid 100 mg, 100 mg, Per Tube, BID, Alayne Hubert, Stephanie M, PA-C .  docusate sodium (COLACE) capsule 100 mg, 100 mg, Oral, BID PRN, Shelli Dexter M, PA-C .  fentaNYL  (SUBLIMAZE ) bolus via infusion 50-100 mcg, 50-100 mcg, Intravenous, Q15 min PRN, Shelli Dexter M, PA-C .  fentaNYL  (SUBLIMAZE ) injection 50 mcg, 50 mcg, Intravenous, Once, Reese, Stephanie M, PA-C .  fentaNYL  in NS (46mcg/ml) infusion-PREMIX, 50-200 mcg/hr, Intravenous, Continuous, Shelli Dexter M, PA-C .  [START ON  03/11/2024] heparin injection 5,000 Units, 5,000 Units, Subcutaneous, Q8H, Reese, Stephanie M, PA-C .  insulin aspart (novoLOG) injection 0-9 Units, 0-9 Units, Subcutaneous, Q4H, Shelli Dexter M, PA-C, 1 Units at Apr 02, 2024 2130 .  lactated ringers  infusion, , Intravenous, Continuous, Gleason, Patt Boozer, PA-C, Last Rate: 75 mL/hr at Apr 02, 2024 1823, Infusion Verify at Apr 02, 2024 1823 .  Oral care mouth rinse, 15 mL, Mouth Rinse, PRN, Chand, Sudham, MD .  pantoprazole (PROTONIX) injection 40 mg, 40 mg, Intravenous, QHS, Shelli Dexter M, PA-C, 40 mg at 04-02-24 2139 .  polyethylene glycol (MIRALAX / GLYCOLAX) packet 17 g, 17 g, Oral, Daily PRN, Shelli Dexter M, PA-C .  polyethylene glycol (MIRALAX / GLYCOLAX) packet 17 g, 17 g, Per Tube, Daily, Alayne Hubert, Stephanie M, PA-C .  potassium chloride 10 mEq in 100 mL IVPB, 10 mEq, Intravenous, Q1H, Ogan, Okoronkwo U, MD, Last Rate: 100 mL/hr at 04/02/2024 2333, 10 mEq at 02-Apr-2024 2333  Vitals   Vitals:   04/02/2024 2000 04/02/24 2031 04/02/2024 2100 02-Apr-2024 2200  BP: 121/70 118/67 113/66 (!) 91/50  Pulse: 79 76 76 70  Resp: 17 14 15 15   Temp: 99 F (37.2 C) 99.5 F (37.5 C) 99.5 F (37.5 C) 99.7 F (37.6 C)  TempSrc:      SpO2: 98% 98% 97% 97%  Weight:      Height:        Body mass index is 37.72 kg/m.  Physical Exam   Constitutional: Appears well-developed and well-nourished. *** Psych: Affect appropriate to situation. *** Eyes: No scleral injection. *** HENT: No OP obstruction. *** Head: Normocephalic. *** Cardiovascular: Normal rate and regular rhythm. *** Respiratory: Effort normal, non-labored breathing. *** GI: Soft.  No distension. There is no tenderness. *** Skin: WDI. ***  Neurologic Examination   ***  Labs/Imaging/Neurodiagnostic studies   CBC:  Recent Labs  Lab 04-02-2024 1044 Apr 02, 2024 1904  WBC 5.5 7.5  NEUTROABS 4.2 6.3  HGB 13.5 13.1  HCT 42.2 38.9  MCV 89.8 88.0  PLT 178 171   Basic Metabolic Panel:  Lab Results   Component Value Date   NA 138 04/02/2024   K 2.5 (LL) 2024-04-02   CO2 21 (L) 04-02-2024   GLUCOSE 115 (H) 04-02-2024   BUN 10 Apr 02, 2024   CREATININE 1.08 (H) 04-02-24   CALCIUM 8.5 (L) Apr 02, 2024   GFRNONAA >60 Apr 02, 2024   GFRAA >60 09/25/2019   Lipid Panel: No results found for: "LDLCALC" HgbA1c: No results found for: "HGBA1C" Urine Drug Screen:     Component Value Date/Time  LABOPIA NONE DETECTED 03/10/2024 1126   COCAINSCRNUR NONE DETECTED 03/10/2024 1126   LABBENZ NONE DETECTED 03/10/2024 1126   AMPHETMU NONE DETECTED 03/10/2024 1126   THCU NONE DETECTED 03/10/2024 1126   LABBARB POSITIVE (A) 03/10/2024 1126    Alcohol Level     Component Value Date/Time   ETH <15 03/10/2024 1308   INR  Lab Results  Component Value Date   INR 1.1 03/10/2024   APTT No results found for: "APTT" AED levels: No results found for: "PHENYTOIN", "ZONISAMIDE", "LAMOTRIGINE", "LEVETIRACETA"  CT Head without contrast(Personally reviewed): ***  CT angio Head and Neck with contrast(Personally reviewed): ***  MR Angio head without contrast and Carotid Duplex BL(Personally reviewed): ***  MRI Brain(Personally reviewed): ***  Neurodiagnostics rEEG:  ***  ASSESSMENT   Sibbie Sensing is a 53 y.o. female ***  RECOMMENDATIONS  *** ______________________________________________________________________    Flint Hummer, MD [facility]

## 2024-03-10 NOTE — ED Notes (Signed)
 Pt covered in feces. Pt getting cleaned at this time.

## 2024-03-10 NOTE — Sepsis Progress Note (Signed)
 Notified bedside nurse of need to administer fluid bolus, pt needs 2721 cc fluid.

## 2024-03-10 NOTE — ED Notes (Signed)
 Pt given Nasal Narcan .

## 2024-03-10 NOTE — ED Triage Notes (Signed)
 Pt BIBACEMS from Home for unresponsive. Pt found by son after returning home from work. Pt unresonsive, urine and feces found on pt, unsure how long pt laying in floor. EMS gives GCS of 3, pt does have hx of seizure been off one of her meds for a week. Pt also has hx of MS and has had frequent falls last week per EMS no LOC/hitting head recently. /CBG 91, HR 40's, 70/35 Pt currently unresponsive to painful stimuli

## 2024-03-10 NOTE — ED Notes (Signed)
Rocuronium given

## 2024-03-10 NOTE — Progress Notes (Signed)
 eLink Physician-Brief Progress Note Patient Name: Shannon Benitez DOB: Apr 11, 1971 MRN: [id number]   Date of Service  03/10/2024  HPI/Events of Note  K+ 2.5, Mg++ 1.3, Creat 1.08  eICU Interventions  Electrolytes replaced per protocol.        [name] [name] 03/10/2024, 8:39 PM

## 2024-03-10 NOTE — ED Provider Notes (Signed)
 [facility] Provider Note    Event Date/Time   First MD Initiated Contact with Patient 03/10/24 1037     (approximate)   History   unresponsive   HPI Shannon Benitez is a 53 y.o. female with history of multiple sclerosis, chronic pain syndrome presenting today for unresponsiveness.  Son came home from his overnight shift and found her unresponsive in her chair.  She was covered in feces and urine.  He tried to shake her but she did not respond at all and EMS was called.  Did have a pulse and was breathing spontaneously but low blood pressures in the 70s systolic.  Blood glucose 91.  Son reports he has had multiple falls in the past several weeks from her multiple sclerosis including head and neck injury.  Was reportedly fine last night before he went to work but did have a fall yesterday evening around 7 PM.  He states she has not been eating and drinking a lot over the past 2 weeks.     Physical Exam   Triage Vital Signs: ED Triage Vitals [03/10/24 1033]  Encounter Vitals Group     BP      Systolic BP Percentile      Diastolic BP Percentile      Pulse      Resp      Temp      Temp src      SpO2      Weight 200 lb (90.7 kg)     Height 5\' 3"  (1.6 m)     Head Circumference      Peak Flow      Pain Score      Pain Loc      Pain Education      Exclude from Growth Chart     Most recent vital signs: Vitals:   03/10/24 1405 03/10/24 1409  BP: 111/65   Pulse: (!) 119   Resp: 18   Temp:  (!) 94 F (34.4 C)  SpO2: 100%     I have reviewed the vital signs. General: Unresponsive, GCS 3 Head:  Normocephalic, Atraumatic. EENT:  PERRL, Oral mucosa pink and moist, Neck is supple. Cardiovascular: Bradycardic rate, 2+ distal pulses. Respiratory:  Normal respiratory effort, symmetrical expansion, no distress.   Extremities:  Moving all four extremities through full ROM without pain.   Neuro: Unresponsive, GCS 3, normal pupillary size with  reaction to light Skin:  Warm, dry, no rash.     ED Results / Procedures / Treatments   Labs (all labs ordered are listed, but only abnormal results are displayed) Labs Reviewed  LACTIC ACID, PLASMA - Abnormal; Notable for the following components:      Result Value   Lactic Acid, Venous 2.1 (*)    All other components within normal limits  COMPREHENSIVE METABOLIC PANEL WITH GFR - Abnormal; Notable for the following components:   Potassium 2.9 (*)    Glucose, Bld 101 (*)    Creatinine, Ser 1.32 (*)    GFR, Estimated 48 (*)    All other components within normal limits  CBC WITH DIFFERENTIAL/PLATELET - Abnormal; Notable for the following components:   Lymphs Abs 0.5 (*)    All other components within normal limits  URINALYSIS, W/ REFLEX TO CULTURE (INFECTION SUSPECTED) - Abnormal; Notable for the following components:   Color, Urine YELLOW (*)    APPearance HAZY (*)    Bacteria, UA FEW (*)    All other components  within normal limits  URINE DRUG SCREEN, QUALITATIVE ([facility] ONLY) - Abnormal; Notable for the following components:   Tricyclic, Ur Screen POSITIVE (*)    Barbiturates, Ur Screen POSITIVE (*)    All other components within normal limits  BLOOD GAS, ARTERIAL - Abnormal; Notable for the following components:   Acid-base deficit 2.4 (*)    All other components within normal limits  CK - Abnormal; Notable for the following components:   Total CK 1,000 (*)    All other components within normal limits  RESP PANEL BY RT-PCR (RSV, FLU A&B, COVID)  [id number]  CULTURE, BLOOD (ROUTINE X 2)  CULTURE, BLOOD (ROUTINE X 2)  LACTIC ACID, PLASMA  PROTIME-INR  MAGNESIUM  PHOSPHORUS  ETHANOL  CBG MONITORING, ED     EKG My EKG interpretation: Rate of 34, sinus bradycardia, normal axis, normal intervals.  No acute ST elevations or depressions.   RADIOLOGY Independently interpreted CT head and C-spine with no acute pathology.  Independently interpreted chest x-ray with proper tube  placement.   PROCEDURES:  Critical Care performed: Yes, see critical care procedure note(s)  .Critical Care  Performed by: Kandee Orion, MD Authorized by: Kandee Orion, MD   Critical care provider statement:    Critical care time (minutes):  40   Critical care was necessary to treat or prevent imminent or life-threatening deterioration of the following conditions:  Shock, CNS failure or compromise and sepsis   Critical care was time spent personally by me on the following activities:  Development of treatment plan with patient or surrogate, discussions with consultants, evaluation of patient's response to treatment, examination of patient, ordering and review of laboratory studies, ordering and review of radiographic studies, ordering and performing treatments and interventions, pulse oximetry, re-evaluation of patient's condition and review of old charts   I assumed direction of critical care for this patient from another provider in my specialty: no     Care discussed with: admitting provider   Procedure Name: Intubation Date/Time: 03/10/2024 2:18 PM  Performed by: Kandee Orion, MDPre-anesthesia Checklist: Patient identified, Patient being monitored, Emergency Drugs available, Timeout performed and Suction available Oxygen Delivery Method: Non-rebreather mask Preoxygenation: Pre-oxygenation with 100% oxygen Induction Type: Rapid sequence Ventilation: Mask ventilation without difficulty Laryngoscope Size: Glidescope and 4 Grade View: Grade I Tube size: 7.5 mm Number of attempts: 1 Airway Equipment and Method: Video-laryngoscopy and Rigid stylet Placement Confirmation: ETT inserted through vocal cords under direct vision, CO2 detector and Breath sounds checked- equal and bilateral Secured at: 19 cm Tube secured with: ETT holder Difficulty Due To: Difficulty was unanticipated       MEDICATIONS ORDERED IN ED: Medications  lactated ringers  infusion ( Intravenous New  Bag/Given 03/10/24 1214)  fentaNYL  in NS (79mcg/ml) infusion-PREMIX (25 mcg/hr Intravenous New Bag/Given 03/10/24 1408)  naloxone  (NARCAN ) 4 MG/0.1ML nasal spray kit (4 mg  Provided for home use 03/10/24 1041)  norepinephrine (LEVOPHED) 4-5 MG/250ML-% infusion SOLN (1 mcg/min  Rate/Dose Change 03/10/24 1222)  rocuronium (ZEMURON) 100 MG/10ML injection (100 mg  Given 03/10/24 1058)  etomidate (AMIDATE) 2 MG/ML injection (  Given 03/10/24 1058)  ceFEPIme (MAXIPIME) 2 g in sodium chloride  0.9 % 100 mL IVPB (0 g Intravenous Stopped 03/10/24 1212)  metroNIDAZOLE (FLAGYL) IVPB 500 mg (0 mg Intravenous Stopped 03/10/24 1212)  vancomycin (VANCOCIN) IVPB 1000 mg/200 mL premix (0 mg Intravenous Stopped 03/10/24 1342)  sodium chloride  0.9 % bolus 2,721 mL (721 mLs Intravenous New Bag/Given 03/10/24 1345)  IMPRESSION / MDM / ASSESSMENT AND PLAN / ED COURSE  I reviewed the triage vital signs and the nursing notes.                              Differential diagnosis includes, but is not limited to, sepsis, ICH, polysubstance overdose, electrolyte abnormality  Patient's presentation is most consistent with acute presentation with potential threat to life or bodily function.  Patient is a 53 year old female presenting today for unresponsiveness.  On arrival she is bradycardic and hypotensive.  Chart review does not show any active medications for beta-blockers or calcium channel blockers.  Given 2 L of fluid immediately.  Placed on Levophed.  Patient started to get more bradycardic into the 20s with difficulty finding a pulse.  Briefly had chest compressions for about 8 seconds before ultrasound was able to find pulse in the right femoral.  Subsequently intubated with improvement in her blood pressure.  Given atropine with improvement in her heart rate.  Tried Narcan  prior to intubation given chronic pain and on opioids with no significant change.  Patient subsequently was able to come off all Levophed and  maintain pressure following intubation.  CT head and C-spine negative.  Chest x-ray otherwise reassuring.  Laboratory workup for the most part is largely reassuring outside of elevated CK which I suspect is from her being down.  Discussed case with ICU for admission initially.  Given that there is some concern for potential seizure, ICU and neurology recommending transfer to [facility] for continuous EEG.  [facility] has agreed to accept patient for further care.  The patient is on the cardiac monitor to evaluate for evidence of arrhythmia and/or significant heart rate changes. Clinical Course as of 03/10/24 1417  Tue Mar 10, 2024  1338 Dr. Zaida Hertz with [facility] agrees to accept for transfer [DW]    Clinical Course User Index [DW] Kandee Orion, MD     FINAL CLINICAL IMPRESSION(S) / ED DIAGNOSES   Final diagnoses:  Unresponsive  Elevated CK     Rx / DC Orders   ED Discharge Orders     None        Note:  This document was prepared using Dragon voice recognition software and may include unintentional dictation errors.   Kandee Orion, MD 03/10/24 [phone]

## 2024-03-10 NOTE — H&P (Signed)
 NAMEAzarie Benitez, MRN:  [id number], DOB:  11-19-1970, LOS: 0 ADMISSION DATE:  03/10/2024, CONSULTATION DATE:  03/10/2024 REFERRING MD:  ED - [facility], CHIEF COMPLAINT:  Unresponsiveness.    History of Present Illness:  53 year old woman found unresponsive in urine and feces at home.  History of MS and prior seizures but no clear seizure activity reported.  Prior history of opiate abuse.   Intubated at [facility].  Was apparently awake prior to leaving [facility]  Pertinent  Medical History   Past Medical History:  Diagnosis Date   Acute postoperative pain 04/30/2019   Diabetes mellitus without complication (HCC)    GERD (gastroesophageal reflux disease)    Headache    daily   MS (multiple sclerosis) (HCC)    Numbness and tingling of both legs below knees    pt reports secondary to MS   Osteoarthritis    lumbar   Vertigo    last episode opver 18 yrs ago   Wears dentures    full upper and lower     Significant Hospital Events: Including procedures, antibiotic start and stop dates in addition to other pertinent events   5/6 - admitted in transfer from [facility]  Interim History / Subjective:  More awake on arrival.   Objective   Blood pressure (!) 150/91, pulse 71, resp. rate (!) 23, SpO2 100%.    Vent Mode: PRVC FiO2 (%):  [40 %-50 %] 40 % Set Rate:  [22 bmp] 22 bmp Vt Set:  [450 mL] 450 mL PEEP:  [5 cmH20-8 cmH20] 5 cmH20 Plateau Pressure:  [16 cmH20-20 cmH20] 16 cmH20  No intake or output data in the 24 hours ending 03/10/24 1710 There were no vitals filed for this visit.  Examination: General: Appears stated age HENT: orally intubated. OGT in place.  Lungs: chest clear and tolerating PSV Cardiovascular: HS normal  Abdomen: soft Extremities: no edema.  Neuro: opening eyes and masticating on tube. Moves to pain and mouth 'ow' GU: Foley in place  Ancillary Tests personally reviewed.   Hypokalemia 2.9 Lactic acidosis has cleared. CBC is normal  UDS positive for  barbiturates and TCA.  Assessment & Plan:  Unresponsiveness likely due to drug ingestion.  Multiple sclerosis followed at [facility] - relapsing/remitting, with normal exam when in remission.  Chronic pain on oxycodone  at home.   Plan:   - no clear seizure today and patient is waking up. - stop sedation and attempt extubate.   Best Practice (right click and "Reselect all SmartList Selections" daily)   Diet/type: NPO DVT prophylaxis prophylactic heparin  Pressure ulcer(s): N/A GI prophylaxis: N/A Lines: N/A Foley:  N/A Code Status:  full code Last date of multidisciplinary goals of care discussion [pending]    CRITICAL CARE Performed by: Arlina Lair   Total critical care time: 45 minutes  Critical care time was exclusive of separately billable procedures and treating other patients.  Critical care was necessary to treat or prevent imminent or life-threatening deterioration.  Critical care was time spent personally by me on the following activities: development of treatment plan with patient and/or surrogate as well as nursing, discussions with consultants, evaluation of patient's response to treatment, examination of patient, obtaining history from patient or surrogate, ordering and performing treatments and interventions, ordering and review of laboratory studies, ordering and review of radiographic studies, pulse oximetry, re-evaluation of patient's condition and participation in multidisciplinary rounds.  Arlina Lair, MD [facility] ICU Physician [facility] [place] Critical Care  Pager: [phone] Mobile: [phone]  After hours: [phone].

## 2024-03-10 NOTE — ED Notes (Signed)
 Pt HR 36 showing VTACH on monitor. Pads placed

## 2024-03-10 NOTE — Consult Note (Signed)
 NEUROLOGY CONSULT NOTE   Date of service: Mar 10, 2024 Patient Name: Shannon Benitez MRN:  [id number] DOB:  [birth year] Chief Complaint: "Possible seizure" Requesting Provider: Trevor Fudge, MD  History of Present Illness  Shannon Benitez is a 53 y.o. female with hx of MS and prior seizure who was found down and hypotensive in her house today.  She has previously been on Keppra and lamotrigine.  She is on lamotrigine 50 every morning and 150 every evening,  levetiracetam 500 mg twice daily,primidone 100 mg bid, Lyrica  150 3 times daily.  She is also on baclofen , oxycodone , sertraline, propranolol.  Given that she was unresponsive, and continuous EEG was not available at [facility], she was transferred to [facility] for continuous EEG  Past History   Past Medical History:  Diagnosis Date   Acute postoperative pain 04/30/2019   Diabetes mellitus without complication (HCC)    GERD (gastroesophageal reflux disease)    Headache    daily   MS (multiple sclerosis) (HCC)    Numbness and tingling of both legs below knees    pt reports secondary to MS   Osteoarthritis    lumbar   Vertigo    last episode opver 18 yrs ago   Wears dentures    full upper and lower    Past Surgical History:  Procedure Laterality Date   ABDOMINAL HYSTERECTOMY     CATARACT EXTRACTION W/PHACO Right 03/18/2019   Procedure: CATARACT EXTRACTION PHACO AND INTRAOCULAR LENS PLACEMENT (IOC) RIGHT;  Surgeon: Rosa College, MD;  Location: [facility];  Service: Ophthalmology;  Laterality: Right;  Diabetic - oral meds Latex sensitivity   CATARACT EXTRACTION W/PHACO Left 04/13/2019   Procedure: CATARACT EXTRACTION PHACO AND INTRAOCULAR LENS PLACEMENT (IOC)  LEFT;  Surgeon: Rosa College, MD;  Location: [facility];  Service: Ophthalmology;  Laterality: Left;  Diabetic - oral meds   CESAREAN SECTION     TUMOR REMOVAL Left    pt was 53 years old    Family History: Family History   Adopted: Yes    Social History  reports that she quit smoking about 7 years ago. Her smoking use included cigarettes. She started smoking about 25 years ago. She has a 9 pack-year smoking history. She has quit using smokeless tobacco. She reports that she does not drink alcohol. No history on file for drug use.  Allergies  Allergen Reactions   5-Alpha Reductase Inhibitors    Desvenlafaxine     Allergic reaction   Glatiramer Other (See Comments)    Pt states me gave her cold spells.    Rebif [Interferon Beta-1a] Other (See Comments)    The skin around the injection site turned black   Gramineae Pollens Itching   Latex Itching    Only when gloves are worn by pt   Tramadol  Diarrhea, Nausea And Vomiting and Anxiety    Medications   Current Facility-Administered Medications:    Chlorhexidine Gluconate Cloth 2 % PADS 6 each, 6 each, Topical, Daily, Chand, Sudham, MD, 6 each at 03/10/24 1716   docusate (COLACE) 50 MG/5ML liquid 100 mg, 100 mg, Per Tube, BID, Alayne Hubert, Stephanie M, PA-C   docusate sodium (COLACE) capsule 100 mg, 100 mg, Oral, BID PRN, Alayne Hubert, Stephanie M, PA-C   fentaNYL  (SUBLIMAZE ) bolus via infusion 50-100 mcg, 50-100 mcg, Intravenous, Q15 min PRN, Alayne Hubert, Stephanie M, PA-C   fentaNYL  (SUBLIMAZE ) injection 50 mcg, 50 mcg, Intravenous, Once, Alayne Hubert, Stephanie M, PA-C   fentaNYL  in NS (  73mcg/ml) infusion-PREMIX, 50-200 mcg/hr, Intravenous, Continuous, Shelli Dexter M, [place]   [START ON 03/11/2024] heparin injection 5,000 Units, 5,000 Units, Subcutaneous, Q8H, Alayne Hubert, Stephanie M, PA-C   insulin aspart (novoLOG) injection 0-9 Units, 0-9 Units, Subcutaneous, Q4H, Shelli Dexter M, PA-C, 1 Units at Mar 15, 2024 2130   lactated ringers  infusion, , Intravenous, Continuous, Gleason, Patt Boozer, PA-C, Last Rate: 75 mL/hr at 2024-03-15 1823, Infusion Verify at 03-15-2024 1823   Oral care mouth rinse, 15 mL, Mouth Rinse, PRN, Chand, Sudham, MD   pantoprazole (PROTONIX) injection 40  mg, 40 mg, Intravenous, QHS, Shelli Dexter M, PA-C, 40 mg at 03/15/2024 2139   polyethylene glycol (MIRALAX / GLYCOLAX) packet 17 g, 17 g, Oral, Daily PRN, Alayne Hubert, Stephanie M, PA-C   polyethylene glycol (MIRALAX / GLYCOLAX) packet 17 g, 17 g, Per Tube, Daily, Shelli Dexter M, PA-C   potassium chloride 10 mEq in 100 mL IVPB, 10 mEq, Intravenous, Q1H, Ogan, Okoronkwo U, MD, Last Rate: 100 mL/hr at 03-15-2024 2333, 10 mEq at 03/15/2024 2333  Vitals   Vitals:   Mar 15, 2024 2000 03/15/24 2031 2024/03/15 2100 03/15/24 2200  BP: 121/70 118/67 113/66 (!) 91/50  Pulse: 79 76 76 70  Resp: 17 14 15 15   Temp: 99 F (37.2 C) 99.5 F (37.5 C) 99.5 F (37.5 C) 99.7 F (37.6 C)  TempSrc:      SpO2: 98% 98% 97% 97%  Weight:      Height:        Body mass index is 37.72 kg/m.  Physical Exam   Just recently extubated Neurologic Examination    Neuro: Mental Status: Patient is awake, she really follows commands to stick out her tongue and wiggle toes, but not show thumbs up.  She repeatedly states "what?" Cranial Nerves: II: Blinks to threat bilaterally.  III,IV, VI: EOMI without ptosis or diploplia.  V: Facial sensation is symmetric to temperature VII: Facial movement is symmetric.  VIII: hearing is intact to voice X: Uvula elevates symmetrically XII: tongue is midline without atrophy or fasciculations.  Motor: Tone is normal. Bulk is normal. 5/5 strength was present in all four extremities.  Sensory: She response to minor stimulation bilaterally  Cerebellar: She does not perform       Labs/Imaging/Neurodiagnostic studies   CBC:  Recent Labs  Lab 03/15/24 1044 03/15/2024 1904  WBC 5.5 7.5  NEUTROABS 4.2 6.3  HGB 13.5 13.1  HCT 42.2 38.9  MCV 89.8 88.0  PLT 178 171   Basic Metabolic Panel:  Lab Results  Component Value Date   NA 138 Mar 15, 2024   K 2.5 (LL) Mar 15, 2024   CO2 21 (L) 03/15/2024   GLUCOSE 115 (H) 03/15/2024   BUN 10 2024-03-15   CREATININE 1.08 (H)  03/15/24   CALCIUM 8.5 (L) 2024/03/15   GFRNONAA >60 2024/03/15   GFRAA >60 09/25/2019   Lipid Panel: No results found for: "LDLCALC" HgbA1c: No results found for: "HGBA1C" Urine Drug Screen:     Component Value Date/Time   LABOPIA NONE DETECTED 03/15/24 1126   COCAINSCRNUR NONE DETECTED 03-15-2024 1126   LABBENZ NONE DETECTED 03/15/2024 1126   AMPHETMU NONE DETECTED 03-15-2024 1126   THCU NONE DETECTED Mar 15, 2024 1126   LABBARB POSITIVE (A) 03-15-2024 1126    Alcohol Level     Component Value Date/Time   ETH <15 15-Mar-2024 1308   INR  Lab Results  Component Value Date   INR 1.1 Mar 15, 2024   APTT No results found for: "APTT" AED levels: No results found for: "PHENYTOIN", "  ZONISAMIDE", "LAMOTRIGINE", "LEVETIRACETA"  CT Head without contrast(Personally reviewed): Negative   ASSESSMENT   Gabriella Mathieu is a 53 y.o. female with a history of multiple sclerosis as well as seizures on a very large number of very sedating medications who was found unresponsive and hypotensive.  I agree that ingestion is the most likely explanation for her current presentation, however I agree with continuous EEG monitoring.  Her current exam does seem encephalopathic, but could also be aphasia and therefore I would also favor getting an MRI of her brain.  As she improves, we will need to reintroduce her antiepileptics, particularly primidone as this can cause withdrawal seizures, and I am not sure which one she stopped recently.  I do not know that we have enough information, unless EEG is abnormal, to suggest that today was a seizure requiring reinstitution of her previous antiepileptic.  RECOMMENDATIONS  MRI brain Continuous EEG Restart home antiepileptics once more awake Lamotrigine and phenobarbital levels Neurology will follow ______________________________________________________________________    Signed, Ann Keto, MD [facility]

## 2024-03-10 NOTE — ED Notes (Signed)
 Pt connected to low intermittent suction per Dr. Karlynn Oyster

## 2024-03-10 NOTE — Progress Notes (Signed)
   03/10/24 1110  Spiritual Encounters  Type of Visit Initial  Care provided to: Family (Son Lanell Pinta in family waiting)  Sissy Duff partners present during Programmer, systems (Nurse pulled chaplain aside to let her know the situation so she could speak with the family)  Referral source Nurse (RN/NT/LPN)  Reason for visit Routine spiritual support  OnCall Visit No  Interventions  Spiritual Care Interventions Made Established relationship of care and support;Compassionate presence  Intervention Outcomes  Outcomes Connection to spiritual care;Awareness around self/spiritual resourses  Spiritual Care Plan  Spiritual Care Issues Still Outstanding Chaplain will continue to follow   Chaplain met with patients son, Lanell Pinta, in the family room along with Dr. Karlynn Oyster.

## 2024-03-10 NOTE — Procedures (Signed)
 Extubation Procedure Note  Patient Details:   Name: Shannon Benitez DOB: Dec 26, 1970 MRN: [id number]   Airway Documentation:    Vent end date: 03/10/24 Vent end time: 2015   Evaluation  O2 sats: stable throughout Complications: No apparent complications Patient did tolerate procedure well. Bilateral Breath Sounds: Clear   Yes   Pt extubated per MD order to  2L [name]. Pt tolerated procedure well.  [name] 03/10/2024, 8:16 PM

## 2024-03-10 NOTE — ED Notes (Addendum)
 Weak pulse found in groin Right side by Dr Karlynn Oyster

## 2024-03-10 NOTE — Sepsis Progress Note (Signed)
 Notified provider of need to order fluid bolus pt needs 2721 cc fluid per protocol.

## 2024-03-10 NOTE — Progress Notes (Signed)
 CODE SEPSIS - PHARMACY COMMUNICATION  **Broad Spectrum Antibiotics should be administered within 1 hour of Sepsis diagnosis**  Time Code Sepsis Called/Page Received: 11:05  Antibiotics Ordered: Cefepime, Vancomycin, Metronidazole  Time of 1st antibiotic administration: Cefepime given at 11:35  Additional action taken by pharmacy: n/a  If necessary, Name of Provider/Nurse Contacted: n/a    Janette Medley ,PharmD Clinical Pharmacist  03/10/2024  11:41 AM

## 2024-03-10 NOTE — Progress Notes (Signed)
 eLink Physician-Brief Progress Note Patient Name: Shannon Benitez DOB: 05-22-1971 MRN: [id number]   Date of Service  03/10/2024  HPI/Events of Note  Patient is alert and following commands. She is on CPAP with good tidal volumes.  eICU Interventions  Extubation ordered,        Roxana Lai U Natonya Finstad 03/10/2024, 7:45 PM

## 2024-03-10 NOTE — ED Notes (Signed)
 Dr wells placed ET tube, color change noted, 19 at the lip

## 2024-03-10 NOTE — ED Notes (Signed)
CBG 96. 

## 2024-03-10 NOTE — ED Notes (Signed)
Etom given

## 2024-03-10 NOTE — Plan of Care (Signed)
 Patient arrived from [facility] at 1730.  Opens eyes and tracks to command but no arm or leg purposeful movement.  CCM wanted to extubate but still not following any commands with extremities.

## 2024-03-10 NOTE — ED Notes (Signed)
Levophed paused

## 2024-03-10 NOTE — ED Notes (Signed)
 1L of NS and 1 L LR given and stopped at this time

## 2024-03-10 NOTE — Progress Notes (Signed)
   03/10/24 1515  Spiritual Encounters  Type of Visit Follow up  Care provided to: [facility] partners present during encounter Nurse  Reason for visit Routine spiritual support  OnCall Visit No   Chaplain followed up with son, Lanell Pinta, right before patient transferred to [facility].

## 2024-03-10 NOTE — H&P (Shared)
 NAMEAryiana Benitez, MRN:  [id number], DOB:  1971-08-12, LOS: 0 ADMISSION DATE:  (Not on file) CONSULTATION DATE:  03/10/2024 REFERRING MD:  Karlynn Oyster - EDP [facility]),  CHIEF COMPLAINT:  Unresponsive, c/f seizures   History of Present Illness:  53 year old woman who presented to [facility] 5/6 as a transfer from [facility] ED for unresponsiveness, possible seizures. PMHx significant for MS (relapse remitting, c/b optic neuritis/neuropathy/spasticity, followed at [facility]), OAB, history of seizures (on Keppra/Lamictal), T2DM, IDA (followed by Heme/Onc), GERD, anxiety/depression, chronic pain, OA.  Patient initially presented to [facility] ED via EMS after being found unresponsive 5/6AM by son. Unknown downtime; patient was noted to have urinary/fecal incontinence and known remote history of seizures with AEDs stopped 1 week PTA. Per patient's son, multiple falls in the last several weeks 2/2 MS, no LOC but did injure her head/neck (last fall 5/5PM ~1900). Patient has also had poor PO intake x 2 weeks.   GCS 3 on EMS arrival. Patient was also noted to be bradycardic to 40s and hypotensive with SBP 70s. CBG 91. Labs were notable for normal CBC, Na 137, K 2.9, CO2 25, Cr 1.32 (baseline 0.6), Mg 1.7. LFTs WNL. CK 1000. LA 2.1 > 1.7. COVID/Flu/RSV negative. ABG WNL. UA grossly unremarkable. UDS +TCAs, barbituates (on home Primidone), CT Head NAICA, +chronic white matter changes c/w demyelinating disease. CT C-Spine WNL. Narcan  administered with no significant effect. Fluid resuscitation + Levophed started when patient became profoundly bradycardic to 20s with difficulty finding pulse; CPR x 8 seconds completed before US  confirmed femoral pulse. Intubated for airway protection with subsequent improvement in hypotension. Atropine was given for bradycardia. Broad-spectrum antibiotics initiated for ?sepsis. Neuro consulted with recommendation for LTM EEG and transfer to [facility].  PCCM consulted for ICU transfer/admission.  Pertinent Medical  History:   Past Medical History:  Diagnosis Date   Acute postoperative pain 04/30/2019   Diabetes mellitus without complication (HCC)    GERD (gastroesophageal reflux disease)    Headache    daily   MS (multiple sclerosis) (HCC)    Numbness and tingling of both legs below knees    pt reports secondary to MS   Osteoarthritis    lumbar   Vertigo    last episode opver 18 yrs ago   Wears dentures    full upper and lower   Significant Hospital Events: Including procedures, antibiotic start and stop dates in addition to other pertinent events   5/6 - Presented to [facility] via EMS for unresponsiveness. +Urinary/fecal incontinence, GCS 3. Recent falls. CT Head NAICA, +chronic white matter changes c/w demyelinating disease. CT C-Spine WNL. Intubated for airway protection.  Interim History / Subjective:  ***  Objective:  There were no vitals taken for this visit.    Vent Mode: PRVC FiO2 (%):  [50 %] 50 % Set Rate:  [22 bmp] 22 bmp Vt Set:  [450 mL] 450 mL PEEP:  [8 cmH20] 8 cmH20 Plateau Pressure:  [20 cmH20] 20 cmH20  No intake or output data in the 24 hours ending 03/10/24 1352 There were no vitals filed for this visit.  Physical Examination: General: {SRACUITY:25313} ill-appearing *** in NAD. HEENT: [place]/AT, anicteric sclera, PERRL, moist mucous membranes. Neuro: {SRLOC:25308} {SRSTIMULI:25309} {SRCOMMANDS:25310} {SRNEUROEXTREMITIES:25312} Strength ***/5 in *** extremities. {SRRBRAINSTEM:25311}  CV: RRR, no m/g/r. PULM: Breathing even and unlabored on ***. Lung fields ***. GI: Soft, nontender, nondistended. Normoactive bowel sounds. Extremities: *** LE edema noted. Skin: Warm/dry, ***.  Resolved Hospital Problem List:    Assessment & Plan:  Unresponsiveness,  c/f possible seizure History of seizures, remote Recent falls AEDs recently discontinued by Neurology***. CT Head NAICA, +chronic white matter changes c/w demyelinating disease. CT C-spine WNL. UDS +TCAs and barbs. -  Neuro following, appreciate recommendations - LTM EEG - AEDs per Neuro (***), *** PRN - Seizure precautions - Neuroprotective measures: HOB > 30 degrees, normoglycemia, normothermia, electrolytes WNL  Respiratory insufficiency in the setting of unresponsiveness, ?seizure Intubated for airway protection, GCS 3 on ED arrival. 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 - Home Tecfidera - Home Myrbetriq   AKI likely 2/2 mild rhabdo Elevated CK - Trend BMP - Replete electrolytes as indicated - Aggressive fluid resuscitation - Monitor I&Os - Follow CK - Avoid nephrotoxic agents as able - Ensure adequate renal perfusion  T2DM - SSI - CBGs Q4H - Goal CBG 140-180  Anxiety Depression Chronic pain syndrome   Best Practice: (right click and "Reselect all SmartList Selections" daily)   Diet/type: NPO DVT prophylaxis: SCDs, SQH GI prophylaxis: PPI Lines: N/A Foley:  Yes, and it is still needed Code Status:  full code Last date of multidisciplinary goals of care discussion [Pending]  Labs:  CBC: Recent Labs  Lab 03/10/24 1044  WBC 5.5  NEUTROABS 4.2  HGB 13.5  HCT 42.2  MCV 89.8  PLT 178   Basic Metabolic Panel: Recent Labs  Lab 03/10/24 1044  NA 137  K 2.9*  CL 102  CO2 25  GLUCOSE 101*  BUN 13  CREATININE 1.32*  CALCIUM 9.2  MG 1.7  PHOS 4.3   GFR: Estimated Creatinine Clearance: 52.7 mL/min (A) (by C-G formula based on SCr of 1.32 mg/dL (H)). Recent Labs  Lab 03/10/24 1044  WBC 5.5  LATICACIDVEN 2.1*   Liver Function Tests: Recent Labs  Lab 03/10/24 1044  AST 31  ALT 14  ALKPHOS 65  BILITOT 0.7  PROT 7.1  ALBUMIN 3.5   No results for input(s):  "LIPASE", "AMYLASE" in the last 168 hours. No results for input(s): "AMMONIA" in the last 168 hours.  ABG:    Component Value Date/Time   PHART 7.41 03/10/2024 1204   PCO2ART 34 03/10/2024 1204   PO2ART 96 03/10/2024 1204   HCO3 21.6 03/10/2024 1204   ACIDBASEDEF 2.4 (H) 03/10/2024 1204   O2SAT 100 03/10/2024 1204    Coagulation Profile: No results for input(s): "INR", "PROTIME" in the last 168 hours.  Cardiac Enzymes: Recent Labs  Lab 03/10/24 1134  CKTOTAL 1,000*   HbA1C: No results found for: "HGBA1C"  CBG: Recent Labs  Lab 03/10/24 1043  GLUCAP 96   Review of Systems:   Patient is encephalopathic and/or intubated; therefore, history has been obtained from chart review.   Past Medical History:  She,  has a past medical history of Acute postoperative pain (04/30/2019), Diabetes mellitus without complication (HCC), GERD (gastroesophageal reflux disease), Headache, MS (multiple sclerosis) (HCC), Numbness and tingling of both legs below knees, Osteoarthritis, Vertigo, and Wears dentures.   Surgical History:   Past Surgical History:  Procedure Laterality Date   ABDOMINAL HYSTERECTOMY     CATARACT EXTRACTION W/PHACO Right 03/18/2019   Procedure: CATARACT EXTRACTION PHACO AND INTRAOCULAR LENS PLACEMENT (IOC) RIGHT;  Surgeon: Rosa College, MD;  Location: [facility];  Service: Ophthalmology;  Laterality: Right;  Diabetic - oral meds Latex sensitivity   CATARACT EXTRACTION W/PHACO Left 04/13/2019   Procedure: CATARACT EXTRACTION PHACO AND INTRAOCULAR LENS PLACEMENT (IOC)  LEFT;  Surgeon: Rosa College, MD;  Location: [facility];  Service: Ophthalmology;  Laterality: Left;  Diabetic - oral meds   CESAREAN SECTION     TUMOR REMOVAL Left    pt was 53 years old    Social History:   reports that she quit smoking about 7 years ago. Her smoking use included cigarettes. She started smoking about 25 years ago. She has a 9 pack-year smoking history. She has  quit using smokeless tobacco. She reports that she does not drink alcohol.   Family History:  Her family history is not on file. She was adopted.   Allergies: Allergies  Allergen Reactions   5-Alpha Reductase Inhibitors    Desvenlafaxine     Allergic reaction   Glatiramer Other (See Comments)    Pt states me gave her cold spells.    Rebif [Interferon Beta-1a] Other (See Comments)    The skin around the injection site turned black   Gramineae Pollens Itching   Latex Itching    Only when gloves are worn by pt   Tramadol  Diarrhea, Nausea And Vomiting and Anxiety   Home Medications: Prior to Admission medications   Medication Sig Start Date End Date Taking? Authorizing Provider  aspirin-acetaminophen -caffeine (EXCEDRIN MIGRAINE) 250-250-65 MG tablet Take by mouth every 6 (six) hours as needed for headache.    [provider]  baclofen  (LIORESAL ) 20 MG tablet Take 1 tablet (20 mg total) by mouth 4 (four) times daily. 08/16/20 08/21/23  Renaldo Caroli, MD  Cholecalciferol 1.25 MG (50000 UT) capsule Take 50,000 Units by mouth once a week. 03/19/23   [provider]  Dimethyl Fumarate 240 MG CPDR Take by mouth. 08/06/22   [provider]  lamoTRIgine (LAMICTAL) 25 MG tablet Take 50 mg by mouth 2 (two) times daily. 11/23/16   [provider]  levETIRAcetam (KEPPRA) 250 MG tablet Take by mouth. 05/10/22 08/21/23  [provider]  loperamide (IMODIUM A-D) 2 MG tablet Take 2 mg by mouth as needed for diarrhea or loose stools.    [provider]  meloxicam  (MOBIC ) 15 MG tablet Take 1 tablet by mouth daily. 05/10/22 08/21/23  [provider]  metFORMIN (GLUCOPHAGE-XR) 500 MG 24 hr tablet Take 250 mg by mouth daily with breakfast.  04/03/19 08/21/23  [provider]  mirabegron ER (MYRBETRIQ) 50 MG TB24 tablet Take 50 mg by mouth daily.  02/13/19   [provider]  naloxone  (NARCAN ) nasal spray 4 mg/0.1 mL Place 1 spray  into the nose as needed for up to 365 doses (for opioid-induced respiratory depresssion). In case of emergency (overdose), spray once into each nostril. If no response within 3 minutes, repeat application and call 911. 02/17/24 02/10/25  Renaldo Caroli, MD  omeprazole (PRILOSEC) 20 MG capsule Take 1 capsule by mouth daily. 05/10/22 08/21/23  [provider]  oxyCODONE  (OXY IR/ROXICODONE ) 5 MG immediate release tablet Take 1 tablet (5 mg total) by mouth every 6 (six) hours as needed for severe pain (pain score 7-10). Must last 30 days. 02/17/24 03/18/24  Renaldo Caroli, MD  oxyCODONE  (OXY IR/ROXICODONE ) 5 MG immediate release tablet Take 1 tablet (5 mg total) by mouth every 6 (six) hours as needed for severe pain (pain score 7-10). Must last 30 days. 03/18/24 04/17/24  Renaldo Caroli, MD  oxyCODONE  (OXY IR/ROXICODONE ) 5 MG immediate release tablet Take 1 tablet (5 mg total) by mouth every 6 (six) hours as needed for severe pain (pain score 7-10). Must last 30 days. 04/17/24 05/17/24  Renaldo Caroli, MD  pravastatin (PRAVACHOL) 20 MG tablet Take 1 tablet by mouth daily. 07/03/23 07/02/24  [provider]  pregabalin  (LYRICA ) 50 MG capsule Take 50 mg by mouth 2 (two) times daily. 07/07/22 08/21/23  [provider]  primidone (MYSOLINE) 50 MG tablet Take 50 mg by mouth 2 (two) times daily. 2 tablets twice per day 05/10/22 08/21/23  [provider]  propranolol (INDERAL) 40 MG tablet Take by mouth. 05/10/22 08/21/23  [provider]  sertraline (ZOLOFT) 100 MG tablet Take 100 mg by mouth daily.    [provider]  sitaGLIPtin (JANUVIA) 100 MG tablet Take 1 tablet by mouth daily. 05/10/22 08/21/23  [provider]    Critical care time:   The patient is critically ill with multiple organ system failure and requires high complexity decision making for assessment and support, frequent evaluation and titration of therapies, advanced monitoring, review of  radiographic studies and interpretation of complex data.   Critical Care Time devoted to patient care services, exclusive of separately billable procedures, described in this note is *** minutes.  Star East, PA-C [facility] 03/10/24 1:52 PM  Please see [email or web address] for pager details.  From 7A-7P if no response, please call [phone] After hours, please call ELink [phone]

## 2024-03-10 NOTE — Sepsis Progress Note (Signed)
 Elink monitoring for the code sepsis protocol.

## 2024-03-11 ENCOUNTER — Inpatient Hospital Stay (HOSPITAL_COMMUNITY): Payer: MEDICAID

## 2024-03-11 ENCOUNTER — Other Ambulatory Visit: Payer: Self-pay

## 2024-03-11 ENCOUNTER — Encounter (HOSPITAL_COMMUNITY): Payer: Self-pay | Admitting: Internal Medicine

## 2024-03-11 DIAGNOSIS — I9589 Other hypotension: Secondary | ICD-10-CM

## 2024-03-11 DIAGNOSIS — G9340 Encephalopathy, unspecified: Secondary | ICD-10-CM | POA: Diagnosis not present

## 2024-03-11 DIAGNOSIS — R569 Unspecified convulsions: Secondary | ICD-10-CM

## 2024-03-11 DIAGNOSIS — R4189 Other symptoms and signs involving cognitive functions and awareness: Secondary | ICD-10-CM | POA: Diagnosis not present

## 2024-03-11 LAB — GLUCOSE, CAPILLARY
Glucose-Capillary: 126 mg/dL — ABNORMAL HIGH (ref 70–99)
Glucose-Capillary: 118 mg/dL — ABNORMAL HIGH (ref 70–99)
Glucose-Capillary: 217 mg/dL — ABNORMAL HIGH (ref 70–99)
Glucose-Capillary: 127 mg/dL — ABNORMAL HIGH (ref 70–99)

## 2024-03-11 LAB — CBC
HCT: 39.8 % (ref 36.0–46.0)
Hemoglobin: 13.3 g/dL (ref 12.0–15.0)
MCH: 28.9 pg (ref 26.0–34.0)
MCHC: 33.4 g/dL (ref 30.0–36.0)
MCV: 86.5 fL (ref 80.0–100.0)
Platelets: 206 10*3/uL (ref 150–400)
RBC: 4.6 MIL/uL (ref 3.87–5.11)
RDW: 15.7 % — ABNORMAL HIGH (ref 11.5–15.5)
WBC: 6.9 10*3/uL (ref 4.0–10.5)
nRBC: 0 % (ref 0.0–0.2)

## 2024-03-11 LAB — COMPREHENSIVE METABOLIC PANEL WITH GFR
ALT: 15 U/L (ref 0–44)
AST: 27 U/L (ref 15–41)
Albumin: 3.1 g/dL — ABNORMAL LOW (ref 3.5–5.0)
Alkaline Phosphatase: 74 U/L (ref 38–126)
Anion gap: 13 (ref 5–15)
BUN: 5 mg/dL — ABNORMAL LOW (ref 6–20)
CO2: 25 mmol/L (ref 22–32)
Calcium: 9.1 mg/dL (ref 8.9–10.3)
Chloride: 105 mmol/L (ref 98–111)
Creatinine, Ser: 0.92 mg/dL (ref 0.44–1.00)
GFR, Estimated: >60 mL/min (ref >60)
Glucose, Bld: 133 mg/dL — ABNORMAL HIGH (ref 70–99)
Potassium: 3.2 mmol/L — ABNORMAL LOW (ref 3.5–5.1)
Sodium: 143 mmol/L (ref 135–145)
Total Bilirubin: 1 mg/dL (ref 0.0–1.2)
Total Protein: 6.7 g/dL (ref 6.5–8.1)

## 2024-03-11 LAB — ECHOCARDIOGRAM COMPLETE
Area-P 1/2: 2.93 cm2
Height: 63 in
S' Lateral: 3 cm
Weight: 3333.36 [oz_av]

## 2024-03-11 LAB — PHENOBARBITAL LEVEL: Phenobarbital: 5.3 ug/mL — ABNORMAL LOW (ref 15.0–40.0)

## 2024-03-11 LAB — MAGNESIUM: Magnesium: 2.2 mg/dL (ref 1.7–2.4)

## 2024-03-11 LAB — CK: Total CK: 901 U/L — ABNORMAL HIGH (ref 38–234)

## 2024-03-11 MED ORDER — POLYETHYLENE GLYCOL 3350 17 G PO PACK: 17.0000 g | PACK | Freq: Every day | ORAL | Status: DC

## 2024-03-11 MED ORDER — SERTRALINE HCL 50 MG PO TABS
50.0000 mg | ORAL_TABLET | Freq: Every day | ORAL | Status: DC
  Administered 2024-03-11 – 2024-03-14 (×4): 50 mg via ORAL
  Filled 2024-03-11 (×4): qty 1

## 2024-03-11 MED ORDER — PRIMIDONE 50 MG PO TABS
50.0000 mg | ORAL_TABLET | Freq: Two times a day (BID) | ORAL | Status: DC
  Administered 2024-03-11 – 2024-03-13 (×6): 50 mg via ORAL
  Filled 2024-03-11 (×7): qty 1

## 2024-03-11 MED ORDER — PERFLUTREN LIPID MICROSPHERE
1.0000 mL | INTRAVENOUS | Status: AC | PRN
  Administered 2024-03-11: 3 mL via INTRAVENOUS

## 2024-03-11 MED ORDER — MIRABEGRON ER 25 MG PO TB24
50.0000 mg | ORAL_TABLET | Freq: Every day | ORAL | Status: DC
  Administered 2024-03-11: 50 mg via ORAL
  Filled 2024-03-11: qty 1

## 2024-03-11 MED ORDER — PREGABALIN 25 MG PO CAPS
50.0000 mg | ORAL_CAPSULE | Freq: Two times a day (BID) | ORAL | Status: DC
  Administered 2024-03-11 – 2024-03-14 (×7): 50 mg via ORAL
  Filled 2024-03-11 (×7): qty 2

## 2024-03-11 MED ORDER — LEVETIRACETAM 500 MG PO TABS
500.0000 mg | ORAL_TABLET | Freq: Two times a day (BID) | ORAL | Status: DC
  Administered 2024-03-11 – 2024-03-14 (×7): 500 mg via ORAL
  Filled 2024-03-11 (×7): qty 1

## 2024-03-11 MED ORDER — POTASSIUM CHLORIDE CRYS ER 20 MEQ PO TBCR
40.0000 meq | EXTENDED_RELEASE_TABLET | Freq: Two times a day (BID) | ORAL | Status: AC
  Administered 2024-03-11 (×2): 40 meq via ORAL
  Filled 2024-03-11 (×2): qty 2

## 2024-03-11 MED ORDER — INSULIN ASPART 100 UNIT/ML IJ SOLN
0.0000 [IU] | Freq: Three times a day (TID) | INTRAMUSCULAR | Status: DC
  Administered 2024-03-11: 3 [IU] via SUBCUTANEOUS
  Administered 2024-03-11: 1 [IU] via SUBCUTANEOUS
  Administered 2024-03-12 (×2): 2 [IU] via SUBCUTANEOUS
  Administered 2024-03-12 – 2024-03-13 (×4): 1 [IU] via SUBCUTANEOUS
  Administered 2024-03-14: 2 [IU] via SUBCUTANEOUS

## 2024-03-11 MED ORDER — LOPERAMIDE HCL 2 MG PO CAPS
2.0000 mg | ORAL_CAPSULE | Freq: Three times a day (TID) | ORAL | Status: DC | PRN
  Administered 2024-03-11 – 2024-03-12 (×2): 2 mg via ORAL
  Filled 2024-03-11 (×4): qty 1

## 2024-03-11 MED ORDER — BACLOFEN 10 MG PO TABS
10.0000 mg | ORAL_TABLET | Freq: Three times a day (TID) | ORAL | Status: DC
  Administered 2024-03-11 – 2024-03-14 (×10): 10 mg via ORAL
  Filled 2024-03-11 (×10): qty 1

## 2024-03-11 MED ORDER — DOCUSATE SODIUM 100 MG PO CAPS
100.0000 mg | ORAL_CAPSULE | Freq: Two times a day (BID) | ORAL | Status: DC
  Administered 2024-03-12: 100 mg via ORAL
  Filled 2024-03-11 (×3): qty 1

## 2024-03-11 MED ORDER — OXYCODONE HCL 5 MG PO TABS: 5.0000 mg | ORAL_TABLET | Freq: Four times a day (QID) | ORAL | Status: DC | PRN

## 2024-03-11 MED ORDER — BACLOFEN 10 MG PO TABS: 10.0000 mg | ORAL_TABLET | Freq: Three times a day (TID) | ORAL | Status: DC | PRN

## 2024-03-11 MED ORDER — PANTOPRAZOLE SODIUM 40 MG PO TBEC
40.0000 mg | DELAYED_RELEASE_TABLET | Freq: Every day | ORAL | Status: DC
  Administered 2024-03-11 – 2024-03-14 (×4): 40 mg via ORAL
  Filled 2024-03-11 (×4): qty 1

## 2024-03-11 MED ORDER — PROPRANOLOL HCL 10 MG PO TABS
40.0000 mg | ORAL_TABLET | Freq: Two times a day (BID) | ORAL | Status: DC
  Administered 2024-03-11 – 2024-03-14 (×7): 40 mg via ORAL
  Filled 2024-03-11 (×7): qty 4

## 2024-03-11 MED ORDER — LAMOTRIGINE 25 MG PO TABS
50.0000 mg | ORAL_TABLET | Freq: Two times a day (BID) | ORAL | Status: DC
  Administered 2024-03-11 – 2024-03-14 (×7): 50 mg via ORAL
  Filled 2024-03-11 (×7): qty 2

## 2024-03-11 NOTE — Progress Notes (Signed)
 LTM EEG hooked up and running - no initial skin breakdown - push button tested - [facility] monitoring.

## 2024-03-11 NOTE — Progress Notes (Signed)
 NAMEAgigail Benitez, MRN:  [id number], DOB:  April 20, 1971, LOS: 1 ADMISSION DATE:  03/10/2024, CONSULTATION DATE:  03/10/2024 REFERRING MD:  ED - [facility], CHIEF COMPLAINT:  Unresponsiveness.    History of Present Illness:  69 yoF with PMH as below significant for MS, chronic pain, and prior seizures found unresponsive in urine and feces at home, no clear hx of seizure activity reported.  Hx of multiple falls over last several weeks, including fx of right kneecap and tibia with ligament injury being followed outpt by ortho.  Bradycardic and hypotensive in ER, received 2L, atropine, levophed, no response to narcan , and intubated with improvement in hemodynamics.  Intubated at [facility].  Was apparently awake prior to leaving [facility].  Transferred to [facility] for LTM.   Pertinent  Medical History   Past Medical History:  Diagnosis Date   Acute postoperative pain 04/30/2019   Diabetes mellitus without complication (HCC)    GERD (gastroesophageal reflux disease)    Headache    daily   MS (multiple sclerosis) (HCC)    Numbness and tingling of both legs below knees    pt reports secondary to MS   Osteoarthritis    lumbar   Vertigo    last episode opver 18 yrs ago   Wears dentures    full upper and lower  IDA  Significant Hospital Events: Including procedures, antibiotic start and stop dates in addition to other pertinent events   5/6 - admitted in transfer from [facility]  Interim History / Subjective:  Extubated yesterday No complaints except AoC numbness in BLE below knees Denies any recent changes in meds  Objective   Blood pressure (!) 140/62, pulse 74, temperature 99.1 F (37.3 C), resp. rate 13, height 5\' 3"  (1.6 m), weight 94.5 kg, SpO2 97%.    Vent Mode: PRVC FiO2 (%):  [40 %-50 %] 40 % Set Rate:  [22 bmp] 22 bmp Vt Set:  [450 mL] 450 mL PEEP:  [5 cmH20-8 cmH20] 5 cmH20 Plateau Pressure:  [16 cmH20-20 cmH20] 16 cmH20   Intake/Output Summary (Last 24 hours) at 03/11/2024 0848 Last data  filed at 03/11/2024 0800 Gross per 24 hour  Intake 1841.98 ml  Output 1700 ml  Net 141.98 ml   Filed Weights   03/10/24 1716 03/11/24 0500  Weight: 96.6 kg 94.5 kg   Examination: General:  Adult female lying in bed in NAD HEENT: MM pink/moist, edentulous, pupils 4/r, anicteric Neuro: Alert, oriented x 2, confused to date otherwise appropriate, no focal deficits CV: rr, NSR, no murmur PULM:  non labored, CTA, on room air GI: soft, bs+, NT, foley- cyu Extremities: warm/dry, no LE edema, older appearing ecchymosis to knees Skin: no   UOP 1.7L Net +19ml Afebrile  Awaiting labs>   Resolved Hospital Problem list   Lactic acidosis  Assessment & Plan:   Acute encephalopathy- unclear etiology, ddx seizure, polypharmacy  UDS positive for barbiturates and TCA Hx seizures Multiple sclerosis followed at [facility] - relapsing/remitting, with normal exam when in remission Chronic pain - on home oxy Falls w/prior 08/2023 R knee fracture of right kneecap and tibia with ligament followed outpt by ortho  Elevated CK P:  - ongoing LTM, seizure precautions - serial neuro exams - appreciate Neurology assistance  - AEDs per neurology, awaiting phenobarbital and lamotrigine levels - will need verify home meds, ?on tecfidera 240mg  BID, per [facility] neurology note 01/2024 - remains hemodynamically stable, restart propanolol 40mg  BID - restart lower dose baclofen  (home 20mg  QID)> baclofen  10mg   TID - restart lyrica  50mg  BID and oxy prn w/ bowel regimen - d/c foley  - PT/ OT - awaiting recheck CK, if improving, and advancing oral intake, d/c MIVF - neuroprotective measures - if remains stable, likely transfer out of ICU later today   Acute respiratory insuffiencey, resolved - s/p extubation for encephalopathy, on room air - cont aggressive pulm hygiene  - can start clears and advance as tolerated  Hypokalemia - await BMET/ Mag, replete prn   Overactive bladder - restart pta myrbetriq  DMT2 -  hold metformin/ januvia for today - SSI prn   IDA - await CBC  HLD - hold statin till CK back  Depression - restart zoloft  GERD - PPI  Best Practice (right click and "Reselect all SmartList Selections" daily)   Diet/type: NPO> start clears DVT prophylaxis prophylactic heparin  Pressure ulcer(s): N/A GI prophylaxis: N/A Lines: N/A Foley:  Yes, and it is no longer needed and removal ordered  Code Status:  full code Last date of multidisciplinary goals of care discussion [pending]   Labs   CBC: Recent Labs  Lab 03/10/24 1044 03/10/24 1904  WBC 5.5 7.5  NEUTROABS 4.2 6.3  HGB 13.5 13.1  HCT 42.2 38.9  MCV 89.8 88.0  PLT 178 171    Basic Metabolic Panel: Recent Labs  Lab 03/10/24 1044 03/10/24 1904  NA 137 138  K 2.9* 2.5*  CL 102 106  CO2 25 21*  GLUCOSE 101* 115*  BUN 13 10  CREATININE 1.32* 1.08*  CALCIUM 9.2 8.5*  MG 1.7 1.3*  PHOS 4.3 3.7   GFR: Estimated Creatinine Clearance: 65.8 mL/min (A) (by C-G formula based on SCr of 1.08 mg/dL (H)). Recent Labs  Lab 03/10/24 1044 03/10/24 1308 03/10/24 1904 03/10/24 2208  WBC 5.5  --  7.5  --   LATICACIDVEN 2.1* 1.7 1.0 0.7    Liver Function Tests: Recent Labs  Lab 03/10/24 1044 03/10/24 1904  AST 31 32  ALT 14 14  ALKPHOS 65 61  BILITOT 0.7 0.8  PROT 7.1 6.3*  ALBUMIN 3.5 3.0*   No results for input(s): "LIPASE", "AMYLASE" in the last 168 hours. No results for input(s): "AMMONIA" in the last 168 hours.  ABG    Component Value Date/Time   PHART 7.41 03/10/2024 1204   PCO2ART 34 03/10/2024 1204   PO2ART 96 03/10/2024 1204   HCO3 21.6 03/10/2024 1204   ACIDBASEDEF 2.4 (H) 03/10/2024 1204   O2SAT 100 03/10/2024 1204     Coagulation Profile: Recent Labs  Lab 03/10/24 1308 03/10/24 1904  INR 1.0 1.1    Cardiac Enzymes: Recent Labs  Lab 03/10/24 1134  CKTOTAL 1,000*    HbA1C: No results found for: "HGBA1C"  CBG: Recent Labs  Lab 03/10/24 1725 03/10/24 2127  03/10/24 2346 03/11/24 0345 03/11/24 0755  GLUCAP 119* 140* 144* 126* 118*   Allergies Allergies  Allergen Reactions   5-Alpha Reductase Inhibitors    Desvenlafaxine     Allergic reaction   Glatiramer Other (See Comments)    Pt states me gave her cold spells.    Rebif [Interferon Beta-1a] Other (See Comments)    The skin around the injection site turned black   Gramineae Pollens Itching   Latex Itching    Only when gloves are worn by pt   Tramadol  Diarrhea, Nausea And Vomiting and Anxiety     Home Medications  Prior to Admission medications   Medication Sig Start Date End Date Taking? Authorizing Provider  aspirin-acetaminophen -caffeine (EXCEDRIN MIGRAINE) 250-250-65 MG tablet Take by mouth every 6 (six) hours as needed for headache.    [provider]  baclofen  (LIORESAL ) 20 MG tablet Take 1 tablet (20 mg total) by mouth 4 (four) times daily. 08/16/20 08/21/23  Renaldo Caroli, MD  Cholecalciferol 1.25 MG (50000 UT) capsule Take 50,000 Units by mouth once a week. 03/19/23   [provider]  Dimethyl Fumarate 240 MG CPDR Take by mouth. 08/06/22   [provider]  lamoTRIgine (LAMICTAL) 25 MG tablet Take 50 mg by mouth 2 (two) times daily. 11/23/16   [provider]  levETIRAcetam (KEPPRA) 250 MG tablet Take by mouth. 05/10/22 08/21/23  [provider]  loperamide (IMODIUM A-D) 2 MG tablet Take 2 mg by mouth as needed for diarrhea or loose stools.    [provider]  meloxicam  (MOBIC ) 15 MG tablet Take 1 tablet by mouth daily. 05/10/22 08/21/23  [provider]  metFORMIN (GLUCOPHAGE-XR) 500 MG 24 hr tablet Take 250 mg by mouth daily with breakfast.  04/03/19 08/21/23  [provider]  mirabegron ER (MYRBETRIQ) 50 MG TB24 tablet Take 50 mg by mouth daily.  02/13/19   [provider]  naloxone  (NARCAN ) nasal spray 4 mg/0.1 mL Place 1 spray into the nose as needed for up to 365 doses (for opioid-induced  respiratory depresssion). In case of emergency (overdose), spray once into each nostril. If no response within 3 minutes, repeat application and call 911. 02/17/24 02/10/25  Renaldo Caroli, MD  omeprazole (PRILOSEC) 40 MG capsule Take 40 mg by mouth daily. 01/18/24   [provider]  oxyCODONE  (OXY IR/ROXICODONE ) 5 MG immediate release tablet Take 1 tablet (5 mg total) by mouth every 6 (six) hours as needed for severe pain (pain score 7-10). Must last 30 days. 02/17/24 03/18/24  Renaldo Caroli, MD  oxyCODONE  (OXY IR/ROXICODONE ) 5 MG immediate release tablet Take 1 tablet (5 mg total) by mouth every 6 (six) hours as needed for severe pain (pain score 7-10). Must last 30 days. 03/18/24 04/17/24  Renaldo Caroli, MD  oxyCODONE  (OXY IR/ROXICODONE ) 5 MG immediate release tablet Take 1 tablet (5 mg total) by mouth every 6 (six) hours as needed for severe pain (pain score 7-10). Must last 30 days. 04/17/24 05/17/24  Renaldo Caroli, MD  pravastatin (PRAVACHOL) 20 MG tablet Take 1 tablet by mouth daily. 07/03/23 07/02/24  [provider]  pregabalin  (LYRICA ) 50 MG capsule Take 50 mg by mouth 2 (two) times daily. 07/07/22 08/21/23  [provider]  primidone (MYSOLINE) 50 MG tablet Take 50 mg by mouth 2 (two) times daily. 2 tablets twice per day 05/10/22 08/21/23  [provider]  propranolol (INDERAL) 40 MG tablet Take by mouth. 05/10/22 08/21/23  [provider]  sertraline (ZOLOFT) 100 MG tablet Take 100 mg by mouth daily.    [provider]  sitaGLIPtin (JANUVIA) 100 MG tablet Take 1 tablet by mouth daily. 05/10/22 08/21/23  [provider]     Critical care time: 60       Early Glisson, MSN, AG-ACNP-BC [facility] 03/11/2024, 8:48 AM  See Amion for pager If no response to pager , please call [phone] until 7pm After 7:00 pm call Elink  [phone]

## 2024-03-11 NOTE — Plan of Care (Signed)
  Problem: Education: Goal: Knowledge of General Education information will improve Description: Including pain rating scale, medication(s)/side effects and non-pharmacologic comfort measures Outcome: Progressing   Problem: Clinical Measurements: Goal: Ability to maintain clinical measurements within normal limits will improve Outcome: Progressing Goal: Diagnostic test results will improve Outcome: Progressing Goal: Respiratory complications will improve Outcome: Progressing Goal: Cardiovascular complication will be avoided Outcome: Progressing   Problem: Activity: Goal: Risk for activity intolerance will decrease Outcome: Progressing   Problem: Coping: Goal: Level of anxiety will decrease Outcome: Progressing   Problem: Pain Managment: Goal: General experience of comfort will improve and/or be controlled Outcome: Progressing   Problem: Skin Integrity: Goal: Risk for impaired skin integrity will decrease Outcome: Progressing   Problem: Coping: Goal: Ability to adjust to condition or change in health will improve Outcome: Progressing   Problem: Fluid Volume: Goal: Ability to maintain a balanced intake and output will improve Outcome: Progressing   Problem: Skin Integrity: Goal: Risk for impaired skin integrity will decrease Outcome: Progressing   Problem: Tissue Perfusion: Goal: Adequacy of tissue perfusion will improve Outcome: Progressing

## 2024-03-11 NOTE — Progress Notes (Signed)
 LTM maint complete - no skin breakdown seen  Patient moved from 4N to 3w.  All leads attached, study on server. [facility] monitored, Event button test confirmed by [facility].

## 2024-03-11 NOTE — Progress Notes (Signed)
 Inpatient Rehab Admissions Coordinator:   Per PT recommendations pt was screened for CIR by Loye Rumble, PT, DPT.  Admitted yesterday with unresponsiveness.  Hx of MS and seizures on multiple medications.  Unclear cause of encephalopathy and workup ongoing.  I will rescreen tomorrow.    Loye Rumble, PT, DPT Admissions Coordinator [phone] 03/11/24  3:20 PM

## 2024-03-11 NOTE — Progress Notes (Signed)
 Pt arrived from 4NP via bed with RN.  Pt settled in, oriented to room, call bell within reach and instructed on its use.  VS obtained, tele monitor put on and called into [facility].  Skin check done with 2nd RN, Alisha upon arrival.  Bed alarm on.  EEG monitor arrived with patient

## 2024-03-11 NOTE — Progress Notes (Signed)
 Echocardiogram 2D Echocardiogram has been performed.  Emmaline Haring Rianne Degraaf RDCS 03/11/2024, 10:09 AM

## 2024-03-11 NOTE — Progress Notes (Signed)
   03/11/24 1600  Spiritual Encounters  Type of Visit Initial  Care provided to: Patient  Reason for visit Routine spiritual support  OnCall Visit No   Chaplain responded to consult request for support. She stated that she had a seizure and was intubated. She is so thankful that after nine days, she was finally able to eat some warm soup. She shared some of the family dynamics and relationship with his sons. Chaplain listened attentively and provided emotional and spiritual support.    M.Kubra Welby Hale Resident [phone]

## 2024-03-11 NOTE — Evaluation (Signed)
 Physical Therapy Evaluation Patient Details Name: Shannon Benitez MRN: [id number] DOB: 09/06/1971 Today's Date: 03/11/2024  History of Present Illness  Pt is a 53 y.o. female who presented 03/10/24 after being found unresponsive. Intubated & extubated 5/6.Admitted with seizure vs acute encephalopathy with potential polypharmacy contribution. PMH: MS, seizures, DM, GERD, vertigo, OA   Clinical Impression  Pt presents with condition above and deficits mentioned below, see PT Problem List. PTA, she was mod I using a RW for functional mobility, living with her son in a house with 3 STE. She stays on the main level and does not need to access the basement. Her son works night shift and it is unclear if there may be more support available to her when her son is at work. Currently, the pt demonstrates deficits in gross coordination, balance, gross strength, activity tolerance, cognition, and communication (stating the wrong words/names at times). She is at high risk for falls, needing modA for transfers and taking a few pivotal steps with HHA today. She could potentially progress quickly and has had a drastic functional decline and is highly motivated to participate and improve with a goal to go home soon. Thus, she may benefit from intensive inpatient rehab, > 3 hours/day. Will continue to follow acutely.      If plan is discharge home, recommend the following: A lot of help with walking and/or transfers;A lot of help with bathing/dressing/bathroom;Assistance with cooking/housework;Direct supervision/assist for medications management;Direct supervision/assist for financial management;Assist for transportation;Help with stairs or ramp for entrance;Supervision due to cognitive status   Can travel by private vehicle        Equipment Recommendations Wheelchair (measurements PT);Wheelchair cushion (measurements PT) (pending progress)  Recommendations for Other Services  Rehab consult;Speech consult     Functional Status Assessment Patient has had a recent decline in their functional status and demonstrates the ability to make significant improvements in function in a reasonable and predictable amount of time.     Precautions / Restrictions Precautions Precautions: Fall;Other (comment) Recall of Precautions/Restrictions: Impaired Precaution/Restrictions Comments: EEG Restrictions Weight Bearing Restrictions Per Provider Order: No      Mobility  Bed Mobility Overal bed mobility: Needs Assistance Bed Mobility: Supine to Sit     Supine to sit: Contact guard, HOB elevated, Used rails     General bed mobility comments: Extra time and cues needed to scoot to L EOB, CGA for safety    Transfers Overall transfer level: Needs assistance Equipment used: 1 person hand held assist Transfers: Sit to/from Stand, Bed to chair/wheelchair/BSC Sit to Stand: Mod assist   Step pivot transfers: Mod assist       General transfer comment: Pt needed cues to hold onto therapist anterior to her for support/balance. ModA needed to power up to stand and provide stability with step pivot transfer to R from bed to recliner, instability noted in hips and knees throughout.    Ambulation/Gait Ambulation/Gait assistance: Mod assist Gait Distance (Feet): 1 Feet Assistive device: 1 person hand held assist Gait Pattern/deviations: Step-to pattern, Decreased step length - right, Decreased step length - left, Decreased stride length, Trunk flexed, Shuffle Gait velocity: reduced Gait velocity interpretation: <1.31 ft/sec, indicative of household ambulator   General Gait Details: Pt takes slow, small, unsteady, shuffling steps to step pivot to R from bed to recliner with HHA. Noted bil hip and knee instability throughout.  Stairs            Wheelchair Mobility     Tilt Bed  Modified Rankin (Stroke Patients Only) Modified Rankin (Stroke Patients Only) Pre-Morbid Rankin Score: No  symptoms Modified Rankin: Moderately severe disability     Balance Overall balance assessment: Needs assistance Sitting-balance support: No upper extremity supported, Feet supported Sitting balance-Leahy Scale: Fair Sitting balance - Comments: able to reach off COG to donn L sock in figure 4 position with CGA majority of time but intermittent posterior LOB needing minA to recover.   Standing balance support: Bilateral upper extremity supported, During functional activity Standing balance-Leahy Scale: Poor Standing balance comment: Reliant on UE support and external physical assistance                             Pertinent Vitals/Pain Pain Assessment Pain Assessment: Faces Faces Pain Scale: No hurt Pain Intervention(s): Monitored during session    Home Living Family/patient expects to be discharged to:: Private residence Living Arrangements: Children (adult son, who works nights) Available Help at Discharge: Family Type of Home: House Home Access: Stairs to enter Entrance Stairs-Rails: Doctor, general practice of Steps: 3   Home Layout: One level;Laundry or work area in basement (does not need to access basement) Home Equipment: Agricultural consultant (2 wheels);BSC/3in1;Shower seat      Prior Function Prior Level of Function : Independent/Modified Independent;Driving             Mobility Comments: Uses RW ADLs Comments: does not work     Radio producer Extremity Assessment Upper Extremity Assessment: Defer to OT evaluation    Lower Extremity Assessment Lower Extremity Assessment: Generalized weakness;RLE deficits/detail;LLE deficits/detail RLE Deficits / Details: Gross MMT scores of >/= 4+ bil; reported numbness/tingling bil below knees, which she reports is new RLE Sensation: decreased light touch LLE Deficits / Details: Gross MMT scores of >/= 4+ bil; reported numbness/tingling bil below knees, which she reports is new LLE  Sensation: decreased light touch    Cervical / Trunk Assessment Cervical / Trunk Assessment: Normal  Communication   Communication Communication: Impaired Factors Affecting Communication: Reduced clarity of speech (mistaken words at times)    Cognition Arousal: Alert Behavior During Therapy: Impulsive (mildly)   PT - Cognitive impairments: Awareness, Attention, Initiation, Sequencing, Problem solving, Safety/Judgement, Memory                       PT - Cognition Comments: Pt often repeating the same or similar questions. Needs multi-modal cues to sequence mobility with noted poor initiation and slow problem solving. Following commands: Impaired Following commands impaired: Only follows one step commands consistently, Follows one step commands with increased time     Cueing Cueing Techniques: Verbal cues, Tactile cues     General Comments General comments (skin integrity, edema, etc.): VSS on RA    Exercises     Assessment/Plan    PT Assessment Patient needs continued PT services  PT Problem List Decreased strength;Decreased balance;Decreased activity tolerance;Decreased mobility;Decreased coordination;Decreased cognition;Impaired sensation       PT Treatment Interventions DME instruction;Stair training;Gait training;Functional mobility training;Therapeutic activities;Therapeutic exercise;Balance training;Neuromuscular re-education;Patient/family education;Cognitive remediation    PT Goals (Current goals can be found in the Care Plan section)  Acute Rehab PT Goals Patient Stated Goal: to get better & go home PT Goal Formulation: With patient Time For Goal Achievement: 03/25/24 Potential to Achieve Goals: Good    Frequency Min 3X/week     Co-evaluation  AM-PAC PT "6 Clicks" Mobility  Outcome Measure Help needed turning from your back to your side while in a flat bed without using bedrails?: A Little Help needed moving from lying on your  back to sitting on the side of a flat bed without using bedrails?: A Little Help needed moving to and from a bed to a chair (including a wheelchair)?: A Lot Help needed standing up from a chair using your arms (e.g., wheelchair or bedside chair)?: A Lot Help needed to walk in hospital room?: Total Help needed climbing 3-5 steps with a railing? : Total 6 Click Score: 12    End of Session Equipment Utilized During Treatment: Gait belt Activity Tolerance: Patient tolerated treatment well Patient left: in chair;with call bell/phone within reach;with chair alarm set;with nursing/sitter in room Nurse Communication: Mobility status PT Visit Diagnosis: Unsteadiness on feet (R26.81);Other abnormalities of gait and mobility (R26.89);Muscle weakness (generalized) (M62.81);Difficulty in walking, not elsewhere classified (R26.2);Other symptoms and signs involving the nervous system (R29.898)    Time: 5621-3086 PT Time Calculation (min) (ACUTE ONLY): 23 min   Charges:   PT Evaluation $PT Eval Moderate Complexity: 1 Mod PT Treatments $Therapeutic Activity: 8-22 mins PT General Charges $$ ACUTE PT VISIT: 1 Visit         Vernida Goodie, PT, DPT Acute Rehabilitation Services  Office: [phone]   Ellyn Hack 03/11/2024, 2:03 PM

## 2024-03-11 NOTE — Procedures (Incomplete)
 Patient Name: Shannon Benitez  MRN: [id number]  Epilepsy Attending: Arleene Lack  Referring Physician/Provider: Augustin Leber, MD  Duration: 03/11/2024 0447 to 03/12/2024 0447  Patient history: 53 y.o. female with hx of MS and prior seizure who was found down and hypotensive in her house today. EEG to evaluate for seizure.  Level of alertness: Awake  AEDs during EEG study: None  Technical aspects: This EEG study was done with scalp electrodes positioned according to the 10-20 International system of electrode placement. Electrical activity was reviewed with band pass filter of 1-70Hz , sensitivity of 7 uV/mm, display speed of 33mm/sec with a 60Hz  notched filter applied as appropriate. EEG data were recorded continuously and digitally stored.  Video monitoring was available and reviewed as appropriate.  Description: The posterior dominant rhythm consists of 8 Hz activity of moderate voltage (25-35 uV) seen predominantly in posterior head regions, symmetric and reactive to eye opening and eye closing. Hyperventilation and photic stimulation were not performed.     Of note, EEG was technically difficult due to near constant chewing artifact.  IMPRESSION: This study is within normal limits. No seizures or epileptiform discharges were seen throughout the recording.  A normal interictal EEG does not exclude the diagnosis of epilepsy.   Makai Dumond O Jahmez Bily

## 2024-03-12 ENCOUNTER — Encounter (HOSPITAL_COMMUNITY): Payer: MEDICAID

## 2024-03-12 DIAGNOSIS — R4189 Other symptoms and signs involving cognitive functions and awareness: Secondary | ICD-10-CM | POA: Diagnosis not present

## 2024-03-12 DIAGNOSIS — G9341 Metabolic encephalopathy: Secondary | ICD-10-CM

## 2024-03-12 DIAGNOSIS — R569 Unspecified convulsions: Secondary | ICD-10-CM | POA: Diagnosis not present

## 2024-03-12 LAB — GLUCOSE, CAPILLARY
Glucose-Capillary: 141 mg/dL — ABNORMAL HIGH (ref 70–99)
Glucose-Capillary: 163 mg/dL — ABNORMAL HIGH (ref 70–99)
Glucose-Capillary: 157 mg/dL — ABNORMAL HIGH (ref 70–99)
Glucose-Capillary: 155 mg/dL — ABNORMAL HIGH (ref 70–99)

## 2024-03-12 LAB — LAMOTRIGINE LEVEL: Lamotrigine Lvl: 3.1 ug/mL (ref 2.0–20.0)

## 2024-03-12 MED ORDER — MIRABEGRON ER 25 MG PO TB24
50.0000 mg | ORAL_TABLET | Freq: Every evening | ORAL | Status: DC
  Administered 2024-03-12 – 2024-03-13 (×2): 50 mg via ORAL
  Filled 2024-03-12 (×2): qty 2

## 2024-03-12 MED ORDER — NAPHAZOLINE-GLYCERIN 0.012-0.25 % OP SOLN
1.0000 [drp] | Freq: Four times a day (QID) | OPHTHALMIC | Status: DC | PRN
  Administered 2024-03-12: 2 [drp] via OPHTHALMIC
  Filled 2024-03-12: qty 15

## 2024-03-12 NOTE — Evaluation (Signed)
 Speech Language Pathology Evaluation Patient Details Name: Shannon Benitez MRN: [id number] DOB: [birth year] Today's Date: 03/12/2024 Time: 4098-1191 SLP Time Calculation (min) (ACUTE ONLY): 23 min  Problem List:  Patient Active Problem List   Diagnosis Date Noted   Unresponsive 03/10/2024   Lumbar facet joint pain 07/10/2023   Demyelinating disease of central nervous system, unspecified (HCC) 04/10/2023   Intractable low back pain 12/05/2022   Myofascial pain syndrome of lumbar spine 12/05/2022   Latex precautions, history of latex allergy 10/25/2022   Failure to attend appointment 10/04/2022   Fibromyalgia 03/08/2022   Physical tolerance to opiate drug 07/04/2021   Chronic use of opiate for therapeutic purpose 04/05/2021   Hyponatremia 04/04/2021   Acute exacerbation of chronic low back pain 12/28/2020   Chronic knee pain (Bilateral) 12/28/2020   Osteoarthritis of knee (Right) 12/28/2020   Osteochondroma of proximal fibula (Right) 12/28/2020   Osteochondroma of proximal tibia (Right) 12/28/2020   Osteoarthritis involving multiple joints 12/28/2020   Abnormal MRI, thoracic spine (12/27/2020) 12/28/2020   Chronic hip pain (Bilateral) 12/28/2020   Osteoarthritis of hips (Bilateral) 12/28/2020   Uncomplicated opioid dependence (HCC) 11/08/2020   Other spondylosis, sacral and sacrococcygeal region 08/18/2020   Macromastia 08/10/2020   Scapulalgia (Left) 04/20/2020   Traumatic injury of scapula (Left) 04/20/2020   Chronic diarrhea 11/17/2019   IDA (iron  deficiency anemia) 11/17/2019   Overactive bladder 11/17/2019   Vitamin B 12 deficiency 11/17/2019   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) 08/17/2019   Abnormal MRI, cervical spine (12/27/20) 08/17/2019   Diabetic peripheral neuropathy (HCC) 02/23/2019   Obesity, Class III, BMI 40-49.9 (morbid obesity) 01/19/2019   Spondylosis without myelopathy or radiculopathy, lumbosacral region 01/01/2019   Lumbar facet  syndrome (Bilateral) (R>L) 01/01/2019   Abnormal MRI, lumbar spine (04/18/2023) 01/01/2019   Lumbar facet hypertrophy (Multilevel) (Bilateral) 01/01/2019   Osteoarthritis of facet joint of lumbar spine 01/01/2019   Osteoarthritis of lumbar spine 01/01/2019   DDD (degenerative disc disease), lumbosacral 01/01/2019   Lumbar spondylosis 01/01/2019   Chronic low back pain (Bilateral) (L>R) w/o sciatica 12/17/2018   Muscle spasticity 12/17/2018   Chronic pain syndrome 11/27/2018   Chronic low back pain (1ry area of Pain) (Bilateral) (L>R) w/ sciatica (Bilateral) 11/27/2018   Chronic lower extremity pain (2ry area of Pain) (Bilateral) (R>L) 11/27/2018   Long term prescription benzodiazepine use 11/27/2018   Pharmacologic therapy 11/27/2018   Disorder of skeletal system 11/27/2018   Problems influencing health status 11/27/2018   Anemia 05/19/2018   Type 2 diabetes mellitus with obesity (HCC) 02/19/2018   Anxiety 01/14/2018   Depression 01/14/2018   Neurogenic pain 01/14/2018   Vitamin D  deficiency 01/14/2018   Medication management 06/10/2017   Other long term (current) drug therapy 06/10/2017   Chronic sacroiliac joint pain (Bilateral) (L>R) 01/30/2017   Generalized seizure disorder (HCC) 11/23/2016   Multiple sclerosis (HCC) 11/23/2016   Tremor 11/23/2016   Past Medical History:  Past Medical History:  Diagnosis Date   Acute postoperative pain 04/30/2019   Diabetes mellitus without complication (HCC)    GERD (gastroesophageal reflux disease)    Headache    daily   MS (multiple sclerosis) (HCC)    Numbness and tingling of both legs below knees    pt reports secondary to MS   Osteoarthritis    lumbar   Vertigo    last episode opver 18 yrs ago   Wears dentures    full upper and lower   Past Surgical History:  Past Surgical History:  Procedure Laterality Date   ABDOMINAL HYSTERECTOMY     CATARACT EXTRACTION W/PHACO Right 03/18/2019   Procedure: CATARACT EXTRACTION PHACO AND  INTRAOCULAR LENS PLACEMENT (IOC) RIGHT;  Surgeon: Rosa College, MD;  Location: [facility];  Service: Ophthalmology;  Laterality: Right;  Diabetic - oral meds Latex sensitivity   CATARACT EXTRACTION W/PHACO Left 04/13/2019   Procedure: CATARACT EXTRACTION PHACO AND INTRAOCULAR LENS PLACEMENT (IOC)  LEFT;  Surgeon: Rosa College, MD;  Location: [facility];  Service: Ophthalmology;  Laterality: Left;  Diabetic - oral meds   CESAREAN SECTION     TUMOR REMOVAL Left    pt was 53 years old   HPI:  Pt is a 53 y.o. female who presented 03/10/24 after being found unresponsive. Intubated & extubated 5/6. Admitted with seizure vs acute encephalopathy with potential polypharmacy contribution. MRI negative for acute infarct. PMH: MS, seizures, DM, GERD, vertigo, OA   Assessment / Plan / Recommendation Clinical Impression  Pt presents with improving but not likely baseline mental status and cognition, showing more clarity this afternoon than during attempt to see her earlier today.  She is fully oriented.  Speech is fluent and clear.  Basic receptive and expressive language are WNL, with pt able to provide history, sequence events during a typical day, and provide rational responses to hypothetical problems.  She presents with intermittent delays in response time; no impulsive behaviors noted. She is using call bell appropriately. She demonstrated  subtle/mild deficits in short-term recall and higher-level attention.  She verbalizes appropriate concern about med management and agrees she might benefit from short-term rehab before going home. SLP will follow pending D/C - doubt she will need f/u SLP beyond CIR-level intervention.    SLP Assessment  SLP Recommendation/Assessment: Patient needs continued Speech Lanaguage Pathology Services SLP Visit Diagnosis: Cognitive communication deficit (R41.841)    Recommendations for follow up therapy are one component of a multi-disciplinary  discharge planning process, led by the attending physician.  Recommendations may be updated based on patient status, additional functional criteria and insurance authorization.    Follow Up Recommendations  Acute inpatient rehab (3hours/day)    Assistance Recommended at Discharge  Intermittent Supervision/Assistance  Functional Status Assessment Patient has had a recent decline in their functional status and demonstrates the ability to make significant improvements in function in a reasonable and predictable amount of time.  Frequency and Duration min 2x/week  1 week      SLP Evaluation Cognition  Overall Cognitive Status: Impaired/Different from baseline Arousal/Alertness: Awake/alert Orientation Level: Oriented X4 Attention: Selective Selective Attention: Impaired Selective Attention Impairment: Verbal basic;Functional basic Memory: Impaired Memory Impairment: Retrieval deficit Awareness: Impaired Awareness Impairment: Emergent impairment       Comprehension  Auditory Comprehension Overall Auditory Comprehension: Appears within functional limits for tasks assessed Commands: Within Functional Limits Visual Recognition/Discrimination Discrimination: Within Function Limits Reading Comprehension Reading Status: Within funtional limits    Expression Expression Primary Mode of Expression: Verbal Verbal Expression Overall Verbal Expression: Appears within functional limits for tasks assessed Written Expression Written Expression: Not tested   Oral / Motor  Oral Motor/Sensory Function Overall Oral Motor/Sensory Function: Within functional limits Motor Speech Overall Motor Speech: Appears within functional limits for tasks assessed            Shannon Benitez 03/12/2024, 1:35 PM Shannon Asa L. Beatris Lincoln, MA CCC/SLP Clinical Specialist - Acute Care SLP Acute Rehabilitation Services Office number [phone]

## 2024-03-12 NOTE — Progress Notes (Signed)
 Inpatient Rehab Admissions Coordinator:   Pt mobilizing and completing ADLs without physical assist.  She would not require intensive rehabilitation in the hospital setting at this time.  No consult recommended.   Loye Rumble, PT, DPT Admissions Coordinator [phone] 03/12/24  4:12 PM

## 2024-03-12 NOTE — TOC Initial Note (Signed)
 Transition of Care [facility]) - Initial/Assessment Note    Patient Details  Name: Shannon Benitez MRN: [id number] Date of Birth: Jul 10, 1971  Transition of Care [facility]) CM/SW Contact:    Jonathan Neighbor, RN Phone Number: 03/12/2024, 10:42 AM  Clinical Narrative:                  Pt is from home with her son. Her best friend lives across the street and can check on her as needed. Son works night shift. Pt manages her own medications. History of opiate abuse.  Son provides needed transportation. Current recommendations are for CIR.  TOC following.  Expected Discharge Plan: IP Rehab Facility Barriers to Discharge: Continued Medical Work up   Patient Goals and CMS Choice            Expected Discharge Plan and Services   Discharge Planning Services: CM Consult Post Acute Care Choice: IP Rehab Living arrangements for the past 2 months: Single Family Home                                      Prior Living Arrangements/Services Living arrangements for the past 2 months: Single Family Home Lives with:: Adult Children Patient language and need for interpreter reviewed:: Yes Do you feel safe going back to the place where you live?: Yes          Current home services: DME (rollator/ shower seat) Criminal Activity/Legal Involvement Pertinent to Current Situation/Hospitalization: No - Comment as needed  Activities of Daily Living   ADL Screening (condition at time of admission) Independently performs ADLs?: Yes (appropriate for developmental age) Is the patient deaf or have difficulty hearing?: No Does the patient have difficulty seeing, even when wearing glasses/contacts?: No Does the patient have difficulty concentrating, remembering, or making decisions?: No  Permission Sought/Granted                  Emotional Assessment Appearance:: Appears stated age Attitude/Demeanor/Rapport:  (slow responses) Affect (typically observed): Apprehensive Orientation: :  Oriented to Self, Oriented to Place, Oriented to Situation   Psych Involvement: No (comment)  Admission diagnosis:  Unresponsive [R41.89] Patient Active Problem List   Diagnosis Date Noted   Unresponsive 03/10/2024   Lumbar facet joint pain 07/10/2023   Demyelinating disease of central nervous system, unspecified (HCC) 04/10/2023   Intractable low back pain 12/05/2022   Myofascial pain syndrome of lumbar spine 12/05/2022   Latex precautions, history of latex allergy 10/25/2022   Failure to attend appointment 10/04/2022   Fibromyalgia 03/08/2022   Physical tolerance to opiate drug 07/04/2021   Chronic use of opiate for therapeutic purpose 04/05/2021   Hyponatremia 04/04/2021   Acute exacerbation of chronic low back pain 12/28/2020   Chronic knee pain (Bilateral) 12/28/2020   Osteoarthritis of knee (Right) 12/28/2020   Osteochondroma of proximal fibula (Right) 12/28/2020   Osteochondroma of proximal tibia (Right) 12/28/2020   Osteoarthritis involving multiple joints 12/28/2020   Abnormal MRI, thoracic spine (12/27/2020) 12/28/2020   Chronic hip pain (Bilateral) 12/28/2020   Osteoarthritis of hips (Bilateral) 12/28/2020   Uncomplicated opioid dependence (HCC) 11/08/2020   Other spondylosis, sacral and sacrococcygeal region 08/18/2020   Macromastia 08/10/2020   Scapulalgia (Left) 04/20/2020   Traumatic injury of scapula (Left) 04/20/2020   Chronic diarrhea 11/17/2019   IDA (iron  deficiency anemia) 11/17/2019   Overactive bladder 11/17/2019   Vitamin B 12 deficiency 11/17/2019   Thoracic  T7-8 subarticular disc protrusion (IVDD) (Right) 08/17/2019   Abnormal MRI, cervical spine (12/27/20) 08/17/2019   Diabetic peripheral neuropathy (HCC) 02/23/2019   Obesity, Class III, BMI 40-49.9 (morbid obesity) 01/19/2019   Spondylosis without myelopathy or radiculopathy, lumbosacral region 01/01/2019   Lumbar facet syndrome (Bilateral) (R>L) 01/01/2019   Abnormal MRI, lumbar spine (04/18/2023)  01/01/2019   Lumbar facet hypertrophy (Multilevel) (Bilateral) 01/01/2019   Osteoarthritis of facet joint of lumbar spine 01/01/2019   Osteoarthritis of lumbar spine 01/01/2019   DDD (degenerative disc disease), lumbosacral 01/01/2019   Lumbar spondylosis 01/01/2019   Chronic low back pain (Bilateral) (L>R) w/o sciatica 12/17/2018   Muscle spasticity 12/17/2018   Chronic pain syndrome 11/27/2018   Chronic low back pain (1ry area of Pain) (Bilateral) (L>R) w/ sciatica (Bilateral) 11/27/2018   Chronic lower extremity pain (2ry area of Pain) (Bilateral) (R>L) 11/27/2018   Long term prescription benzodiazepine use 11/27/2018   Pharmacologic therapy 11/27/2018   Disorder of skeletal system 11/27/2018   Problems influencing health status 11/27/2018   Anemia 05/19/2018   Type 2 diabetes mellitus with obesity (HCC) 02/19/2018   Anxiety 01/14/2018   Depression 01/14/2018   Neurogenic pain 01/14/2018   Vitamin D  deficiency 01/14/2018   Medication management 06/10/2017   Other long term (current) drug therapy 06/10/2017   Chronic sacroiliac joint pain (Bilateral) (L>R) 01/30/2017   Generalized seizure disorder (HCC) 11/23/2016   Multiple sclerosis (HCC) 11/23/2016   Tremor 11/23/2016   PCP:  Edgar Goods, MD Pharmacy:   [facility] - [place], [place] - [address] [place] [address] Phone: [phone] Fax: [phone]  [facility] [id number] [place], [place] - [address] [place] [address] Phone: [phone] Fax: [phone]     Social Drivers of Health (SDOH) Social History: SDOH Screenings   Food Insecurity: Food Insecurity Present (03/11/2024)  Housing: Low Risk  (03/11/2024)  Transportation Needs: No Transportation Needs (03/11/2024)  Utilities: At Risk (03/11/2024)  Depression (PHQ2-9): Low Risk  (02/12/2024)  Tobacco Use: Medium Risk (03/11/2024)  Health Literacy: Low Risk  (02/13/2021)   Received from [facility], [facility]   SDOH  Interventions:     Readmission Risk Interventions     No data to display

## 2024-03-12 NOTE — Progress Notes (Signed)
 NEUROLOGY CONSULT FOLLOW UP NOTE   Date of service: Mar 12, 2024 Patient Name: Shannon Benitez MRN:  [id number] DOB:  [birth year]  Interval Hx/subjective   No family at the bedside. Patient just finished working with PT. Patient states she feels back to baseline  LTM within normal limits and no seizures identified. Will d/c LTM today   Vitals   Vitals:   03/11/24 2338 03/12/24 0401 03/12/24 0734 03/12/24 1125  BP: (!) 150/74 136/62 (!) 158/74 (!) 151/76  Pulse: 82 81 79 80  Resp: 18 18 18 20   Temp: 98.8 F (37.1 C) 99.8 F (37.7 C) 98.9 F (37.2 C) 98.9 F (37.2 C)  TempSrc: Oral Oral Oral Oral  SpO2: 95% 92% 94% 96%  Weight:      Height:         Body mass index is 36.9 kg/m.  Physical Exam   Constitutional: Appears well-developed and well-nourished.  Psych: Affect appropriate to situation.  Eyes: No scleral injection.  HENT: No OP obstrucion.  Head: Normocephalic.  Cardiovascular: Normal rate and regular rhythm.  Respiratory: Effort normal, non-labored breathing.  GI: Soft.  No distension. There is no tenderness.  Skin: WDI.   Neurologic Examination   Mental Status -  Level of arousal and orientation to time, place, and person were intact. Language including expression, naming, repetition, comprehension was assessed and found intact. Attention span and concentration were normal.   Cranial Nerves II - XII - II - Visual field intact OU. III, IV, VI - Extraocular movements intact. V - Facial sensation intact bilaterally. VII - Facial movement intact bilaterally. VIII - Hearing & vestibular intact bilaterally. X - Palate elevates symmetrically. XI - Chin turning & shoulder shrug intact bilaterally. XII - Tongue protrusion intact.  Motor Strength - generalized weakness in all extremities and pronator drift was absent.  Bulk was normal and fasciculations were absent.   Motor Tone - Muscle tone was assessed at the neck and appendages and was normal.  Sensory  - Light touch, temperature/pinprick were assessed and were symmetrical.   Coordination - The patient had normal movements in the hands and feet with no ataxia or dysmetria.  Tremor was absent.  Gait and Station - deferred.  Medications  Current Facility-Administered Medications:    baclofen  (LIORESAL ) tablet 10 mg, 10 mg, Oral, TID, Hardie Leyland B, NP, 10 mg at 03/12/24 4098   Chlorhexidine Gluconate Cloth 2 % PADS 6 each, 6 each, Topical, Daily, Chand, Sudham, MD, 6 each at 03/11/24 0906   docusate sodium (COLACE) capsule 100 mg, 100 mg, Oral, BID, Mitchell, Madeline I, RPH   heparin injection 5,000 Units, 5,000 Units, Subcutaneous, Q8H, Shelli Dexter M, PA-C, 5,000 Units at 03/12/24 1191   insulin aspart (novoLOG) injection 0-9 Units, 0-9 Units, Subcutaneous, TID WC, Hardie Leyland B, NP, 2 Units at 03/12/24 1138   lamoTRIgine (LAMICTAL) tablet 50 mg, 50 mg, Oral, BID, Shamira Toutant, MD, 50 mg at 03/12/24 0958   levETIRAcetam (KEPPRA) tablet 500 mg, 500 mg, Oral, BID, Daiwik Buffalo, MD, 500 mg at 03/12/24 4782   loperamide (IMODIUM) capsule 2 mg, 2 mg, Oral, Q8H PRN, Hardie Leyland B, NP, 2 mg at 03/12/24 1139   mirabegron ER (MYRBETRIQ) tablet 50 mg, 50 mg, Oral, QPM, Ghimire, Kuber, MD   Oral care mouth rinse, 15 mL, Mouth Rinse, PRN, Chand, Sudham, MD   oxyCODONE  (Oxy IR/ROXICODONE ) immediate release tablet 5-10 mg, 5-10 mg, Oral, Q6H PRN, Simpson, Paula B, NP   pantoprazole (PROTONIX) EC tablet  40 mg, 40 mg, Oral, Daily, Hardie Leyland B, NP, 40 mg at 03/12/24 0959   polyethylene glycol (MIRALAX / GLYCOLAX) packet 17 g, 17 g, Oral, Daily PRN, Alayne Hubert, Stephanie M, PA-C   polyethylene glycol (MIRALAX / GLYCOLAX) packet 17 g, 17 g, Oral, Daily, Oralee Billow I, RPH   pregabalin  (LYRICA ) capsule 50 mg, 50 mg, Oral, BID, Hardie Leyland B, NP, 50 mg at 03/12/24 1610   primidone (MYSOLINE) tablet 50 mg, 50 mg, Oral, BID, Rushton Early, MD, 50 mg at 03/12/24 0957    propranolol (INDERAL) tablet 40 mg, 40 mg, Oral, BID, Hardie Leyland B, NP, 40 mg at 03/12/24 1000   sertraline (ZOLOFT) tablet 50 mg, 50 mg, Oral, Daily, Hardie Leyland B, NP, 50 mg at 03/12/24 0958  Labs and Diagnostic Imaging   CBC:  Recent Labs  Lab 03/10/24 1044 03/10/24 1904 03/11/24 0857  WBC 5.5 7.5 6.9  NEUTROABS 4.2 6.3  --   HGB 13.5 13.1 13.3  HCT 42.2 38.9 39.8  MCV 89.8 88.0 86.5  PLT 178 171 206    Basic Metabolic Panel:  Lab Results  Component Value Date   NA 143 03/11/2024   K 3.2 (L) 03/11/2024   CO2 25 03/11/2024   GLUCOSE 133 (H) 03/11/2024   BUN 5 (L) 03/11/2024   CREATININE 0.92 03/11/2024   CALCIUM 9.1 03/11/2024   GFRNONAA >60 03/11/2024   GFRAA >60 09/25/2019   Lipid Panel: No results found for: "LDLCALC" HgbA1c: No results found for: "HGBA1C" Urine Drug Screen:     Component Value Date/Time   LABOPIA NONE DETECTED 03/10/2024 1126   COCAINSCRNUR NONE DETECTED 03/10/2024 1126   LABBENZ NONE DETECTED 03/10/2024 1126   AMPHETMU NONE DETECTED 03/10/2024 1126   THCU NONE DETECTED 03/10/2024 1126   LABBARB POSITIVE (A) 03/10/2024 1126    Alcohol Level     Component Value Date/Time   ETH <15 03/10/2024 1308   INR  Lab Results  Component Value Date   INR 1.1 03/10/2024   APTT No results found for: "APTT" AED levels: No results found for: "PHENYTOIN", "ZONISAMIDE", "LAMOTRIGINE", "LEVETIRACETA"  CT Head without contrast(Personally reviewed): No acute or traumatic finding. Chronic white matter lesions consistent with the patient's chronic demyelinating disease.  MRI Brain(Personally reviewed):  No acute process  LTM EEG 5/8 :   This study is within normal limits. No seizures or epileptiform discharges were seen throughout the recording.   Assessment   Shannon Benitez is a 53 y.o. female with a history of multiple sclerosis as well as seizures on a very large number of very sedating medications who was found unresponsive and  hypotensive.  Likely acute metabolic encephalopathy due to polypharmacy   Recommendations  D/c LTM  Continue home AEDs Neurology will signoff. Please call with questions or concerns  ______________________________________________________________________   Signed, Laymond Priestly, NP [facility]  NEUROHOSPITALIST ADDENDUM Performed a face to face diagnostic evaluation.   I have reviewed the contents of history and physical exam as documented by PA/ARNP/Resident and agree with above documentation.  I have discussed and formulated the above plan as documented. Edits to the note have been made as needed.  Impression/Key exam findings/Plan:Suspect metabolic encephalopathy secondary to polypharamcy. No deficits and she is back to her baseline. We will signoff. Plan discussed with Dr. Hilton Lucky.  Haleem Hanner, MD [facility] [phone]   If 7pm to 7am, please call on call as listed on AMION.

## 2024-03-12 NOTE — Progress Notes (Signed)
 LTM EEG D/C. No noted skin break down. [facility] notified.

## 2024-03-12 NOTE — Evaluation (Signed)
 Occupational Therapy Evaluation Patient Details Name: Shannon Benitez MRN: [id number] DOB: 1971-06-20 Today's Date: 03/12/2024   History of Present Illness   Pt is a 53 y.o. female who presented 03/10/24 after being found unresponsive. Intubated & extubated 5/6.Admitted with seizure vs acute encephalopathy with potential polypharmacy contribution. PMH: MS, seizures, DM, GERD, vertigo, OA     Clinical Impressions Pt admitted for above, PTA pt reports being independent with ADLs and ambulating mod I using RW, driving as well. Pt presenting with impaired cognition and slow processing, ambulating ~61ft with CGA and completing ADLs with min A to CGA. She has poor topographical awareness and was not able to navigate her way back to her room following short distance ambulation. Notified SLP team of need for a more thorough cognitive assessment. OT to ocntinue following pt acutely to address listed deficits and help transition to next level of care. Patient has the potential to reach Mod I and demos the ability to tolerate 3 hours of therapy. Pt would benefit from an intensive rehab program to help maximize functional independence.      If plan is discharge home, recommend the following:   Assistance with cooking/housework;Direct supervision/assist for financial management;Supervision due to cognitive status;Direct supervision/assist for medications management;A little help with bathing/dressing/bathroom;Assist for transportation     Functional Status Assessment   Patient has had a recent decline in their functional status and demonstrates the ability to make significant improvements in function in a reasonable and predictable amount of time.     Equipment Recommendations   None recommended by OT (pt has rec DME)     Recommendations for Other Services    (n/a)     Precautions/Restrictions   Precautions Precautions: Fall Recall of Precautions/Restrictions:  Impaired Restrictions Weight Bearing Restrictions Per Provider Order: No     Mobility Bed Mobility Overal bed mobility: Needs Assistance Bed Mobility: Supine to Sit     Supine to sit: Contact guard, Used rails     General bed mobility comments: increased time    Transfers Overall transfer level: Needs assistance Equipment used: Rolling walker (2 wheels) Transfers: Sit to/from Stand Sit to Stand: Contact guard assist           General transfer comment: cues for hand placement      Balance Overall balance assessment: Needs assistance Sitting-balance support: No upper extremity supported, Feet supported Sitting balance-Leahy Scale: Good Sitting balance - Comments: slight post lean with dynamic activities EOB. Postural control: Posterior lean Standing balance support: Bilateral upper extremity supported, During functional activity, No upper extremity supported, Single extremity supported Standing balance-Leahy Scale: Fair Standing balance comment: no overt LOB                           ADL either performed or assessed with clinical judgement   ADL Overall ADL's : Needs assistance/impaired Eating/Feeding: Independent;Sitting   Grooming: Contact guard assist;Standing;Wash/dry hands   Upper Body Bathing: Sitting;Contact guard assist   Lower Body Bathing: Sitting/lateral leans;Minimal assistance   Upper Body Dressing : Sitting;Set up   Lower Body Dressing: Sit to/from stand;Minimal assistance   Toilet Transfer: Contact guard assist;Rolling walker (2 wheels);Ambulation   Toileting- Clothing Manipulation and Hygiene: Contact guard assist;Sit to/from stand       Functional mobility during ADLs: Contact guard assist;Rolling walker (2 wheels)       Vision Baseline Vision/History: 0 No visual deficits Patient Visual Report: No change from baseline Vision Assessment?: No apparent visual deficits  Additional Comments: Reading objects around room and  idenifying # of therapists fingers without challenge.     Perception Perception: Impaired Preception Impairment Details: Topographical orientation Perception-Other Comments: did not get to assess figure ground or spatial orientation.   Praxis Praxis: Impaired Praxis Impairment Details: Organization Praxis-Other Comments: slow processing.   Pertinent Vitals/Pain Pain Assessment Pain Assessment: No/denies pain Pain Score: 0-No pain     Extremity/Trunk Assessment Upper Extremity Assessment Upper Extremity Assessment: Generalized weakness       Cervical / Trunk Assessment Cervical / Trunk Assessment: Normal   Communication Communication Communication: No apparent difficulties   Cognition Arousal: Alert Behavior During Therapy: Impulsive Cognition: Cognition impaired   Orientation impairments:  (A&Ox4) Awareness: Online awareness impaired, Intellectual awareness impaired   Attention impairment (select first level of impairment): Focused attention, Sustained attention, Selective attention Executive functioning impairment (select all impairments): Organization, Reasoning, Problem solving OT - Cognition Comments: not able to find her back to the room after ambulating 70ft then making a Left turn an extra 15 ft                 Following commands: Impaired Following commands impaired: Only follows one step commands consistently     Cueing  General Comments   Cueing Techniques: Verbal cues;Tactile cues  Pt son works 8 hours a day and lives with her.   Exercises     Shoulder Instructions      Home Living Family/patient expects to be discharged to:: Private residence Living Arrangements: Children Available Help at Discharge: Family;Available PRN/intermittently Type of Home: House Home Access: Stairs to enter [employer] of Steps: 3 Entrance Stairs-Rails: Right;Left Home Layout: One level;Laundry or work area in basement (does not need to access  basement)     Bathroom Shower/Tub: Tub/shower unit         Home Equipment: Agricultural consultant (2 wheels);BSC/3in1;Shower seat   Additional Comments: son works nights  Lives With: Son    Prior Functioning/Environment Prior Level of Function : Independent/Modified Independent;Driving             Mobility Comments: Uses RW ADLs Comments: does not work, ind    OT Problem List: Impaired balance (sitting and/or standing);Decreased cognition;Decreased strength   OT Treatment/Interventions: Self-care/ADL training;Patient/family education;Therapeutic exercise;Balance training;Therapeutic activities;DME and/or AE instruction;Cognitive remediation/compensation      OT Goals(Current goals can be found in the care plan section)   Acute Rehab OT Goals Patient Stated Goal: To get stronger OT Goal Formulation: With patient Time For Goal Achievement: 03/26/24 Potential to Achieve Goals: Good ADL Goals Pt Will Perform Grooming: Independently;standing Pt Will Perform Lower Body Dressing: with modified independence;sit to/from stand Pt Will Transfer to Toilet: with modified independence;ambulating Pt Will Perform Toileting - Clothing Manipulation and hygiene: with modified independence;sit to/from stand Pt Will Perform Tub/Shower Transfer: with modified independence;ambulating;Tub transfer Additional ADL Goal #2: Pt will demonstrate ability to complete medication management with supervision   OT Frequency:  Min 2X/week    Co-evaluation              AM-PAC OT "6 Clicks" Daily Activity     Outcome Measure Help from another person eating meals?: None Help from another person taking care of personal grooming?: A Little Help from another person toileting, which includes using toliet, bedpan, or urinal?: A Little Help from another person bathing (including washing, rinsing, drying)?: A Little Help from another person to put on and taking off regular upper body clothing?: A  Little Help from another person  to put on and taking off regular lower body clothing?: A Little 6 Click Score: 19   End of Session Equipment Utilized During Treatment: Gait belt;Rolling walker (2 wheels) Nurse Communication: Mobility status (+1 RW)  Activity Tolerance: Patient tolerated treatment well Patient left: Other (comment) (Pt left on toilet with PT at her side providing assistance)  OT Visit Diagnosis: Other symptoms and signs involving cognitive function;Other abnormalities of gait and mobility (R26.89);Unsteadiness on feet (R26.81)                Time: 1610-9604 OT Time Calculation (min): 29 min Charges:  OT General Charges $OT Visit: 1 Visit OT Evaluation $OT Eval Low Complexity: 1 Low OT Treatments $Self Care/Home Management : 8-22 mins  03/12/2024  AB, OTR/L  Acute Rehabilitation Services  Office: [phone]   Jorene New 03/12/2024, 3:57 PM

## 2024-03-12 NOTE — Procedures (Addendum)
 Patient Name: Shannon Benitez  MRN: [id number]  Epilepsy Attending: Arleene Lack  Referring Physician/Provider: Augustin Leber, MD  Duration: 03/12/2024 0447 to 03/12/2024 0910   Patient history: 53 y.o. female with hx of MS and prior seizure who was found down and hypotensive in her house today. EEG to evaluate for seizure.   Level of alertness: Awake   AEDs during EEG study: None   Technical aspects: This EEG study was done with scalp electrodes positioned according to the 10-20 International system of electrode placement. Electrical activity was reviewed with band pass filter of 1-70Hz , sensitivity of 7 uV/mm, display speed of 65mm/sec with a 60Hz  notched filter applied as appropriate. EEG data were recorded continuously and digitally stored.  Video monitoring was available and reviewed as appropriate.   Description: The posterior dominant rhythm consists of 8 Hz activity of moderate voltage (25-35 uV) seen predominantly in posterior head regions, symmetric and reactive to eye opening and eye closing. Hyperventilation and photic stimulation were not performed.    Of note, EEG was technically difficult due to near constant chewing artifact.   IMPRESSION: This study is within normal limits. No seizures or epileptiform discharges were seen throughout the recording.   A normal interictal EEG does not exclude the diagnosis of epilepsy.     Skii Cleland O Chloee Tena

## 2024-03-12 NOTE — Progress Notes (Signed)
 PROGRESS NOTE    Shannon Benitez  ZOX:[id number] DOB: May 24, 1971 DOA: 03/10/2024 PCP: Edgar Goods, MD    Brief Narrative:  Patient is 53 year old with history of multiple sclerosis, chronic pain syndrome on extensive medications, prior history of seizure about 20 years ago as per patient who was found at home unresponsive in urine and feces by her son when he came back home from work overnight.  Patient does not remember the events.  She apparently also fell forward and hit on the furniture's.  Was brought to [facility] emergency room where she was bradycardic and hypotensive.  She was given IV fluid, atropine, Levophed.  There was no response to Narcan  so she was intubated and transferred to [facility] ICU. Seen by Neurology on arrival.  5/6, admitted to ICU intubated and started on seizure medicines  5/7 extubated , back on pain regimen. 5/8 to medical floor   Subjective: Seen and examined. Poor historian but denies any other symptoms. She tells me she can probably walk with a walker. On LTM, no events noted overnight.  Patient is asking whether she needs infusion for her multiple sclerosis. Patient tells me that she might have taken all medication at once instead of spacing them out.   Assessment & Plan:   Acute metabolic encephalopathy and unresponsiveness: Likely polypharmacy and suspected seizure disorder. EEG and continuous EEG currently without any evidence of seizure. With h/o seizure , patient on Lamictal and Keppra that is resumed. Neurology following. Mental status has improved.  Multiple sclerosis, chronic pain syndrome, multiple medications with risk of polypharmacy: Patient on baclofen , Lamictal, Keppra, oxycodone , primidone, Zoloft and propranolol. Very high risk of polypharmacy. Resume with the lower dose of baclofen  10 mg 3 times daily, dose reduced Continued on Lamictal.  Continued on Lyrica  50 mg twice daily.  Primidone 50 mg twice daily.  Propranolol 40 mg twice daily.   Zoloft 50 mg daily, dose reduced from 100 mg daily.  Traumatic rhabdomyolysis: CK was more than thousand.  Treated with IV fluids.  Improving.  Recheck tomorrow morning.  Acute respiratory insufficiency secondary to encephalopathy: Improved.  On room air now.  Hypokalemia and hypomagnesemia: Replaced.  Recheck tomorrow.  Overactive bladder: Restart Myrbetriq.  Type 2 diabetes: Well-controlled.  On metformin and Januvia at home.  Currently on sliding scale insulin.  Hyperlipidemia: On statin.  Holding until CK levels improved.  Depression: On Zoloft.  On reduced dose.  GERD: On PPI.  Physical debility: Refer to rehab.  DVT prophylaxis: heparin injection 5,000 Units Start: 03/11/24 0600 SCDs Start: 03/10/24 1649   Code Status: Full code Family Communication: None at the bedside.  Will update Disposition Plan: Status is: Inpatient Remains inpatient appropriate because: On continuous EEG.  Needs rehab.  Medically stable to rehab level of care.     Consultants:  Neurology Critical care  Procedures:  Long-term EEG  Antimicrobials:  None     Objective: Vitals:   03/11/24 2338 03/12/24 0401 03/12/24 0734 03/12/24 1125  BP: (!) 150/74 136/62 (!) 158/74 (!) 151/76  Pulse: 82 81 79 80  Resp: 18 18 18 20   Temp: 98.8 F (37.1 C) 99.8 F (37.7 C) 98.9 F (37.2 C) 98.9 F (37.2 C)  TempSrc: Oral Oral Oral Oral  SpO2: 95% 92% 94% 96%  Weight:      Height:        Intake/Output Summary (Last 24 hours) at 03/12/2024 1135 Last data filed at 03/11/2024 1500 Gross per 24 hour  Intake --  Output 2200 ml  Net -2200 ml   Filed Weights   03/10/24 1716 03/11/24 0500  Weight: 96.6 kg 94.5 kg    Examination:  General exam: Appears calm and comfortable  Frail debilitated.  Older than stated age. Respiratory system: Clear to auscultation. Respiratory effort normal.  No added sounds. Cardiovascular system: S1 & S2 heard, RRR. No pedal edema. Gastrointestinal system: Soft  and nontender.  Bowel sound present. Central nervous system: Alert and oriented. No focal neurological deficits.  Gross generalized weakness.     Data Reviewed: I have personally reviewed following labs and imaging studies  CBC: Recent Labs  Lab 03/10/24 1044 03/10/24 1904 03/11/24 0857  WBC 5.5 7.5 6.9  NEUTROABS 4.2 6.3  --   HGB 13.5 13.1 13.3  HCT 42.2 38.9 39.8  MCV 89.8 88.0 86.5  PLT 178 171 206   Basic Metabolic Panel: Recent Labs  Lab 03/10/24 1044 03/10/24 1904 03/11/24 0857  NA 137 138 143  K 2.9* 2.5* 3.2*  CL 102 106 105  CO2 25 21* 25  GLUCOSE 101* 115* 133*  BUN 13 10 5*  CREATININE 1.32* 1.08* 0.92  CALCIUM 9.2 8.5* 9.1  MG 1.7 1.3* 2.2  PHOS 4.3 3.7  --    GFR: Estimated Creatinine Clearance: 77.3 mL/min (by C-G formula based on SCr of 0.92 mg/dL). Liver Function Tests: Recent Labs  Lab 03/10/24 1044 03/10/24 1904 03/11/24 0857  AST 31 32 27  ALT 14 14 15   ALKPHOS 65 61 74  BILITOT 0.7 0.8 1.0  PROT 7.1 6.3* 6.7  ALBUMIN 3.5 3.0* 3.1*   No results for input(s): "LIPASE", "AMYLASE" in the last 168 hours. No results for input(s): "AMMONIA" in the last 168 hours. Coagulation Profile: Recent Labs  Lab 03/10/24 1308 03/10/24 1904  INR 1.0 1.1   Cardiac Enzymes: Recent Labs  Lab 03/10/24 1134 03/11/24 0857  CKTOTAL 1,000* 901*   BNP (last 3 results) No results for input(s): "PROBNP" in the last 8760 hours. HbA1C: No results for input(s): "HGBA1C" in the last 72 hours. CBG: Recent Labs  Lab 03/11/24 0345 03/11/24 0755 03/11/24 1147 03/11/24 1549 03/12/24 0623  GLUCAP 126* 118* 217* 127* 141*   Lipid Profile: No results for input(s): "CHOL", "HDL", "LDLCALC", "TRIG", "CHOLHDL", "LDLDIRECT" in the last 72 hours. Thyroid Function Tests: No results for input(s): "TSH", "T4TOTAL", "FREET4", "T3FREE", "THYROIDAB" in the last 72 hours. Anemia Panel: No results for input(s): "VITAMINB12", "FOLATE", "FERRITIN", "TIBC", "IRON ",  "RETICCTPCT" in the last 72 hours. Sepsis Labs: Recent Labs  Lab 03/10/24 1044 03/10/24 1308 03/10/24 1904 03/10/24 2208  LATICACIDVEN 2.1* 1.7 1.0 0.7    Recent Results (from the past 240 hours)  Blood Culture (routine x 2)     Status: None (Preliminary result)   Collection Time: 03/10/24 11:05 AM   Specimen: BLOOD  Result Value Ref Range Status   Specimen Description BLOOD RIGHT ANTECUBITAL  Final   Special Requests   Final    BOTTLES DRAWN AEROBIC AND ANAEROBIC Blood Culture adequate volume   Culture   Final    NO GROWTH 2 DAYS Performed at [facility], [address], [place], [place] [address]    Report Status PENDING  Incomplete  Blood Culture (routine x 2)     Status: None (Preliminary result)   Collection Time: 03/10/24 11:10 AM   Specimen: BLOOD  Result Value Ref Range Status   Specimen Description BLOOD LEFT ANTECUBITAL  Final   Special Requests   Final    BOTTLES DRAWN  AEROBIC AND ANAEROBIC Blood Culture results may not be optimal due to an inadequate volume of blood received in culture bottles   Culture   Final    NO GROWTH 2 DAYS Performed at [facility], [address] [place], [place] [address]    Report Status PENDING  Incomplete  Resp panel by RT-PCR (RSV, Flu A&B, Covid) Anterior Nasal Swab     Status: None   Collection Time: 03/10/24 11:26 AM   Specimen: Anterior Nasal Swab  Result Value Ref Range Status   SARS Coronavirus 2 by RT PCR NEGATIVE NEGATIVE Final    Comment: (NOTE) SARS-CoV-2 target nucleic acids are NOT DETECTED.  The SARS-CoV-2 RNA is generally detectable in upper respiratory specimens during the acute phase of infection. The lowest concentration of SARS-CoV-2 viral copies this assay can detect is 138 copies/mL. A negative result does not preclude SARS-Cov-2 infection and should not be used as the sole basis for treatment or other patient management decisions. A negative result may occur with  improper specimen  collection/handling, submission of specimen other than nasopharyngeal swab, presence of viral mutation(s) within the areas targeted by this assay, and inadequate number of viral copies(<138 copies/mL). A negative result must be combined with clinical observations, patient history, and epidemiological information. The expected result is Negative.  Fact Sheet for Patients:  BloggerCourse.com  Fact Sheet for Healthcare Providers:  SeriousBroker.it  This test is no t yet approved or cleared by the [place]  FDA and  has been authorized for detection and/or diagnosis of SARS-CoV-2 by FDA under an Emergency Use Authorization (EUA). This EUA will remain  in effect (meaning this test can be used) for the duration of the COVID-19 declaration under Section 564(b)(1) of the Act, 21 U.S.C.section 360bbb-3(b)(1), unless the authorization is terminated  or revoked sooner.       Influenza A by PCR NEGATIVE NEGATIVE Final   Influenza B by PCR NEGATIVE NEGATIVE Final    Comment: (NOTE) The Xpert Xpress SARS-CoV-2/FLU/RSV plus assay is intended as an aid in the diagnosis of influenza from Nasopharyngeal swab specimens and should not be used as a sole basis for treatment. Nasal washings and aspirates are unacceptable for Xpert Xpress SARS-CoV-2/FLU/RSV testing.  Fact Sheet for Patients: BloggerCourse.com  Fact Sheet for Healthcare Providers: SeriousBroker.it  This test is not yet approved or cleared by the [place]  FDA and has been authorized for detection and/or diagnosis of SARS-CoV-2 by FDA under an Emergency Use Authorization (EUA). This EUA will remain in effect (meaning this test can be used) for the duration of the COVID-19 declaration under Section 564(b)(1) of the Act, 21 U.S.C. section 360bbb-3(b)(1), unless the authorization is terminated or revoked.     Resp Syncytial  Virus by PCR NEGATIVE NEGATIVE Final    Comment: (NOTE) Fact Sheet for Patients: BloggerCourse.com  Fact Sheet for Healthcare Providers: SeriousBroker.it  This test is not yet approved or cleared by the [place]  FDA and has been authorized for detection and/or diagnosis of SARS-CoV-2 by FDA under an Emergency Use Authorization (EUA). This EUA will remain in effect (meaning this test can be used) for the duration of the COVID-19 declaration under Section 564(b)(1) of the Act, 21 U.S.C. section 360bbb-3(b)(1), unless the authorization is terminated or revoked.  Performed at [facility], [address] [place], [place] [address]   MRSA Next Gen by PCR, Nasal     Status: None   Collection Time: 03/10/24  4:50 PM   Specimen: Nasal Mucosa; Nasal  Swab  Result Value Ref Range Status   MRSA by PCR Next Gen NOT DETECTED NOT DETECTED Final    Comment: (NOTE) The GeneXpert MRSA Assay (FDA approved for NASAL specimens only), is one component of a comprehensive MRSA colonization surveillance program. It is not intended to diagnose MRSA infection nor to guide or monitor treatment for MRSA infections. Test performance is not FDA approved in patients less than 48 years old. Performed at [facility], [address], [place], [place] [address]          Radiology Studies: ECHOCARDIOGRAM COMPLETE Result Date: 03/11/2024    ECHOCARDIOGRAM REPORT   Patient Name:   GILBERTE NASSAR Date of Exam: 03/11/2024 Medical Rec #:  [id number]          Height:       63.0 in Accession #:    [id number]         Weight:       208.3 lb Date of Birth:  04-Nov-1971          BSA:          1.967 m Patient Age:    52 years           BP:           141/76 mmHg Patient Gender: F                  HR:           70 bpm. Exam Location:  Inpatient Procedure: 2D Echo, Color Doppler, Cardiac Doppler and Intracardiac            Opacification Agent (Both Spectral  and Color Flow Doppler were            utilized during procedure). Indications:    "Unresponsiveness"  History:        Patient has prior history of Echocardiogram examinations. Risk                 Factors:Diabetes.  Sonographer:    Sherline Distel Senior RDCS Referring Phys: [phone] STEPHANIE M REESE IMPRESSIONS  1. Left ventricular ejection fraction, by estimation, is 60 to 65%. The left ventricle has normal function. The left ventricle has no regional wall motion abnormalities. Left ventricular diastolic parameters are consistent with Grade I diastolic dysfunction (impaired relaxation).  2. Right ventricular systolic function is normal. The right ventricular size is normal.  3. The mitral valve is normal in structure. No evidence of mitral valve regurgitation. No evidence of mitral stenosis.  4. The aortic valve is normal in structure. Aortic valve regurgitation is not visualized. No aortic stenosis is present.  5. The inferior vena cava is normal in size with greater than 50% respiratory variability, suggesting right atrial pressure of 3 mmHg. FINDINGS  Left Ventricle: Left ventricular ejection fraction, by estimation, is 60 to 65%. The left ventricle has normal function. The left ventricle has no regional wall motion abnormalities. Definity contrast agent was given IV to delineate the left ventricular  endocardial borders. The left ventricular internal cavity size was normal in size. There is no left ventricular hypertrophy. Left ventricular diastolic parameters are consistent with Grade I diastolic dysfunction (impaired relaxation). Right Ventricle: The right ventricular size is normal. No increase in right ventricular wall thickness. Right ventricular systolic function is normal. Left Atrium: Left atrial size was normal in size. Right Atrium: Right atrial size was normal in size. Pericardium: There is no evidence of pericardial effusion. Mitral Valve: The mitral valve is normal  in structure. No evidence of mitral valve  regurgitation. No evidence of mitral valve stenosis. Tricuspid Valve: The tricuspid valve is normal in structure. Tricuspid valve regurgitation is not demonstrated. No evidence of tricuspid stenosis. Aortic Valve: The aortic valve is normal in structure. Aortic valve regurgitation is not visualized. No aortic stenosis is present. Pulmonic Valve: The pulmonic valve was normal in structure. Pulmonic valve regurgitation is not visualized. No evidence of pulmonic stenosis. Aorta: The aortic root is normal in size and structure. Venous: The inferior vena cava is normal in size with greater than 50% respiratory variability, suggesting right atrial pressure of 3 mmHg. IAS/Shunts: No atrial level shunt detected by color flow Doppler.  LEFT VENTRICLE PLAX 2D LVIDd:         4.50 cm   Diastology LVIDs:         3.00 cm   LV e' medial:    6.99 cm/s LV PW:         0.90 cm   LV E/e' medial:  10.9 LV IVS:        1.00 cm   LV e' lateral:   8.39 cm/s LVOT diam:     2.20 cm   LV E/e' lateral: 9.1 LV SV:         103 LV SV Index:   52 LVOT Area:     3.80 cm  RIGHT VENTRICLE RV S prime:     12.90 cm/s TAPSE (M-mode): 2.3 cm LEFT ATRIUM             Index        RIGHT ATRIUM           Index LA diam:        4.00 cm 2.03 cm/m   RA Area:     17.40 cm LA Vol (A2C):   49.7 ml 25.26 ml/m  RA Volume:   48.50 ml  24.65 ml/m LA Vol (A4C):   49.8 ml 25.31 ml/m LA Biplane Vol: 49.4 ml 25.11 ml/m  AORTIC VALVE LVOT Vmax:   126.00 cm/s LVOT Vmean:  80.200 cm/s LVOT VTI:    0.270 m  AORTA Ao Root diam: 2.70 cm Ao Asc diam:  2.90 cm MITRAL VALVE MV Area (PHT): 2.93 cm    SHUNTS MV Decel Time: 259 msec    Systemic VTI:  0.27 m MV E velocity: 76.40 cm/s  Systemic Diam: 2.20 cm MV A velocity: 85.20 cm/s MV E/A ratio:  0.90 Dorothye Gathers MD Electronically signed by Dorothye Gathers MD Signature Date/Time: 03/11/2024/2:53:12 PM    Final    Overnight EEG with video Result Date: 03/11/2024 Arleene Lack, MD     03/12/2024  8:44 AM Patient Name: Carter Meise MRN: [id number] Epilepsy Attending: Arleene Lack Referring Physician/Provider: Augustin Leber, MD Duration: 03/11/2024 0447 to 03/12/2024 0447 Patient history: 53 y.o. female with hx of MS and prior seizure who was found down and hypotensive in her house today. EEG to evaluate for seizure. Level of alertness: Awake AEDs during EEG study: None Technical aspects: This EEG study was done with scalp electrodes positioned according to the 10-20 International system of electrode placement. Electrical activity was reviewed with band pass filter of 1-70Hz , sensitivity of 7 uV/mm, display speed of 17mm/sec with a 60Hz  notched filter applied as appropriate. EEG data were recorded continuously and digitally stored.  Video monitoring was available and reviewed as appropriate. Description: The posterior dominant rhythm consists of 8 Hz activity of moderate voltage (25-35 uV) seen  predominantly in posterior head regions, symmetric and reactive to eye opening and eye closing. Hyperventilation and photic stimulation were not performed.   Of note, EEG was technically difficult due to near constant chewing artifact. IMPRESSION: This study is within normal limits. No seizures or epileptiform discharges were seen throughout the recording. A normal interictal EEG does not exclude the diagnosis of epilepsy. Arleene Lack   MR BRAIN W WO CONTRAST Result Date: 03/11/2024 CLINICAL DATA:  Neuro deficit, acute, stroke suspected EXAM: MRI HEAD WITHOUT AND WITH CONTRAST TECHNIQUE: Multiplanar, multiecho pulse sequences of the brain and surrounding structures were obtained without and with intravenous contrast. CONTRAST:  10mL GADAVIST GADOBUTROL 1 MMOL/ML IV SOLN COMPARISON:  Same day CT head.  MRI head February 01, 2024. FINDINGS: Brain: No acute infarction, hemorrhage, hydrocephalus, extra-axial collection or mass lesion. Similar moderate, age advanced T2/FLAIR hyperintensity in the white matter, compatible with known  multiple sclerosis. No new lesions identified. No pathologic enhancement. Vascular: Major arterial flow voids are maintained at the skull base. Skull and upper cervical spine: Normal marrow signal. Sinuses/Orbits: Clear sinuses.  No acute orbital findings. Other: No sizable mastoid effusions. IMPRESSION: 1. No evidence of acute intracranial abnormality. 2. Similar moderate, age-advanced T2/FLAIR hyperintensity in the white matter, compatible with known multiple sclerosis. No new lesions or enhancing lesions. Electronically Signed   By: Stevenson Elbe M.D.   On: 03/11/2024 01:30   DG Chest Port 1 View Result Date: 03/10/2024 CLINICAL DATA:  ET tube placement EXAM: PORTABLE CHEST 1 VIEW COMPARISON:  03/10/2024 FINDINGS: Endotracheal tube is 4.5 cm above the carina. NG tube is in the stomach. Heart and mediastinal contours within normal limits. No confluent opacities, effusions or edema. No acute bony abnormality. IMPRESSION: Endotracheal tube 4.5 cm above the carina. No acute cardiopulmonary disease. Electronically Signed   By: Janeece Mechanic M.D.   On: 03/10/2024 17:15   CT Head Wo Contrast Result Date: 03/10/2024 CLINICAL DATA:  Found unresponsive. Fall with trauma to the head and neck. History of multiple sclerosis. EXAM: CT HEAD WITHOUT CONTRAST CT CERVICAL SPINE WITHOUT CONTRAST TECHNIQUE: Multidetector CT imaging of the head and cervical spine was performed following the standard protocol without intravenous contrast. Multiplanar CT image reconstructions of the cervical spine were also generated. RADIATION DOSE REDUCTION: This exam was performed according to the departmental dose-optimization program which includes automated exposure control, adjustment of the mA and/or kV according to patient size and/or use of iterative reconstruction technique. COMPARISON:  MRI 02/01/2024 FINDINGS: CT HEAD FINDINGS Brain: Mild generalized brain volume loss. Chronic white matter lesions primarily of the deep white matter  consistent with the patient's chronic demyelinating disease. No sign of acute brain lesion, mass, hemorrhage, hydrocephalus or extra-axial collection. Vascular: No abnormal vascular finding. Skull: Normal Sinuses/Orbits: Clear/normal Other: none CT CERVICAL SPINE FINDINGS Alignment: No malalignment. Skull base and vertebrae: No fracture or focal bone lesion. Soft tissues and spinal canal: No traumatic soft tissue finding. Endotracheal intubation. Disc levels: No disc level pathology. No degenerative change. No stenosis of the canal or foramina. No facet arthropathy. Upper chest: Mild dependent pulmonary atelectasis. Other: None IMPRESSION: HEAD CT: No acute or traumatic finding. Chronic white matter lesions consistent with the patient's chronic demyelinating disease. CERVICAL SPINE CT: No acute or traumatic finding.  Normal CT appearance. Electronically Signed   By: Bettylou Brunner M.D.   On: 03/10/2024 11:57   CT Cervical Spine Wo Contrast Result Date: 03/10/2024 CLINICAL DATA:  Found unresponsive. Fall with trauma to the head and  neck. History of multiple sclerosis. EXAM: CT HEAD WITHOUT CONTRAST CT CERVICAL SPINE WITHOUT CONTRAST TECHNIQUE: Multidetector CT imaging of the head and cervical spine was performed following the standard protocol without intravenous contrast. Multiplanar CT image reconstructions of the cervical spine were also generated. RADIATION DOSE REDUCTION: This exam was performed according to the departmental dose-optimization program which includes automated exposure control, adjustment of the mA and/or kV according to patient size and/or use of iterative reconstruction technique. COMPARISON:  MRI 02/01/2024 FINDINGS: CT HEAD FINDINGS Brain: Mild generalized brain volume loss. Chronic white matter lesions primarily of the deep white matter consistent with the patient's chronic demyelinating disease. No sign of acute brain lesion, mass, hemorrhage, hydrocephalus or extra-axial collection.  Vascular: No abnormal vascular finding. Skull: Normal Sinuses/Orbits: Clear/normal Other: none CT CERVICAL SPINE FINDINGS Alignment: No malalignment. Skull base and vertebrae: No fracture or focal bone lesion. Soft tissues and spinal canal: No traumatic soft tissue finding. Endotracheal intubation. Disc levels: No disc level pathology. No degenerative change. No stenosis of the canal or foramina. No facet arthropathy. Upper chest: Mild dependent pulmonary atelectasis. Other: None IMPRESSION: HEAD CT: No acute or traumatic finding. Chronic white matter lesions consistent with the patient's chronic demyelinating disease. CERVICAL SPINE CT: No acute or traumatic finding.  Normal CT appearance. Electronically Signed   By: Bettylou Brunner M.D.   On: 03/10/2024 11:57        Scheduled Meds:  baclofen   10 mg Oral TID   Chlorhexidine Gluconate Cloth  6 each Topical Daily   docusate sodium  100 mg Oral BID   heparin  5,000 Units Subcutaneous Q8H   insulin aspart  0-9 Units Subcutaneous TID WC   lamoTRIgine  50 mg Oral BID   levETIRAcetam  500 mg Oral BID   mirabegron ER  50 mg Oral QPM   pantoprazole  40 mg Oral Daily   polyethylene glycol  17 g Oral Daily   pregabalin   50 mg Oral BID   primidone  50 mg Oral BID   propranolol  40 mg Oral BID   sertraline  50 mg Oral Daily   Continuous Infusions:   LOS: 2 days    Time spent: 55 minutes    Vada Garibaldi, MD [facility]

## 2024-03-12 NOTE — Progress Notes (Signed)
 Physical Therapy Treatment Patient Details Name: Shannon Benitez MRN: [id number] DOB: Oct 07, 1971 Today's Date: 03/12/2024   History of Present Illness Pt is a 53 y.o. female who presented 03/10/24 after being found unresponsive. Intubated & extubated 5/6.Admitted with seizure vs acute encephalopathy with potential polypharmacy contribution. PMH: MS, seizures, DM, GERD, vertigo, OA    PT Comments  Pt progressing well towards goals. Pt is currently supervision for bed mobility, sit to stand, gait and CGA for stairs per home set up. Pt has poor safety awareness and has impaired balance with a high risk for falls. Pt lives with son who is supportive. Due to pt current functional status, home set up and available assistance at home recommending skilled physical therapy services > 3 hours/day in order to address strength, balance and functional mobility to decrease risk for falls, injury, immobility, skin break down and re-hospitalization.      If plan is discharge home, recommend the following: Assist for transportation;Supervision due to cognitive status;A little help with walking and/or transfers;Assistance with cooking/housework;Help with stairs or ramp for entrance     Equipment Recommendations  None recommended by PT       Precautions / Restrictions Precautions Precautions: Fall;Other (comment) Recall of Precautions/Restrictions: Impaired Precaution/Restrictions Comments: EEG Restrictions Weight Bearing Restrictions Per Provider Order: No     Mobility  Bed Mobility Overal bed mobility: Needs Assistance Bed Mobility: Sit to Supine       Sit to supine: Supervision   General bed mobility comments: supervision and increased time.    Transfers Overall transfer level: Needs assistance Equipment used: Rolling walker (2 wheels) Transfers: Sit to/from Stand Sit to Stand: Supervision           General transfer comment: supervision for safety from toilet     Ambulation/Gait Ambulation/Gait assistance: Supervision Gait Distance (Feet): 150 Feet Assistive device: Rolling walker (2 wheels) Gait Pattern/deviations: Step-to pattern, Decreased step length - right, Decreased step length - left, Decreased stride length Gait velocity: decreased Gait velocity interpretation: 1.31 - 2.62 ft/sec, indicative of limited community ambulator   General Gait Details: no significant deviations with gait. Supervision for safety   Stairs Stairs: Yes Stairs assistance: Contact guard assist Stair Management: Two rails, Alternating pattern, Forwards, Step to pattern Number of Stairs: 2 General stair comments: alternating ascending and step to gait pattern descending.   Modified Rankin (Stroke Patients Only) Modified Rankin (Stroke Patients Only) Pre-Morbid Rankin Score: No symptoms Modified Rankin: Moderate disability     Balance Overall balance assessment: Needs assistance Sitting-balance support: No upper extremity supported, Feet supported Sitting balance-Leahy Scale: Good     Standing balance support: Bilateral upper extremity supported, During functional activity, No upper extremity supported, Single extremity supported Standing balance-Leahy Scale: Fair Standing balance comment: no overt LOB        Communication Communication Communication: No apparent difficulties  Cognition Arousal: Alert Behavior During Therapy: Impulsive   PT - Cognitive impairments: Safety/Judgement     Following commands: Impaired Following commands impaired: Only follows one step commands consistently    Cueing Cueing Techniques: Verbal cues, Tactile cues     General Comments General comments (skin integrity, edema, etc.): Pt son works 8 hours a day and lives with her.      Pertinent Vitals/Pain Pain Assessment Pain Assessment: 0-10 Pain Score: 0-No pain           PT Goals (current goals can now be found in the care plan section) Acute Rehab PT  Goals Patient Stated Goal: to get  better & go home PT Goal Formulation: With patient Time For Goal Achievement: 03/25/24 Potential to Achieve Goals: Good Progress towards PT goals: Progressing toward goals    Frequency    Min 3X/week      PT Plan  Continue with current POC        AM-PAC PT "6 Clicks" Mobility   Outcome Measure  Help needed turning from your back to your side while in a flat bed without using bedrails?: A Little Help needed moving from lying on your back to sitting on the side of a flat bed without using bedrails?: A Little Help needed moving to and from a bed to a chair (including a wheelchair)?: A Little Help needed standing up from a chair using your arms (e.g., wheelchair or bedside chair)?: A Little Help needed to walk in hospital room?: A Little Help needed climbing 3-5 steps with a railing? : A Little 6 Click Score: 18    End of Session Equipment Utilized During Treatment: Gait belt Activity Tolerance: Patient tolerated treatment well Patient left: in bed;with bed alarm set;with call bell/phone within reach Nurse Communication: Mobility status PT Visit Diagnosis: Unsteadiness on feet (R26.81);Other abnormalities of gait and mobility (R26.89);Muscle weakness (generalized) (M62.81);Difficulty in walking, not elsewhere classified (R26.2);Other symptoms and signs involving the nervous system (R29.898)     Time: 8657-8469 PT Time Calculation (min) (ACUTE ONLY): 15 min  Charges:    $Therapeutic Activity: 8-22 mins PT General Charges $$ ACUTE PT VISIT: 1 Visit                     Sloan Duncans, DPT, CLT  Acute Rehabilitation Services Office: [phone] (Secure chat preferred)    Jenice Mitts 03/12/2024, 11:05 AM

## 2024-03-13 DIAGNOSIS — R4189 Other symptoms and signs involving cognitive functions and awareness: Secondary | ICD-10-CM | POA: Diagnosis not present

## 2024-03-13 LAB — CBC WITH DIFFERENTIAL/PLATELET
Abs Immature Granulocytes: 0.01 10*3/uL (ref 0.00–0.07)
Basophils Absolute: 0 10*3/uL (ref 0.0–0.1)
Basophils Relative: 1 %
Eosinophils Absolute: 0.1 10*3/uL (ref 0.0–0.5)
Eosinophils Relative: 2 %
HCT: 40.3 % (ref 36.0–46.0)
Hemoglobin: 13.6 g/dL (ref 12.0–15.0)
Immature Granulocytes: 0 %
Lymphocytes Relative: 11 %
Lymphs Abs: 0.6 10*3/uL — ABNORMAL LOW (ref 0.7–4.0)
MCH: 29.6 pg (ref 26.0–34.0)
MCHC: 33.7 g/dL (ref 30.0–36.0)
MCV: 87.8 fL (ref 80.0–100.0)
Monocytes Absolute: 0.6 10*3/uL (ref 0.1–1.0)
Monocytes Relative: 11 %
Neutro Abs: 3.9 10*3/uL (ref 1.7–7.7)
Neutrophils Relative %: 75 %
Platelets: 236 10*3/uL (ref 150–400)
RBC: 4.59 MIL/uL (ref 3.87–5.11)
RDW: 15.8 % — ABNORMAL HIGH (ref 11.5–15.5)
WBC: 5.2 10*3/uL (ref 4.0–10.5)
nRBC: 0 % (ref 0.0–0.2)

## 2024-03-13 LAB — MAGNESIUM: Magnesium: 1.4 mg/dL — ABNORMAL LOW (ref 1.7–2.4)

## 2024-03-13 LAB — COMPREHENSIVE METABOLIC PANEL WITH GFR
ALT: 14 U/L (ref 0–44)
AST: 19 U/L (ref 15–41)
Albumin: 3.2 g/dL — ABNORMAL LOW (ref 3.5–5.0)
Alkaline Phosphatase: 71 U/L (ref 38–126)
Anion gap: 13 (ref 5–15)
BUN: 6 mg/dL (ref 6–20)
CO2: 28 mmol/L (ref 22–32)
Calcium: 9.2 mg/dL (ref 8.9–10.3)
Chloride: 100 mmol/L (ref 98–111)
Creatinine, Ser: 0.55 mg/dL (ref 0.44–1.00)
GFR, Estimated: >60 mL/min (ref >60)
Glucose, Bld: 134 mg/dL — ABNORMAL HIGH (ref 70–99)
Potassium: 3.5 mmol/L (ref 3.5–5.1)
Sodium: 141 mmol/L (ref 135–145)
Total Bilirubin: 0.6 mg/dL (ref 0.0–1.2)
Total Protein: 7.2 g/dL (ref 6.5–8.1)

## 2024-03-13 LAB — CK: Total CK: 276 U/L — ABNORMAL HIGH (ref 38–234)

## 2024-03-13 LAB — GLUCOSE, CAPILLARY
Glucose-Capillary: 142 mg/dL — ABNORMAL HIGH (ref 70–99)
Glucose-Capillary: 136 mg/dL — ABNORMAL HIGH (ref 70–99)
Glucose-Capillary: 147 mg/dL — ABNORMAL HIGH (ref 70–99)
Glucose-Capillary: 174 mg/dL — ABNORMAL HIGH (ref 70–99)

## 2024-03-13 LAB — PHOSPHORUS: Phosphorus: 2.7 mg/dL (ref 2.5–4.6)

## 2024-03-13 MED ORDER — MAGNESIUM SULFATE 4 GM/100ML IV SOLN
4.0000 g | Freq: Once | INTRAVENOUS | Status: AC
  Administered 2024-03-13: 4 g via INTRAVENOUS
  Filled 2024-03-13: qty 100

## 2024-03-13 MED ORDER — MAGNESIUM OXIDE -MG SUPPLEMENT 400 (240 MG) MG PO TABS
400.0000 mg | ORAL_TABLET | Freq: Every day | ORAL | Status: DC
  Administered 2024-03-14: 400 mg via ORAL
  Filled 2024-03-13: qty 1

## 2024-03-13 MED ORDER — ACETAMINOPHEN 325 MG PO TABS
650.0000 mg | ORAL_TABLET | Freq: Four times a day (QID) | ORAL | Status: DC | PRN
Start: 2024-03-13 — End: 2024-03-14
  Administered 2024-03-13 – 2024-03-14 (×2): 650 mg via ORAL
  Filled 2024-03-13 (×2): qty 2

## 2024-03-13 NOTE — Progress Notes (Signed)
 PROGRESS NOTE    Shannon Benitez  WUJ:[id number] DOB: 1971-07-28 DOA: 03/10/2024 PCP: Edgar Goods, MD    Brief Narrative:  Patient is 53 year old with history of multiple sclerosis, chronic pain syndrome on extensive medications, prior history of seizure about 20 years ago as per patient who was found at home unresponsive in urine and feces by her son when he came back home from work overnight.  Patient does not remember the events.  She apparently also fell forward and hit on the furniture's.  Was brought to [facility] emergency room where she was bradycardic and hypotensive.  She was given IV fluid, atropine, Levophed .  There was no response to Narcan  so she was intubated and transferred to [facility] ICU. Seen by Neurology on arrival.  5/6, admitted to ICU intubated and started on seizure medicines  5/7 extubated , back on pain regimen. 5/8 to medical floor   Subjective: Patient seen and examined.  Worried about constant diarrhea.  3-4 episodes over the last 24 hours.  Apparently on a scheduled MiraLAX . Denies any nausea vomiting.  Eating regular diet.  She thinks she can walk with a walker. Wants to go home for Mother's Day. Mobilize today, monitor diarrhea and possible home tomorrow   Assessment & Plan:   Acute metabolic encephalopathy and unresponsiveness: Likely polypharmacy and suspected seizure disorder. EEG and continuous EEG currently without any evidence of seizure. With h/o seizure , patient on Lamictal  and Keppra  that is resumed. Neurology following. Mental status has improved.  Multiple sclerosis, chronic pain syndrome, multiple medications with risk of polypharmacy: Patient on baclofen , Lamictal , Keppra , oxycodone , primidone , Zoloft  and propranolol . Very high risk of polypharmacy. Resumed with the lower dose of baclofen  10 mg 3 times daily, dose reduced Continued on Lamictal .  Continued on Lyrica  50 mg twice daily.  Primidone  50 mg twice daily.  Propranolol  40 mg twice  daily.  Zoloft  50 mg daily, dose reduced from 100 mg daily.  Traumatic rhabdomyolysis: CK was more than thousand.  Treated with IV fluids.  Improving.  276 today.  Acute respiratory insufficiency secondary to encephalopathy: Improved.  On room air now.  Hypomagnesemia: Persistently low.  Replace aggressively today.  Will also keep on oral magnesium  oxide.  Overactive bladder: Restart Myrbetriq .  Type 2 diabetes: Well-controlled.  On metformin and Januvia at home.  Currently on sliding scale insulin .  Hyperlipidemia: On statin.  Holding until CK levels improved.  Depression: On Zoloft .  On reduced dose.  GERD: On PPI.  Physical debility: Continue to work with PT OT.  Anticipate home with home health PT OT.  DVT prophylaxis: heparin  injection 5,000 Units Start: 03/11/24 0600 SCDs Start: 03/10/24 1649   Code Status: Full code Family Communication: Son called and updated 5/8. Disposition Plan: Status is: Inpatient Remains inpatient appropriate because: Debility, ongoing diarrhea     Consultants:  Neurology Critical care  Procedures:  Long-term EEG  Antimicrobials:  None     Objective: Vitals:   03/12/24 2003 03/12/24 2343 03/13/24 0416 03/13/24 0834  BP: (!) 145/79 (!) 150/77 (!) 165/73 (!) 157/90  Pulse: 84 74 69 65  Resp: 20 14 18 16   Temp: 98.6 F (37 C) 98.4 F (36.9 C) 98.3 F (36.8 C) 98.2 F (36.8 C)  TempSrc: Oral Oral Oral Oral  SpO2: 94% 94% 95% 96%  Weight:      Height:        Intake/Output Summary (Last 24 hours) at 03/13/2024 1123 Last data filed at 03/13/2024 0919 Gross per 24 hour  Intake 240 ml  Output --  Net 240 ml   Filed Weights   03/10/24 1716 03/11/24 0500  Weight: 96.6 kg 94.5 kg    Examination:  General exam: Appears calm and comfortable.  Frail.  Pleasant and interactive today. Respiratory system: Clear to auscultation. Respiratory effort normal.  No added sounds. Cardiovascular system: S1 & S2 heard, RRR. No pedal  edema. Gastrointestinal system: Soft and nontender.  Bowel sound present. Central nervous system: Alert and oriented. No focal neurological deficits.  Gross generalized weakness.     Data Reviewed: I have personally reviewed following labs and imaging studies  CBC: Recent Labs  Lab 03/10/24 1044 03/10/24 1904 03/11/24 0857 03/13/24 0824  WBC 5.5 7.5 6.9 5.2  NEUTROABS 4.2 6.3  --  3.9  HGB 13.5 13.1 13.3 13.6  HCT 42.2 38.9 39.8 40.3  MCV 89.8 88.0 86.5 87.8  PLT 178 171 206 236   Basic Metabolic Panel: Recent Labs  Lab 03/10/24 1044 03/10/24 1904 03/11/24 0857 03/13/24 0824  NA 137 138 143 141  K 2.9* 2.5* 3.2* 3.5  CL 102 106 105 100  CO2 25 21* 25 28  GLUCOSE 101* 115* 133* 134*  BUN 13 10 5* 6  CREATININE 1.32* 1.08* 0.92 0.55  CALCIUM 9.2 8.5* 9.1 9.2  MG 1.7 1.3* 2.2 1.4*  PHOS 4.3 3.7  --  2.7   GFR: Estimated Creatinine Clearance: 88.8 mL/min (by C-G formula based on SCr of 0.55 mg/dL). Liver Function Tests: Recent Labs  Lab 03/10/24 1044 03/10/24 1904 03/11/24 0857 03/13/24 0824  AST 31 32 27 19  ALT 14 14 15 14   ALKPHOS 65 61 74 71  BILITOT 0.7 0.8 1.0 0.6  PROT 7.1 6.3* 6.7 7.2  ALBUMIN 3.5 3.0* 3.1* 3.2*   No results for input(s): "LIPASE", "AMYLASE" in the last 168 hours. No results for input(s): "AMMONIA" in the last 168 hours. Coagulation Profile: Recent Labs  Lab 03/10/24 1308 03/10/24 1904  INR 1.0 1.1   Cardiac Enzymes: Recent Labs  Lab 03/10/24 1134 03/11/24 0857 03/13/24 0824  CKTOTAL 1,000* 901* 276*   BNP (last 3 results) No results for input(s): "PROBNP" in the last 8760 hours. HbA1C: No results for input(s): "HGBA1C" in the last 72 hours. CBG: Recent Labs  Lab 03/12/24 0623 03/12/24 1135 03/12/24 1558 03/12/24 2111 03/13/24 0643  GLUCAP 141* 163* 157* 155* 142*   Lipid Profile: No results for input(s): "CHOL", "HDL", "LDLCALC", "TRIG", "CHOLHDL", "LDLDIRECT" in the last 72 hours. Thyroid Function  Tests: No results for input(s): "TSH", "T4TOTAL", "FREET4", "T3FREE", "THYROIDAB" in the last 72 hours. Anemia Panel: No results for input(s): "VITAMINB12", "FOLATE", "FERRITIN", "TIBC", "IRON ", "RETICCTPCT" in the last 72 hours. Sepsis Labs: Recent Labs  Lab 03/10/24 1044 03/10/24 1308 03/10/24 1904 03/10/24 2208  LATICACIDVEN 2.1* 1.7 1.0 0.7    Recent Results (from the past 240 hours)  Blood Culture (routine x 2)     Status: None (Preliminary result)   Collection Time: 03/10/24 11:05 AM   Specimen: BLOOD  Result Value Ref Range Status   Specimen Description BLOOD RIGHT ANTECUBITAL  Final   Special Requests   Final    BOTTLES DRAWN AEROBIC AND ANAEROBIC Blood Culture adequate volume   Culture   Final    NO GROWTH 3 DAYS Performed at [facility], [address] [place], [place] [address]    Report Status PENDING  Incomplete  Blood Culture (routine x 2)     Status: None (Preliminary result)  Collection Time: 03/10/24 11:10 AM   Specimen: BLOOD  Result Value Ref Range Status   Specimen Description BLOOD LEFT ANTECUBITAL  Final   Special Requests   Final    BOTTLES DRAWN AEROBIC AND ANAEROBIC Blood Culture results may not be optimal due to an inadequate volume of blood received in culture bottles   Culture   Final    NO GROWTH 3 DAYS Performed at [facility], [address], [place], [place] [address]    Report Status PENDING  Incomplete  Resp panel by RT-PCR (RSV, Flu A&B, Covid) Anterior Nasal Swab     Status: None   Collection Time: 03/10/24 11:26 AM   Specimen: Anterior Nasal Swab  Result Value Ref Range Status   SARS Coronavirus 2 by RT PCR NEGATIVE NEGATIVE Final    Comment: (NOTE) SARS-CoV-2 target nucleic acids are NOT DETECTED.  The SARS-CoV-2 RNA is generally detectable in upper respiratory specimens during the acute phase of infection. The lowest concentration of SARS-CoV-2 viral copies this assay can detect is 138 copies/mL. A  negative result does not preclude SARS-Cov-2 infection and should not be used as the sole basis for treatment or other patient management decisions. A negative result may occur with  improper specimen collection/handling, submission of specimen other than nasopharyngeal swab, presence of viral mutation(s) within the areas targeted by this assay, and inadequate number of viral copies(<138 copies/mL). A negative result must be combined with clinical observations, patient history, and epidemiological information. The expected result is Negative.  Fact Sheet for Patients:  BloggerCourse.com  Fact Sheet for Healthcare Providers:  SeriousBroker.it  This test is no t yet approved or cleared by the [place]  FDA and  has been authorized for detection and/or diagnosis of SARS-CoV-2 by FDA under an Emergency Use Authorization (EUA). This EUA will remain  in effect (meaning this test can be used) for the duration of the COVID-19 declaration under Section 564(b)(1) of the Act, 21 U.S.C.section 360bbb-3(b)(1), unless the authorization is terminated  or revoked sooner.       Influenza A by PCR NEGATIVE NEGATIVE Final   Influenza B by PCR NEGATIVE NEGATIVE Final    Comment: (NOTE) The Xpert Xpress SARS-CoV-2/FLU/RSV plus assay is intended as an aid in the diagnosis of influenza from Nasopharyngeal swab specimens and should not be used as a sole basis for treatment. Nasal washings and aspirates are unacceptable for Xpert Xpress SARS-CoV-2/FLU/RSV testing.  Fact Sheet for Patients: BloggerCourse.com  Fact Sheet for Healthcare Providers: SeriousBroker.it  This test is not yet approved or cleared by the [place]  FDA and has been authorized for detection and/or diagnosis of SARS-CoV-2 by FDA under an Emergency Use Authorization (EUA). This EUA will remain in effect (meaning this test can  be used) for the duration of the COVID-19 declaration under Section 564(b)(1) of the Act, 21 U.S.C. section 360bbb-3(b)(1), unless the authorization is terminated or revoked.     Resp Syncytial Virus by PCR NEGATIVE NEGATIVE Final    Comment: (NOTE) Fact Sheet for Patients: BloggerCourse.com  Fact Sheet for Healthcare Providers: SeriousBroker.it  This test is not yet approved or cleared by the [place]  FDA and has been authorized for detection and/or diagnosis of SARS-CoV-2 by FDA under an Emergency Use Authorization (EUA). This EUA will remain in effect (meaning this test can be used) for the duration of the COVID-19 declaration under Section 564(b)(1) of the Act, 21 U.S.C. section 360bbb-3(b)(1), unless the authorization is terminated or revoked.  Performed at [facility]  Lab, [address] [place], [place] [address]   MRSA Next Gen by PCR, Nasal     Status: None   Collection Time: 03/10/24  4:50 PM   Specimen: Nasal Mucosa; Nasal Swab  Result Value Ref Range Status   MRSA by PCR Next Gen NOT DETECTED NOT DETECTED Final    Comment: (NOTE) The GeneXpert MRSA Assay (FDA approved for NASAL specimens only), is one component of a comprehensive MRSA colonization surveillance program. It is not intended to diagnose MRSA infection nor to guide or monitor treatment for MRSA infections. Test performance is not FDA approved in patients less than 68 years old. Performed at [facility], [address], [place], [place] [address]          Radiology Studies: No results found.       Scheduled Meds:  baclofen   10 mg Oral TID   Chlorhexidine  Gluconate Cloth  6 each Topical Daily   heparin   5,000 Units Subcutaneous Q8H   insulin  aspart  0-9 Units Subcutaneous TID WC   lamoTRIgine   50 mg Oral BID   levETIRAcetam   500 mg Oral BID   [START ON 03/14/2024] magnesium  oxide  400 mg Oral Daily   mirabegron  ER  50  mg Oral QPM   pantoprazole   40 mg Oral Daily   pregabalin   50 mg Oral BID   primidone   50 mg Oral BID   propranolol   40 mg Oral BID   sertraline   50 mg Oral Daily   Continuous Infusions:  magnesium  sulfate bolus IVPB       LOS: 3 days      Vada Garibaldi, MD [facility]

## 2024-03-13 NOTE — Plan of Care (Signed)

## 2024-03-13 NOTE — TOC Transition Note (Signed)
 Transition of Care [facility]) - Discharge Note   Patient Details  Name: Shannon Benitez MRN: [id number] Date of Birth: 1971/02/10  Transition of Care [facility]) CM/SW Contact:  Jonathan Neighbor, RN Phone Number: 03/13/2024, 2:52 PM   Clinical Narrative:     Therapy now recommending HH at home. CM has called all the local HH agencies and none are in network with her insurance. CM has updated the pt and the MD. CM talked over face time in the room with pts neighbor/ friend. She will face time her daily to make sure she is ok. Pt is also setting her medications up at [facility] through her pharmacy so that she doesn't make any mistakes.  Son will transport home.   Final next level of care: Home/Self Care Barriers to Discharge: No Barriers Identified   Patient Goals and CMS Choice            Discharge Placement                       Discharge Plan and Services Additional resources added to the After Visit Summary for     Discharge Planning Services: CM Consult Post Acute Care Choice: IP Rehab                               Social Drivers of Health (SDOH) Interventions SDOH Screenings   Food Insecurity: Food Insecurity Present (03/11/2024)  Housing: Low Risk  (03/11/2024)  Transportation Needs: No Transportation Needs (03/11/2024)  Utilities: At Risk (03/11/2024)  Depression (PHQ2-9): Low Risk  (02/12/2024)  Tobacco Use: Medium Risk (03/11/2024)  Health Literacy: Low Risk  (02/13/2021)   Received from [facility]     Readmission Risk Interventions     No data to display

## 2024-03-13 NOTE — Progress Notes (Signed)
 Physical Therapy Treatment Patient Details Name: Shannon Benitez MRN: [id number] DOB: [birth year] Today's Date: 03/13/2024   History of Present Illness Pt is a 53 y.o. female who presented 03/10/24 after being found unresponsive. Intubated & extubated 5/6.Admitted with seizure vs acute encephalopathy with potential polypharmacy contribution. PMH: MS, seizures, DM, GERD, vertigo, OA    PT Comments  Patient is agreeable to PT session and cooperative throughout. Encouraged continued use of rolling walker for safety and fall prevention with upright activity. Patient ambulating into the hallway with the walker with only supervision for safety. Anticipate the patient could return home with family support. She is very eager to be discharged home and does not seem interested in other therapy options at this time. PT will continue to follow.    If plan is discharge home, recommend the following: Assist for transportation;Supervision due to cognitive status;A little help with walking and/or transfers;Assistance with cooking/housework;Help with stairs or ramp for entrance   Can travel by private vehicle        Equipment Recommendations  None recommended by PT    Recommendations for Other Services       Precautions / Restrictions Precautions Precautions: Fall Recall of Precautions/Restrictions: Impaired Restrictions Weight Bearing Restrictions Per Provider Order: No     Mobility  Bed Mobility Overal bed mobility: Needs Assistance Bed Mobility: Sit to Supine       Sit to supine: Supervision        Transfers Overall transfer level: Needs assistance Equipment used: None Transfers: Sit to/from Stand Sit to Stand: Supervision                Ambulation/Gait Ambulation/Gait assistance: Supervision Gait Distance (Feet): 70 Feet Assistive device: Rolling walker (2 wheels) Gait Pattern/deviations: Step-through pattern Gait velocity: decreased     General Gait Details:  occasional cues to stand closer to rolling walker for support with walking. activity tolerance is limited by fatigue and patient requesting not to walk further at this time.   Stairs             Wheelchair Mobility     Tilt Bed    Modified Rankin (Stroke Patients Only)       Balance Overall balance assessment: Needs assistance Sitting-balance support: No upper extremity supported, Feet supported Sitting balance-Leahy Scale: Good     Standing balance support: Bilateral upper extremity supported, During functional activity, No upper extremity supported, Single extremity supported Standing balance-Leahy Scale: Fair Standing balance comment: no overt LOB                            Communication Communication Communication: No apparent difficulties  Cognition Arousal: Alert Behavior During Therapy: WFL for tasks assessed/performed   PT - Cognitive impairments: Safety/Judgement                         Following commands: Impaired Following commands impaired: Only follows one step commands consistently    Cueing Cueing Techniques: Verbal cues, Tactile cues  Exercises      General Comments General comments (skin integrity, edema, etc.): on arrival to the room, patient furniture walking from bed to sink. encouraged patient to use rolling walker for safety and fall prevention      Pertinent Vitals/Pain Pain Assessment Pain Assessment: No/denies pain    Home Living  Prior Function            PT Goals (current goals can now be found in the care plan section) Acute Rehab PT Goals Patient Stated Goal: to go home PT Goal Formulation: With patient Time For Goal Achievement: 03/25/24 Potential to Achieve Goals: Good Progress towards PT goals: Progressing toward goals    Frequency    Min 3X/week      PT Plan      Co-evaluation              AM-PAC PT "6 Clicks" Mobility   Outcome Measure   Help needed turning from your back to your side while in a flat bed without using bedrails?: A Little Help needed moving from lying on your back to sitting on the side of a flat bed without using bedrails?: A Little Help needed moving to and from a bed to a chair (including a wheelchair)?: A Little Help needed standing up from a chair using your arms (e.g., wheelchair or bedside chair)?: A Little Help needed to walk in hospital room?: A Little Help needed climbing 3-5 steps with a railing? : A Little 6 Click Score: 18    End of Session         PT Visit Diagnosis: Unsteadiness on feet (R26.81);Other abnormalities of gait and mobility (R26.89);Muscle weakness (generalized) (M62.81);Difficulty in walking, not elsewhere classified (R26.2);Other symptoms and signs involving the nervous system (R29.898)     Time: 5643-3295 PT Time Calculation (min) (ACUTE ONLY): 13 min  Charges:    $Therapeutic Activity: 8-22 mins PT General Charges $$ ACUTE PT VISIT: 1 Visit                     Ozie Bo, PT, MPT   Erlene Hawks 03/13/2024, 11:59 AM

## 2024-03-14 DIAGNOSIS — R4189 Other symptoms and signs involving cognitive functions and awareness: Secondary | ICD-10-CM | POA: Diagnosis not present

## 2024-03-14 LAB — GLUCOSE, CAPILLARY: Glucose-Capillary: 167 mg/dL — ABNORMAL HIGH (ref 70–99)

## 2024-03-14 MED ORDER — PRAVASTATIN SODIUM 20 MG PO TABS: 20.0000 mg | ORAL_TABLET | Freq: Every day | ORAL | 0 refills | Status: DC

## 2024-03-14 MED ORDER — MAGNESIUM OXIDE -MG SUPPLEMENT 400 (240 MG) MG PO TABS: 400.0000 mg | ORAL_TABLET | Freq: Every day | ORAL | 0 refills | Status: AC

## 2024-03-14 MED ORDER — BACLOFEN 10 MG PO TABS: 10.0000 mg | ORAL_TABLET | Freq: Three times a day (TID) | ORAL | 0 refills | Status: DC

## 2024-03-14 MED ORDER — SERTRALINE HCL 50 MG PO TABS: 50.0000 mg | ORAL_TABLET | Freq: Every day | ORAL | 2 refills | Status: AC

## 2024-03-14 NOTE — Plan of Care (Signed)
  Problem: Clinical Measurements: Goal: Ability to maintain clinical measurements within normal limits will improve Outcome: Progressing Goal: Will remain free from infection Outcome: Progressing Goal: Diagnostic test results will improve Outcome: Progressing Goal: Respiratory complications will improve Outcome: Progressing Goal: Cardiovascular complication will be avoided Outcome: Progressing   Problem: Health Behavior/Discharge Planning: Goal: Ability to manage health-related needs will improve Outcome: Progressing   Problem: Activity: Goal: Risk for activity intolerance will decrease Outcome: Progressing   Problem: Nutrition: Goal: Adequate nutrition will be maintained Outcome: Progressing   Problem: Elimination: Goal: Will not experience complications related to bowel motility Outcome: Progressing Goal: Will not experience complications related to urinary retention Outcome: Progressing   Problem: Pain Managment: Goal: General experience of comfort will improve and/or be controlled Outcome: Progressing   Problem: Safety: Goal: Ability to remain free from injury will improve Outcome: Progressing   Problem: Skin Integrity: Goal: Risk for impaired skin integrity will decrease Outcome: Progressing

## 2024-03-14 NOTE — Progress Notes (Signed)
 Discharge instructions (including medications) discussed with and copy provided to patient. Patient given opportunity to ask questions. Questions clarified.

## 2024-03-14 NOTE — Discharge Summary (Signed)
 Physician Discharge Summary  Ferran Cenci WUJ:[id number] DOB: 28-May-1971 DOA: 03/10/2024  PCP: Edgar Goods, MD  Admit date: 03/10/2024 Discharge date: 03/14/2024  Admitted From: Home Disposition: Home with home health  Recommendations for Outpatient Follow-up:  Follow up with PCP in 1-2 weeks Please obtain BMP/CBC/magnesium /phosphorus in one week   Home Health: PT Equipment/Devices: Already available at home  Discharge Condition: Fair CODE STATUS: Full code Diet recommendation: Low-salt diet  Discharge summary: Patient is 53 year old with history of multiple sclerosis, chronic pain syndrome on extensive medications, prior history of seizure about 20 years ago as per patient who was found at home unresponsive in urine and feces by her son when he came back home from work overnight.  Patient does not remember the events.  She apparently also fell forward and hit on the furniture.  Was brought to [facility] emergency room where she was bradycardic and hypotensive.  She was given IV fluid, atropine, Levophed .  There was no response to Narcan  so she was intubated and transferred to [facility] ICU. Seen by Neurology on arrival.  5/6, admitted to ICU intubated and started back on seizure medicines  5/7 extubated , back on pain regimen. 5/8 to medical floor and stabilized.  Remain in the hospital due to weakness and debility. Going home today with family.  # Acute metabolic encephalopathy and unresponsiveness: Likely polypharmacy and suspected seizure disorder. EEG and continuous EEG without any evidence of seizure. With h/o seizure , patient on Lamictal  and Keppra  that is resumed. Neurology has followed up patient in the hospital. Mental status has improved and normalized.   # Multiple sclerosis, chronic pain syndrome, multiple medications with risk of polypharmacy: Patient on baclofen , Lamictal , Keppra , oxycodone , primidone , Zoloft  and propranolol . Very high risk of polypharmacy. Resumed  with the lower dose of baclofen  10 mg 3 times daily, dose reduced and prescribed. Continued on Lamictal .  Continued on Lyrica  50 mg twice daily.  Primidone  50 mg twice daily.  Propranolol  40 mg twice daily.  Zoloft  50 mg daily, dose reduced from 100 mg daily.  Prescribed. Discussed with patient and family to keep strict medication schedule.   Traumatic rhabdomyolysis: CK was more than thousand.  Normalized.  Resume statins.   Acute respiratory insufficiency secondary to encephalopathy: Improved.  On room air now.   Hypomagnesemia: Persistently low.  Replaced.  Discharging on oral replacement.   Overactive bladder: Myrbetriq .   Type 2 diabetes: Well-controlled.  On metformin and Januvia at home.  Resume on discharge.   Hyperlipidemia: On statin.  Resume today.   Depression: On Zoloft .  On reduced dose.   GERD: On PPI.   Physical debility: Recommended home with home health PT OT.   Stable for discharge home with family today.   Discharge Diagnoses:  Principal Problem:   Unresponsive    Discharge Instructions  Discharge Instructions     Diet Carb Modified   Complete by: As directed    Increase activity slowly   Complete by: As directed       Allergies as of 03/14/2024       Reactions   5-alpha Reductase Inhibitors    Desvenlafaxine    Allergic reaction   Glatiramer Other (See Comments)   Pt states me gave her cold spells.   Rebif [interferon Beta-1a] Other (See Comments)   The skin around the injection site turned black   Gramineae Pollens Itching   Latex Itching   Only when gloves are worn by pt   Tramadol  Diarrhea, Nausea And Vomiting, Anxiety  Medication List     STOP taking these medications    Cholecalciferol 1.25 MG (50000 UT) capsule   Dimethyl Fumarate 240 MG Cpdr       TAKE these medications    aspirin-acetaminophen -caffeine 250-250-65 MG tablet Commonly known as: EXCEDRIN MIGRAINE Take by mouth every 6 (six) hours as needed for  headache.   baclofen  10 MG tablet Commonly known as: LIORESAL  Take 1 tablet (10 mg total) by mouth 3 (three) times daily. What changed:  medication strength how much to take when to take this   lamoTRIgine  25 MG tablet Commonly known as: LAMICTAL  Take 50 mg by mouth 2 (two) times daily.   levETIRAcetam  250 MG tablet Commonly known as: KEPPRA  Take 500 mg by mouth 2 (two) times daily.   loperamide  2 MG tablet Commonly known as: IMODIUM  A-D Take 2 mg by mouth as needed for diarrhea or loose stools.   magnesium  oxide 400 (240 Mg) MG tablet Commonly known as: MAG-OX Take 1 tablet (400 mg total) by mouth daily. Start taking on: Mar 15, 2024   meloxicam  15 MG tablet Commonly known as: MOBIC  Take 1 tablet by mouth daily.   metFORMIN 500 MG 24 hr tablet Commonly known as: GLUCOPHAGE-XR Take 1,000 mg by mouth daily with breakfast.   mirabegron  ER 50 MG Tb24 tablet Commonly known as: MYRBETRIQ  Take 50 mg by mouth at bedtime.   naloxone  4 MG/0.1ML Liqd nasal spray kit Commonly known as: NARCAN  Place 1 spray into the nose as needed for up to 365 doses (for opioid-induced respiratory depresssion). In case of emergency (overdose), spray once into each nostril. If no response within 3 minutes, repeat application and call 911.   omeprazole 40 MG capsule Commonly known as: PRILOSEC Take 40 mg by mouth daily.   oxyCODONE  5 MG immediate release tablet Commonly known as: Oxy IR/ROXICODONE  Take 1 tablet (5 mg total) by mouth every 6 (six) hours as needed for severe pain (pain score 7-10). Must last 30 days.   oxyCODONE  5 MG immediate release tablet Commonly known as: Oxy IR/ROXICODONE  Take 1 tablet (5 mg total) by mouth every 6 (six) hours as needed for severe pain (pain score 7-10). Must last 30 days. Start taking on: Mar 18, 2024   oxyCODONE  5 MG immediate release tablet Commonly known as: Oxy IR/ROXICODONE  Take 1 tablet (5 mg total) by mouth every 6 (six) hours as needed for  severe pain (pain score 7-10). Must last 30 days. Start taking on: April 17, 2024   pravastatin 20 MG tablet Commonly known as: PRAVACHOL Take 1 tablet (20 mg total) by mouth daily.   pregabalin  50 MG capsule Commonly known as: LYRICA  Take 50 mg by mouth See admin instructions.   primidone  50 MG tablet Commonly known as: MYSOLINE  Take 100 mg by mouth 2 (two) times daily.   propranolol  40 MG tablet Commonly known as: INDERAL  Take by mouth.   sertraline  50 MG tablet Commonly known as: ZOLOFT  Take 1 tablet (50 mg total) by mouth daily. What changed:  medication strength how much to take   sitaGLIPtin 100 MG tablet Commonly known as: JANUVIA Take 1 tablet by mouth daily.        Allergies  Allergen Reactions   5-Alpha Reductase Inhibitors    Desvenlafaxine     Allergic reaction   Glatiramer Other (See Comments)    Pt states me gave her cold spells.    Rebif [Interferon Beta-1a] Other (See Comments)    The skin around the  injection site turned black   Gramineae Pollens Itching   Latex Itching    Only when gloves are worn by pt   Tramadol  Diarrhea, Nausea And Vomiting and Anxiety    Consultations: Neurology Critical care   Procedures/Studies: ECHOCARDIOGRAM COMPLETE Result Date: 03/11/2024    ECHOCARDIOGRAM REPORT   Patient Name:   AMNERIS GRIVAS Date of Exam: 03/11/2024 Medical Rec #:  [id number]          Height:       63.0 in Accession #:    [id number]         Weight:       208.3 lb Date of Birth:  November 06, 1970          BSA:          1.967 m Patient Age:    52 years           BP:           141/76 mmHg Patient Gender: F                  HR:           70 bpm. Exam Location:  Inpatient Procedure: 2D Echo, Color Doppler, Cardiac Doppler and Intracardiac            Opacification Agent (Both Spectral and Color Flow Doppler were            utilized during procedure). Indications:    "Unresponsiveness"  History:        Patient has prior history of Echocardiogram examinations.  Risk                 Factors:Diabetes.  Sonographer:    Sherline Distel Senior RDCS Referring Phys: [phone] STEPHANIE M REESE IMPRESSIONS  1. Left ventricular ejection fraction, by estimation, is 60 to 65%. The left ventricle has normal function. The left ventricle has no regional wall motion abnormalities. Left ventricular diastolic parameters are consistent with Grade I diastolic dysfunction (impaired relaxation).  2. Right ventricular systolic function is normal. The right ventricular size is normal.  3. The mitral valve is normal in structure. No evidence of mitral valve regurgitation. No evidence of mitral stenosis.  4. The aortic valve is normal in structure. Aortic valve regurgitation is not visualized. No aortic stenosis is present.  5. The inferior vena cava is normal in size with greater than 50% respiratory variability, suggesting right atrial pressure of 3 mmHg. FINDINGS  Left Ventricle: Left ventricular ejection fraction, by estimation, is 60 to 65%. The left ventricle has normal function. The left ventricle has no regional wall motion abnormalities. Definity  contrast agent was given IV to delineate the left ventricular  endocardial borders. The left ventricular internal cavity size was normal in size. There is no left ventricular hypertrophy. Left ventricular diastolic parameters are consistent with Grade I diastolic dysfunction (impaired relaxation). Right Ventricle: The right ventricular size is normal. No increase in right ventricular wall thickness. Right ventricular systolic function is normal. Left Atrium: Left atrial size was normal in size. Right Atrium: Right atrial size was normal in size. Pericardium: There is no evidence of pericardial effusion. Mitral Valve: The mitral valve is normal in structure. No evidence of mitral valve regurgitation. No evidence of mitral valve stenosis. Tricuspid Valve: The tricuspid valve is normal in structure. Tricuspid valve regurgitation is not demonstrated. No evidence  of tricuspid stenosis. Aortic Valve: The aortic valve is normal in structure. Aortic valve regurgitation is not visualized. No aortic stenosis is present. Pulmonic  Valve: The pulmonic valve was normal in structure. Pulmonic valve regurgitation is not visualized. No evidence of pulmonic stenosis. Aorta: The aortic root is normal in size and structure. Venous: The inferior vena cava is normal in size with greater than 50% respiratory variability, suggesting right atrial pressure of 3 mmHg. IAS/Shunts: No atrial level shunt detected by color flow Doppler.  LEFT VENTRICLE PLAX 2D LVIDd:         4.50 cm   Diastology LVIDs:         3.00 cm   LV e' medial:    6.99 cm/s LV PW:         0.90 cm   LV E/e' medial:  10.9 LV IVS:        1.00 cm   LV e' lateral:   8.39 cm/s LVOT diam:     2.20 cm   LV E/e' lateral: 9.1 LV SV:         103 LV SV Index:   52 LVOT Area:     3.80 cm  RIGHT VENTRICLE RV S prime:     12.90 cm/s TAPSE (M-mode): 2.3 cm LEFT ATRIUM             Index        RIGHT ATRIUM           Index LA diam:        4.00 cm 2.03 cm/m   RA Area:     17.40 cm LA Vol (A2C):   49.7 ml 25.26 ml/m  RA Volume:   48.50 ml  24.65 ml/m LA Vol (A4C):   49.8 ml 25.31 ml/m LA Biplane Vol: 49.4 ml 25.11 ml/m  AORTIC VALVE LVOT Vmax:   126.00 cm/s LVOT Vmean:  80.200 cm/s LVOT VTI:    0.270 m  AORTA Ao Root diam: 2.70 cm Ao Asc diam:  2.90 cm MITRAL VALVE MV Area (PHT): 2.93 cm    SHUNTS MV Decel Time: 259 msec    Systemic VTI:  0.27 m MV E velocity: 76.40 cm/s  Systemic Diam: 2.20 cm MV A velocity: 85.20 cm/s MV E/A ratio:  0.90 Dorothye Gathers MD Electronically signed by Dorothye Gathers MD Signature Date/Time: 03/11/2024/2:53:12 PM    Final    Overnight EEG with video Result Date: 03/11/2024 Arleene Lack, MD     03/12/2024  8:44 AM Patient Name: Margrete Pinyan MRN: [id number] Epilepsy Attending: Arleene Lack Referring Physician/Provider: Augustin Leber, MD Duration: 03/11/2024 0447 to 03/12/2024 0447 Patient history: 52  y.o. female with hx of MS and prior seizure who was found down and hypotensive in her house today. EEG to evaluate for seizure. Level of alertness: Awake AEDs during EEG study: None Technical aspects: This EEG study was done with scalp electrodes positioned according to the 10-20 International system of electrode placement. Electrical activity was reviewed with band pass filter of 1-70Hz , sensitivity of 7 uV/mm, display speed of 21mm/sec with a 60Hz  notched filter applied as appropriate. EEG data were recorded continuously and digitally stored.  Video monitoring was available and reviewed as appropriate. Description: The posterior dominant rhythm consists of 8 Hz activity of moderate voltage (25-35 uV) seen predominantly in posterior head regions, symmetric and reactive to eye opening and eye closing. Hyperventilation and photic stimulation were not performed.   Of note, EEG was technically difficult due to near constant chewing artifact. IMPRESSION: This study is within normal limits. No seizures or epileptiform discharges were seen throughout the recording. A normal interictal EEG  does not exclude the diagnosis of epilepsy. Arleene Lack   MR BRAIN W WO CONTRAST Result Date: 03/11/2024 CLINICAL DATA:  Neuro deficit, acute, stroke suspected EXAM: MRI HEAD WITHOUT AND WITH CONTRAST TECHNIQUE: Multiplanar, multiecho pulse sequences of the brain and surrounding structures were obtained without and with intravenous contrast. CONTRAST:  10mL GADAVIST  GADOBUTROL  1 MMOL/ML IV SOLN COMPARISON:  Same day CT head.  MRI head February 01, 2024. FINDINGS: Brain: No acute infarction, hemorrhage, hydrocephalus, extra-axial collection or mass lesion. Similar moderate, age advanced T2/FLAIR hyperintensity in the white matter, compatible with known multiple sclerosis. No new lesions identified. No pathologic enhancement. Vascular: Major arterial flow voids are maintained at the skull base. Skull and upper cervical spine: Normal  marrow signal. Sinuses/Orbits: Clear sinuses.  No acute orbital findings. Other: No sizable mastoid effusions. IMPRESSION: 1. No evidence of acute intracranial abnormality. 2. Similar moderate, age-advanced T2/FLAIR hyperintensity in the white matter, compatible with known multiple sclerosis. No new lesions or enhancing lesions. Electronically Signed   By: Stevenson Elbe M.D.   On: 03/11/2024 01:30   DG Chest Port 1 View Result Date: 03/10/2024 CLINICAL DATA:  ET tube placement EXAM: PORTABLE CHEST 1 VIEW COMPARISON:  03/10/2024 FINDINGS: Endotracheal tube is 4.5 cm above the carina. NG tube is in the stomach. Heart and mediastinal contours within normal limits. No confluent opacities, effusions or edema. No acute bony abnormality. IMPRESSION: Endotracheal tube 4.5 cm above the carina. No acute cardiopulmonary disease. Electronically Signed   By: Janeece Mechanic M.D.   On: 03/10/2024 17:15   CT Head Wo Contrast Result Date: 03/10/2024 CLINICAL DATA:  Found unresponsive. Fall with trauma to the head and neck. History of multiple sclerosis. EXAM: CT HEAD WITHOUT CONTRAST CT CERVICAL SPINE WITHOUT CONTRAST TECHNIQUE: Multidetector CT imaging of the head and cervical spine was performed following the standard protocol without intravenous contrast. Multiplanar CT image reconstructions of the cervical spine were also generated. RADIATION DOSE REDUCTION: This exam was performed according to the departmental dose-optimization program which includes automated exposure control, adjustment of the mA and/or kV according to patient size and/or use of iterative reconstruction technique. COMPARISON:  MRI 02/01/2024 FINDINGS: CT HEAD FINDINGS Brain: Mild generalized brain volume loss. Chronic white matter lesions primarily of the deep white matter consistent with the patient's chronic demyelinating disease. No sign of acute brain lesion, mass, hemorrhage, hydrocephalus or extra-axial collection. Vascular: No abnormal vascular  finding. Skull: Normal Sinuses/Orbits: Clear/normal Other: none CT CERVICAL SPINE FINDINGS Alignment: No malalignment. Skull base and vertebrae: No fracture or focal bone lesion. Soft tissues and spinal canal: No traumatic soft tissue finding. Endotracheal intubation. Disc levels: No disc level pathology. No degenerative change. No stenosis of the canal or foramina. No facet arthropathy. Upper chest: Mild dependent pulmonary atelectasis. Other: None IMPRESSION: HEAD CT: No acute or traumatic finding. Chronic white matter lesions consistent with the patient's chronic demyelinating disease. CERVICAL SPINE CT: No acute or traumatic finding.  Normal CT appearance. Electronically Signed   By: Bettylou Brunner M.D.   On: 03/10/2024 11:57   CT Cervical Spine Wo Contrast Result Date: 03/10/2024 CLINICAL DATA:  Found unresponsive. Fall with trauma to the head and neck. History of multiple sclerosis. EXAM: CT HEAD WITHOUT CONTRAST CT CERVICAL SPINE WITHOUT CONTRAST TECHNIQUE: Multidetector CT imaging of the head and cervical spine was performed following the standard protocol without intravenous contrast. Multiplanar CT image reconstructions of the cervical spine were also generated. RADIATION DOSE REDUCTION: This exam was performed according to the departmental  dose-optimization program which includes automated exposure control, adjustment of the mA and/or kV according to patient size and/or use of iterative reconstruction technique. COMPARISON:  MRI 02/01/2024 FINDINGS: CT HEAD FINDINGS Brain: Mild generalized brain volume loss. Chronic white matter lesions primarily of the deep white matter consistent with the patient's chronic demyelinating disease. No sign of acute brain lesion, mass, hemorrhage, hydrocephalus or extra-axial collection. Vascular: No abnormal vascular finding. Skull: Normal Sinuses/Orbits: Clear/normal Other: none CT CERVICAL SPINE FINDINGS Alignment: No malalignment. Skull base and vertebrae: No fracture  or focal bone lesion. Soft tissues and spinal canal: No traumatic soft tissue finding. Endotracheal intubation. Disc levels: No disc level pathology. No degenerative change. No stenosis of the canal or foramina. No facet arthropathy. Upper chest: Mild dependent pulmonary atelectasis. Other: None IMPRESSION: HEAD CT: No acute or traumatic finding. Chronic white matter lesions consistent with the patient's chronic demyelinating disease. CERVICAL SPINE CT: No acute or traumatic finding.  Normal CT appearance. Electronically Signed   By: Bettylou Brunner M.D.   On: 03/10/2024 11:57   DG Chest Port 1 View Result Date: 03/10/2024 CLINICAL DATA:  Unwitnessed fall.  Unresponsive. EXAM: PORTABLE CHEST 1 VIEW COMPARISON:  September 25, 2019. FINDINGS: Mild cardiomegaly is noted. Endotracheal and nasogastric tubes are in grossly good position. Lungs are clear. Bony thorax is unremarkable. IMPRESSION: Support apparatus as noted above.  No acute abnormality seen. Electronically Signed   By: Rosalene Colon M.D.   On: 03/10/2024 11:47   (Echo, Carotid, EGD, Colonoscopy, ERCP)    Subjective: Patient seen and examined.  Eager to go home.  She tells me she is ready to go home and does not need to stay in the hospital for Mother's Day.  Denies any overnight events.  Diarrhea improved.   Discharge Exam: Vitals:   03/14/24 0424 03/14/24 0752  BP: (!) 145/62 (!) 152/75  Pulse: 66 65  Resp:  17  Temp: 98.4 F (36.9 C) 98.7 F (37.1 C)  SpO2: 93% 93%   Vitals:   03/13/24 2045 03/14/24 0039 03/14/24 0424 03/14/24 0752  BP: 136/70 (!) 151/67 (!) 145/62 (!) 152/75  Pulse: 81 73 66 65  Resp:    17  Temp: 98.3 F (36.8 C) 98.7 F (37.1 C) 98.4 F (36.9 C) 98.7 F (37.1 C)  TempSrc: Oral Oral Oral Oral  SpO2: 95% 92% 93% 93%  Weight:      Height:        General: Pt is alert, awake, not in acute distress Frail.  Chronically sick looking but pleasant and interactive today. Cardiovascular: RRR, S1/S2 +, no rubs,  no gallops Respiratory: CTA bilaterally, no wheezing, no rhonchi Abdominal: Soft, NT, ND, bowel sounds + Extremities: no edema, no cyanosis    The results of significant diagnostics from this hospitalization (including imaging, microbiology, ancillary and laboratory) are listed below for reference.     Microbiology: Recent Results (from the past 240 hours)  Blood Culture (routine x 2)     Status: None (Preliminary result)   Collection Time: 03/10/24 11:05 AM   Specimen: BLOOD  Result Value Ref Range Status   Specimen Description BLOOD RIGHT ANTECUBITAL  Final   Special Requests   Final    BOTTLES DRAWN AEROBIC AND ANAEROBIC Blood Culture adequate volume   Culture   Final    NO GROWTH 4 DAYS Performed at [facility], [address], [place], [place] [address]    Report Status PENDING  Incomplete  Blood Culture (routine x 2)  Status: None (Preliminary result)   Collection Time: 03/10/24 11:10 AM   Specimen: BLOOD  Result Value Ref Range Status   Specimen Description BLOOD LEFT ANTECUBITAL  Final   Special Requests   Final    BOTTLES DRAWN AEROBIC AND ANAEROBIC Blood Culture results may not be optimal due to an inadequate volume of blood received in culture bottles   Culture   Final    NO GROWTH 4 DAYS Performed at [facility], [address], [place], [place] [address]    Report Status PENDING  Incomplete  Resp panel by RT-PCR (RSV, Flu A&B, Covid) Anterior Nasal Swab     Status: None   Collection Time: 03/10/24 11:26 AM   Specimen: Anterior Nasal Swab  Result Value Ref Range Status   SARS Coronavirus 2 by RT PCR NEGATIVE NEGATIVE Final    Comment: (NOTE) SARS-CoV-2 target nucleic acids are NOT DETECTED.  The SARS-CoV-2 RNA is generally detectable in upper respiratory specimens during the acute phase of infection. The lowest concentration of SARS-CoV-2 viral copies this assay can detect is 138 copies/mL. A negative result does not preclude  SARS-Cov-2 infection and should not be used as the sole basis for treatment or other patient management decisions. A negative result may occur with  improper specimen collection/handling, submission of specimen other than nasopharyngeal swab, presence of viral mutation(s) within the areas targeted by this assay, and inadequate number of viral copies(<138 copies/mL). A negative result must be combined with clinical observations, patient history, and epidemiological information. The expected result is Negative.  Fact Sheet for Patients:  BloggerCourse.com  Fact Sheet for Healthcare Providers:  SeriousBroker.it  This test is no t yet approved or cleared by the [place]  FDA and  has been authorized for detection and/or diagnosis of SARS-CoV-2 by FDA under an Emergency Use Authorization (EUA). This EUA will remain  in effect (meaning this test can be used) for the duration of the COVID-19 declaration under Section 564(b)(1) of the Act, 21 U.S.C.section 360bbb-3(b)(1), unless the authorization is terminated  or revoked sooner.       Influenza A by PCR NEGATIVE NEGATIVE Final   Influenza B by PCR NEGATIVE NEGATIVE Final    Comment: (NOTE) The Xpert Xpress SARS-CoV-2/FLU/RSV plus assay is intended as an aid in the diagnosis of influenza from Nasopharyngeal swab specimens and should not be used as a sole basis for treatment. Nasal washings and aspirates are unacceptable for Xpert Xpress SARS-CoV-2/FLU/RSV testing.  Fact Sheet for Patients: BloggerCourse.com  Fact Sheet for Healthcare Providers: SeriousBroker.it  This test is not yet approved or cleared by the [place]  FDA and has been authorized for detection and/or diagnosis of SARS-CoV-2 by FDA under an Emergency Use Authorization (EUA). This EUA will remain in effect (meaning this test can be used) for the duration of  the COVID-19 declaration under Section 564(b)(1) of the Act, 21 U.S.C. section 360bbb-3(b)(1), unless the authorization is terminated or revoked.     Resp Syncytial Virus by PCR NEGATIVE NEGATIVE Final    Comment: (NOTE) Fact Sheet for Patients: BloggerCourse.com  Fact Sheet for Healthcare Providers: SeriousBroker.it  This test is not yet approved or cleared by the [place]  FDA and has been authorized for detection and/or diagnosis of SARS-CoV-2 by FDA under an Emergency Use Authorization (EUA). This EUA will remain in effect (meaning this test can be used) for the duration of the COVID-19 declaration under Section 564(b)(1) of the Act, 21 U.S.C. section 360bbb-3(b)(1), unless the authorization is terminated or  revoked.  Performed at [facility], [address] [place], [place] [address]   MRSA Next Gen by PCR, Nasal     Status: None   Collection Time: 03/10/24  4:50 PM   Specimen: Nasal Mucosa; Nasal Swab  Result Value Ref Range Status   MRSA by PCR Next Gen NOT DETECTED NOT DETECTED Final    Comment: (NOTE) The GeneXpert MRSA Assay (FDA approved for NASAL specimens only), is one component of a comprehensive MRSA colonization surveillance program. It is not intended to diagnose MRSA infection nor to guide or monitor treatment for MRSA infections. Test performance is not FDA approved in patients less than 27 years old. Performed at [facility], [address] [place], [place] [address]      Labs: BNP (last 3 results) No results for input(s): "BNP" in the last 8760 hours. Basic Metabolic Panel: Recent Labs  Lab 03/10/24 1044 03/10/24 1904 03/11/24 0857 03/13/24 0824  NA 137 138 143 141  K 2.9* 2.5* 3.2* 3.5  CL 102 106 105 100  CO2 25 21* 25 28  GLUCOSE 101* 115* 133* 134*  BUN 13 10 5* 6  CREATININE 1.32* 1.08* 0.92 0.55  CALCIUM 9.2 8.5* 9.1 9.2  MG 1.7 1.3* 2.2 1.4*  PHOS 4.3 3.7   --  2.7   Liver Function Tests: Recent Labs  Lab 03/10/24 1044 03/10/24 1904 03/11/24 0857 03/13/24 0824  AST 31 32 27 19  ALT 14 14 15 14   ALKPHOS 65 61 74 71  BILITOT 0.7 0.8 1.0 0.6  PROT 7.1 6.3* 6.7 7.2  ALBUMIN 3.5 3.0* 3.1* 3.2*   No results for input(s): "LIPASE", "AMYLASE" in the last 168 hours. No results for input(s): "AMMONIA" in the last 168 hours. CBC: Recent Labs  Lab 03/10/24 1044 03/10/24 1904 03/11/24 0857 03/13/24 0824  WBC 5.5 7.5 6.9 5.2  NEUTROABS 4.2 6.3  --  3.9  HGB 13.5 13.1 13.3 13.6  HCT 42.2 38.9 39.8 40.3  MCV 89.8 88.0 86.5 87.8  PLT 178 171 206 236   Cardiac Enzymes: Recent Labs  Lab 03/10/24 1134 03/11/24 0857 03/13/24 0824  CKTOTAL 1,000* 901* 276*   BNP: Invalid input(s): "POCBNP" CBG: Recent Labs  Lab 03/13/24 0643 03/13/24 1222 03/13/24 1642 03/13/24 2106 03/14/24 0625  GLUCAP 142* 136* 147* 174* 167*   D-Dimer No results for input(s): "DDIMER" in the last 72 hours. Hgb A1c No results for input(s): "HGBA1C" in the last 72 hours. Lipid Profile No results for input(s): "CHOL", "HDL", "LDLCALC", "TRIG", "CHOLHDL", "LDLDIRECT" in the last 72 hours. Thyroid function studies No results for input(s): "TSH", "T4TOTAL", "T3FREE", "THYROIDAB" in the last 72 hours.  Invalid input(s): "FREET3" Anemia work up No results for input(s): "VITAMINB12", "FOLATE", "FERRITIN", "TIBC", "IRON ", "RETICCTPCT" in the last 72 hours. Urinalysis    Component Value Date/Time   COLORURINE YELLOW (A) 03/10/2024 1126   APPEARANCEUR HAZY (A) 03/10/2024 1126   LABSPEC 1.012 03/10/2024 1126   PHURINE 5.0 03/10/2024 1126   GLUCOSEU NEGATIVE 03/10/2024 1126   HGBUR NEGATIVE 03/10/2024 1126   BILIRUBINUR NEGATIVE 03/10/2024 1126   KETONESUR NEGATIVE 03/10/2024 1126   PROTEINUR NEGATIVE 03/10/2024 1126   NITRITE NEGATIVE 03/10/2024 1126   LEUKOCYTESUR NEGATIVE 03/10/2024 1126   Sepsis Labs Recent Labs  Lab 03/10/24 1044 03/10/24 1904  03/11/24 0857 03/13/24 0824  WBC 5.5 7.5 6.9 5.2   Microbiology Recent Results (from the past 240 hours)  Blood Culture (routine x 2)     Status:  None (Preliminary result)   Collection Time: 03/10/24 11:05 AM   Specimen: BLOOD  Result Value Ref Range Status   Specimen Description BLOOD RIGHT ANTECUBITAL  Final   Special Requests   Final    BOTTLES DRAWN AEROBIC AND ANAEROBIC Blood Culture adequate volume   Culture   Final    NO GROWTH 4 DAYS Performed at [facility], [address], [place], [place] [address]    Report Status PENDING  Incomplete  Blood Culture (routine x 2)     Status: None (Preliminary result)   Collection Time: 03/10/24 11:10 AM   Specimen: BLOOD  Result Value Ref Range Status   Specimen Description BLOOD LEFT ANTECUBITAL  Final   Special Requests   Final    BOTTLES DRAWN AEROBIC AND ANAEROBIC Blood Culture results may not be optimal due to an inadequate volume of blood received in culture bottles   Culture   Final    NO GROWTH 4 DAYS Performed at [facility], [address] [place], [place] [address]    Report Status PENDING  Incomplete  Resp panel by RT-PCR (RSV, Flu A&B, Covid) Anterior Nasal Swab     Status: None   Collection Time: 03/10/24 11:26 AM   Specimen: Anterior Nasal Swab  Result Value Ref Range Status   SARS Coronavirus 2 by RT PCR NEGATIVE NEGATIVE Final    Comment: (NOTE) SARS-CoV-2 target nucleic acids are NOT DETECTED.  The SARS-CoV-2 RNA is generally detectable in upper respiratory specimens during the acute phase of infection. The lowest concentration of SARS-CoV-2 viral copies this assay can detect is 138 copies/mL. A negative result does not preclude SARS-Cov-2 infection and should not be used as the sole basis for treatment or other patient management decisions. A negative result may occur with  improper specimen collection/handling, submission of specimen other than nasopharyngeal swab, presence of viral  mutation(s) within the areas targeted by this assay, and inadequate number of viral copies(<138 copies/mL). A negative result must be combined with clinical observations, patient history, and epidemiological information. The expected result is Negative.  Fact Sheet for Patients:  BloggerCourse.com  Fact Sheet for Healthcare Providers:  SeriousBroker.it  This test is no t yet approved or cleared by the [place]  FDA and  has been authorized for detection and/or diagnosis of SARS-CoV-2 by FDA under an Emergency Use Authorization (EUA). This EUA will remain  in effect (meaning this test can be used) for the duration of the COVID-19 declaration under Section 564(b)(1) of the Act, 21 U.S.C.section 360bbb-3(b)(1), unless the authorization is terminated  or revoked sooner.       Influenza A by PCR NEGATIVE NEGATIVE Final   Influenza B by PCR NEGATIVE NEGATIVE Final    Comment: (NOTE) The Xpert Xpress SARS-CoV-2/FLU/RSV plus assay is intended as an aid in the diagnosis of influenza from Nasopharyngeal swab specimens and should not be used as a sole basis for treatment. Nasal washings and aspirates are unacceptable for Xpert Xpress SARS-CoV-2/FLU/RSV testing.  Fact Sheet for Patients: BloggerCourse.com  Fact Sheet for Healthcare Providers: SeriousBroker.it  This test is not yet approved or cleared by the [place]  FDA and has been authorized for detection and/or diagnosis of SARS-CoV-2 by FDA under an Emergency Use Authorization (EUA). This EUA will remain in effect (meaning this test can be used) for the duration of the COVID-19 declaration under Section 564(b)(1) of the Act, 21 U.S.C. section 360bbb-3(b)(1), unless the authorization is terminated or revoked.     Resp Syncytial  Virus by PCR NEGATIVE NEGATIVE Final    Comment: (NOTE) Fact Sheet for  Patients: BloggerCourse.com  Fact Sheet for Healthcare Providers: SeriousBroker.it  This test is not yet approved or cleared by the [place]  FDA and has been authorized for detection and/or diagnosis of SARS-CoV-2 by FDA under an Emergency Use Authorization (EUA). This EUA will remain in effect (meaning this test can be used) for the duration of the COVID-19 declaration under Section 564(b)(1) of the Act, 21 U.S.C. section 360bbb-3(b)(1), unless the authorization is terminated or revoked.  Performed at [facility], [address] [place], [place] [address]   MRSA Next Gen by PCR, Nasal     Status: None   Collection Time: 03/10/24  4:50 PM   Specimen: Nasal Mucosa; Nasal Swab  Result Value Ref Range Status   MRSA by PCR Next Gen NOT DETECTED NOT DETECTED Final    Comment: (NOTE) The GeneXpert MRSA Assay (FDA approved for NASAL specimens only), is one component of a comprehensive MRSA colonization surveillance program. It is not intended to diagnose MRSA infection nor to guide or monitor treatment for MRSA infections. Test performance is not FDA approved in patients less than 41 years old. Performed at [facility], [address] [place], [place] [address]      Time coordinating discharge:  35 minutes  SIGNED:   Vada Garibaldi, MD  [facility] 03/14/2024, 9:11 AM

## 2024-03-14 NOTE — Plan of Care (Signed)
  Problem: Education: Goal: Knowledge of General Education information will improve Description: Including pain rating scale, medication(s)/side effects and non-pharmacologic comfort measures Outcome: Adequate for Discharge   Problem: Health Behavior/Discharge Planning: Goal: Ability to manage health-related needs will improve Outcome: Adequate for Discharge   Problem: Clinical Measurements: Goal: Ability to maintain clinical measurements within normal limits will improve Outcome: Adequate for Discharge Goal: Will remain free from infection Outcome: Adequate for Discharge Goal: Diagnostic test results will improve Outcome: Adequate for Discharge Goal: Respiratory complications will improve Outcome: Adequate for Discharge Goal: Cardiovascular complication will be avoided Outcome: Adequate for Discharge   Problem: Activity: Goal: Risk for activity intolerance will decrease Outcome: Adequate for Discharge   Problem: Nutrition: Goal: Adequate nutrition will be maintained Outcome: Adequate for Discharge   Problem: Coping: Goal: Level of anxiety will decrease Outcome: Adequate for Discharge   Problem: Elimination: Goal: Will not experience complications related to bowel motility Outcome: Adequate for Discharge Goal: Will not experience complications related to urinary retention Outcome: Adequate for Discharge   Problem: Pain Managment: Goal: General experience of comfort will improve and/or be controlled Outcome: Adequate for Discharge   Problem: Safety: Goal: Ability to remain free from injury will improve Outcome: Adequate for Discharge   Problem: Skin Integrity: Goal: Risk for impaired skin integrity will decrease Outcome: Adequate for Discharge   Problem: Education: Goal: Ability to describe self-care measures that may prevent or decrease complications (Diabetes Survival Skills Education) will improve Outcome: Adequate for Discharge Goal: Individualized Educational  Video(s) Outcome: Adequate for Discharge   Problem: Coping: Goal: Ability to adjust to condition or change in health will improve Outcome: Adequate for Discharge   Problem: Fluid Volume: Goal: Ability to maintain a balanced intake and output will improve Outcome: Adequate for Discharge   Problem: Health Behavior/Discharge Planning: Goal: Ability to identify and utilize available resources and services will improve Outcome: Adequate for Discharge Goal: Ability to manage health-related needs will improve Outcome: Adequate for Discharge   Problem: Metabolic: Goal: Ability to maintain appropriate glucose levels will improve Outcome: Adequate for Discharge   Problem: Nutritional: Goal: Maintenance of adequate nutrition will improve Outcome: Adequate for Discharge Goal: Progress toward achieving an optimal weight will improve Outcome: Adequate for Discharge   Problem: Skin Integrity: Goal: Risk for impaired skin integrity will decrease Outcome: Adequate for Discharge   Problem: Tissue Perfusion: Goal: Adequacy of tissue perfusion will improve Outcome: Adequate for Discharge   Problem: Acute Rehab PT Goals(only PT should resolve) Goal: Pt Will Go Supine/Side To Sit Outcome: Adequate for Discharge Goal: Pt Will Go Sit To Supine/Side Outcome: Adequate for Discharge Goal: Patient Will Transfer Sit To/From Stand Outcome: Adequate for Discharge Goal: Pt Will Transfer Bed To Chair/Chair To Bed Outcome: Adequate for Discharge Goal: Pt Will Ambulate Outcome: Adequate for Discharge Goal: Pt Will Go Up/Down Stairs Outcome: Adequate for Discharge   Problem: Acute Rehab OT Goals (only OT should resolve) Goal: Pt. Will Perform Grooming Outcome: Adequate for Discharge Goal: Pt. Will Perform Lower Body Dressing Outcome: Adequate for Discharge Goal: Pt. Will Transfer To Toilet Outcome: Adequate for Discharge Goal: Pt. Will Perform Toileting-Clothing Manipulation Outcome: Adequate  for Discharge Goal: Pt. Will Perform Tub/Shower Transfer Outcome: Adequate for Discharge Goal: OT Additional ADL Goal #2 Outcome: Adequate for Discharge

## 2024-03-15 LAB — CULTURE, BLOOD (ROUTINE X 2)
Culture: NO GROWTH
Culture: NO GROWTH
Special Requests: ADEQUATE

## 2024-03-24 ENCOUNTER — Telehealth: Payer: Self-pay

## 2024-03-24 NOTE — Telephone Encounter (Signed)
 Telephone call from patient stating that she needs a Prior Authorization on her Oxycodone .

## 2024-03-25 ENCOUNTER — Encounter: Payer: Self-pay | Admitting: Pain Medicine

## 2024-04-01 ENCOUNTER — Inpatient Hospital Stay: Payer: MEDICAID | Attending: Internal Medicine

## 2024-04-01 DIAGNOSIS — Z79899 Other long term (current) drug therapy: Secondary | ICD-10-CM | POA: Insufficient documentation

## 2024-04-01 DIAGNOSIS — D509 Iron deficiency anemia, unspecified: Secondary | ICD-10-CM | POA: Insufficient documentation

## 2024-04-01 DIAGNOSIS — E538 Deficiency of other specified B group vitamins: Secondary | ICD-10-CM | POA: Insufficient documentation

## 2024-04-03 ENCOUNTER — Inpatient Hospital Stay: Payer: MEDICAID

## 2024-04-03 ENCOUNTER — Ambulatory Visit: Payer: MEDICAID

## 2024-04-03 DIAGNOSIS — D509 Iron deficiency anemia, unspecified: Secondary | ICD-10-CM

## 2024-04-03 DIAGNOSIS — Z79899 Other long term (current) drug therapy: Secondary | ICD-10-CM | POA: Diagnosis not present

## 2024-04-03 DIAGNOSIS — E538 Deficiency of other specified B group vitamins: Secondary | ICD-10-CM | POA: Diagnosis not present

## 2024-04-03 MED ORDER — CYANOCOBALAMIN 1000 MCG/ML IJ SOLN
1000.0000 ug | Freq: Once | INTRAMUSCULAR | Status: AC
  Administered 2024-04-03: 1000 ug via INTRAMUSCULAR
  Filled 2024-04-03: qty 1

## 2024-05-01 ENCOUNTER — Inpatient Hospital Stay: Payer: MEDICAID | Attending: Internal Medicine

## 2024-05-01 DIAGNOSIS — E538 Deficiency of other specified B group vitamins: Secondary | ICD-10-CM | POA: Diagnosis not present

## 2024-05-01 DIAGNOSIS — D509 Iron deficiency anemia, unspecified: Secondary | ICD-10-CM | POA: Insufficient documentation

## 2024-05-01 DIAGNOSIS — Z79899 Other long term (current) drug therapy: Secondary | ICD-10-CM | POA: Insufficient documentation

## 2024-05-01 MED ORDER — CYANOCOBALAMIN 1000 MCG/ML IJ SOLN
1000.0000 ug | Freq: Once | INTRAMUSCULAR | Status: AC
  Administered 2024-05-01: 1000 ug via INTRAMUSCULAR
  Filled 2024-05-01: qty 1

## 2024-05-13 ENCOUNTER — Encounter: Payer: Self-pay | Admitting: Nurse Practitioner

## 2024-05-13 ENCOUNTER — Ambulatory Visit: Payer: MEDICAID | Attending: Nurse Practitioner | Admitting: Nurse Practitioner

## 2024-05-13 DIAGNOSIS — M533 Sacrococcygeal disorders, not elsewhere classified: Secondary | ICD-10-CM | POA: Diagnosis present

## 2024-05-13 DIAGNOSIS — Z79899 Other long term (current) drug therapy: Secondary | ICD-10-CM | POA: Insufficient documentation

## 2024-05-13 DIAGNOSIS — G8929 Other chronic pain: Secondary | ICD-10-CM | POA: Diagnosis present

## 2024-05-13 DIAGNOSIS — M25561 Pain in right knee: Secondary | ICD-10-CM | POA: Insufficient documentation

## 2024-05-13 DIAGNOSIS — M25562 Pain in left knee: Secondary | ICD-10-CM | POA: Insufficient documentation

## 2024-05-13 DIAGNOSIS — M25551 Pain in right hip: Secondary | ICD-10-CM | POA: Insufficient documentation

## 2024-05-13 DIAGNOSIS — Z79891 Long term (current) use of opiate analgesic: Secondary | ICD-10-CM | POA: Diagnosis present

## 2024-05-13 DIAGNOSIS — M79605 Pain in left leg: Secondary | ICD-10-CM | POA: Insufficient documentation

## 2024-05-13 DIAGNOSIS — M5459 Other low back pain: Secondary | ICD-10-CM | POA: Insufficient documentation

## 2024-05-13 DIAGNOSIS — G35 Multiple sclerosis: Secondary | ICD-10-CM | POA: Insufficient documentation

## 2024-05-13 DIAGNOSIS — M25552 Pain in left hip: Secondary | ICD-10-CM | POA: Diagnosis present

## 2024-05-13 DIAGNOSIS — M79604 Pain in right leg: Secondary | ICD-10-CM | POA: Insufficient documentation

## 2024-05-13 DIAGNOSIS — M5441 Lumbago with sciatica, right side: Secondary | ICD-10-CM | POA: Insufficient documentation

## 2024-05-13 DIAGNOSIS — M47816 Spondylosis without myelopathy or radiculopathy, lumbar region: Secondary | ICD-10-CM | POA: Diagnosis present

## 2024-05-13 DIAGNOSIS — M5442 Lumbago with sciatica, left side: Secondary | ICD-10-CM | POA: Insufficient documentation

## 2024-05-13 DIAGNOSIS — G894 Chronic pain syndrome: Secondary | ICD-10-CM | POA: Insufficient documentation

## 2024-05-13 MED ORDER — OXYCODONE HCL 5 MG PO TABS: 5.0000 mg | ORAL_TABLET | Freq: Four times a day (QID) | ORAL | 0 refills | Status: DC | PRN

## 2024-05-13 NOTE — Progress Notes (Signed)
 PROVIDER NOTE: Interpretation of information contained herein should be left to medically-trained personnel. Specific patient instructions are provided elsewhere under Patient Instructions section of medical record. This document was created in part using AI and STT-dictation technology, any transcriptional errors that may result from this process are unintentional.  Patient: Shannon Benitez  Service: E/M   PCP: Derenda Rockers, MD  DOB: 1971/01/02  DOS: 05/13/2024  Provider: Emmy MARLA Blanch, NP  MRN: [id number]  Delivery: Face-to-face  Specialty: Interventional Pain Management  Type: Established Patient  Setting: Ambulatory outpatient facility  Specialty designation: 09  Referring Prov.: Derenda Rockers, MD  Location: Outpatient office facility       History of present illness (HPI) Shannon Benitez, a 53 y.o. year old female, is here today because of her No primary diagnosis found.. [name] primary complain today is Back Pain (lower)  Pertinent problems: [name] has Multiple sclerosis (HCC); Neurogenic pain; Chronic sacroiliac joint pain (Bilateral) (L>R); Muscle spasticity; Osteoarthritis of lumbar spine; DDD (degenerative disc disease), lumbosacral; and Chronic pain syndrome on their pertinent problem list  Pain Assessment: Severity of Chronic pain is reported as a 7 /10. Location: Back Lower, Right, Left/buttocks bilateral and to hips bilateral. Onset: More than a month ago. Quality: Aching, Constant, Radiating, Restless, Discomfort, Sore. Timing: Constant. Modifying factor(s): meds, rest. Vitals:  height is 5' (1.524 m) and weight is 190 lb (86.2 kg). Her temporal temperature is 97.2 F (36.2 C) (abnormal). Her blood pressure is 123/77 and her pulse is 69. Her respiration is 16 and oxygen saturation is 99%.  BMI: Estimated body mass index is 37.11 kg/m as calculated from the following:   Height as of this encounter: 5' (1.524 m).   Weight as of this encounter: 190 lb  (86.2 kg).  Last encounter: 0/07/2024 Last procedure: Visit date not found.  Reason for encounter: medication management.  The patient indicates doing well with current medication regimen.  No adverse reaction or side effects reported to medication.  Her prescription are up-to-date and she brought her pills for counting.  Pharmacy remains unchanged.  The patient experiences pain in her lower back that radiated to bilateral legs with left side being worse than the right side.  Pharmacotherapy Assessment   Oxycodone  (oxy IR/oxycodone ) 5 mg immediate release every 6 hours as needed for pain.MME=30 Monitoring: [place] PMP: PDMP reviewed during this encounter.       Pharmacotherapy: No side-effects or adverse reactions reported. Compliance: No problems identified. Effectiveness: Clinically acceptable.  Dorlene Montie FALCON, RN  05/13/2024  1:12 PM  Sign when Signing Visit Nursing Pain Medication Assessment:  Safety precautions to be maintained throughout the outpatient stay will include: orient to surroundings, keep bed in low position, maintain call bell within reach at all times, provide assistance with transfer out of bed and ambulation.  Medication Inspection Compliance: Pill count conducted under aseptic conditions, in front of the patient. Neither the pills nor the bottle was removed from the patient's sight at any time. Once count was completed pills were immediately returned to the patient in their original bottle.  Medication: Oxycodone  IR Pill/Patch Count: 18 of 120 pills/patches remain Pill/Patch Appearance: Markings consistent with prescribed medication Bottle Appearance: Standard pharmacy container. Clearly labeled. Filled Date: 06 / 13 / 2025 Last Medication intake:  Today  UDS:  Summary  Date Value Ref Range Status  12/26/2022 Note  Corrected    Comment:    ==================================================================== [employer] Select 13  (MW) ==================================================================== Test  Result       Flag       Units  Drug Present and Declared for Prescription Verification   Oxycodone                       2390         EXPECTED   ng/mg creat   Noroxycodone                   5988         EXPECTED   ng/mg creat   Noroxymorphone                 61           EXPECTED   ng/mg creat    Sources of oxycodone  include scheduled prescription medications.    Noroxycodone and noroxymorphone are expected metabolites of    oxycodone .    Phenobarbital                   PRESENT      EXPECTED    Phenobarbital  is an expected metabolite of primidone ; Phenobarbital     may also be administered as a prescription drug.  ==================================================================== Test                      Result    Flag   Units      Ref Range   Creatinine              163              mg/dL      >=79 ==================================================================== Declared Medications:  The flagging and interpretation on this report are based on the  following declared medications.  Unexpected results may arise from  inaccuracies in the declared medications.   **Note: The testing scope of this panel includes these medications:   Oxycodone  (OxyIR)  Primidone  (Mysoline )   **Note: The testing scope of this panel does not include the  following reported medications:   Omeprazole (Prilosec)  Pregabalin  (Lyrica )  Propranolol  (Inderal )  Sertraline  (Zoloft )  Sitagliptin (Januvia) ==================================================================== For clinical consultation, please call [phone]. ====================================================================     No results found for: CBDTHCR No results found for: D8THCCBX No results found for: D9THCCBX  ROS  Constitutional: Denies any fever or chills Gastrointestinal: No reported hemesis,  hematochezia, vomiting, or acute GI distress Musculoskeletal: Lower back pain Neurological: No reported episodes of acute onset apraxia, aphasia, dysarthria, agnosia, amnesia, paralysis, loss of coordination, or loss of consciousness  Medication Review  aspirin-acetaminophen -caffeine, baclofen , lamoTRIgine , levETIRAcetam , loperamide , magnesium  oxide, meloxicam , metFORMIN, mirabegron  ER, naloxone , omeprazole, oxyCODONE , pravastatin , pregabalin , primidone , propranolol , sertraline , and sitaGLIPtin  History Review  Allergy: [name] is allergic to 5-alpha reductase inhibitors, desvenlafaxine, glatiramer, rebif [interferon beta-1a], gramineae pollens, latex, and tramadol . Drug: [name]  has no history on file for drug use. Alcohol:  reports no history of alcohol use. Tobacco:  reports that she quit smoking about 7 years ago. Her smoking use included cigarettes. She started smoking about 25 years ago. She has a 9 pack-year smoking history. She has quit using smokeless tobacco. Social: [name]  reports that she quit smoking about 7 years ago. Her smoking use included cigarettes. She started smoking about 25 years ago. She has a 9 pack-year smoking history. She has quit using smokeless tobacco. She reports that she does not drink alcohol. Medical:  has a past medical history of Acute postoperative pain (04/30/2019), Diabetes mellitus  without complication (HCC), GERD (gastroesophageal reflux disease), Headache, MS (multiple sclerosis) (HCC), Numbness and tingling of both legs below knees, Osteoarthritis, Vertigo, and Wears dentures. Surgical: [name]  has a past surgical history that includes Abdominal hysterectomy; Cesarean section; Tumor removal (Left); Cataract extraction w/PHACO (Right, 03/18/2019); and Cataract extraction w/PHACO (Left, 04/13/2019). Family: family history is not on file. She was adopted.  Laboratory Chemistry Profile   Renal Lab Results  Component Value  Date   BUN 6 03/13/2024   CREATININE 0.55 03/13/2024   BCR 13 11/27/2018   GFRAA >60 09/25/2019   GFRNONAA >60 03/13/2024    Hepatic Lab Results  Component Value Date   AST 19 03/13/2024   ALT 14 03/13/2024   ALBUMIN 3.2 (L) 03/13/2024   ALKPHOS 71 03/13/2024    Electrolytes Lab Results  Component Value Date   NA 141 03/13/2024   K 3.5 03/13/2024   CL 100 03/13/2024   CALCIUM 9.2 03/13/2024   MG 1.4 (L) 03/13/2024   PHOS 2.7 03/13/2024    Bone Lab Results  Component Value Date   25OHVITD1 55 11/27/2018   25OHVITD2 <1.0 11/27/2018   25OHVITD3 55 11/27/2018    Inflammation (CRP: Acute Phase) (ESR: Chronic Phase) Lab Results  Component Value Date   CRP 10 11/27/2018   ESRSEDRATE 42 (H) 11/27/2018   LATICACIDVEN 0.7 03/10/2024         Note: Above Lab results reviewed.  Recent Imaging Review  Overnight EEG with video Shelton Arlin KIDD, MD     03/12/2024  8:44 AM Patient Name: Sehar Sedano  MRN: [id number]  Epilepsy Attending: Arlin KIDD Shelton  Referring Physician/Provider: Michaela Aisha SQUIBB, MD  Duration: 03/11/2024 0447 to 03/12/2024 0447  Patient history: 53 y.o. female with hx of MS and prior seizure  who was found down and hypotensive in her house today. EEG to  evaluate for seizure.  Level of alertness: Awake  AEDs during EEG study: None  Technical aspects: This EEG study was done with scalp electrodes  positioned according to the 10-20 International system of  electrode placement. Electrical activity was reviewed with band  pass filter of 1-70Hz , sensitivity of 7 uV/mm, display speed of  15mm/sec with a 60Hz  notched filter applied as appropriate. EEG  data were recorded continuously and digitally stored.  Video  monitoring was available and reviewed as appropriate.  Description: The posterior dominant rhythm consists of 8 Hz  activity of moderate voltage (25-35 uV) seen predominantly in  posterior head regions, symmetric and reactive to eye  opening and  eye closing. Hyperventilation and photic stimulation were not  performed.     Of note, EEG was technically difficult due to near constant  chewing artifact.  IMPRESSION: This study is within normal limits. No seizures or epileptiform  discharges were seen throughout the recording.  A normal interictal EEG does not exclude the diagnosis of  epilepsy.  Priyanka KIDD Shelton  Note: Reviewed        Physical Exam  Vitals: BP 123/77 (BP Location: Right Arm, Patient Position: Sitting, Cuff Size: Normal)   Pulse 69   Temp (!) 97.2 F (36.2 C) (Temporal)   Resp 16   Ht 5' (1.524 m)   Wt 190 lb (86.2 kg)   SpO2 99%   BMI 37.11 kg/m  BMI: Estimated body mass index is 37.11 kg/m as calculated from the following:   Height as of this encounter: 5' (1.524 m).   Weight as of this encounter: 190 lb (86.2 kg).  Ideal: Ideal body weight: 45.5 kg (100 lb 4.9 oz) Adjusted ideal body weight: 61.8 kg (136 lb 3 oz) General appearance: Well nourished, well developed, and well hydrated. In no apparent acute distress Mental status: Alert, oriented x 3 (person, place, & time)       Respiratory: No evidence of acute respiratory distress Eyes: PERLA   Assessment   Diagnosis Status  1. Chronic low back pain (1ry area of Pain) (Bilateral) (L>R) w/ sciatica (Bilateral)   2. Chronic lower extremity pain (2ry area of Pain) (Bilateral) (R>L)   3. Multiple sclerosis (HCC)   4. Chronic sacroiliac joint pain (Bilateral) (L>R)   5. Lumbar facet joint pain   6. Lumbar facet syndrome (Bilateral) (R>L)   7. Chronic hip pain (Bilateral)   8. Chronic knee pain (Bilateral)   9. Chronic pain syndrome   10. Pharmacologic therapy   11. Chronic use of opiate for therapeutic purpose   12. Encounter for medication management   13. Encounter for chronic pain management    Controlled Controlled Controlled   Updated Problems: No problems updated.  Plan of Care  Problem-specific:  Assessment and  Plan We will continue on current medication regimen.  Prescribing drug monitoring (PDMP) reviewed; findings consistent with the use of prescribed medication and no evidence of narcotic misuse or abuse. Routine UDS ordered today.  Schedule follow-up in 90 days for medication management.  No other new issues or problems reported to this visit.   Shannon Benitez has a current medication list which includes the following long-term medication(s): baclofen , lamotrigine , levetiracetam , meloxicam , metformin, naloxone , omeprazole, pravastatin , pregabalin , primidone , propranolol , sertraline , sitagliptin, [START ON 05/17/2024] oxycodone , [START ON 06/16/2024] oxycodone , and [START ON 07/16/2024] oxycodone .  Pharmacotherapy (Medications Ordered): Meds ordered this encounter  Medications   oxyCODONE  (OXY IR/ROXICODONE ) 5 MG immediate release tablet    Sig: Take 1 tablet (5 mg total) by mouth every 6 (six) hours as needed for severe pain (pain score 7-10). Must last 30 days.    Dispense:  120 tablet    Refill:  0    DO NOT: delete (not duplicate); no partial-fill (will deny script to complete), no refill request (F/U required). DISPENSE: 1 day early if closed on fill date. WARN: No CNS-depressants within 8 hrs of med.   oxyCODONE  (OXY IR/ROXICODONE ) 5 MG immediate release tablet    Sig: Take 1 tablet (5 mg total) by mouth every 6 (six) hours as needed for severe pain (pain score 7-10). Must last 30 days.    Dispense:  120 tablet    Refill:  0    DO NOT: delete (not duplicate); no partial-fill (will deny script to complete), no refill request (F/U required). DISPENSE: 1 day early if closed on fill date. WARN: No CNS-depressants within 8 hrs of med.   oxyCODONE  (OXY IR/ROXICODONE ) 5 MG immediate release tablet    Sig: Take 1 tablet (5 mg total) by mouth every 6 (six) hours as needed for severe pain (pain score 7-10). Must last 30 days.    Dispense:  120 tablet    Refill:  0    DO NOT: delete (not  duplicate); no partial-fill (will deny script to complete), no refill request (F/U required). DISPENSE: 1 day early if closed on fill date. WARN: No CNS-depressants within 8 hrs of med.   Orders:  Orders Placed This Encounter  Procedures   ToxASSU[employer] Select 13 (MW), Urine    Volume: 30 ml(s). Minimum 3 ml of urine is needed. Document temperature  of fresh sample. Indications: Long term (current) use of opiate analgesic (S20.108)    Release to patient:   Immediate        Return in about 3 months (around 08/13/2024) for (F2F), (MM), Emmy Blanch NP.    Recent Visits No visits were found meeting these conditions. Showing recent visits within past 90 days and meeting all other requirements Today's Visits Date Type Provider Dept  05/13/24 Office Visit Mayson Mcneish K, NP [facility]  Showing today's visits and meeting all other requirements Future Appointments No visits were found meeting these conditions. Showing future appointments within next 90 days and meeting all other requirements  I discussed the assessment and treatment plan with the patient. The patient was provided an opportunity to ask questions and all were answered. The patient agreed with the plan and demonstrated an understanding of the instructions.  Patient advised to call back or seek an in-person evaluation if the symptoms or condition worsens.  Duration of encounter: 30 minutes.  Total time on encounter, as per AMA guidelines included both the face-to-face and non-face-to-face time personally spent by the physician and/or other qualified health care professional(s) on the day of the encounter (includes time in activities that require the physician or other qualified health care professional and does not include time in activities normally performed by clinical staff). Physician's time may include the following activities when performed: Preparing to see the patient (e.g., pre-charting review of records, searching  for previously ordered imaging, lab work, and nerve conduction tests) Review of prior analgesic pharmacotherapies. Reviewing PMP Interpreting ordered tests (e.g., lab work, imaging, nerve conduction tests) Performing post-procedure evaluations, including interpretation of diagnostic procedures Obtaining and/or reviewing separately obtained history Performing a medically appropriate examination and/or evaluation Counseling and educating the patient/family/caregiver Ordering medications, tests, or procedures Referring and communicating with other health care professionals (when not separately reported) Documenting clinical information in the electronic or other health record Independently interpreting results (not separately reported) and communicating results to the patient/ family/caregiver Care coordination (not separately reported)  Note by: Hykeem Ojeda K Gradie Ohm, NP (TTS and AI technology used. I apologize for any typographical errors that were not detected and corrected.) Date: 05/13/2024; Time: 1:40 PM

## 2024-05-13 NOTE — Progress Notes (Signed)
 Nursing Pain Medication Assessment:  Safety precautions to be maintained throughout the outpatient stay will include: orient to surroundings, keep bed in low position, maintain call bell within reach at all times, provide assistance with transfer out of bed and ambulation.  Medication Inspection Compliance: Pill count conducted under aseptic conditions, in front of the patient. Neither the pills nor the bottle was removed from the patient's sight at any time. Once count was completed pills were immediately returned to the patient in their original bottle.  Medication: Oxycodone  IR Pill/Patch Count: 18 of 120 pills/patches remain Pill/Patch Appearance: Markings consistent with prescribed medication Bottle Appearance: Standard pharmacy container. Clearly labeled. Filled Date: 06 / 13 / 2025 Last Medication intake:  Today

## 2024-05-17 LAB — TOXASSURE SELECT 13 (MW), URINE

## 2024-05-21 NOTE — Telephone Encounter (Signed)
 Copied from CRM [id number]. Topic: Access To Clinicians - Medication Refill >> May 21, 2024  2:20 PM Alan SQUIBB wrote: The caller has not previously requested the refill from their pharmacy.  The patient is requesting the following:   Medication(s) for refill: baclofen  (LIORESAL ) 20 MG tablet  Quantity for 3 month supply  Pharmacy name and address: [facility] - [place], [place] - [address]    Please contact The patient by [employer] in regards to this request.  Coverage: yes, coverage is accurate on file.  Medication request callback turnaround time: 72 business hours. Programmer, systems Notified)

## 2024-05-22 NOTE — Telephone Encounter (Signed)
 Left voice message regarding refill on Baclofen , will also send MyChart message   Leighton Dage, CMA

## 2024-05-29 ENCOUNTER — Inpatient Hospital Stay: Payer: MEDICAID | Attending: Internal Medicine

## 2024-06-03 ENCOUNTER — Observation Stay
Admission: EM | Admit: 2024-06-03 | Discharge: 2024-06-04 | Disposition: A | Discharge status: Home / Self Care | Payer: MEDICAID | Attending: Internal Medicine | Admitting: Internal Medicine

## 2024-06-03 ENCOUNTER — Emergency Department: Payer: MEDICAID

## 2024-06-03 DIAGNOSIS — E119 Type 2 diabetes mellitus without complications: Secondary | ICD-10-CM | POA: Diagnosis not present

## 2024-06-03 DIAGNOSIS — R918 Other nonspecific abnormal finding of lung field: Secondary | ICD-10-CM | POA: Insufficient documentation

## 2024-06-03 DIAGNOSIS — Z87891 Personal history of nicotine dependence: Secondary | ICD-10-CM | POA: Insufficient documentation

## 2024-06-03 DIAGNOSIS — R400 Somnolence: Secondary | ICD-10-CM | POA: Diagnosis not present

## 2024-06-03 DIAGNOSIS — Z7982 Long term (current) use of aspirin: Secondary | ICD-10-CM | POA: Diagnosis not present

## 2024-06-03 DIAGNOSIS — G9341 Metabolic encephalopathy: Secondary | ICD-10-CM | POA: Insufficient documentation

## 2024-06-03 DIAGNOSIS — F32A Depression, unspecified: Secondary | ICD-10-CM | POA: Insufficient documentation

## 2024-06-03 DIAGNOSIS — K219 Gastro-esophageal reflux disease without esophagitis: Secondary | ICD-10-CM | POA: Diagnosis not present

## 2024-06-03 DIAGNOSIS — G894 Chronic pain syndrome: Secondary | ICD-10-CM | POA: Insufficient documentation

## 2024-06-03 DIAGNOSIS — J811 Chronic pulmonary edema: Secondary | ICD-10-CM | POA: Diagnosis not present

## 2024-06-03 DIAGNOSIS — G35 Multiple sclerosis: Secondary | ICD-10-CM | POA: Insufficient documentation

## 2024-06-03 DIAGNOSIS — E876 Hypokalemia: Secondary | ICD-10-CM | POA: Diagnosis not present

## 2024-06-03 DIAGNOSIS — R4182 Altered mental status, unspecified: Secondary | ICD-10-CM | POA: Diagnosis present

## 2024-06-03 DIAGNOSIS — Z91040 Latex allergy status: Secondary | ICD-10-CM | POA: Diagnosis not present

## 2024-06-03 DIAGNOSIS — G40909 Epilepsy, unspecified, not intractable, without status epilepticus: Secondary | ICD-10-CM | POA: Insufficient documentation

## 2024-06-03 DIAGNOSIS — R404 Transient alteration of awareness: Principal | ICD-10-CM

## 2024-06-03 LAB — COMPREHENSIVE METABOLIC PANEL WITH GFR
ALT: 15 U/L (ref 0–44)
AST: 22 U/L (ref 15–41)
Albumin: 2.8 g/dL — ABNORMAL LOW (ref 3.5–5.0)
Alkaline Phosphatase: 77 U/L (ref 38–126)
Anion gap: 11 (ref 5–15)
BUN: 15 mg/dL (ref 6–20)
CO2: 25 mmol/L (ref 22–32)
Calcium: 9 mg/dL (ref 8.9–10.3)
Chloride: 106 mmol/L (ref 98–111)
Creatinine, Ser: 0.75 mg/dL (ref 0.44–1.00)
GFR, Estimated: >60 mL/min (ref >60)
Glucose, Bld: 154 mg/dL — ABNORMAL HIGH (ref 70–99)
Potassium: 3.1 mmol/L — ABNORMAL LOW (ref 3.5–5.1)
Sodium: 142 mmol/L (ref 135–145)
Total Bilirubin: 0.2 mg/dL (ref 0.0–1.2)
Total Protein: 6.9 g/dL (ref 6.5–8.1)

## 2024-06-03 LAB — CBC WITH DIFFERENTIAL/PLATELET
Abs Immature Granulocytes: 0.03 K/uL (ref 0.00–0.07)
Basophils Absolute: 0 K/uL (ref 0.0–0.1)
Basophils Relative: 1 %
Eosinophils Absolute: 0.1 K/uL (ref 0.0–0.5)
Eosinophils Relative: 2 %
HCT: 37.7 % (ref 36.0–46.0)
Hemoglobin: 12.2 g/dL (ref 12.0–15.0)
Immature Granulocytes: 1 %
Lymphocytes Relative: 9 %
Lymphs Abs: 0.5 K/uL — ABNORMAL LOW (ref 0.7–4.0)
MCH: 28.6 pg (ref 26.0–34.0)
MCHC: 32.4 g/dL (ref 30.0–36.0)
MCV: 88.3 fL (ref 80.0–100.0)
Monocytes Absolute: 0.9 K/uL (ref 0.1–1.0)
Monocytes Relative: 16 %
Neutro Abs: 3.8 K/uL (ref 1.7–7.7)
Neutrophils Relative %: 71 %
Platelets: 227 K/uL (ref 150–400)
RBC: 4.27 MIL/uL (ref 3.87–5.11)
RDW: 14.8 % (ref 11.5–15.5)
WBC: 5.3 K/uL (ref 4.0–10.5)
nRBC: 0 % (ref 0.0–0.2)

## 2024-06-03 LAB — GLUCOSE, CAPILLARY: Glucose-Capillary: 125 mg/dL — ABNORMAL HIGH (ref 70–99)

## 2024-06-03 LAB — HEMOGLOBIN A1C
Hgb A1c MFr Bld: 5.4 % (ref 4.8–5.6)
Mean Plasma Glucose: 108.28 mg/dL

## 2024-06-03 LAB — TSH: TSH: 0.263 u[IU]/mL — ABNORMAL LOW (ref 0.350–4.500)

## 2024-06-03 MED ORDER — INSULIN ASPART 100 UNIT/ML IJ SOLN
0.0000 [IU] | Freq: Three times a day (TID) | INTRAMUSCULAR | Status: DC
  Administered 2024-06-03 – 2024-06-04 (×3): 1 [IU] via SUBCUTANEOUS
  Filled 2024-06-03 (×3): qty 1

## 2024-06-03 MED ORDER — LACTATED RINGERS IV SOLN: INTRAVENOUS | Status: AC

## 2024-06-03 MED ORDER — SODIUM CHLORIDE 0.9 % IV BOLUS
1000.0000 mL | Freq: Once | INTRAVENOUS | Status: AC
  Administered 2024-06-03: 1000 mL via INTRAVENOUS

## 2024-06-03 MED ORDER — LEVETIRACETAM (KEPPRA) 500 MG/5 ML ADULT IV PUSH
1000.0000 mg | Freq: Once | INTRAVENOUS | Status: AC
  Administered 2024-06-03: 1000 mg via INTRAVENOUS
  Filled 2024-06-03: qty 10

## 2024-06-03 MED ORDER — SENNOSIDES-DOCUSATE SODIUM 8.6-50 MG PO TABS: 1.0000 | ORAL_TABLET | Freq: Every evening | ORAL | Status: DC | PRN

## 2024-06-03 MED ORDER — ACETAMINOPHEN-CAFFEINE 500-65 MG PO TABS: 1.0000 | ORAL_TABLET | Freq: Three times a day (TID) | ORAL | Status: DC | PRN

## 2024-06-03 MED ORDER — ENOXAPARIN SODIUM 40 MG/0.4ML IJ SOSY
40.0000 mg | PREFILLED_SYRINGE | INTRAMUSCULAR | Status: DC
  Administered 2024-06-03: 40 mg via SUBCUTANEOUS
  Filled 2024-06-03: qty 0.4

## 2024-06-03 MED ORDER — ACETAMINOPHEN 650 MG RE SUPP: 650.0000 mg | Freq: Four times a day (QID) | RECTAL | Status: DC | PRN

## 2024-06-03 MED ORDER — SERTRALINE HCL 50 MG PO TABS
50.0000 mg | ORAL_TABLET | Freq: Every day | ORAL | Status: DC
Start: 2024-06-03 — End: 2024-06-04
  Administered 2024-06-03 – 2024-06-04 (×2): 50 mg via ORAL
  Filled 2024-06-03 (×2): qty 1

## 2024-06-03 MED ORDER — ONDANSETRON HCL 4 MG/2ML IJ SOLN: 4.0000 mg | Freq: Four times a day (QID) | INTRAMUSCULAR | Status: DC | PRN

## 2024-06-03 MED ORDER — POTASSIUM CHLORIDE CRYS ER 20 MEQ PO TBCR: 40.0000 meq | EXTENDED_RELEASE_TABLET | Freq: Once | ORAL | Status: DC

## 2024-06-03 MED ORDER — ONDANSETRON HCL 4 MG PO TABS
4.0000 mg | ORAL_TABLET | Freq: Four times a day (QID) | ORAL | Status: DC | PRN
Start: 2024-06-03 — End: 2024-06-04

## 2024-06-03 MED ORDER — NALOXONE HCL 2 MG/2ML IJ SOSY: 1.0000 mg | PREFILLED_SYRINGE | Freq: Once | INTRAMUSCULAR | Status: DC

## 2024-06-03 MED ORDER — MIRABEGRON ER 50 MG PO TB24
50.0000 mg | ORAL_TABLET | Freq: Every day | ORAL | Status: DC
Start: 2024-06-03 — End: 2024-06-04
  Administered 2024-06-03: 50 mg via ORAL
  Filled 2024-06-03 (×2): qty 1

## 2024-06-03 MED ORDER — LAMOTRIGINE 100 MG PO TABS
50.0000 mg | ORAL_TABLET | Freq: Two times a day (BID) | ORAL | Status: DC
  Administered 2024-06-03 – 2024-06-04 (×2): 50 mg via ORAL
  Filled 2024-06-03 (×2): qty 1

## 2024-06-03 MED ORDER — NALOXONE HCL 2 MG/2ML IJ SOSY
0.4000 mg | PREFILLED_SYRINGE | Freq: Once | INTRAMUSCULAR | Status: AC
  Administered 2024-06-03: 0.4 mg via INTRAVENOUS
  Filled 2024-06-03: qty 2

## 2024-06-03 MED ORDER — BISACODYL 5 MG PO TBEC: 5.0000 mg | DELAYED_RELEASE_TABLET | Freq: Every day | ORAL | Status: DC | PRN

## 2024-06-03 MED ORDER — ACETAMINOPHEN 325 MG PO TABS
650.0000 mg | ORAL_TABLET | Freq: Four times a day (QID) | ORAL | Status: DC | PRN
  Administered 2024-06-04: 650 mg via ORAL
  Filled 2024-06-03: qty 2

## 2024-06-03 MED ORDER — ONDANSETRON HCL 4 MG/2ML IJ SOLN
4.0000 mg | Freq: Once | INTRAMUSCULAR | Status: AC
  Administered 2024-06-03: 4 mg via INTRAVENOUS
  Filled 2024-06-03: qty 2

## 2024-06-03 MED ORDER — POTASSIUM CHLORIDE CRYS ER 20 MEQ PO TBCR
40.0000 meq | EXTENDED_RELEASE_TABLET | Freq: Once | ORAL | Status: AC
  Administered 2024-06-03: 40 meq via ORAL
  Filled 2024-06-03: qty 2

## 2024-06-03 MED ORDER — PROPRANOLOL HCL 40 MG PO TABS
40.0000 mg | ORAL_TABLET | Freq: Two times a day (BID) | ORAL | Status: DC
  Administered 2024-06-03 – 2024-06-04 (×2): 40 mg via ORAL
  Filled 2024-06-03 (×3): qty 1

## 2024-06-03 MED ORDER — DIMETHYL FUMARATE 240 MG PO CPDR: 1.0000 | DELAYED_RELEASE_CAPSULE | Freq: Two times a day (BID) | ORAL | Status: DC

## 2024-06-03 MED ORDER — LEVETIRACETAM 500 MG PO TABS
500.0000 mg | ORAL_TABLET | Freq: Two times a day (BID) | ORAL | Status: DC
  Administered 2024-06-03 – 2024-06-04 (×2): 500 mg via ORAL
  Filled 2024-06-03 (×2): qty 1

## 2024-06-03 MED ORDER — PANTOPRAZOLE SODIUM 40 MG PO TBEC
40.0000 mg | DELAYED_RELEASE_TABLET | Freq: Every day | ORAL | Status: DC
  Administered 2024-06-03 – 2024-06-04 (×2): 40 mg via ORAL
  Filled 2024-06-03 (×2): qty 1
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
## 2024-06-03 NOTE — ED Notes (Signed)
 Upon RN arrival to room pt drowsy but alert. Pt able to ambulate to bedside commode with RN. Pt nodding off on toilet but responsive to verbal and painful stimuli. Pt declining to stay and still requesting to be discharged.

## 2024-06-03 NOTE — ED Provider Notes (Addendum)
 [facility] Provider Note    Event Date/Time   First MD Initiated Contact with Patient 06/03/24 [phone]     (approximate)   History   Seizures   HPI  Shannon Benitez is a 53 year old female with history of MS, chronic pain syndrome, seizure disorder presenting to the ER for evaluation of altered mental status.  When patient's son returned home this morning, patient was found in bed incontinent and poorly responsive.  On EMS arrival, patient responding to her name, improving mental status and route.  No witnessed seizure-like activity.  Patient currently feels back at her baseline.  She remembers going to bed last night, denies any complaints at that time.  Does report that she has had some decreased p.o. intake over the past few days, but denies nausea, abdominal pain, vomiting.  Reports compliance with her medications, no additional doses, no missed doses of her AEDs.  I reviewed her discharge summary from 03/14/2024.  At that time, she presented to our ER after being found down, transferred to [facility] for continuous EEG.  There, EEG without seizure, but with her seizure history, her antiepileptics were resumed, altered mental status thought to possibly related to polypharmacy.       Physical Exam   Triage Vital Signs: ED Triage Vitals  Encounter Vitals Group     BP 06/03/24 0729 (!) 113/51     Girls Systolic BP Percentile --      Girls Diastolic BP Percentile --      Boys Systolic BP Percentile --      Boys Diastolic BP Percentile --      Pulse Rate 06/03/24 0729 76     Resp 06/03/24 0729 17     Temp 06/03/24 0733 97.6 F (36.4 C)     Temp Source 06/03/24 0733 Axillary     SpO2 06/03/24 0729 99 %     Weight --      Height --      Head Circumference --      Peak Flow --      Pain Score 06/03/24 0728 0     Pain Loc --      Pain Education --      Exclude from Growth Chart --     Most recent vital signs: Vitals:   06/03/24 1100 06/03/24 1130   BP: (!) 140/70 (!) 145/111  Pulse: 61 60  Resp: (!) 23 (!) 21  Temp: 97.9 F (36.6 C)   SpO2: 95% 94%     General: Awake, interactive  Head:  Atraumatic CV:  Regular rate, good peripheral perfusion.  Resp:  Unlabored respirations, lungs clear to auscultation Abd:  Nondistended.  Neuro:  Alert and oriented, normal extraocular movements, symmetric facial movement, sensation intact over bilateral upper and lower extremities with 5 out of 5 strength.  Normal finger-to-nose testing.   ED Results / Procedures / Treatments   Labs (all labs ordered are listed, but only abnormal results are displayed) Labs Reviewed  CBC WITH DIFFERENTIAL/PLATELET - Abnormal; Notable for the following components:      Result Value   Lymphs Abs 0.5 (*)    All other components within normal limits  COMPREHENSIVE METABOLIC PANEL WITH GFR - Abnormal; Notable for the following components:   Potassium 3.1 (*)    Glucose, Bld 154 (*)    Albumin 2.8 (*)    All other components within normal limits  LAMOTRIGINE  LEVEL  LEVETIRACETAM  LEVEL  URINALYSIS, COMPLETE (UACMP) WITH MICROSCOPIC  CBC  CREATININE, SERUM  TSH     EKG EKG independently reviewed and interpreted by myself demonstrates:  Initial EKG demonstrates  narrow complex rhythm at a rate of 75, QRS 106, QTc 473, interpreted by computer as accelerated junctional rhythm, but suspect likely sinus rhythm.  Repeat EKG obtained which more clearly demonstrates sinus rhythm at a rate of 76, PR 160, QRS 85, QTc 44, no acute ST changes  RADIOLOGY Imaging independently reviewed and interpreted by myself demonstrates:    Formal Radiology Read:  [facility] Chest Port 1 View Result Date: 06/03/2024 CLINICAL DATA:  Altered mental status. EXAM: PORTABLE CHEST 1 VIEW COMPARISON:  03/10/2024. FINDINGS: Low lung volume. Mild-to-moderate diffuse pulmonary vascular congestion, likely accentuated by low lung volume. Bilateral lung fields are clear. No acute consolidation  or lung collapse. Bilateral costophrenic angles are clear. Mildly enlarged cardio-mediastinal silhouette, which is accentuated by low lung volume and AP technique. Evaluation is also limited due to patient rotation. No acute osseous abnormalities. The soft tissues are within normal limits. IMPRESSION: Mild-to-moderate diffuse pulmonary vascular congestion, likely accentuated by low lung volume. No acute consolidation or lung collapse. Electronically Signed   By: Ree Molt M.D.   On: 06/03/2024 13:54   CT Head Wo Contrast Result Date: 06/03/2024 CLINICAL DATA:  53 year old female found unresponsive at 0630 hours. Previous exams state history of multiple sclerosis. EXAM: CT HEAD WITHOUT CONTRAST TECHNIQUE: Contiguous axial images were obtained from the base of the skull through the vertex without intravenous contrast. RADIATION DOSE REDUCTION: This exam was performed according to the departmental dose-optimization program which includes automated exposure control, adjustment of the mA and/or kV according to patient size and/or use of iterative reconstruction technique. COMPARISON:  Brain MRI and head CT 03/10/2024. FINDINGS: Brain: Stable cerebral volume. No midline shift, ventriculomegaly, mass effect, evidence of mass lesion, intracranial hemorrhage or evidence of cortically based acute infarction. Stable periventricular white matter hypodensity. Stable gray-white matter differentiation throughout the brain. Vascular: *INSERT* hypodensity Skull: Intact.  No acute osseous abnormality identified. Sinuses/Orbits: Visualized paranasal sinuses and mastoids are stable and well aerated. Other: No acute orbit or scalp soft tissue finding. IMPRESSION: 1. No acute intracranial abnormality. 2. Stable non contrast CT appearance of cerebral white matter disease, with stated history of multiple sclerosis. Electronically Signed   By: VEAR Hurst M.D.   On: 06/03/2024 10:05    PROCEDURES:  Critical Care performed:  No  Procedures   MEDICATIONS ORDERED IN ED: Medications  enoxaparin  (LOVENOX ) injection 40 mg (has no administration in time range)  lactated ringers  infusion (has no administration in time range)  acetaminophen  (TYLENOL ) tablet 650 mg (has no administration in time range)    Or  acetaminophen  (TYLENOL ) suppository 650 mg (has no administration in time range)  senna-docusate (Senokot-S) tablet 1 tablet (has no administration in time range)  bisacodyl  (DULCOLAX) EC tablet 5 mg (has no administration in time range)  ondansetron  (ZOFRAN ) tablet 4 mg (has no administration in time range)    Or  ondansetron  (ZOFRAN ) injection 4 mg (has no administration in time range)  levETIRAcetam  (KEPPRA ) undiluted injection 1,000 mg (1,000 mg Intravenous Given 06/03/24 0744)  ondansetron  (ZOFRAN ) injection 4 mg (4 mg Intravenous Given 06/03/24 0746)  sodium chloride  0.9 % bolus 1,000 mL (0 mLs Intravenous Stopped 06/03/24 0927)  potassium chloride  SA (KLOR-CON  M) CR tablet 40 mEq (40 mEq Oral Given 06/03/24 0856)  naloxone  (NARCAN ) injection 0.4 mg (0.4 mg Intravenous Given 06/03/24 1324)     IMPRESSION / MDM /  ASSESSMENT AND PLAN / ED COURSE  I reviewed the triage vital signs and the nursing notes.  Differential diagnosis includes, but is not limited to, breakthrough seizure with postictal period due to poor PO intake, medication nonadherence, electrolyte abnormality, arryhthmia, anemia, polypharmacy, clinical history and exam not suggestive of head trauma  Patient's presentation is most consistent with acute presentation with potential threat to life or bodily function.  53 year old female presenting after resolved episode of altered mental status with associated urinary incontinence.  Clinical history concerning for unwitnessed seizure-like activity, with resolving postictal period en route.  Reassuring neurologic exam here.  Known seizure disorder, do not think neuroimaging indicated at this time.  Will  obtain labs, trial IV fluids and Zofran  for symptomatic treatment.  80 levels ordered for outpatient planning, will give additional gram of Keppra  here given concern for breakthrough seizure.  CBC reassuring.  CMP with mild hypokalemia, not new for patient, given oral repletion.  Patient reassessed.  Sleeping but arousable.  Updated on results of workup.  Initial plan for discharge with improved mental status. However, notified by RN that patient with recurrent episodes of somnolence, unable to get out of the wheelchair into their car because she was so somnolent.  With this, will plan for continued observation, obtain CT to further evaluate. Clinical Course as of 06/03/24 1410  Wed Jun 03, 2024  1256 Patient serially evaluated.  She remains significantly somnolent on exam, though when aroused does not have any deficits.  Discussed with RN who notes that the patient reported that if she needs to use the bathroom, but cannot stay awake long enough to be assisted out of bed to the bathroom.  Does have a history of somnolence due to polypharmacy in the past which I suspect may be contributing, prolonged postictal.  After seizure consideration though alertness between somnolence less suggestive of this.  However, with her persistent hypersomnolence, do think admission is reasonable.  Will reach out to hospitalist team. [NR]  1305 Case discussed with hospitalist team. Did request trial of Narcan , but will come to evaluate the patient.  [NR]    Clinical Course User Index [NR] Levander Slate, MD     FINAL CLINICAL IMPRESSION(S) / ED DIAGNOSES   Final diagnoses:  Transient alteration of awareness  Somnolence     Rx / DC Orders   ED Discharge Orders     None        Note:  This document was prepared using Dragon voice recognition software and may include unintentional dictation errors.   Levander Slate, MD 06/03/24 9153    Levander Slate, MD 06/03/24 [phone]

## 2024-06-03 NOTE — ED Notes (Signed)
 Pt calling out to use restroom. Pt declining bed pan. Pt unsafe to ambulate to toilet due to increased drowsiness. Pt provided brief.

## 2024-06-03 NOTE — Progress Notes (Signed)
   06/03/24 1505  Assess: MEWS Score  Temp 98.1 F (36.7 C)  BP (!) 151/74  MAP (mmHg) 95  Pulse Rate (!) 58  SpO2 94 %  O2 Device Room Air  Assess: MEWS Score  MEWS Temp 0  MEWS Systolic 0  MEWS Pulse 0  MEWS RR 1  MEWS LOC 2  MEWS Score 3  MEWS Score Color Yellow  Assess: if the MEWS score is Yellow or Red  Were vital signs accurate and taken at a resting state? Yes  Does the patient meet 2 or more of the SIRS criteria? Yes  Does the patient have a confirmed or suspected source of infection? No  MEWS guidelines implemented  Yes, yellow  Treat  MEWS Interventions Considered administering scheduled or prn medications/treatments as ordered  Take Vital Signs  Increase Vital Sign Frequency  Yellow: Q2hr x1, continue Q4hrs until patient remains green for 12hrs  Escalate  MEWS: Escalate Yellow: Discuss with charge nurse and consider notifying provider and/or RRT  Notify: Charge Nurse/RN  Name of Charge Nurse/RN Notified Brandy  Assess: SIRS CRITERIA  SIRS Temperature  0  SIRS Respirations  1  SIRS Pulse 0  SIRS WBC 0  SIRS Score Sum  1

## 2024-06-03 NOTE — Discharge Instructions (Signed)
 You were seen in the ER today for an episode of not responding.  I am glad you are feeling better now.  I suspect you may have had a seizure.  Please make sure you are taking your medications as directed. Let your neurologist know you were seen in the ER and arrange follow-up with them as soon as possible.  Please do not drive, swim or bathe unsupervised, or climb to heights for six months or until otherwise instructed by your doctor.  Return to the ER for new or worsening symptoms.

## 2024-06-03 NOTE — ED Triage Notes (Addendum)
 Pt to Ed via [facility] from home. Son found pt unresponsive with incontinence this morning at 0630. Hx of seizures and MS. Pt was A&Ox1 on EMS arrival. Pt initially 87% on RA. Pt A&Ox3 on arrival. Denies etoh or drug use.  126/98 102 HR 20g LAC  CBG 165

## 2024-06-03 NOTE — ED Notes (Signed)
 Pt alert and requesting to speak to son. RN provided phone.

## 2024-06-03 NOTE — ED Notes (Addendum)
 This RN wheeled pt out. Pt became increasingly drowsy and harder to arouse. RN unable to arouse pt to get into car and pt not safe for discharge. EDP made aware and pt brought back to room 17 for reevaluation.

## 2024-06-04 DIAGNOSIS — G9341 Metabolic encephalopathy: Secondary | ICD-10-CM | POA: Diagnosis not present

## 2024-06-04 LAB — BASIC METABOLIC PANEL WITH GFR
Anion gap: 8 (ref 5–15)
BUN: 7 mg/dL (ref 6–20)
CO2: 27 mmol/L (ref 22–32)
Calcium: 8.9 mg/dL (ref 8.9–10.3)
Chloride: 107 mmol/L (ref 98–111)
Creatinine, Ser: 0.61 mg/dL (ref 0.44–1.00)
GFR, Estimated: >60 mL/min (ref >60)
Glucose, Bld: 133 mg/dL — ABNORMAL HIGH (ref 70–99)
Potassium: 3.8 mmol/L (ref 3.5–5.1)
Sodium: 142 mmol/L (ref 135–145)

## 2024-06-04 LAB — URINALYSIS, COMPLETE (UACMP) WITH MICROSCOPIC
Bilirubin Urine: NEGATIVE
Glucose, UA: NEGATIVE mg/dL
Hgb urine dipstick: NEGATIVE
Ketones, ur: NEGATIVE mg/dL
Nitrite: POSITIVE — AB
Protein, ur: NEGATIVE mg/dL
Specific Gravity, Urine: 1.012 (ref 1.005–1.030)
WBC, UA: >50 WBC/hpf (ref 0–5)
pH: 5 (ref 5.0–8.0)

## 2024-06-04 LAB — GLUCOSE, CAPILLARY
Glucose-Capillary: 137 mg/dL — ABNORMAL HIGH (ref 70–99)
Glucose-Capillary: 148 mg/dL — ABNORMAL HIGH (ref 70–99)

## 2024-06-04 LAB — LAMOTRIGINE LEVEL: Lamotrigine Lvl: 2.1 ug/mL (ref 2.0–20.0)

## 2024-06-04 LAB — MAGNESIUM: Magnesium: 1.7 mg/dL (ref 1.7–2.4)

## 2024-06-04 LAB — LEVETIRACETAM LEVEL: Levetiracetam Lvl: 17.8 ug/mL (ref 10.0–40.0)

## 2024-06-04 MED ORDER — LOPERAMIDE HCL 2 MG PO CAPS
4.0000 mg | ORAL_CAPSULE | Freq: Once | ORAL | Status: AC
  Administered 2024-06-04: 4 mg via ORAL
  Filled 2024-06-04: qty 2

## 2024-06-04 NOTE — Progress Notes (Signed)
 PT Cancellation Note  Patient Details Name: Shannon Benitez MRN: [id number] DOB: [birth year]   Cancelled Treatment:    Reason Eval/Treat Not Completed: Other (comment). Consult received and chart reviewed. Per nursing mobility, pt is listed as indep. Per discussion with OT, pt indep with mobility and is at baseline level. No further needs identified. Will sign off.   Shanaye Rief 06/04/2024, 2:33 PM Corean Dade, PT, DPT, GCS [phone]

## 2024-06-04 NOTE — TOC Initial Note (Signed)
 Transition of Care [facility]) - Initial/Assessment Note    Patient Details  Name: Shannon Benitez MRN: [id number] Date of Birth: [birth year]  [facility]) CM/SW Contact:    Dalia GORMAN Fuse, RN Phone Number: 06/04/2024, 10:41 AM  Clinical Narrative:                 Patient is from home, she remains inpatient with [facility]. Holding baclofen , lyrica , oxycodone , and primidone  for now, will continue seizure medications.   No TOC needs at this time.Outreach to [facility] if needs identfied.         Patient Goals and CMS Choice            Expected Discharge Plan and Services                                              Prior Living Arrangements/Services                       Activities of Daily Living      Permission Sought/Granted                  Emotional Assessment              Admission diagnosis:  Transient alteration of awareness [R40.4] Somnolence [R40.0] Altered mental status, unspecified [R41.82] Patient Active Problem List   Diagnosis Date Noted   Altered mental status, unspecified 06/03/2024   Unresponsive 03/10/2024   Lumbar facet joint pain 07/10/2023   Demyelinating disease of central nervous system, unspecified (HCC) 04/10/2023   Intractable low back pain 12/05/2022   Myofascial pain syndrome of lumbar spine 12/05/2022   Latex precautions, history of latex allergy 10/25/2022   Failure to attend appointment 10/04/2022   Fibromyalgia 03/08/2022   Physical tolerance to opiate drug 07/04/2021   Chronic use of opiate for therapeutic purpose 04/05/2021   Hyponatremia 04/04/2021   Acute exacerbation of chronic low back pain 12/28/2020   Chronic knee pain (Bilateral) 12/28/2020   Osteoarthritis of knee (Right) 12/28/2020   Osteochondroma of proximal fibula (Right) 12/28/2020   Osteochondroma of proximal tibia (Right) 12/28/2020   Osteoarthritis involving multiple joints 12/28/2020   Abnormal MRI, thoracic spine  (12/27/2020) 12/28/2020   Chronic hip pain (Bilateral) 12/28/2020   Osteoarthritis of hips (Bilateral) 12/28/2020   Uncomplicated opioid dependence (HCC) 11/08/2020   Other spondylosis, sacral and sacrococcygeal region 08/18/2020   Macromastia 08/10/2020   Scapulalgia (Left) 04/20/2020   Traumatic injury of scapula (Left) 04/20/2020   Chronic diarrhea 11/17/2019   IDA (iron  deficiency anemia) 11/17/2019   Overactive bladder 11/17/2019   Vitamin B 12 deficiency 11/17/2019   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) 08/17/2019   Abnormal MRI, cervical spine (12/27/20) 08/17/2019   Diabetic peripheral neuropathy (HCC) 02/23/2019   Obesity, Class III, BMI 40-49.9 (morbid obesity) 01/19/2019   Spondylosis without myelopathy or radiculopathy, lumbosacral region 01/01/2019   Lumbar facet syndrome (Bilateral) (R>L) 01/01/2019   Abnormal MRI, lumbar spine (04/18/2023) 01/01/2019   Lumbar facet hypertrophy (Multilevel) (Bilateral) 01/01/2019   Osteoarthritis of facet joint of lumbar spine 01/01/2019   Osteoarthritis of lumbar spine 01/01/2019   DDD (degenerative disc disease), lumbosacral 01/01/2019   Lumbar spondylosis 01/01/2019   Chronic low back pain (Bilateral) (L>R) w/o sciatica 12/17/2018   Muscle spasticity 12/17/2018   Chronic pain syndrome 11/27/2018   Chronic low back  pain (1ry area of Pain) (Bilateral) (L>R) w/ sciatica (Bilateral) 11/27/2018   Chronic lower extremity pain (2ry area of Pain) (Bilateral) (R>L) 11/27/2018   Long term prescription benzodiazepine use 11/27/2018   Pharmacologic therapy 11/27/2018   Disorder of skeletal system 11/27/2018   Problems influencing health status 11/27/2018   Anemia 05/19/2018   Type 2 diabetes mellitus with obesity (HCC) 02/19/2018   Anxiety 01/14/2018   Depression 01/14/2018   Neurogenic pain 01/14/2018   Vitamin D  deficiency 01/14/2018   Medication management 06/10/2017   Other long term (current) drug therapy  06/10/2017   Chronic sacroiliac joint pain (Bilateral) (L>R) 01/30/2017   Generalized seizure disorder (HCC) 11/23/2016   Multiple sclerosis (HCC) 11/23/2016   Tremor 11/23/2016   PCP:  Derenda Rockers, MD Pharmacy:   [facility] - [place], [place] - [address] [place] [address] Phone: [phone] Fax: [phone]  [id number] [place], [place] - [address] [place] [address] Phone: [phone] Fax: [phone]     Social Drivers of Health (SDOH) Social History: SDOH Screenings   Food Insecurity: Patient Unable To Answer (06/03/2024)  Recent Concern: Food Insecurity - Food Insecurity Present (03/11/2024)  Housing: Patient Unable To Answer (06/03/2024)  Transportation Needs: Patient Unable To Answer (06/03/2024)  Utilities: Patient Unable To Answer (06/03/2024)  Recent Concern: Utilities - At Risk (03/11/2024)  Depression (PHQ2-9): Low Risk  (02/12/2024)  Tobacco Use: Medium Risk (06/03/2024)  Health Literacy: Low Risk  (02/13/2021)   [facility] from [facility]   SDOH Interventions:     Readmission Risk Interventions     No data to display

## 2024-06-04 NOTE — TOC Transition Note (Signed)
 Transition of Care [facility]) - Discharge Note   Patient Details  Name: Shannon Benitez MRN: [id number] Date of Birth: 01/03/1971  Transition [facility]) CM/SW Contact:  Dalia GORMAN Fuse, RN Phone Number: 06/04/2024, 2:38 PM   Clinical Narrative:     Patient is medically clear to discharge to home. No TOC needs identified.  Final next level of care: Home/Self Care Barriers to Discharge: Barriers Resolved   Patient Goals and CMS Choice            Discharge Placement                       Discharge Plan and Services Additional resources added to the After Visit Summary for                                       Social Drivers of Health (SDOH) Interventions SDOH Screenings   Food Insecurity: Patient Unable To Answer (06/03/2024)  Recent Concern: Food Insecurity - Food Insecurity Present (03/11/2024)  Housing: Patient Unable To Answer (06/03/2024)  Transportation Needs: Patient Unable To Answer (06/03/2024)  Utilities: Patient Unable To Answer (06/03/2024)  Recent Concern: Utilities - At Risk (03/11/2024)  Depression (PHQ2-9): Low Risk  (02/12/2024)  Tobacco Use: Medium Risk (06/03/2024)  Health Literacy: Low Risk  (02/13/2021)   Received from [facility]     Readmission Risk Interventions     No data to display

## 2024-06-04 NOTE — Progress Notes (Signed)
 This Clinical research associate collected a urine specimen from the patient via clean catch for U/A. The specimen is labeled and sent to lab.

## 2024-06-04 NOTE — Plan of Care (Signed)
 The patient has been alert and oriented x 4 tonight and very talkative. The patient requested to get up to the [facility] with assist x 1  because she said that she does not like the Pure wick. Lactated Ringers  at 125ml/hr continues to right hand.

## 2024-06-04 NOTE — Evaluation (Signed)
 Occupational Therapy Evaluation Patient Details Name: Shannon Benitez MRN: [id number] DOB: 30-Jan-1971 Today's Date: 06/04/2024   History of Present Illness   Pt is a 53 y.o. female who was admitted 2 months ago for altered mental status and is brought in again with altered mental status. PMH: MS, seizures, DM, GERD, vertigo, OA, chronic pain syndrome     Clinical Impressions Pt was seen for OT evaluation this date. PTA pt resides in a one level home with her son who works 3rd shift, but is there during the day. Pt reports her sister lives across the road and is available to assist as well. Pt is typically IND with indoor ambulation and ADL performance, uses a RW when ambulating in the community. Denies falls. She reports she is back to her baseline function and demo ability to ambulate ~60 ft without AD and SBA provided d/t occasional sway which pt reports is baseline from weakness from her MS. She is alert and oriented x4. Son agrees pt is at her baseline and he is okay to take her home. Simulated LB dressing performed with IND. Pt declines need for [facility] therapy services and OT to sign off in house.      If plan is discharge home, recommend the following:   Assist for transportation     Functional Status Assessment   Patient has not had a recent decline in their functional status     Equipment Recommendations   BSC/3in1     Recommendations for Other Services         Precautions/Restrictions   Precautions Precautions: None Recall of Precautions/Restrictions: Intact Restrictions Weight Bearing Restrictions Per Provider Order: No     Mobility Bed Mobility Overal bed mobility: Independent                  Transfers Overall transfer level: Modified independent Equipment used: None               General transfer comment: able to stand and ambulate ~60 ft without AD at SBA level      Balance Overall balance assessment: Modified Independent                                          ADL either performed or assessed with clinical judgement   ADL Overall ADL's : At baseline;Modified independent                                       General ADL Comments: able to simulated LB dressing with no difficulty, reports she is at her baseline     Vision         Perception         Praxis         Pertinent Vitals/Pain Pain Assessment Pain Assessment: No/denies pain     Extremity/Trunk Assessment Upper Extremity Assessment Upper Extremity Assessment: Overall WFL for tasks assessed   Lower Extremity Assessment Lower Extremity Assessment: Overall WFL for tasks assessed       Communication Communication Communication: No apparent difficulties   Cognition Arousal: Alert Behavior During Therapy: WFL for tasks assessed/performed Cognition: No apparent impairments  Following commands: Intact       Cueing  General Comments   Cueing Techniques: Verbal cues  occasional sway that pt reports as baseline, no overt LOB and pt and son reporting she is back to baseline   Exercises Other Exercises Other Exercises: Edu on role of OT in acutes setting as well as HH therapy and equipment needs. Pt is interested in a BSC only if able for nighttime urgency.   Shoulder Instructions      Home Living Family/patient expects to be discharged to:: Private residence Living Arrangements: Children Available Help at Discharge: Family;Available PRN/intermittently;Available 24 hours/day (sister across the road and son lives with her but works 3rd shift) Type of Home: House Home Access: Stairs to enter Secretary/administrator of Steps: 3 Entrance Stairs-Rails: Right;Left Home Layout: One level     Bathroom Shower/Tub: Chief Strategy Officer: Standard     Home Equipment: Agricultural consultant (2 wheels);BSC/3in1;Shower seat   Additional Comments: son  works night shift/3rd shift but is home with her during the day      Prior Functioning/Environment Prior Level of Function : Independent/Modified Independent;Driving             Mobility Comments: reports use of RW outside, no AD indoors, pushes shopping cart through grocery store ADLs Comments: does not work, ind    OT Problem List:     OT Treatment/Interventions:        OT Goals(Current goals can be found in the care plan section)       OT Frequency:       Co-evaluation              AM-PAC OT 6 Clicks Daily Activity     Outcome Measure Help from another person eating meals?: None Help from another person taking care of personal grooming?: None Help from another person toileting, which includes using toliet, bedpan, or urinal?: None Help from another person bathing (including washing, rinsing, drying)?: None Help from another person to put on and taking off regular upper body clothing?: None Help from another person to put on and taking off regular lower body clothing?: None 6 Click Score: 24   End of Session Nurse Communication: Mobility status  Activity Tolerance: Patient tolerated treatment well Patient left: in bed;with call bell/phone within reach;with family/visitor present  OT Visit Diagnosis: Other abnormalities of gait and mobility (R26.89)                Time: 1417-1430 OT Time Calculation (min): 13 min Charges:  OT General Charges $OT Visit: 1 Visit OT Evaluation $OT Eval Low Complexity: 1 Low Heiley Shaikh, OTR/L  06/04/24, 3:24 PM  Kobey Sides E Sharnette Kitamura 06/04/2024, 3:20 PM

## 2024-06-04 NOTE — Discharge Summary (Signed)
 Physician Discharge Summary   Patient: Shannon Benitez MRN: [id number] DOB: [birth year]  Admit date:     06/03/2024  Discharge date: 06/04/24  Discharge Physician: Drue ONEIDA Potter   PCP: Derenda Rockers, MD   Recommendations at discharge:  Follow-up with PCP  Discharge Diagnoses: Acute metabolic encephalopathy secondary to polypharmacy Hypokalemia Multiple sclerosis Seizure disorder Chronic pain syndrome DM 2 Depression GERD  Hospital Course: Shannon Benitez is an 53 y.o. female with MS, chronic pain syndrome, DM 2, seizure disorder who was admitted 2 months ago for altered mental status and is brought in again with altered mental status.   Patient was apparently brought in by her son this morning when he found her in bed incontinent and poorly responsive.  ET scan of the brain did not show any acute intracranial pathology.  Patient's sedation medication was withheld with improvement in mental status.  We believe altered mentation is due to polypharmacy Patient has been counseled extensively to avoid sedating medications and to follow-up with PCP.  Patient worked with PT OT was doing fine with no home health needs.     Consultants: PT OT Procedures performed: None Disposition: Home Diet recommendation:  Cardiac diet DISCHARGE MEDICATION: Allergies as of 06/04/2024       Reactions   5-alpha Reductase Inhibitors    Desvenlafaxine    Allergic reaction   Glatiramer Other (See Comments)   Pt states me gave her cold spells.   Rebif [interferon Beta-1a] Other (See Comments)   The skin around the injection site turned black   Gramineae Pollens Itching   Latex Itching   Only when gloves are worn by pt   Tramadol  Diarrhea, Nausea And Vomiting, Anxiety        Medication List     STOP taking these medications    baclofen  20 MG tablet Commonly known as: LIORESAL    pregabalin  50 MG capsule Commonly known as: LYRICA    primidone  50 MG tablet Commonly known as:  MYSOLINE        TAKE these medications    aspirin-acetaminophen -caffeine  250-250-65 MG tablet Commonly known as: EXCEDRIN MIGRAINE Take by mouth every 6 (six) hours as needed for headache.   Dimethyl Fumarate  240 MG Cpdr Use as directed 1 capsule in the mouth or throat 2 (two) times daily.   lamoTRIgine  25 MG tablet Commonly known as: LAMICTAL  Take 50 mg by mouth 2 (two) times daily.   levETIRAcetam  250 MG tablet Commonly known as: KEPPRA  Take 500 mg by mouth 2 (two) times daily.   loperamide  2 MG tablet Commonly known as: IMODIUM  A-D Take 2 mg by mouth as needed for diarrhea or loose stools.   magnesium  oxide 400 (240 Mg) MG tablet Commonly known as: MAG-OX Take 1 tablet (400 mg total) by mouth daily.   meloxicam  15 MG tablet Commonly known as: MOBIC  Take 1 tablet by mouth daily.   metFORMIN 500 MG 24 hr tablet Commonly known as: GLUCOPHAGE-XR Take 1,000 mg by mouth daily with breakfast.   mirabegron  ER 50 MG Tb24 tablet Commonly known as: MYRBETRIQ  Take 50 mg by mouth at bedtime.   naloxone  4 MG/0.1ML Liqd nasal spray kit Commonly known as: NARCAN  Place 1 spray into the nose as needed for up to 365 doses (for opioid-induced respiratory depresssion). In case of emergency (overdose), spray once into each nostril. If no response within 3 minutes, repeat application and call 911.   omeprazole 40 MG capsule Commonly known as: PRILOSEC Take 40 mg by mouth daily.   oxyCODONE   5 MG immediate release tablet Commonly known as: Oxy IR/ROXICODONE  Take 1 tablet (5 mg total) by mouth every 6 (six) hours as needed for severe pain (pain score 7-10). Must last 30 days.   pravastatin  20 MG tablet Commonly known as: PRAVACHOL  Take 1 tablet (20 mg total) by mouth daily.   propranolol  40 MG tablet Commonly known as: INDERAL  Take by mouth.   sertraline  50 MG tablet Commonly known as: ZOLOFT  Take 1 tablet (50 mg total) by mouth daily.   sitaGLIPtin 100 MG tablet Commonly  known as: JANUVIA Take 1 tablet by mouth daily.        Follow-up Information     Derenda Rockers, MD Follow up.   Specialty: Internal Medicine Why: hospital follow up Contact information: [address] [place] [address] [phone]                Discharge Exam: Shannon Benitez   06/03/24 1556  Weight: 95.3 kg    Gen: Obese female laying in bed alert and oriented x 3 Eyes: sclera anicteric, conjuctiva mildly injected bilaterally CVS: S1-S2, regulary, no gallops, distant heart sound due to body habitus Respiratory: Clear to auscultation GI: NABS, soft, NT  LE: No edema. No cyanosis Neuro: Alert and oriented x 3 able to move upper    Condition at discharge: good  The results of significant diagnostics from this hospitalization (including imaging, microbiology, ancillary and laboratory) are listed below for reference.   Imaging Studies: DG Chest Port 1 View Result Date: 06/03/2024 CLINICAL DATA:  Altered mental status. EXAM: PORTABLE CHEST 1 VIEW COMPARISON:  03/10/2024. FINDINGS: Low lung volume. Mild-to-moderate diffuse pulmonary vascular congestion, likely accentuated by low lung volume. Bilateral lung fields are clear. No acute consolidation or lung collapse. Bilateral costophrenic angles are clear. Mildly enlarged cardio-mediastinal silhouette, which is accentuated by low lung volume and AP technique. Evaluation is also limited due to patient rotation. No acute osseous abnormalities. The soft tissues are within normal limits. IMPRESSION: Mild-to-moderate diffuse pulmonary vascular congestion, likely accentuated by low lung volume. No acute consolidation or lung collapse. Electronically Signed   By: Ree Molt M.D.   On: 06/03/2024 13:54   CT Head Wo Contrast Result Date: 06/03/2024 CLINICAL DATA:  53 year old female found unresponsive at 0630 hours. Previous exams state history of multiple sclerosis. EXAM: CT HEAD WITHOUT CONTRAST TECHNIQUE: Contiguous  axial images were obtained from the base of the skull through the vertex without intravenous contrast. RADIATION DOSE REDUCTION: This exam was performed according to the departmental dose-optimization program which includes automated exposure control, adjustment of the mA and/or kV according to patient size and/or use of iterative reconstruction technique. COMPARISON:  Brain MRI and head CT 03/10/2024. FINDINGS: Brain: Stable cerebral volume. No midline shift, ventriculomegaly, mass effect, evidence of mass lesion, intracranial hemorrhage or evidence of cortically based acute infarction. Stable periventricular white matter hypodensity. Stable gray-white matter differentiation throughout the brain. Vascular: *INSERT* hypodensity Skull: Intact.  No acute osseous abnormality identified. Sinuses/Orbits: Visualized paranasal sinuses and mastoids are stable and well aerated. Other: No acute orbit or scalp soft tissue finding. IMPRESSION: 1. No acute intracranial abnormality. 2. Stable non contrast CT appearance of cerebral white matter disease, with stated history of multiple sclerosis. Electronically Signed   By: VEAR Hurst M.D.   On: 06/03/2024 10:05    Microbiology: Results for orders placed or performed during the hospital encounter of 03/10/24  MRSA Next Gen by PCR, Nasal     Status: None   Collection  Time: 03/10/24  4:50 PM   Specimen: Nasal Mucosa; Nasal Swab  Result Value Ref Range Status   MRSA by PCR Next Gen NOT DETECTED NOT DETECTED Final    Comment: (NOTE) The GeneXpert MRSA Assay (FDA approved for NASAL specimens only), is one component of a comprehensive MRSA colonization surveillance program. It is not intended to diagnose MRSA infection nor to guide or monitor treatment for MRSA infections. Test performance is not FDA approved in patients less than 65 years old. Performed at [facility], [address], [place], [place] [address]     Labs: CBC: Recent Labs  Lab 06/03/24 0740   WBC 5.3  NEUTROABS 3.8  HGB 12.2  HCT 37.7  MCV 88.3  PLT 227   Basic Metabolic Panel: Recent Labs  Lab 06/03/24 0740 06/04/24 0551  NA 142 142  K 3.1* 3.8  CL 106 107  CO2 25 27  GLUCOSE 154* 133*  BUN 15 7  CREATININE 0.75 0.61  CALCIUM 9.0 8.9  MG  --  1.7   Liver Function Tests: Recent Labs  Lab 06/03/24 0740  AST 22  ALT 15  ALKPHOS 77  BILITOT 0.2  PROT 6.9  ALBUMIN 2.8*   CBG: Recent Labs  Lab 06/03/24 1548 06/04/24 0738 06/04/24 1205  GLUCAP 125* 137* 148*    Discharge time spent:  39 minutes.  Signed: Drue ONEIDA Potter, MD [facility] 06/04/2024

## 2024-06-25 ENCOUNTER — Ambulatory Visit: Payer: MEDICAID | Admitting: Oncology

## 2024-06-25 ENCOUNTER — Ambulatory Visit: Payer: MEDICAID

## 2024-06-25 ENCOUNTER — Other Ambulatory Visit: Payer: MEDICAID

## 2024-07-02 ENCOUNTER — Inpatient Hospital Stay: Payer: MEDICAID

## 2024-07-02 ENCOUNTER — Inpatient Hospital Stay: Payer: MEDICAID | Attending: Internal Medicine

## 2024-07-02 ENCOUNTER — Inpatient Hospital Stay: Payer: MEDICAID | Admitting: Oncology

## 2024-08-06 ENCOUNTER — Telehealth: Payer: Self-pay | Admitting: Nurse Practitioner

## 2024-08-06 ENCOUNTER — Encounter: Payer: Self-pay | Admitting: Nurse Practitioner

## 2024-08-06 ENCOUNTER — Ambulatory Visit: Payer: MEDICAID | Attending: Nurse Practitioner | Admitting: Nurse Practitioner

## 2024-08-06 VITALS — BP 130/80 | HR 92 | Temp 97.3°F | Resp 18 | Ht 61.0 in | Wt 190.0 lb

## 2024-08-06 DIAGNOSIS — G43109 Migraine with aura, not intractable, without status migrainosus: Secondary | ICD-10-CM | POA: Insufficient documentation

## 2024-08-06 DIAGNOSIS — M533 Sacrococcygeal disorders, not elsewhere classified: Secondary | ICD-10-CM | POA: Insufficient documentation

## 2024-08-06 DIAGNOSIS — M79605 Pain in left leg: Secondary | ICD-10-CM | POA: Insufficient documentation

## 2024-08-06 DIAGNOSIS — M79604 Pain in right leg: Secondary | ICD-10-CM | POA: Insufficient documentation

## 2024-08-06 DIAGNOSIS — M5442 Lumbago with sciatica, left side: Secondary | ICD-10-CM | POA: Diagnosis present

## 2024-08-06 DIAGNOSIS — G8929 Other chronic pain: Secondary | ICD-10-CM | POA: Diagnosis present

## 2024-08-06 DIAGNOSIS — M5441 Lumbago with sciatica, right side: Secondary | ICD-10-CM | POA: Diagnosis not present

## 2024-08-06 DIAGNOSIS — Z79891 Long term (current) use of opiate analgesic: Secondary | ICD-10-CM | POA: Insufficient documentation

## 2024-08-06 DIAGNOSIS — G894 Chronic pain syndrome: Secondary | ICD-10-CM | POA: Insufficient documentation

## 2024-08-06 DIAGNOSIS — G35D Multiple sclerosis, unspecified: Secondary | ICD-10-CM | POA: Diagnosis present

## 2024-08-06 DIAGNOSIS — G43909 Migraine, unspecified, not intractable, without status migrainosus: Secondary | ICD-10-CM | POA: Insufficient documentation

## 2024-08-06 MED ORDER — UBROGEPANT 100 MG PO TABS: 1.0000 | ORAL_TABLET | Freq: Every day | ORAL | 0 refills | Status: DC

## 2024-08-06 MED ORDER — NALOXONE HCL 4 MG/0.1ML NA LIQD: 1.0000 | NASAL | 0 refills | Status: AC | PRN

## 2024-08-06 MED ORDER — OXYCODONE HCL 5 MG PO TABS: 5.0000 mg | ORAL_TABLET | Freq: Four times a day (QID) | ORAL | 0 refills | Status: DC | PRN

## 2024-08-06 NOTE — Telephone Encounter (Signed)
 I spoke with pharmacy, they cannot get this med from the wholesalers.

## 2024-08-06 NOTE — Telephone Encounter (Signed)
 PT called stated that her current doesn't carry the Ubrogepant.PT wants to know what else can be called in. Please give patient a call. TY

## 2024-08-06 NOTE — Progress Notes (Signed)
 PROVIDER NOTE: Interpretation of information contained herein should be left to medically-trained personnel. Specific patient instructions are provided elsewhere under Patient Instructions section of medical record. This document was created in part using AI and STT-dictation technology, any transcriptional errors that may result from this process are unintentional.  Patient: Shannon Benitez  Service: E/M   PCP: Derenda Rockers, MD  DOB: [birth year]  DOS: 08/06/2024  Provider: Emmy MARLA Blanch, NP  MRN: [id number]  Delivery: Face-to-face  Specialty: Interventional Pain Management  Type: Established Patient  Setting: Ambulatory outpatient facility  Specialty designation: 09  Referring Prov.: Derenda Rockers, MD  Location: Outpatient office facility       History of present illness (HPI) [name], a 53 y.o. year old female, is here today because of her Chronic bilateral low back pain with bilateral sciatica [M54.42, M54.41, G89.29]. [name] primary complain today is Back Pain  Pertinent problems: Shannon Benitez has  Multiple sclerosis (HCC); Neurogenic pain; Chronic sacroiliac joint pain (Bilateral) (L>R); Muscle spasticity; Osteoarthritis of lumbar spine; DDD (degenerative disc disease), lumbosacral; and Chronic pain syndrome on their pertinent problem list  Pain Assessment: Severity of Chronic pain is reported as a 6 /10. Location: Back Lower/Into top of the right leg. Onset: More than a month ago. Quality: Aching, Throbbing, Burning. Timing: Intermittent. Modifying factor(s): Cold Packs, Hot Shower. Vitals:  height is 5' 1 (1.549 m) and weight is 190 lb (86.2 kg). Her temporal temperature is 97.3 F (36.3 C) (abnormal). Her blood pressure is 130/80 and her pulse is 92. Her respiration is 18 and oxygen saturation is 96%.  BMI: Estimated body mass index is 35.9 kg/m as calculated from the following:   Height as of this encounter: 5' 1 (1.549 m).   Weight as of this  encounter: 190 lb (86.2 kg).  Last encounter: 05/13/2024. Last procedure: Visit date not found.  Reason for encounter: medication management.  The patient indicates doing well with current medication regimen.  No adverse reaction or side effects reported to medication.  Her prescription are up-to-date and she did not bring her pill bottle for pill count.  She was advised to bring her pill bottle to each visit for pill count in order to continue prescribing her pain medication. Pharmacy remains unchanged.  The patient experiences pain in her lower back that radiated to bilateral legs with left side being worse than the right side.  She also reports migraine headaches, occurring approximately twice per month and associated with aura.  We discussed treatment option and decided to order Ubrelvy for 1 month.  Pharmacotherapy Assessment   Oxycodone  (oxy IR/oxycodone ) 5 mg immediate release every 6 hours as needed for pain.MME=30 Ubrelvy 100 mg tablet daily by mouth Monitoring: [place] PMP: PDMP reviewed during this encounter.       Pharmacotherapy: No side-effects or adverse reactions reported. Compliance: No problems identified. Effectiveness: Clinically acceptable.  Shannon Benitez, [place]  08/06/2024 12:48 PM  Sign when Signing Visit Nursing Pain Medication Assessment:  Safety precautions to be maintained throughout the outpatient stay will include: orient to surroundings, keep bed in low position, maintain call bell within reach at all times, provide assistance with transfer out of bed and ambulation.  Medication Inspection Compliance: Ms. Lecuyer did not comply with our request to bring her pills to be counted. She was reminded that bringing the medication bottles, even when empty, is a requirement.  Medication: None brought in. Pill/Patch Count: None available to be counted. Bottle Appearance: No container available. Did not  bring bottle(s) to appointment. Filled Date: N/A Last Medication intake:   Today    UDS:  Summary  Date Value Ref Range Status  05/13/2024 FINAL  Final    Comment:    ==================================================================== [employer] (MW) ==================================================================== Test                             Result       Flag       Units  Drug Present and Declared for Prescription Verification   Oxycodone                       851          EXPECTED   ng/mg creat   Noroxycodone                   4013         EXPECTED   ng/mg creat    Sources of oxycodone  include scheduled prescription medications.    Noroxycodone is an expected metabolite of oxycodone .    Phenobarbital                   PRESENT      EXPECTED    Phenobarbital  is an expected metabolite of primidone ; Phenobarbital     may also be administered as a prescription drug.  ==================================================================== Test                      Result    Flag   Units      Ref Range   Creatinine              108              mg/dL      >=79 ==================================================================== Declared Medications:  The flagging and interpretation on this report are based on the  following declared medications.  Unexpected results may arise from  inaccuracies in the declared medications.   **Note: The testing scope of this panel includes these medications:   Oxycodone  (Roxicodone )  Primidone  (Mysoline )   **Note: The testing scope of this panel does not include the  following reported medications:   Acetaminophen  (Excedrin)  Aspirin (Excedrin)  Baclofen  (Lioresal )  Caffeine  (Excedrin)  Lamotrigine  (Lamictal )  Levetiracetam  (Keppra )  Loperamide  (Imodium )  Magnesium  (Mag-Ox)  Meloxicam  (Mobic )  Metformin (Glucophage)  Mirabegron  (Myrbetriq )  Naloxone  (Narcan )  Omeprazole (Prilosec)  Pravastatin  (Pravachol )  Pregabalin  (Lyrica )  Propranolol  (Inderal )  Sertraline  (Zoloft )  Sitagliptin  (Januvia) ==================================================================== For clinical consultation, please call [phone]. ====================================================================     No results found for: CBDTHCR No results found for: D8THCCBX No results found for: D9THCCBX  ROS  Constitutional: Denies any fever or chills Gastrointestinal: No reported hemesis, hematochezia, vomiting, or acute GI distress Musculoskeletal: Low back pain  radiated to bilateral legs with left side being worse than the right side, headache  Neurological: No reported episodes of acute onset apraxia, aphasia, dysarthria, agnosia, amnesia, paralysis, loss of coordination, or loss of consciousness  Medication Review  Dimethyl Fumarate , Ubrogepant, aspirin-acetaminophen -caffeine , lamoTRIgine , levETIRAcetam , loperamide , magnesium  oxide, meloxicam , metFORMIN, mirabegron  ER, naloxone , omeprazole, oxyCODONE , pravastatin , pregabalin , primidone , propranolol , sertraline , and sitaGLIPtin  History Review  Allergy: Ms. Akamine is allergic to 5-alpha reductase inhibitors, desvenlafaxine, glatiramer, rebif [interferon beta-1a], gramineae pollens, latex, and tramadol . Drug: Ms. Rentz  has no history on file for drug use. Alcohol:  reports no history of alcohol use. Tobacco:  reports that she  quit smoking about 7 years ago. Her smoking use included cigarettes. She started smoking about 25 years ago. She has a 9 pack-year smoking history. She has quit using smokeless tobacco. Social: Ms. Yan  reports that she quit smoking about 7 years ago. Her smoking use included cigarettes. She started smoking about 25 years ago. She has a 9 pack-year smoking history. She has quit using smokeless tobacco. She reports that she does not drink alcohol. Medical:  has a past medical history of Acute postoperative pain (04/30/2019), Diabetes mellitus without complication (HCC), GERD (gastroesophageal  reflux disease), Headache, MS (multiple sclerosis), Numbness and tingling of both legs below knees, Osteoarthritis, Vertigo, and Wears dentures. Surgical: Ms. [place]  has a past surgical history that includes Abdominal hysterectomy; Cesarean section; Tumor removal (Left); Cataract extraction w/PHACO (Right, 03/18/2019); and Cataract extraction w/PHACO (Left, 04/13/2019). Family: family history is not on file. She was adopted.  Laboratory Chemistry Profile   Renal Lab Results  Component Value Date   BUN 7 06/04/2024   CREATININE 0.61 06/04/2024   BCR 13 11/27/2018   GFRAA >60 09/25/2019   GFRNONAA >60 06/04/2024    Hepatic Lab Results  Component Value Date   AST 22 06/03/2024   ALT 15 06/03/2024   ALBUMIN 2.8 (L) 06/03/2024   ALKPHOS 77 06/03/2024    Electrolytes Lab Results  Component Value Date   NA 142 06/04/2024   K 3.8 06/04/2024   CL 107 06/04/2024   CALCIUM 8.9 06/04/2024   MG 1.7 06/04/2024   PHOS 2.7 03/13/2024    Bone Lab Results  Component Value Date   25OHVITD1 55 11/27/2018   25OHVITD2 <1.0 11/27/2018   25OHVITD3 55 11/27/2018    Inflammation (CRP: Acute Phase) (ESR: Chronic Phase) Lab Results  Component Value Date   CRP 10 11/27/2018   ESRSEDRATE 42 (H) 11/27/2018   LATICACIDVEN 0.7 03/10/2024         Note: Above Lab results reviewed.  Recent Imaging Review   MR LUMBAR SPINE WO CONTRAST CLINICAL DATA:  Low back pain for over 6 weeks   EXAM: MRI LUMBAR SPINE WITHOUT CONTRAST   TECHNIQUE: Multiplanar, multisequence MR imaging of the lumbar spine was performed. No intravenous contrast was administered.   COMPARISON:  09/22/2020   FINDINGS: Segmentation:  Standard.   Alignment:  2 mm retrolisthesis of L5 on S1.   Vertebrae: No acute fracture, evidence of discitis, or aggressive bone lesion.   Conus medullaris and cauda equina: Conus extends to the L2 level. Conus and cauda equina appear normal.   Paraspinal and other soft tissues:  No acute paraspinal abnormality.   Disc levels:   Disc spaces: Disc desiccation at L3-4, L4-5 and L5-S1.   T12-L1: No significant disc bulge. No neural foraminal stenosis. No central canal stenosis.   L1-L2: No significant disc bulge. Mild bilateral facet arthropathy. No foraminal or central canal stenosis.   L2-L3: No significant disc bulge. Mild bilateral facet arthropathy. No foraminal or central canal stenosis.   L3-L4: No significant disc bulge. Mild bilateral facet arthropathy. No foraminal or central canal stenosis.   L4-L5: Mild broad-based disc bulge with a small shallow central disc protrusion. Mild bilateral facet arthropathy. No foraminal or central canal stenosis.   L5-S1: Mild broad-based disc bulge. Mild bilateral facet arthropathy. No foraminal or central canal stenosis.   IMPRESSION: 1. Mild lumbar spine spondylosis as described above without significant interval change compared with 09/22/2020. 2. No acute osseous injury of the lumbar spine.   Electronically Signed  By: Julaine Blanch M.D.   On: 04/27/2023 07:23 Note: Reviewed        Physical Exam  Vitals: BP 130/80 (BP Location: Right Arm, Patient Position: Sitting, Cuff Size: Normal)   Pulse 92   Temp (!) 97.3 F (36.3 C) (Temporal)   Resp 18   Ht 5' 1 (1.549 m)   Wt 190 lb (86.2 kg)   SpO2 96%   BMI 35.90 kg/m  BMI: Estimated body mass index is 35.9 kg/m as calculated from the following:   Height as of this encounter: 5' 1 (1.549 m).   Weight as of this encounter: 190 lb (86.2 kg). Ideal: Ideal body weight: 47.8 kg (105 lb 6.1 oz) Adjusted ideal body weight: 63.2 kg (139 lb 3.6 oz) General appearance: Well nourished, well developed, and well hydrated. In no apparent acute distress Mental status: Alert, oriented x 3 (person, place, & time)       Respiratory: No evidence of acute respiratory distress Eyes: PERLA  Musculoskeletal: + LBP radiated to bilateral legs (L>R) Assessment    Diagnosis Status  1. Chronic low back pain (1ry area of Pain) (Bilateral) (L>R) w/ sciatica (Bilateral)   2. Chronic use of opiate for therapeutic purpose   3. Chronic pain syndrome   4. Migraine with aura and without status migrainosus, not intractable   5. Chronic lower extremity pain (2ry area of Pain) (Bilateral) (R>L)   6. Chronic sacroiliac joint pain (Bilateral) (L>R)   7. Multiple sclerosis    Controlled Controlled Controlled   Updated Problems: Problem  Migraine    Plan of Care  Problem-specific:  Assessment and Plan  Chronic pain syndrome: Patient's pain is controlled with oxycodone  5 mg tablet, will continue on current medication regimen.  Prescribing drug monitoring (PMP) reviewed; findings consistent with the use of prescribed medication and no evidence of narcotic misuse or abuse.  Urine drug screening (UDS) up-to-date.  Schedule follow-up in 90 days for medication management. She was advised to bring her pill bottle to each visit for pill count in order to continue prescribing her pain medication.   Chronic use of opioid for therapeutic purpose: The patient is on medication regimen requiring monitoring.  No side effects or adverse reaction are reported; however I advised her to drink more water to reduce opioid induced constipation.   Chronic low back pain: The patient has a history of multiple sclerosis and is aware of its progressive nature.  She also experiences chronic low back pain.  Imaging revealed disc bulges at L3-L4 L4-L5 and L5-S1, along with mild bilateral facet arthropathy.    Ms. Teliah Herberg has a current medication list which includes the following long-term medication(s): lamotrigine , levetiracetam , meloxicam , metformin, omeprazole, pravastatin , propranolol , sertraline , sitagliptin, naloxone , [START ON 08/15/2024] oxycodone , [START ON 09/14/2024] oxycodone , and [START ON 10/14/2024] oxycodone .  Pharmacotherapy (Medications Ordered): Meds  ordered this encounter  Medications   oxyCODONE  (OXY IR/ROXICODONE ) 5 MG immediate release tablet    Sig: Take 1 tablet (5 mg total) by mouth every 6 (six) hours as needed for severe pain (pain score 7-10). Must last 30 days.    Dispense:  120 tablet    Refill:  0    DO NOT: delete (not duplicate); no partial-fill (will deny script to complete), no refill request (F/U required). DISPENSE: 1 day early if closed on fill date. WARN: No CNS-depressants within 8 hrs of med.   oxyCODONE  (OXY IR/ROXICODONE ) 5 MG immediate release tablet    Sig: Take 1 tablet (5 mg  total) by mouth every 6 (six) hours as needed for severe pain (pain score 7-10). Must last 30 days.    Dispense:  120 tablet    Refill:  0    DO NOT: delete (not duplicate); no partial-fill (will deny script to complete), no refill request (F/U required). DISPENSE: 1 day early if closed on fill date. WARN: No CNS-depressants within 8 hrs of med.   oxyCODONE  (OXY IR/ROXICODONE ) 5 MG immediate release tablet    Sig: Take 1 tablet (5 mg total) by mouth every 6 (six) hours as needed for severe pain (pain score 7-10). Must last 30 days.    Dispense:  120 tablet    Refill:  0    DO NOT: delete (not duplicate); no partial-fill (will deny script to complete), no refill request (F/U required). DISPENSE: 1 day early if closed on fill date. WARN: No CNS-depressants within 8 hrs of med.   naloxone  (NARCAN ) nasal spray 4 mg/0.1 mL    Sig: Place 1 spray into the nose as needed for up to 365 doses (for opioid-induced respiratory depresssion). In case of emergency (overdose), spray once into each nostril. If no response within 3 minutes, repeat application and call 911.    Dispense:  1 each    Refill:  0    Instruct patient in proper use of device.   Ubrogepant 100 MG TABS    Sig: Take 1 tablet (100 mg total) by mouth daily.    Dispense:  30 tablet    Refill:  0   Orders:  No orders of the defined types were placed in this encounter.       Return  in about 3 months (around 11/06/2024) for (F2F), (MM), Emmy Blanch NP.    Recent Visits Date Type Provider Dept  05/13/24 Office Visit Peggy Monk K, NP [facility]  Showing recent visits within past 90 days and meeting all other requirements Today's Visits Date Type Provider Dept  08/06/24 Office Visit Grayer Sproles K, NP [facility]  Showing today's visits and meeting all other requirements Future Appointments Date Type Provider Dept  11/02/24 Appointment Jaxsyn Catalfamo K, NP [facility]  Showing future appointments within next 90 days and meeting all other requirements  I discussed the assessment and treatment plan with the patient. The patient was provided an opportunity to ask questions and all were answered. The patient agreed with the plan and demonstrated an understanding of the instructions.  Patient advised to call back or seek an in-person evaluation if the symptoms or condition worsens. I personally spent a total of 30 minutes in the care of the patient today including preparing to see the patient, getting/reviewing separately obtained history, performing a medically appropriate exam/evaluation, counseling and educating, placing orders, referring and communicating with other health care professionals, documenting clinical information in the EHR, independently interpreting results, communicating results, and coordinating care.   Note by: Emmy MARLA Blanch, NP   Date: 08/06/2024; Time: 2:40 PM

## 2024-08-06 NOTE — Progress Notes (Signed)
Nursing Pain Medication Assessment:  ?Safety precautions to be maintained throughout the outpatient stay will include: orient to surroundings, keep bed in low position, maintain call bell within reach at all times, provide assistance with transfer out of bed and ambulation.  ?Medication Inspection Compliance: Shannon Benitez did not comply with our request to bring her pills to be counted. She was reminded that bringing the medication bottles, even when empty, is a requirement. ? ?Medication: None brought in. ?Pill/Patch Count: None available to be counted. ?Bottle Appearance: No container available. Did not bring bottle(s) to appointment. ?Filled Date: N/A ?Last Medication intake:  Today ?

## 2024-08-31 ENCOUNTER — Telehealth: Payer: Self-pay | Admitting: Oncology

## 2024-08-31 NOTE — Telephone Encounter (Signed)
 Pt called to schedule appts that were missed.   Pt confirmed lab/MD/infusion appt.

## 2024-09-01 ENCOUNTER — Telehealth: Payer: Self-pay | Admitting: Oncology

## 2024-09-01 ENCOUNTER — Other Ambulatory Visit: Payer: Self-pay | Admitting: *Deleted

## 2024-09-01 DIAGNOSIS — E538 Deficiency of other specified B group vitamins: Secondary | ICD-10-CM

## 2024-09-01 DIAGNOSIS — D509 Iron deficiency anemia, unspecified: Secondary | ICD-10-CM

## 2024-09-01 NOTE — Telephone Encounter (Signed)
 This patient left a message with answering service that she wants to schedule a B12 inj. She hasn't had one since 05/13/2024. She is scheduled to ocme see Dr Georgina on 09/08/24.  The number she left is [phone]

## 2024-09-07 ENCOUNTER — Encounter: Payer: Self-pay | Admitting: *Deleted

## 2024-09-08 ENCOUNTER — Inpatient Hospital Stay: Payer: MEDICAID | Admitting: Oncology

## 2024-09-08 ENCOUNTER — Inpatient Hospital Stay: Payer: MEDICAID

## 2024-09-08 ENCOUNTER — Encounter: Payer: Self-pay | Admitting: Oncology

## 2024-09-08 ENCOUNTER — Inpatient Hospital Stay: Payer: MEDICAID | Attending: Internal Medicine

## 2024-09-08 VITALS — BP 123/78 | HR 71 | Temp 97.9°F | Resp 18 | Ht 61.0 in | Wt 207.0 lb

## 2024-09-08 DIAGNOSIS — Z91040 Latex allergy status: Secondary | ICD-10-CM | POA: Insufficient documentation

## 2024-09-08 DIAGNOSIS — Z9842 Cataract extraction status, left eye: Secondary | ICD-10-CM | POA: Insufficient documentation

## 2024-09-08 DIAGNOSIS — G35D Multiple sclerosis, unspecified: Secondary | ICD-10-CM | POA: Insufficient documentation

## 2024-09-08 DIAGNOSIS — Z9841 Cataract extraction status, right eye: Secondary | ICD-10-CM | POA: Insufficient documentation

## 2024-09-08 DIAGNOSIS — D509 Iron deficiency anemia, unspecified: Secondary | ICD-10-CM | POA: Diagnosis not present

## 2024-09-08 DIAGNOSIS — Z885 Allergy status to narcotic agent status: Secondary | ICD-10-CM | POA: Insufficient documentation

## 2024-09-08 DIAGNOSIS — M1990 Unspecified osteoarthritis, unspecified site: Secondary | ICD-10-CM | POA: Insufficient documentation

## 2024-09-08 DIAGNOSIS — Z79899 Other long term (current) drug therapy: Secondary | ICD-10-CM | POA: Diagnosis not present

## 2024-09-08 DIAGNOSIS — Z90710 Acquired absence of both cervix and uterus: Secondary | ICD-10-CM | POA: Insufficient documentation

## 2024-09-08 DIAGNOSIS — E538 Deficiency of other specified B group vitamins: Secondary | ICD-10-CM | POA: Diagnosis not present

## 2024-09-08 DIAGNOSIS — F17290 Nicotine dependence, other tobacco product, uncomplicated: Secondary | ICD-10-CM | POA: Diagnosis not present

## 2024-09-08 LAB — IRON AND TIBC
Iron: 75 ug/dL (ref 28–170)
Saturation Ratios: 18 % (ref 10.4–31.8)
TIBC: 424 ug/dL (ref 250–450)
UIBC: 349 ug/dL

## 2024-09-08 LAB — CBC WITH DIFFERENTIAL/PLATELET
Abs Immature Granulocytes: 0.02 K/uL (ref 0.00–0.07)
Basophils Absolute: 0 K/uL (ref 0.0–0.1)
Basophils Relative: 1 %
Eosinophils Absolute: 0.2 K/uL (ref 0.0–0.5)
Eosinophils Relative: 3 %
HCT: 39.4 % (ref 36.0–46.0)
Hemoglobin: 12.8 g/dL (ref 12.0–15.0)
Immature Granulocytes: 0 %
Lymphocytes Relative: 13 %
Lymphs Abs: 0.6 K/uL — ABNORMAL LOW (ref 0.7–4.0)
MCH: 29 pg (ref 26.0–34.0)
MCHC: 32.5 g/dL (ref 30.0–36.0)
MCV: 89.3 fL (ref 80.0–100.0)
Monocytes Absolute: 0.4 K/uL (ref 0.1–1.0)
Monocytes Relative: 9 %
Neutro Abs: 3.4 K/uL (ref 1.7–7.7)
Neutrophils Relative %: 74 %
Platelets: 238 K/uL (ref 150–400)
RBC: 4.41 MIL/uL (ref 3.87–5.11)
RDW: 15 % (ref 11.5–15.5)
WBC: 4.6 K/uL (ref 4.0–10.5)
nRBC: 0 % (ref 0.0–0.2)

## 2024-09-08 LAB — VITAMIN B12: Vitamin B-12: <150 pg/mL — ABNORMAL LOW (ref 180–914)

## 2024-09-08 LAB — FOLATE: Folate: 4 ng/mL — ABNORMAL LOW (ref >5.9)

## 2024-09-08 LAB — FERRITIN: Ferritin: 21 ng/mL (ref 11–307)

## 2024-09-08 MED ORDER — CYANOCOBALAMIN 1000 MCG/ML IJ SOLN
1000.0000 ug | Freq: Once | INTRAMUSCULAR | Status: AC
  Administered 2024-09-08: 1000 ug via INTRAMUSCULAR
  Filled 2024-09-08: qty 1

## 2024-09-08 NOTE — Progress Notes (Unsigned)
 Patient is out of her potassium and magnesium  pills.

## 2024-09-09 ENCOUNTER — Other Ambulatory Visit: Payer: Self-pay | Admitting: *Deleted

## 2024-09-09 ENCOUNTER — Telehealth: Payer: Self-pay | Admitting: Oncology

## 2024-09-09 ENCOUNTER — Encounter: Payer: Self-pay | Admitting: Internal Medicine

## 2024-09-09 MED ORDER — FOLIC ACID 1 MG PO TABS: 1.0000 mg | ORAL_TABLET | Freq: Every day | ORAL | 3 refills | Status: AC

## 2024-09-09 NOTE — Progress Notes (Signed)
 [facility]  [phone] [phone]  ID: Shannon Benitez OB: [birth year]  MR#: [id number]  RDW#:[id number]  Patient Care Team: Derenda Rockers, MD as PCP - General (Internal Medicine) Jacobo Evalene PARAS, MD as Consulting Physician (Oncology)  CHIEF COMPLAINT: B12 and iron  deficiency anemia.  INTERVAL HISTORY: Patient was last seen by another provider in February 2025.  She returns to clinic today for repeat laboratory work, further evaluation, and consideration of B12 and/or Venofer  treatment.  She has chronic weakness and fatigue.  She reports no new neurologic complaints from her multiple sclerosis.  She denies any recent fevers.  She has a good appetite and denies weight loss.  She has no chest pain, shortness of breath, cough, or hemoptysis.  She denies any nausea, vomiting, constipation, or diarrhea.  She has no urinary complaints.  Patient offers no further specific complaints today.  REVIEW OF SYSTEMS:   Review of Systems  Constitutional:  Positive for malaise/fatigue. Negative for fever and weight loss.  Respiratory: Negative.  Negative for cough, hemoptysis and shortness of breath.   Cardiovascular: Negative.  Negative for chest pain and leg swelling.  Gastrointestinal: Negative.  Negative for abdominal pain, blood in stool and melena.  Genitourinary: Negative.  Negative for frequency.  Musculoskeletal:  Positive for falls. Negative for back pain.  Skin: Negative.  Negative for rash.  Neurological:  Positive for focal weakness and weakness. Negative for dizziness and headaches.  Psychiatric/Behavioral: Negative.  The patient is not nervous/anxious.     As per HPI. Otherwise, a complete review of systems is negative.  PAST MEDICAL HISTORY: Past Medical History:  Diagnosis Date   Acute postoperative pain 04/30/2019   Diabetes mellitus without complication (HCC)    GERD (gastroesophageal reflux disease)    Headache    daily   MS  (multiple sclerosis)    Numbness and tingling of both legs below knees    pt reports secondary to MS   Osteoarthritis    lumbar   Vertigo    last episode opver 18 yrs ago   Wears dentures    full upper and lower    PAST SURGICAL HISTORY: Past Surgical History:  Procedure Laterality Date   ABDOMINAL HYSTERECTOMY     CATARACT EXTRACTION W/PHACO Right 03/18/2019   Procedure: CATARACT EXTRACTION PHACO AND INTRAOCULAR LENS PLACEMENT (IOC) RIGHT;  Surgeon: Myrna Adine Anes, MD;  Location: [facility];  Service: Ophthalmology;  Laterality: Right;  Diabetic - oral meds Latex sensitivity   CATARACT EXTRACTION W/PHACO Left 04/13/2019   Procedure: CATARACT EXTRACTION PHACO AND INTRAOCULAR LENS PLACEMENT (IOC)  LEFT;  Surgeon: Myrna Adine Anes, MD;  Location: [facility];  Service: Ophthalmology;  Laterality: Left;  Diabetic - oral meds   CESAREAN SECTION     TUMOR REMOVAL Left    pt was 53 years old    FAMILY HISTORY: Family History  Adopted: Yes    ADVANCED DIRECTIVES (Y/N):  N  HEALTH MAINTENANCE: Social History   Tobacco Use   Smoking status: Former    Current packs/day: 0.00    Average packs/day: 0.5 packs/day for 18.0 years (9.0 ttl pk-yrs)    Types: Cigarettes    Start date: 12/19/1998    Quit date: 12/19/2016    Years since quitting: 7.7   Smokeless tobacco: Former  [employer]   Vaping status: Every Day   Start date: 02/03/2018   Substances: Flavoring   Devices: Smock  Substance Use Topics   Alcohol use: Never  Colonoscopy:  PAP:  Bone density:  Lipid panel:  Allergies  Allergen Reactions   5-Alpha Reductase Inhibitors    Desvenlafaxine     Allergic reaction   Glatiramer Other (See Comments)    Pt states me gave her cold spells.    Rebif [Interferon Beta-1a] Other (See Comments)    The skin around the injection site turned black   Gramineae Pollens Itching   Latex Itching    Only when gloves are worn by pt   Tramadol  Diarrhea, Nausea  And Vomiting and Anxiety    Current Outpatient Medications  Medication Sig Dispense Refill   Dimethyl Fumarate  240 MG CPDR Use as directed 1 capsule in the mouth or throat 2 (two) times daily.     lamoTRIgine  (LAMICTAL ) 25 MG tablet Take 50 mg by mouth 2 (two) times daily.     levETIRAcetam  (KEPPRA ) 250 MG tablet Take 500 mg by mouth 2 (two) times daily.     loperamide  (IMODIUM  A-D) 2 MG tablet Take 2 mg by mouth as needed for diarrhea or loose stools.     meloxicam  (MOBIC ) 15 MG tablet Take 1 tablet by mouth daily.     metFORMIN (GLUCOPHAGE-XR) 500 MG 24 hr tablet Take 1,000 mg by mouth daily with breakfast.     mirabegron  ER (MYRBETRIQ ) 50 MG TB24 tablet Take 50 mg by mouth at bedtime.     naloxone  (NARCAN ) nasal spray 4 mg/0.1 mL Place 1 spray into the nose as needed for up to 365 doses (for opioid-induced respiratory depresssion). In case of emergency (overdose), spray once into each nostril. If no response within 3 minutes, repeat application and call 911. 1 each 0   omeprazole (PRILOSEC) 40 MG capsule Take 40 mg by mouth daily.     oxyCODONE  (OXY IR/ROXICODONE ) 5 MG immediate release tablet Take 1 tablet (5 mg total) by mouth every 6 (six) hours as needed for severe pain (pain score 7-10). Must last 30 days. 120 tablet 0   [START ON 09/14/2024] oxyCODONE  (OXY IR/ROXICODONE ) 5 MG immediate release tablet Take 1 tablet (5 mg total) by mouth every 6 (six) hours as needed for severe pain (pain score 7-10). Must last 30 days. 120 tablet 0   [START ON 10/14/2024] oxyCODONE  (OXY IR/ROXICODONE ) 5 MG immediate release tablet Take 1 tablet (5 mg total) by mouth every 6 (six) hours as needed for severe pain (pain score 7-10). Must last 30 days. 120 tablet 0   pregabalin  (LYRICA ) 50 MG capsule Take 150 mg by mouth.     primidone  (MYSOLINE ) 50 MG tablet Take 50 mg by mouth.     propranolol  (INDERAL ) 40 MG tablet Take by mouth.     sertraline  (ZOLOFT ) 50 MG tablet Take 1 tablet (50 mg total) by mouth  daily. 30 tablet 2   sitaGLIPtin (JANUVIA) 100 MG tablet Take 1 tablet by mouth daily.     Ubrogepant 100 MG TABS Take 1 tablet (100 mg total) by mouth daily. 30 tablet 0   aspirin-acetaminophen -caffeine  (EXCEDRIN MIGRAINE) 250-250-65 MG tablet Take by mouth every 6 (six) hours as needed for headache. (Patient not taking: Reported on 09/08/2024)     folic acid (FOLVITE) 1 MG tablet Take 1 tablet (1 mg total) by mouth daily. 30 tablet 3   magnesium  oxide (MAG-OX) 400 (240 Mg) MG tablet Take 1 tablet (400 mg total) by mouth daily. (Patient not taking: Reported on 09/08/2024) 30 tablet 0   pravastatin  (PRAVACHOL ) 20 MG tablet Take 1 tablet (20 mg  total) by mouth daily. (Patient not taking: Reported on 09/08/2024) 30 tablet 0   No current facility-administered medications for this visit.    OBJECTIVE: Vitals:   09/08/24 1434  BP: 123/78  Pulse: 71  Resp: 18  Temp: 97.9 F (36.6 C)  SpO2: 100%     Body mass index is 39.11 kg/m.    ECOG FS:1 - Symptomatic but completely ambulatory  General: Well-developed, well-nourished, no acute distress. Eyes: Pink conjunctiva, anicteric sclera. HEENT: Normocephalic, moist mucous membranes. Lungs: No audible wheezing or coughing. Heart: Regular rate and rhythm. Abdomen: Soft, nontender, no obvious distention. Musculoskeletal: No edema, cyanosis, or clubbing. Neuro: Alert, answering all questions appropriately. Cranial nerves grossly intact. Skin: No rashes or petechiae noted. Psych: Normal affect.   LAB RESULTS:  Lab Results  Component Value Date   NA 142 06/04/2024   K 3.8 06/04/2024   CL 107 06/04/2024   CO2 27 06/04/2024   GLUCOSE 133 (H) 06/04/2024   BUN 7 06/04/2024   CREATININE 0.61 06/04/2024   CALCIUM 8.9 06/04/2024   PROT 6.9 06/03/2024   ALBUMIN 2.8 (L) 06/03/2024   AST 22 06/03/2024   ALT 15 06/03/2024   ALKPHOS 77 06/03/2024   BILITOT 0.2 06/03/2024   GFRNONAA >60 06/04/2024   GFRAA >60 09/25/2019    Lab Results   Component Value Date   WBC 4.6 09/08/2024   NEUTROABS 3.4 09/08/2024   HGB 12.8 09/08/2024   HCT 39.4 09/08/2024   MCV 89.3 09/08/2024   PLT 238 09/08/2024   Lab Results  Component Value Date   IRON  75 09/08/2024   TIBC 424 09/08/2024   IRONPCTSAT 18 09/08/2024   Lab Results  Component Value Date   FERRITIN 21 09/08/2024     STUDIES: No results found.  ASSESSMENT: B12 and iron  deficiency anemia  PLAN:    Iron  deficiency anemia: Resolved.  Colonoscopy was previously discussed with the patient declined by reports she has completed Cologuard testing.  She does not require additional IV Venofer  today.  Patient last received treatment on November 14, 2023.  No intervention is needed at this time.  Return to clinic in 6 months with repeat laboratory work and evaluation by APP. B12 deficiency: Patient's B12 levels remain persistently low.  Return to clinic monthly for treatment and then in 6 months as above. Multiple sclerosis: Continue evaluation and treatment at [facility] neurology.  Patient expressed understanding and was in agreement with this plan. She also understands that She can call clinic at any time with any questions, concerns, or complaints.    Evalene JINNY Reusing, MD   09/09/2024 3:58 PM

## 2024-09-09 NOTE — Telephone Encounter (Signed)
 Per in basket message says to-(Please schedule for monthly B12 injections only and follow-up as scheduled). I called the pt, no answer so I left a vm for her to return my call so I can get her b12 injection scheduled.

## 2024-09-10 ENCOUNTER — Emergency Department
Admission: EM | Admit: 2024-09-10 | Discharge: 2024-09-10 | Disposition: A | Discharge status: Home / Self Care | Payer: MEDICAID | Attending: Emergency Medicine | Admitting: Emergency Medicine

## 2024-09-10 ENCOUNTER — Other Ambulatory Visit: Payer: Self-pay

## 2024-09-10 ENCOUNTER — Emergency Department: Payer: MEDICAID

## 2024-09-10 DIAGNOSIS — R519 Headache, unspecified: Secondary | ICD-10-CM | POA: Insufficient documentation

## 2024-09-10 DIAGNOSIS — G8929 Other chronic pain: Secondary | ICD-10-CM

## 2024-09-10 DIAGNOSIS — R200 Anesthesia of skin: Secondary | ICD-10-CM | POA: Diagnosis not present

## 2024-09-10 MED ORDER — METOCLOPRAMIDE HCL 5 MG/ML IJ SOLN
10.0000 mg | Freq: Once | INTRAMUSCULAR | Status: AC
  Administered 2024-09-10: 10 mg via INTRAMUSCULAR
  Filled 2024-09-10: qty 2

## 2024-09-10 MED ORDER — KETOROLAC TROMETHAMINE 30 MG/ML IJ SOLN
30.0000 mg | Freq: Once | INTRAMUSCULAR | Status: AC
  Administered 2024-09-10: 30 mg via INTRAMUSCULAR
  Filled 2024-09-10: qty 1

## 2024-09-10 NOTE — ED Notes (Signed)
 Pt reports going to bed with h/a last night and waking up with same. States woke up at 0500 today and noticed h/a, at 0600 noticed numbness and tingling to left face. Reports general weakness to bilateral arms and legs d/t MS, no focal weakness at this time. Clear speech Reports h/a has improved from Excedrin, reports still feeling numbness.

## 2024-09-10 NOTE — Discharge Instructions (Signed)
 Follow-up with your neurologist.  Return to the ER immediately for new, worsening, or persistent severe headache, facial numbness, drooping of the face, difficulty with speaking, changes in your vision, weakness or numbness in your arms or legs, problems with balance, or any other symptoms that concern you.  You may also return at any time if you change your mind and wish to pursue the MRI in the ED.

## 2024-09-10 NOTE — ED Provider Triage Note (Signed)
 Emergency Medicine Provider Triage Evaluation Note  Shannon Benitez , a 53 y.o. female  was evaluated in triage.  Pt complains of headache for 20 years. Diagnosed with MS 20 years ago. In the last year progressively worsening headaches. Woke up this morning at [facility] with headache and numbness to hairline to jaw line on left side of face. Headache described as pounding. Starts at forehead and wraps around and goes to back of head and neck. Spoke to her neurologist who advised her to seek ED evaluation. Patient admits to having a fall on October 21st. Admits to hitting her head. Did not seek medical evaluation at the time.   Review of Systems  Positive:  Negative: nausea  Physical Exam  BP 130/72 (BP Location: Left Arm)   Pulse 70   Temp 98.8 F (37.1 C) (Oral)   Resp 16   Ht 5' 1 (1.549 m)   Wt 93.8 kg   SpO2 95%   BMI 39.07 kg/m  Gen:   Awake, no distress   Resp:  Normal effort  MSK:   Moves extremities without difficulty  Other:  No facial droop. Normal UE and LE strength.   Medical Decision Making  Medically screening exam initiated at 4:28 PM.  Appropriate orders placed.  Shannon Benitez was informed that the remainder of the evaluation will be completed by another provider, this initial triage assessment does not replace that evaluation, and the importance of remaining in the ED until their evaluation is complete.  Head CT    Shannon Benitez LABOR, PA-C 09/10/24 1637

## 2024-09-10 NOTE — ED Notes (Signed)
 Pt declined dc vitals.

## 2024-09-10 NOTE — ED Provider Notes (Signed)
 [facility] Provider Note    Event Date/Time   First MD Initiated Contact with Patient 09/10/24 1710     (approximate)   History   Headache   HPI  Jatia Musa is a 53 y.o. female with history of MS and chronic headaches who presents with chronic headache not responding to Excedrin today, as well as left-sided facial numbness that she noted around 6 AM and which is now improving.  The patient reports that the headache has been present for about 9 months, usually occurs in the front of her head and then radiates to the top and back of her head.  It varies in intensity.  She is being evaluated by neurology and has been referred to a headache specialist.  She took Excedrin today without relief.  There is nothing new or different about the headache today compared to what she has experienced previously.  However, the patient noted left-sided facial numbness since around 6 AM today.  She states that it has mostly improved.  She had some surgery in the past on her left side of her neck which has caused intermittent numbness, and she also has MS.  She called and talked to the nurse at her neurologist office today and was told to come to the ED to rule out a stroke.  I reviewed the past medical records.  The patient had a note from her neurologist on 11/2 regarding a referral to the headache team at [facility] for her ongoing headaches.  Her prior most recent neurology encounter was on 10/8 for evaluation of MS.  Physical Exam   Triage Vital Signs: ED Triage Vitals  Encounter Vitals Group     BP 09/10/24 1446 130/72     Girls Systolic BP Percentile --      Girls Diastolic BP Percentile --      Boys Systolic BP Percentile --      Boys Diastolic BP Percentile --      Pulse Rate 09/10/24 1446 70     Resp 09/10/24 1446 16     Temp 09/10/24 1446 98.8 F (37.1 C)     Temp Source 09/10/24 1446 Oral     SpO2 09/10/24 1446 95 %     Weight  09/10/24 1447 206 lb 12.7 oz (93.8 kg)     Height 09/10/24 1447 5' 1 (1.549 m)     Head Circumference --      Peak Flow --      Pain Score 09/10/24 1447 8     Pain Loc --      Pain Education --      Exclude from Growth Chart --     Most recent vital signs: Vitals:   09/10/24 1446  BP: 130/72  Pulse: 70  Resp: 16  Temp: 98.8 F (37.1 C)  SpO2: 95%     General: Awake, no distress.  CV:  Good peripheral perfusion.  Resp:  Normal effort.  Abd:  No distention.  Other:  EOMI.  PERRLA.  No photophobia.  No facial droop (patient has a slight facial symmetry with the left eyebrow somewhat higher than the right, but with movement her face is symmetrical and the patient and her son have not noticed any change from baseline).  Left maxillary area V2 distribution slight numbness, V1 and V3 symmetrical.  5/5 motor strength and intact sensation all extremities.  No ataxia on finger-nose.  No pronator drift.  Normal speech.   ED Results /  Procedures / Treatments   Labs (all labs ordered are listed, but only abnormal results are displayed) Labs Reviewed - No data to display   EKG     RADIOLOGY  CT head: I independently viewed and interpreted the images; there is no ICH  CT cervical spine: No acute fracture  PROCEDURES:  Critical Care performed: No  Procedures   MEDICATIONS ORDERED IN ED: Medications  metoCLOPramide (REGLAN) injection 10 mg (has no administration in time range)  ketorolac  (TORADOL ) 30 MG/ML injection 30 mg (has no administration in time range)     IMPRESSION / MDM / ASSESSMENT AND PLAN / ED COURSE  I reviewed the triage vital signs and the nursing notes.  53 year old female with PMH as noted above presents with a chronic headache as well as with left-sided patient with numbness.  On exam she is well-appearing, her vital signs are normal, and she has decreased sensation to the V2 distribution of the trigeminal nerve on the left, but no other neurologic  deficits.  She states that the numbness is significantly improved over the day.  She feels that coming to the ED was overkill and would like to go home.  CT head and cervical spine were obtained from triage and are negative.  Differential diagnosis includes, but is not limited to, exacerbation of chronic headache, trigeminal neuralgia, MS related symptoms, less likely CVA or TIA.  Patient's presentation is most consistent with acute presentation with potential threat to life or bodily function.  I had an extensive discussion with the patient about her symptoms, her exam, the CT results, and the plan of care.  I advised that while CVA is unlikely given the limited nature of her symptoms and her negative CT, I cannot completely rule this out without doing an MRI.  Risks of a CVA or TIA include permanent disability or death.  However, the patient states that she is feeling better, primarily came here because the neurology nurse told her to, and would like to go home.  She just wants something for the headache.  She understands and is able to paraphrase this respect to me.  She has full decision-making capacity.   The patient is stable for discharge home.  I gave her strict return precautions, and she expressed understanding.  She understands she also may return at any time if she changes her mind and wishes to pursue the MRI.  FINAL CLINICAL IMPRESSION(S) / ED DIAGNOSES   Final diagnoses:  Chronic nonintractable headache, unspecified headache type  Left facial numbness     Rx / DC Orders   ED Discharge Orders     None        Note:  This document was prepared using Dragon voice recognition software and may include unintentional dictation errors.    Jacolyn Pae, MD 09/10/24 [phone]

## 2024-09-10 NOTE — ED Triage Notes (Signed)
 C/O headache for years.Presents to ED today c/o since 0500. Pain to forehead radiating around head bilaterally and down neck.  C/O left facial numbness from hairline to jaw since waking up this morning.  6 Excedrin migraines taken today,. No relief.  AAOx3. Skin warm and dry. NAD. MAE equally and strong. Speech clear. Equal facial movements.

## 2024-09-10 NOTE — Telephone Encounter (Signed)
 Pt. Contacted the clinic via my chart wanting Dr. Lesta to prescribe something for her continued headaches.   Called pt. To inquire more about her headaches and to inform patient that Dr. Lesta doesn't specialize in headaches. She has been trying to get her scheduled with the headache specialist. Upon assessing the patient on her headaches, she described it as having started about 6 months ago but getting worse in intensity and more frequent.  She notes that with the headaches, she does report blurred and double vision which resolve after the headache resolves.  She is now reporting a new symptom of numbness starting from the hairline radiating down the left side of her face which stops at the jaw.  She is stating that currently this sensation has been going on for about 4-5 hours with the headache of 8/10. She states the pain is at the top of her head, wraps around her temples and goes down her neck and back. The headaches are getting more frequent and intense.  She states she has photophobia with them.   Informed pt. To please call 911 to take her to the emergency room to be evaluated as she is reporting stroke like symptoms with new onset numbness down the left side of her face with headaches increasing in frequency and intensity.    Pt. Wanted this RN to speak to her son to explain this to him. Her son listened and felt that is would be good to proceed to the ED.  Pt. This verbalized understanding.

## 2024-10-09 ENCOUNTER — Inpatient Hospital Stay: Payer: MEDICAID | Attending: Internal Medicine

## 2024-10-09 DIAGNOSIS — D509 Iron deficiency anemia, unspecified: Secondary | ICD-10-CM | POA: Diagnosis present

## 2024-10-09 DIAGNOSIS — Z79899 Other long term (current) drug therapy: Secondary | ICD-10-CM | POA: Diagnosis not present

## 2024-10-09 MED ORDER — CYANOCOBALAMIN 1000 MCG/ML IJ SOLN
1000.0000 ug | Freq: Once | INTRAMUSCULAR | Status: AC
  Administered 2024-10-09: 1000 ug via INTRAMUSCULAR
  Filled 2024-10-09: qty 1

## 2024-10-14 ENCOUNTER — Telehealth: Payer: Self-pay | Admitting: Pain Medicine

## 2024-10-14 NOTE — Telephone Encounter (Signed)
 PA completed.

## 2024-10-14 NOTE — Telephone Encounter (Signed)
 PT stated that the Oxycodone  that was prescribe needs a prior auth.
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
## 2024-11-02 ENCOUNTER — Other Ambulatory Visit: Payer: Self-pay

## 2024-11-02 ENCOUNTER — Encounter: Payer: Self-pay | Admitting: Nurse Practitioner

## 2024-11-02 ENCOUNTER — Ambulatory Visit: Payer: MEDICAID | Attending: Nurse Practitioner | Admitting: Nurse Practitioner

## 2024-11-02 VITALS — BP 107/66 | HR 64 | Temp 97.0°F | Resp 16 | Ht 60.0 in | Wt 185.0 lb

## 2024-11-02 DIAGNOSIS — G8929 Other chronic pain: Secondary | ICD-10-CM | POA: Insufficient documentation

## 2024-11-02 DIAGNOSIS — G894 Chronic pain syndrome: Secondary | ICD-10-CM | POA: Diagnosis not present

## 2024-11-02 DIAGNOSIS — M79605 Pain in left leg: Secondary | ICD-10-CM | POA: Insufficient documentation

## 2024-11-02 DIAGNOSIS — Z79891 Long term (current) use of opiate analgesic: Secondary | ICD-10-CM | POA: Insufficient documentation

## 2024-11-02 DIAGNOSIS — G43109 Migraine with aura, not intractable, without status migrainosus: Secondary | ICD-10-CM | POA: Insufficient documentation

## 2024-11-02 DIAGNOSIS — Z79899 Other long term (current) drug therapy: Secondary | ICD-10-CM | POA: Diagnosis not present

## 2024-11-02 DIAGNOSIS — M533 Sacrococcygeal disorders, not elsewhere classified: Secondary | ICD-10-CM | POA: Diagnosis not present

## 2024-11-02 DIAGNOSIS — M79604 Pain in right leg: Secondary | ICD-10-CM | POA: Diagnosis not present

## 2024-11-02 DIAGNOSIS — G35D Multiple sclerosis, unspecified: Secondary | ICD-10-CM | POA: Diagnosis not present

## 2024-11-02 DIAGNOSIS — M5441 Lumbago with sciatica, right side: Secondary | ICD-10-CM | POA: Diagnosis not present

## 2024-11-02 DIAGNOSIS — M5442 Lumbago with sciatica, left side: Secondary | ICD-10-CM | POA: Diagnosis not present

## 2024-11-02 MED ORDER — OXYCODONE HCL 5 MG PO TABS: 5.0000 mg | ORAL_TABLET | Freq: Four times a day (QID) | ORAL | 0 refills | Status: AC | PRN

## 2024-11-02 MED ORDER — UBROGEPANT 100 MG PO TABS: 1.0000 | ORAL_TABLET | Freq: Every day | ORAL | 0 refills | Status: AC

## 2024-11-02 NOTE — Progress Notes (Signed)
 Nursing Pain Medication Assessment:  Safety precautions to be maintained throughout the outpatient stay will include: orient to surroundings, keep bed in low position, maintain call bell within reach at all times, provide assistance with transfer out of bed and ambulation.  Medication Inspection Compliance: Pill count conducted under aseptic conditions, in front of the patient. Neither the pills nor the bottle was removed from the patient's sight at any time. Once count was completed pills were immediately returned to the patient in their original bottle.  Medication: Oxycodone  IR Pill/Patch Count: 52 of 120 pills/patches remain Pill/Patch Appearance: Markings consistent with prescribed medication Bottle Appearance: Standard pharmacy container. Clearly labeled. Filled Date: 61 / 10 / 2025 Last Medication intake:  Today

## 2024-11-02 NOTE — Telephone Encounter (Signed)
 PA done via [facility] for Ubrelvy  Key: [id number]. Spoke to [facility] about medication, they are unable to get medication, Patient wants the script to go to [facility] in [place].

## 2024-11-04 ENCOUNTER — Encounter: Payer: Self-pay | Admitting: Nurse Practitioner

## 2024-11-09 ENCOUNTER — Telehealth: Payer: Self-pay

## 2024-11-09 ENCOUNTER — Other Ambulatory Visit: Payer: Self-pay | Admitting: Nurse Practitioner

## 2024-11-09 DIAGNOSIS — G43109 Migraine with aura, not intractable, without status migrainosus: Secondary | ICD-10-CM

## 2024-11-09 MED ORDER — TOPIRAMATE 50 MG PO TABS: 50.0000 mg | ORAL_TABLET | Freq: Two times a day (BID) | ORAL | 0 refills | Status: DC

## 2024-11-09 NOTE — Telephone Encounter (Signed)
 Patient's Ubrelvy  was denied by the insurance. She has not tried and Failed 2 alternative medications. Please advise.

## 2024-11-09 NOTE — Telephone Encounter (Signed)
"  Patient notified   "

## 2024-11-11 ENCOUNTER — Telehealth: Payer: Self-pay | Admitting: Oncology

## 2024-11-11 NOTE — Telephone Encounter (Signed)
 Pt called to r/s injection from 1/9 - had another dr appt come up that she needed - pt requested to push injection back 1 week - I offered sooner appts, but she refused and said she wanted to do the appt the following Friday - pt confirmed date/time - LH

## 2024-11-13 ENCOUNTER — Inpatient Hospital Stay: Payer: MEDICAID

## 2024-11-20 ENCOUNTER — Encounter: Payer: Self-pay | Admitting: Emergency Medicine

## 2024-11-20 ENCOUNTER — Emergency Department: Payer: MEDICAID

## 2024-11-20 ENCOUNTER — Emergency Department
Admission: EM | Admit: 2024-11-20 | Discharge: 2024-11-20 | Disposition: A | Discharge status: Home / Self Care | Payer: MEDICAID | Attending: Emergency Medicine | Admitting: Emergency Medicine

## 2024-11-20 ENCOUNTER — Other Ambulatory Visit: Payer: Self-pay

## 2024-11-20 ENCOUNTER — Inpatient Hospital Stay: Payer: MEDICAID | Attending: Internal Medicine

## 2024-11-20 DIAGNOSIS — E860 Dehydration: Secondary | ICD-10-CM | POA: Diagnosis not present

## 2024-11-20 DIAGNOSIS — W1830XA Fall on same level, unspecified, initial encounter: Secondary | ICD-10-CM | POA: Diagnosis not present

## 2024-11-20 DIAGNOSIS — E119 Type 2 diabetes mellitus without complications: Secondary | ICD-10-CM | POA: Diagnosis not present

## 2024-11-20 DIAGNOSIS — Y92009 Unspecified place in unspecified non-institutional (private) residence as the place of occurrence of the external cause: Secondary | ICD-10-CM | POA: Diagnosis not present

## 2024-11-20 DIAGNOSIS — G35D Multiple sclerosis, unspecified: Secondary | ICD-10-CM | POA: Insufficient documentation

## 2024-11-20 DIAGNOSIS — R0789 Other chest pain: Secondary | ICD-10-CM | POA: Diagnosis not present

## 2024-11-20 DIAGNOSIS — S0083XA Contusion of other part of head, initial encounter: Secondary | ICD-10-CM | POA: Insufficient documentation

## 2024-11-20 DIAGNOSIS — S0990XA Unspecified injury of head, initial encounter: Secondary | ICD-10-CM | POA: Diagnosis present

## 2024-11-20 LAB — CBC
HCT: 41.2 % (ref 36.0–46.0)
Hemoglobin: 13.3 g/dL (ref 12.0–15.0)
MCH: 28.9 pg (ref 26.0–34.0)
MCHC: 32.3 g/dL (ref 30.0–36.0)
MCV: 89.6 fL (ref 80.0–100.0)
Platelets: 206 K/uL (ref 150–400)
RBC: 4.6 MIL/uL (ref 3.87–5.11)
RDW: 14.6 % (ref 11.5–15.5)
WBC: 10.4 K/uL (ref 4.0–10.5)
nRBC: 0 % (ref 0.0–0.2)

## 2024-11-20 LAB — COMPREHENSIVE METABOLIC PANEL WITH GFR
ALT: 15 U/L (ref 0–44)
AST: 24 U/L (ref 15–41)
Albumin: 4.1 g/dL (ref 3.5–5.0)
Alkaline Phosphatase: 107 U/L (ref 38–126)
Anion gap: 11 (ref 5–15)
BUN: 17 mg/dL (ref 6–20)
CO2: 22 mmol/L (ref 22–32)
Calcium: 10 mg/dL (ref 8.9–10.3)
Chloride: 103 mmol/L (ref 98–111)
Creatinine, Ser: 1.11 mg/dL — ABNORMAL HIGH (ref 0.44–1.00)
GFR, Estimated: 59 mL/min — ABNORMAL LOW
Glucose, Bld: 129 mg/dL — ABNORMAL HIGH (ref 70–99)
Potassium: 3.9 mmol/L (ref 3.5–5.1)
Sodium: 136 mmol/L (ref 135–145)
Total Bilirubin: 0.4 mg/dL (ref 0.0–1.2)
Total Protein: 7.7 g/dL (ref 6.5–8.1)

## 2024-11-20 MED ORDER — SODIUM CHLORIDE 0.9 % IV BOLUS
1000.0000 mL | Freq: Once | INTRAVENOUS | Status: AC
  Administered 2024-11-20: 1000 mL via INTRAVENOUS

## 2024-11-20 NOTE — ED Notes (Signed)
 See triage note  Presents s/p fall  States she has had couple of falls  this weeks  [place]lem[place] on Monday  Hitting her head  The fell again this am  Landed on her buttocks

## 2024-11-20 NOTE — ED Notes (Signed)
 Pt is hypotensive; reports taking oxycodone , tramadol  and 2 propranolol  PTA; pt denies any dizziness, SHOB or CP; will initiate SL and place pt in recliner; family with pt and instructed not to get up without assist

## 2024-11-20 NOTE — ED Notes (Signed)
 Patient transported to X-ray

## 2024-11-20 NOTE — ED Provider Notes (Addendum)
 "  [facility] Provider Note    Event Date/Time   First MD Initiated Contact with Patient 11/20/24 [phone]     (approximate)   History   Chief Complaint: Fall   HPI  Shannon Benitez is a 54 y.o. female with a history of diabetes, GERD, multiple sclerosis with frequent falls who comes ED after a fall at home.  Reports being in her usual state of health, having a mechanical fall which is typical for her.  Complains of some pain in the bilateral chest wall after the fall as well as at the pelvis.  Also hit her head.  No neck pain.  Denies any preceding symptoms, no dizziness/syncope.  Does report that she is fasting to try and lose weight.        Past Medical History:  Diagnosis Date   Acute postoperative pain 04/30/2019   Diabetes mellitus without complication (HCC)    GERD (gastroesophageal reflux disease)    Headache    daily   MS (multiple sclerosis)    Numbness and tingling of both legs below knees    pt reports secondary to MS   Osteoarthritis    lumbar   Vertigo    last episode opver 18 yrs ago   Wears dentures    full upper and lower    Current Outpatient Rx   Order #: [id number] Class: Historical Med   Order #: [id number] Class: Historical Med   Order #: [id number] Class: Normal   Order #: [id number] Class: Historical Med   Order #: [id number] Class: Historical Med   Order #: [id number] Class: Historical Med   Order #: [id number] Class: Normal   Order #: [id number] Class: Historical Med   Order #: [id number] Class: Historical Med   Order #: [id number] Class: Historical Med   Order #: [id number] Class: Normal   Order #: [id number] Class: Historical Med   Order #: [id number] Class: Normal   [START ON 12/13/2024] Order #: [id number] Class: Normal   [START ON 01/12/2025] Order #: [id number] Class: Normal   Order #: [id number] Class: Historical Med   Order #: [id number] Class: Historical Med   Order #: [id number] Class: Normal   Order #: [id number] Class: Historical Med    Order #: [id number] Class: Normal   Order #: [id number] Class: Normal    Past Surgical History:  Procedure Laterality Date   ABDOMINAL HYSTERECTOMY     CATARACT EXTRACTION W/PHACO Right 03/18/2019   Procedure: CATARACT EXTRACTION PHACO AND INTRAOCULAR LENS PLACEMENT (IOC) RIGHT;  Surgeon: Myrna Adine Anes, MD;  Location: [facility];  Service: Ophthalmology;  Laterality: Right;  Diabetic - oral meds Latex sensitivity   CATARACT EXTRACTION W/PHACO Left 04/13/2019   Procedure: CATARACT EXTRACTION PHACO AND INTRAOCULAR LENS PLACEMENT (IOC)  LEFT;  Surgeon: Myrna Adine Anes, MD;  Location: [facility];  Service: Ophthalmology;  Laterality: Left;  Diabetic - oral meds   CESAREAN SECTION     TUMOR REMOVAL Left    pt was 54 years old    Physical Exam   Triage Vital Signs: ED Triage Vitals  Encounter Vitals Group     BP 11/20/24 0652 (!) 73/49     Girls Systolic BP Percentile --      Girls Diastolic BP Percentile --      Boys Systolic BP Percentile --      Boys Diastolic BP Percentile --      Pulse Rate 11/20/24 0652 94     Resp 11/20/24 0652 18     Temp 11/20/24 0652 98.7 F (37.1 C)     Temp Source 11/20/24 0652 Oral  SpO2 11/20/24 0652 94 %     Weight 11/20/24 0639 184 lb 15.5 oz (83.9 kg)     Height 11/20/24 0639 5' (1.524 m)     Head Circumference --      Peak Flow --      Pain Score 11/20/24 0638 6     Pain Loc --      Pain Education --      Exclude from Growth Chart --     Most recent vital signs: Vitals:   11/20/24 1042 11/20/24 1052  BP: (!) 98/50   Pulse: 80   Resp: 16   Temp:  98 F (36.7 C)  SpO2: 99%     General: Awake, no distress.  Dry oral mucosa CV:  Good peripheral perfusion.  Regular rate rhythm Resp:  Normal effort.  Clear lungs Abd:  No distention.  Soft nontender Other:  Left facial swelling.  No ecchymosis.  No bony point tenderness.  No midline spinal tenderness.  There is tenderness along the right lateral chest wall without  crepitus.  No subcutaneous emphysema.  Hips and pelvis are nontender.   ED Results / Procedures / Treatments   Labs (all labs ordered are listed, but only abnormal results are displayed) Labs Reviewed  COMPREHENSIVE METABOLIC PANEL WITH GFR - Abnormal; Notable for the following components:      Result Value   Glucose, Bld 129 (*)    Creatinine, Ser 1.11 (*)    GFR, Estimated 59 (*)    All other components within normal limits  CBC  URINALYSIS, ROUTINE W REFLEX MICROSCOPIC     EKG Interpreted by me Normal sinus rhythm rate of 79.  Normal axis and intervals.  Normal QRS ST segments and T waves   RADIOLOGY CT head interpreted by me, no intracranial hemorrhage.  Radiology report reviewed. CT cervical spine and maxillofacial unremarkable    PROCEDURES:  Procedures   MEDICATIONS ORDERED IN ED: Medications  sodium chloride  0.9 % bolus 1,000 mL (0 mLs Intravenous Stopped 11/20/24 1042)     IMPRESSION / MDM / ASSESSMENT AND PLAN / ED COURSE  I reviewed the triage vital signs and the nursing notes.  DDx: Pneumothorax, rib fracture, chest wall contusion, facial contusion, pelvis fracture, AKI, electrolyte derangement  Patient's presentation is most consistent with acute presentation with potential threat to life or bodily function.  Patient presents with mechanical fall, which is a recurrent issue for her secondary to multiple sclerosis.  Overall nontoxic.  Denies any acute symptoms.  Blood pressure is low.  She does appear to be dehydrated secondary to intentional fasting.  Also takes propranolol  which she did take this morning.  Doubt infection or cardiogenic etiology.  Will give saline bolus and reassess.    ----------------------------------------- 11:16 AM on 11/20/2024 ----------------------------------------- Ambulatory, asymptomatic.  Blood pressure improved.  Stable for discharge     FINAL CLINICAL IMPRESSION(S) / ED DIAGNOSES   Final diagnoses:  Multiple  sclerosis  Contusion of face, initial encounter  Dehydration  Type 2 diabetes mellitus without complication, without long-term current use of insulin  (HCC)     Rx / DC Orders   ED Discharge Orders     None        Note:  This document was prepared using Dragon voice recognition software and may include unintentional dictation errors.   Viviann Pastor, MD 11/20/24 [id number]    Viviann Pastor, MD 11/20/24 1116  "

## 2024-11-20 NOTE — ED Triage Notes (Addendum)
 Pt to triage via w/c with no distress noted; pt reports falling on Monday after tripping on threshold and hitting head and face on ground; denies LOC; st this morning she fell on her bottom; c/o pain tailbone and left and rt sided ribcage pain, head and left side face

## 2024-12-07 ENCOUNTER — Telehealth: Payer: Self-pay

## 2024-12-07 ENCOUNTER — Other Ambulatory Visit: Payer: Self-pay | Admitting: Nurse Practitioner

## 2024-12-07 DIAGNOSIS — G43109 Migraine with aura, not intractable, without status migrainosus: Secondary | ICD-10-CM

## 2024-12-18 ENCOUNTER — Inpatient Hospital Stay: Payer: MEDICAID

## 2025-01-15 ENCOUNTER — Inpatient Hospital Stay: Payer: MEDICAID

## 2025-01-26 ENCOUNTER — Encounter: Payer: MEDICAID | Admitting: Nurse Practitioner

## 2025-02-19 ENCOUNTER — Inpatient Hospital Stay: Payer: MEDICAID

## 2025-03-09 ENCOUNTER — Inpatient Hospital Stay: Payer: MEDICAID

## 2025-03-10 ENCOUNTER — Inpatient Hospital Stay: Payer: MEDICAID

## 2025-03-10 ENCOUNTER — Inpatient Hospital Stay: Payer: MEDICAID | Admitting: Nurse Practitioner
